# Patient Record
Sex: Female | Born: 1935
Health system: Southern US, Community
[De-identification: ages and names within clinical notes are randomized; demographics above are authoritative.]

## PROBLEM LIST (undated history)

## (undated) DIAGNOSIS — E039 Hypothyroidism, unspecified: Secondary | ICD-10-CM

## (undated) DIAGNOSIS — R569 Unspecified convulsions: Secondary | ICD-10-CM

## (undated) DIAGNOSIS — M1990 Unspecified osteoarthritis, unspecified site: Secondary | ICD-10-CM

## (undated) DIAGNOSIS — E0590 Thyrotoxicosis, unspecified without thyrotoxic crisis or storm: Secondary | ICD-10-CM

## (undated) DIAGNOSIS — G253 Myoclonus: Secondary | ICD-10-CM

## (undated) DIAGNOSIS — M6282 Rhabdomyolysis: Secondary | ICD-10-CM

## (undated) DIAGNOSIS — J189 Pneumonia, unspecified organism: Secondary | ICD-10-CM

## (undated) DIAGNOSIS — G43909 Migraine, unspecified, not intractable, without status migrainosus: Secondary | ICD-10-CM

## (undated) DIAGNOSIS — R269 Unspecified abnormalities of gait and mobility: Secondary | ICD-10-CM

## (undated) DIAGNOSIS — I10 Essential (primary) hypertension: Secondary | ICD-10-CM

## (undated) DIAGNOSIS — I2510 Atherosclerotic heart disease of native coronary artery without angina pectoris: Secondary | ICD-10-CM

## (undated) DIAGNOSIS — M47817 Spondylosis without myelopathy or radiculopathy, lumbosacral region: Secondary | ICD-10-CM

## (undated) DIAGNOSIS — E079 Disorder of thyroid, unspecified: Secondary | ICD-10-CM

## (undated) DIAGNOSIS — I639 Cerebral infarction, unspecified: Secondary | ICD-10-CM

## (undated) DIAGNOSIS — E785 Hyperlipidemia, unspecified: Secondary | ICD-10-CM

## (undated) DIAGNOSIS — I219 Acute myocardial infarction, unspecified: Secondary | ICD-10-CM

## (undated) DIAGNOSIS — Z8673 Personal history of transient ischemic attack (TIA), and cerebral infarction without residual deficits: Secondary | ICD-10-CM

## (undated) DIAGNOSIS — G9341 Metabolic encephalopathy: Secondary | ICD-10-CM

## (undated) DIAGNOSIS — Z923 Personal history of irradiation: Secondary | ICD-10-CM

## (undated) DIAGNOSIS — E669 Obesity, unspecified: Secondary | ICD-10-CM

## (undated) DIAGNOSIS — N3091 Cystitis, unspecified with hematuria: Secondary | ICD-10-CM

## (undated) HISTORY — DX: Personal history of irradiation: Z92.3

## (undated) HISTORY — PX: BACK SURGERY: SHX140

## (undated) HISTORY — DX: Atherosclerotic heart disease of native coronary artery without angina pectoris: I25.10

## (undated) HISTORY — DX: Thyrotoxicosis, unspecified without thyrotoxic crisis or storm: E05.90

## (undated) HISTORY — DX: Myoclonus: G25.3

## (undated) HISTORY — DX: Hyperlipidemia, unspecified: E78.5

## (undated) HISTORY — DX: Spondylosis without myelopathy or radiculopathy, lumbosacral region: M47.817

## (undated) HISTORY — DX: Personal history of transient ischemic attack (TIA), and cerebral infarction without residual deficits: Z86.73

## (undated) HISTORY — DX: Unspecified abnormalities of gait and mobility: R26.9

## (undated) HISTORY — DX: Unspecified convulsions: R56.9

## (undated) HISTORY — DX: Obesity, unspecified: E66.9

## (undated) HISTORY — PX: APPENDECTOMY: SHX54

## (undated) HISTORY — DX: Essential (primary) hypertension: I10

## (undated) HISTORY — PX: LUMBAR DISC SURGERY: SHX700

## (undated) HISTORY — PX: CATARACT EXTRACTION W/ INTRAOCULAR LENS  IMPLANT, BILATERAL: SHX1307

## (undated) HISTORY — DX: Disorder of thyroid, unspecified: E07.9

## (undated) HISTORY — PX: TONSILLECTOMY: SUR1361

---

## 1970-06-28 HISTORY — PX: ABDOMINAL HYSTERECTOMY: SHX81

## 1994-06-28 HISTORY — PX: BREAST BIOPSY: SHX20

## 1995-11-28 ENCOUNTER — Encounter: Payer: Self-pay | Admitting: Internal Medicine

## 1997-06-28 HISTORY — PX: OVARY SURGERY: SHX727

## 1997-09-30 ENCOUNTER — Encounter: Payer: Self-pay | Admitting: Internal Medicine

## 1998-11-18 ENCOUNTER — Encounter: Payer: Self-pay | Admitting: Specialist

## 1998-11-18 ENCOUNTER — Ambulatory Visit (HOSPITAL_COMMUNITY): Admission: RE | Admit: 1998-11-18 | Discharge: 1998-11-18 | Payer: Self-pay | Admitting: Specialist

## 1998-12-03 ENCOUNTER — Ambulatory Visit (HOSPITAL_COMMUNITY): Admission: RE | Admit: 1998-12-03 | Discharge: 1998-12-03 | Payer: Self-pay | Admitting: Specialist

## 1998-12-03 ENCOUNTER — Encounter: Payer: Self-pay | Admitting: Specialist

## 1998-12-10 ENCOUNTER — Encounter: Payer: Self-pay | Admitting: Specialist

## 1998-12-10 ENCOUNTER — Ambulatory Visit (HOSPITAL_COMMUNITY): Admission: RE | Admit: 1998-12-10 | Discharge: 1998-12-10 | Payer: Self-pay | Admitting: Specialist

## 1999-01-13 ENCOUNTER — Encounter: Payer: Self-pay | Admitting: Specialist

## 1999-01-15 ENCOUNTER — Encounter: Payer: Self-pay | Admitting: Specialist

## 1999-01-15 ENCOUNTER — Inpatient Hospital Stay (HOSPITAL_COMMUNITY): Admission: RE | Admit: 1999-01-15 | Discharge: 1999-01-17 | Payer: Self-pay | Admitting: Specialist

## 1999-10-08 ENCOUNTER — Encounter: Admission: RE | Admit: 1999-10-08 | Discharge: 1999-10-08 | Payer: Self-pay | Admitting: Internal Medicine

## 1999-10-08 ENCOUNTER — Encounter: Payer: Self-pay | Admitting: Internal Medicine

## 1999-11-09 ENCOUNTER — Other Ambulatory Visit: Admission: RE | Admit: 1999-11-09 | Discharge: 1999-11-09 | Payer: Self-pay | Admitting: Orthopedic Surgery

## 2000-04-20 ENCOUNTER — Ambulatory Visit (HOSPITAL_COMMUNITY): Admission: RE | Admit: 2000-04-20 | Discharge: 2000-04-20 | Payer: Self-pay | Admitting: Specialist

## 2000-04-20 ENCOUNTER — Encounter: Payer: Self-pay | Admitting: Specialist

## 2000-10-10 ENCOUNTER — Encounter: Admission: RE | Admit: 2000-10-10 | Discharge: 2000-10-10 | Payer: Self-pay | Admitting: Internal Medicine

## 2000-10-10 ENCOUNTER — Encounter: Payer: Self-pay | Admitting: Internal Medicine

## 2001-01-15 ENCOUNTER — Emergency Department (HOSPITAL_COMMUNITY): Admission: EM | Admit: 2001-01-15 | Discharge: 2001-01-15 | Payer: Self-pay | Admitting: *Deleted

## 2001-04-29 ENCOUNTER — Encounter: Payer: Self-pay | Admitting: Internal Medicine

## 2001-04-29 ENCOUNTER — Encounter: Admission: RE | Admit: 2001-04-29 | Discharge: 2001-04-29 | Payer: Self-pay | Admitting: Internal Medicine

## 2001-05-04 ENCOUNTER — Encounter: Payer: Self-pay | Admitting: Internal Medicine

## 2001-05-04 ENCOUNTER — Encounter: Admission: RE | Admit: 2001-05-04 | Discharge: 2001-05-04 | Payer: Self-pay | Admitting: Internal Medicine

## 2001-10-10 ENCOUNTER — Encounter: Admission: RE | Admit: 2001-10-10 | Discharge: 2001-10-10 | Payer: Self-pay | Admitting: Internal Medicine

## 2001-10-10 ENCOUNTER — Encounter: Payer: Self-pay | Admitting: Internal Medicine

## 2002-10-18 ENCOUNTER — Encounter: Payer: Self-pay | Admitting: Internal Medicine

## 2002-10-18 ENCOUNTER — Encounter: Admission: RE | Admit: 2002-10-18 | Discharge: 2002-10-18 | Payer: Self-pay | Admitting: Internal Medicine

## 2002-11-09 ENCOUNTER — Encounter: Payer: Self-pay | Admitting: Internal Medicine

## 2002-11-09 ENCOUNTER — Ambulatory Visit (HOSPITAL_COMMUNITY): Admission: RE | Admit: 2002-11-09 | Discharge: 2002-11-09 | Payer: Self-pay | Admitting: Internal Medicine

## 2003-04-22 ENCOUNTER — Ambulatory Visit (HOSPITAL_COMMUNITY): Admission: RE | Admit: 2003-04-22 | Discharge: 2003-04-22 | Payer: Self-pay | Admitting: Unknown Physician Specialty

## 2003-05-29 ENCOUNTER — Inpatient Hospital Stay (HOSPITAL_COMMUNITY): Admission: RE | Admit: 2003-05-29 | Discharge: 2003-05-31 | Payer: Self-pay | Admitting: Specialist

## 2003-10-04 ENCOUNTER — Ambulatory Visit (HOSPITAL_COMMUNITY): Admission: RE | Admit: 2003-10-04 | Discharge: 2003-10-04 | Payer: Self-pay | Admitting: Specialist

## 2003-10-17 ENCOUNTER — Encounter (HOSPITAL_COMMUNITY): Admission: RE | Admit: 2003-10-17 | Discharge: 2003-10-21 | Payer: Self-pay | Admitting: Specialist

## 2003-10-23 ENCOUNTER — Encounter: Admission: RE | Admit: 2003-10-23 | Discharge: 2003-10-23 | Payer: Self-pay | Admitting: Internal Medicine

## 2003-11-27 ENCOUNTER — Ambulatory Visit (HOSPITAL_COMMUNITY): Admission: RE | Admit: 2003-11-27 | Discharge: 2003-11-27 | Payer: Self-pay | Admitting: *Deleted

## 2003-11-27 HISTORY — PX: COLONOSCOPY: SHX174

## 2003-12-08 ENCOUNTER — Inpatient Hospital Stay (HOSPITAL_COMMUNITY): Admission: EM | Admit: 2003-12-08 | Discharge: 2003-12-12 | Payer: Self-pay | Admitting: Emergency Medicine

## 2004-03-09 ENCOUNTER — Encounter: Admission: RE | Admit: 2004-03-09 | Discharge: 2004-03-09 | Payer: Self-pay | Admitting: Orthopaedic Surgery

## 2004-03-28 HISTORY — PX: ANTERIOR CERVICAL DECOMP/DISCECTOMY FUSION: SHX1161

## 2004-04-22 ENCOUNTER — Inpatient Hospital Stay (HOSPITAL_COMMUNITY): Admission: RE | Admit: 2004-04-22 | Discharge: 2004-04-24 | Payer: Self-pay | Admitting: Orthopaedic Surgery

## 2004-10-29 ENCOUNTER — Encounter: Admission: RE | Admit: 2004-10-29 | Discharge: 2004-10-29 | Payer: Self-pay | Admitting: Internal Medicine

## 2004-11-17 ENCOUNTER — Ambulatory Visit (HOSPITAL_COMMUNITY): Admission: RE | Admit: 2004-11-17 | Discharge: 2004-11-17 | Payer: Self-pay | Admitting: Ophthalmology

## 2005-11-01 ENCOUNTER — Encounter: Admission: RE | Admit: 2005-11-01 | Discharge: 2005-11-01 | Payer: Self-pay | Admitting: Internal Medicine

## 2005-12-20 ENCOUNTER — Encounter: Admission: RE | Admit: 2005-12-20 | Discharge: 2005-12-20 | Payer: Self-pay | Admitting: Neurology

## 2005-12-26 DIAGNOSIS — I219 Acute myocardial infarction, unspecified: Secondary | ICD-10-CM

## 2005-12-26 HISTORY — DX: Acute myocardial infarction, unspecified: I21.9

## 2006-01-01 ENCOUNTER — Inpatient Hospital Stay (HOSPITAL_COMMUNITY): Admission: EM | Admit: 2006-01-01 | Discharge: 2006-01-04 | Payer: Self-pay | Admitting: Emergency Medicine

## 2006-01-03 HISTORY — PX: CARDIAC CATHETERIZATION: SHX172

## 2006-06-27 ENCOUNTER — Encounter: Admission: RE | Admit: 2006-06-27 | Discharge: 2006-06-27 | Payer: Self-pay | Admitting: Neurology

## 2006-07-25 ENCOUNTER — Encounter: Admission: RE | Admit: 2006-07-25 | Discharge: 2006-07-25 | Payer: Self-pay | Admitting: Neurology

## 2006-08-10 ENCOUNTER — Encounter: Admission: RE | Admit: 2006-08-10 | Discharge: 2006-08-10 | Payer: Self-pay | Admitting: Neurology

## 2006-08-30 ENCOUNTER — Encounter: Admission: RE | Admit: 2006-08-30 | Discharge: 2006-08-30 | Payer: Self-pay | Admitting: Neurology

## 2006-09-21 ENCOUNTER — Ambulatory Visit (HOSPITAL_COMMUNITY): Admission: RE | Admit: 2006-09-21 | Discharge: 2006-09-21 | Payer: Self-pay | Admitting: Internal Medicine

## 2006-09-27 HISTORY — PX: ESOPHAGOGASTRODUODENOSCOPY: SHX1529

## 2006-10-06 ENCOUNTER — Ambulatory Visit (HOSPITAL_COMMUNITY): Admission: RE | Admit: 2006-10-06 | Discharge: 2006-10-06 | Payer: Self-pay | Admitting: *Deleted

## 2006-10-06 ENCOUNTER — Encounter: Payer: Self-pay | Admitting: Internal Medicine

## 2006-10-06 ENCOUNTER — Encounter (INDEPENDENT_AMBULATORY_CARE_PROVIDER_SITE_OTHER): Payer: Self-pay | Admitting: Specialist

## 2006-11-17 ENCOUNTER — Encounter: Admission: RE | Admit: 2006-11-17 | Discharge: 2006-11-17 | Payer: Self-pay | Admitting: Internal Medicine

## 2007-11-21 ENCOUNTER — Encounter: Admission: RE | Admit: 2007-11-21 | Discharge: 2007-11-21 | Payer: Self-pay | Admitting: Internal Medicine

## 2007-12-01 ENCOUNTER — Encounter: Admission: RE | Admit: 2007-12-01 | Discharge: 2007-12-01 | Payer: Self-pay | Admitting: Internal Medicine

## 2008-01-24 ENCOUNTER — Encounter: Admission: RE | Admit: 2008-01-24 | Discharge: 2008-01-24 | Payer: Self-pay | Admitting: Endocrinology

## 2008-04-12 ENCOUNTER — Ambulatory Visit (HOSPITAL_COMMUNITY): Admission: RE | Admit: 2008-04-12 | Discharge: 2008-04-12 | Payer: Self-pay | Admitting: Endocrinology

## 2008-11-20 ENCOUNTER — Ambulatory Visit (HOSPITAL_COMMUNITY): Admission: RE | Admit: 2008-11-20 | Discharge: 2008-11-20 | Payer: Self-pay | Admitting: Endocrinology

## 2008-11-21 ENCOUNTER — Encounter: Admission: RE | Admit: 2008-11-21 | Discharge: 2008-11-21 | Payer: Self-pay | Admitting: Internal Medicine

## 2009-02-27 ENCOUNTER — Ambulatory Visit: Payer: Self-pay | Admitting: Internal Medicine

## 2009-02-27 DIAGNOSIS — K5900 Constipation, unspecified: Secondary | ICD-10-CM | POA: Insufficient documentation

## 2009-02-27 DIAGNOSIS — Z860100 Personal history of colon polyps, unspecified: Secondary | ICD-10-CM | POA: Insufficient documentation

## 2009-11-25 ENCOUNTER — Encounter: Admission: RE | Admit: 2009-11-25 | Discharge: 2009-11-25 | Payer: Self-pay | Admitting: Internal Medicine

## 2010-04-06 ENCOUNTER — Ambulatory Visit: Payer: Self-pay | Admitting: Cardiovascular Disease

## 2010-07-21 ENCOUNTER — Encounter (HOSPITAL_COMMUNITY)
Admission: RE | Admit: 2010-07-21 | Discharge: 2010-07-28 | Payer: Self-pay | Source: Home / Self Care | Attending: Endocrinology | Admitting: Endocrinology

## 2010-07-27 ENCOUNTER — Other Ambulatory Visit (HOSPITAL_COMMUNITY): Payer: Self-pay | Admitting: Endocrinology

## 2010-07-27 DIAGNOSIS — E0590 Thyrotoxicosis, unspecified without thyrotoxic crisis or storm: Secondary | ICD-10-CM

## 2010-08-03 ENCOUNTER — Ambulatory Visit (HOSPITAL_COMMUNITY)
Admission: RE | Admit: 2010-08-03 | Discharge: 2010-08-03 | Disposition: A | Discharge status: Home / Self Care | Payer: Medicare Other | Source: Ambulatory Visit | Attending: Endocrinology | Admitting: Endocrinology

## 2010-08-03 DIAGNOSIS — E0590 Thyrotoxicosis, unspecified without thyrotoxic crisis or storm: Secondary | ICD-10-CM | POA: Insufficient documentation

## 2010-08-03 MED ORDER — SODIUM IODIDE I 131 CAPSULE
32.0000 | Freq: Once | INTRAVENOUS | Status: AC | PRN
  Administered 2010-08-03: 32 via ORAL

## 2010-10-12 ENCOUNTER — Other Ambulatory Visit: Payer: Self-pay | Admitting: *Deleted

## 2010-10-12 DIAGNOSIS — I2510 Atherosclerotic heart disease of native coronary artery without angina pectoris: Secondary | ICD-10-CM

## 2010-10-12 MED ORDER — ISOSORBIDE MONONITRATE ER 60 MG PO TB24: 60.0000 mg | ORAL_TABLET | ORAL | Status: DC

## 2010-10-20 ENCOUNTER — Other Ambulatory Visit: Payer: Self-pay | Admitting: *Deleted

## 2010-10-20 DIAGNOSIS — I10 Essential (primary) hypertension: Secondary | ICD-10-CM

## 2010-10-20 MED ORDER — METOPROLOL TARTRATE 100 MG PO TABS: 100.0000 mg | ORAL_TABLET | Freq: Two times a day (BID) | ORAL | Status: DC

## 2010-10-20 NOTE — Telephone Encounter (Signed)
escribe medication per fax request  

## 2010-10-27 ENCOUNTER — Other Ambulatory Visit: Payer: Self-pay | Admitting: Internal Medicine

## 2010-10-27 DIAGNOSIS — Z1231 Encounter for screening mammogram for malignant neoplasm of breast: Secondary | ICD-10-CM

## 2010-11-02 ENCOUNTER — Other Ambulatory Visit: Payer: Self-pay | Admitting: Cardiovascular Disease

## 2010-11-03 NOTE — Telephone Encounter (Signed)
Fax received from pharmacy. Refill completed. Jodette Jewett Mcgann RN  

## 2010-11-13 NOTE — H&P (Signed)
NAMEMarland Bass  ANALYS, RYDEN                    ACCOUNT NO.:  [id number]   MEDICAL RECORD NO.:  [id number]                   PATIENT TYPE:  INP   LOCATION:  1843                                 FACILITY:  [facility]   PHYSICIAN:  Hettie Holstein, D.O.                 DATE OF BIRTH:  07-Feb-1926   DATE OF ADMISSION:  12/08/2003  DATE OF DISCHARGE:                                HISTORY & PHYSICAL   PRIMARY CARE PHYSICIAN:  Soyla Murphy. Kimberly Bass, M.D.   CHIEF COMPLAINT:  Can't stand and feeling lightheaded.   HISTORY OF PRESENT ILLNESS:  This is a pleasant 77 year old female with a  history of familial tremor as well severe degenerative spine disease who,  for the past couple of days, has been feeling unwell.  This morning she  awoke feeling nauseated and lightheaded.  She did not vomit; however, she  stood up and felt very weak at the knees and fell to the floor without loss  of consciousness.  Upon transfer to the bed with the assistance of her  husband, she noted some uncontrollable shaking of her legs.  She stated,  almost shook me out of bed.  EMS was dispatched and found her to be  hypertensive with blood pressure 210/110 and subsequently transported her to  [facility] Emergency Department for further evaluation.  She  arrived, ER physician evaluated her, and because she could not stand, called  [place] to evaluate her.  In the emergency department on the ISTAT, she was noted  to have a potassium of 3.2.  No other labs available at the time of this  evaluation and dictation.  In any event, she reported two days of feeling  generally unwell, some nausea without emesis.  No diarrhea or dysuria.  She  had no chest pain or shortness of breath.  No other complaints.  She  reported 30-pound weight loss over the past three months.  This has not been  addressed.   Recently she was treated by Dr. Otelia Sergeant for severe degenerative spine disease.  She underwent back surgery in December 2004 and  subsequently has been taking  muscle relaxants and Sulindac or Clinoril in addition to  hydrochlorothiazide.   She does report a tremor that she has had for years that runs in her family.  She states that she has been evaluated thoroughly by a neurologist in the  past for this; however, she states that these lower extremity shakes and  jerks are completely new to her.   PAST MEDICAL HISTORY:  She denies any known coronary artery disease.  In  fact, she states she had undergone evaluation by [facility] several  years ago with stress test and echocardiogram; however, these are not  available in the chart at this time.  She denies any lung disease, life time  nonsmoker.  Her medical history is significant for longstanding  hypertension.  She does as well have history  of severe degenerative back  disease with recent back surgery in December 2004 by Dr. Otelia Sergeant.  She had  MRIs performed of her T spine as well as L spine in October 2004 revealing  C3 through C7 spinal stenosis as well as L1-L2 and L2-L3 spondylosis as well  as advanced facet degeneration and L4-5 foraminal stenosis.  This was prior  to her operation per Dr. Otelia Sergeant.  She has a history of benign left ovary  tumor resected in 1999 in addition to bilateral salpingo-oophorectomy  performed at that time.  She has had breast nodule in 1996 with right breast  biopsy.  She states that this was benign.  She has undergone hysterectomy in  1972 for uterine fibroid.  She retains her gallbladder; however, she has had  appendectomy as well as tonsillectomy.   MEDICATIONS AT HOME:  1. Atenolol 100 mg p.o. q.a.m., 50 mg p.o. q.p.m.  2. Lisinopril 40 mg p.o. daily.  3. Calcium supplements.  4. Methocarbamol started in December 500 mg t.i.d.  5. Trazodone 500 mg 2 tablets q.h.s.  6. Sulindac 200 mg p.o. b.i.d.  7. Lipitor 10 mg p.o. daily.  8. Hydrochlorothiazide 12.5 mg p.o. daily  9. Multivitamins 1 p.o. daily.   ALLERGIES:  She  states she is allergic to PENICILLIN which causes  anaphylaxis.  She is intolerant to CODEINE which causes nausea in addition  to other narcotic pain medications.   SOCIAL HISTORY:  She is a lifetime nonsmoker.  She lives with her husband.  She has one child.  Recently placed her mother in a nursing home  approximately two months ago which has been quite stressful to her.  Her  mother is 9 years old.   FAMILY HISTORY:  Her mother is alive at 42 years old, recently placed in a  nursing home.  Father is 39 years old and had heart disease in late 47s.  No known cancer in the family.   REVIEW OF SYSTEMS:  No fevers or chills, no night sweats.  Positive nausea,  no vomiting, no coffee ground emesis, no hematochezia or melena.  She has  positive subjective weight loss of approximately 30 pounds over the past  three months.  This has not been addressed.  She has had endoscopies  performed by Dr. Virginia Rochester November 27, 2003, which revealed no colon polyps and  recommendations to followup in 5 to 10 years.  She has had no complaints of  dysuria, diarrhea.  No lower extremity swelling or weakness with exception  of that which is described in the History of Present Illness.   PHYSICAL EXAMINATION:  VITAL SIGNS:  Blood pressure 172/76, heart rate 72,  temperature 97.4, O2 saturation 96%.  GENERAL:  Alert and oriented in no acute distress.  She does have postural  low to moderate-amplitude, low-frequency tremor, about 2 Hz that is most  notable with outstretched hands or turning her neck in fixed position to one  side or the other.  Again, the family reports that this is not new.  HEENT:  Head is normocephalic and atraumatic.  Extraocular muscles are  intact.  Oropharynx is clear.  No temporal wasting is noted.  NECK: Supple, nontender.  No palpable thyromegaly, no palpable  lymphadenopathy.  CARDIOVASCULAR:  Normal S1 and S2 without murmur, rub, or gallop. LUNGS:  Clear to auscultation bilaterally.   ABDOMEN:  Soft and nontender.  Normoactive bowel sounds.  EXTREMITIES:  No edema, no calf tenderness.  She does exhibit random jerks  of her lower extremities when strength testing is undertaken.  NEUROLOGIC:  Myoclonic jerks with muscle stretch.  Her deep tendon reflexes,  however, are within normal limits at +2/4 x 4.  Her strength is +5/5 x 4  with regard to proximal musculature.  However, she is unable to stand due to  being very wobbly in her knees and these recurrent myoclonic jerks.   LABORATORY AND X-RAY DATA:  Point-of-care at 12:23 was negative.  Urinalysis  is negative.  Hemoglobin and hematocrit are normal at 13.9 and 41,  respectively.  These are all taken from the ISTAT drawn in the emergency  department.  Sodium 143, potassium 3.2, BUN 12, creatinine 0.6.   Review of recent imaging studies not performed this admission, however,  reveal MRI study in October 2004, as reported above of her L and C spines as  well as recent hip MRI that revealed some degenerative disease and labral  tear.  This was reviewed by Dr. Otelia Sergeant.   EKG this admission revealed normal sinus rhythm with left axis deviation.   IMPRESSION AND PLAN:  1. Near syncopal event.  Not completely clear as to the etiology by the     history.  She did report some lightheadedness.  Unable to check     orthostatics due to her inability to really stand in the emergency     department.  We will administer gentle IV fluids as she appears     clinically mildly volume depleted.  I will check a formal comprehensive     metabolic panel with magnesium, calcium, and phosphorus to assess for any     electrolyte abnormality that may be contributing to these myoclonic     jerks.  We will check her orthostatics once she is able to stand for [place]     and ambulate her in the hallways with physical therapy.  Will consult     physical therapy in the morning.  2. Myoclonic jerks.  It is unclear as to etiology of this.  It may be      related to electrolyte abnormality.  We are going to check additionally     magnesium and calcium.  We are going to replete her potassium and follow     her electrolytes.  If she continues to have these myoclonic jerks, the     assistance of neurology may be reasonable.  We will withhold her     methocarbamol for now and hold her hydrochlorothiazide as well as calcium     supplements until we are able to review labs.  3. Hypertension.  She is currently on high doses of atenolol as well as     Lisinopril. We will consider increasing her Lisinopril; however, she is     already at 40.  For now, while we are holding her hydrochlorothiazide, I     will place her on p.r.n. hydralazine and will plan on increasing her     Lisinopril if she requires it.  4. Spinal degenerative disease.  At this time, she is stable with regard to     her recent surgery and has no pain or complaints in that regard.  Will    follow her off of her muscle relaxants as well as pain medication for     now.  We will initiate prophylactic low-molecular-weight heparin while     she is immobile.  Will follow her on telemetry for possible arrhythmia     for the next 12 to  24 hours.  If there is none and her symptoms resolve,     she can probably go home.                                                Hettie Holstein, D.O.    ESS/MEDQ  D:  12/08/2003  T:  12/08/2003  Job:  [id number]   cc:   Soyla Murphy. Kimberly Bass, M.D.  [address]  [place]  [place] [address]  Fax: [phone]

## 2010-11-13 NOTE — Discharge Summary (Signed)
NAMEMarland Kitchen  Kimberly Bass, Kimberly Bass                    ACCOUNT NO.:  [id number]   MEDICAL RECORD NO.:  [id number]                   PATIENT TYPE:  INP   LOCATION:  5157                                 FACILITY:  [facility]   PHYSICIAN:  Hettie Holstein, D.O.                 DATE OF BIRTH:  1926/04/12   DATE OF ADMISSION:  12/08/2003  DATE OF DISCHARGE:  12/12/2003                                 DISCHARGE SUMMARY   PRIMARY CARE PHYSICIAN:  Kimberly Bass, M.D.   SPINE SURGEON:  Kerrin Champagne, M.D.   NEUROLOGIST:  Consultation this admission, C. Lesia Sago, M.D.   ADMISSION DIAGNOSES:  1. Questionable near-syncopal event.  2. Myoclonic lower extremity jerks and inability to ambulate.  3. Hypertension.  4. Spinal degenerative disease.   DISCHARGE DIAGNOSES:  1. Myoclonic jerks of the lower extremity, complete etiology unclear, status     post evaluation by neurology and discussion with orthopedic spine     surgeon, with further recommendations for orthopedic spine follow-up with     Dr. Otelia Sergeant on June 22 at 10:30 a.m., status post MRI evaluation this     admission that revealed multilevel severe degenerative disk disease     extending from C3-4 through C6-7 with soft disk herniations at each of     those levels, most prominent C6-7.  The cord was slightly compressed at     C5-6 and C6-7 without underlying myelopathy.  There is multilevel     biforaminal stenosis due to spurs at those levels.  This was conveyed to     orthopedic spine surgeon covering for Dr. Otelia Sergeant on December 11, 2003, and it     was recommended that Dr. Otelia Sergeant see her in follow-up.  An appointment was     scheduled.  2. In addition, she had hypokalemia with admitting potassium of 3.0 and     subsequently replaced, in addition to low normal magnesium, also repleted     this admission.  3. Hyperthyroidism with finding of a low thyroid-stimulating hormone,     elevated T3 and T4.  No overt evidence of exophthalmos noted this  admission.  She is on high doses of a beta blocker, atenolol 100 mg p.o.     b.i.d.  She underwent radionuclide scan and we are currently awaiting the     findings of this test and the treatment prior to discharge home.  It is     recommended that she undergo follow-up with her primary care physician,     Dr. Renne Bass, and if he should wish, he can schedule her to follow up with     an endocrinologist, Dr. Lucianne Muss, who was contacted during his admission.     She will be discharged on PTU and have subsequent follow-up within one     week with her primary care physician.  She will start with 50 mg once     daily.  4.  Near-syncope.  After a thorough questioning, it sounds as though Ms.     Jamison was mildly volume-depleted; however, mainly this was related to     these myoclonic jerks of her lower extremities that were felt to be     uncontrolled.  It was suggested that she may have restless legs by the     neurologist here.  In addition, it may be related to her hyperthyroidism,     in addition to, may be related to her progressed severe spinal     degenerative disease.  5. Hypertension.  6. Degenerative spine disease with recent back surgery in December 2004 by     Dr. Otelia Sergeant, status post MRIs performed of her cervical spine and lumbar     spine in October revealing C3 through C7 spinal stenosis as well as L1-2     and L2-3 spondylosis, as well as advanced facet degeneration and L4-5     foraminal stenosis.  7. She has a history of a benign left ovary resected in 1999 with bilateral     salpingo-oophorectomy.  8. She had a breast nodule in 1996 with a right breast biopsy, which was     benign.  9. Status post hysterectomy in 1972 for uterine fibroids.  10.      She retains her gallbladder; however, she has had an appendectomy     as well as tonsillectomy.   DISCHARGE MEDICATIONS:  1. Atenolol has been increased from 100 to 100 mg twice daily, as this may     be titrated down as her  hyperthyroidism is treated.  2. Lisinopril 40 mg daily.  3. Calcium supplements as before.  4. Multivitamin as before.  5. Trazodone only as needed.  6. She is instructed to stop methocarbamol.  7. She is instructed to continue taking Clinoril twice daily and resume     calcium supplements.  8. Propylthiouracil 50 mg daily and re-evaluation in the outpatient setting.   She is instructed to be assured supervision for the next few days as she is  a risk for falls, and she is instructed to take things slowly and be very  careful and informed of the risks and complications associated with falls at  her age.   DISPOSITION:  She is being discharged home in stable and improved condition.  She is ambulating the halls without difficulty.  She has a follow-up  appointment on June 22, Wednesday, at 10:30 with Dr. Otelia Sergeant as well as June  23, Thursday, at 11:15 a.m. with Dr. Ricki Miller as Dr. Renne Bass will be on vacation.   HISTORY OF PRESENT ILLNESS:  This is a pleasant 77 year old female with a  history of familial tremor as well as severe degenerative spine disease, who  for the past couple of days has been feeling unwell.  The morning of  presentation she awoke feeling nauseated and lightheaded.  She did not  vomit; however, she stood up and felt very weak at the knees and fell to the  floor without loss of consciousness.  Following transfer to the bed with the  assistance of her husband, she noticed some uncontrollable shaking of her  legs.  She stated, these almost shook me out of bed.  EMS was dispatched  and found her to be hypertensive with a blood pressure of 210/110, and she  was subsequently transported to [facility] ER for further evaluation.  She  arrived.  The ER physician evaluated her and because she could not stand,  we  were called to evaluate her.  In the emergency department ISTAT, she was  noted to be hypokalemic with a potassium of 3.2.  No other labs were available at that time.  She  was admitted for further observation and  evaluation.  There was no suggestion of ischemia due to cardiac etiology  based on her clinical history, and she was given some gentle IV fluids.  She  exhibited no orthostasis.   HOSPITAL COURSE:  She was admitted as noted above.  No acute cardiac event  was clinically suspected.  She was followed closely.  Her electrolytes were  repleted, and hypertensive medications were up-titrated.  She was screened  for thyroid, which revealed a depressed TSH, elevated T3 and T4, and  subsequently nuclear medicine was consulted for RAI and she is anticipated  treatment today; however, she has not gone down for that yet.  I suspect  that if this goes without complication, she can go home today.  She has been  ambulating without difficulty here.  She continued to have the myoclonic  jerks.  She was evaluated by neurology, who recommended a referral  appropriate in the outpatient setting with her spine surgeons.  Dr. Barbaraann Faster  office was notified and otherwise she should follow up as noted above.                                                Hettie Holstein, D.O.    ESS/MEDQ  D:  12/12/2003  T:  12/12/2003  Job:  [id number]   cc:   Kimberly Bass, M.D.  [address]  [place]  [place] [address]  Fax: [phone]   Kerrin Champagne, M.D.  [address]  [place]  [place] [address]  Fax: [phone]   C. Lesia Sago, M.D.  [address]  [address]  [place]  [place] [address]  Fax: [phone]

## 2010-11-13 NOTE — Cardiovascular Report (Signed)
NAMEISHA, SEEFELD NO.:  [id number]   MEDICAL RECORD NO.:  [id number]          PATIENT TYPE:  INP   LOCATION:  2035                         FACILITY:  [facility]   PHYSICIAN:  Vesta Mixer, M.D. DATE OF BIRTH:  04/06/1926   DATE OF PROCEDURE:  01/03/2006  DATE OF DISCHARGE:                              CARDIAC CATHETERIZATION   Kimberly Bass is a 77 year old female who was admitted through the  emergency room with some episodes of chest pain and mildly positive cardiac  enzymes.  She is referred for heart catheterization for further evaluation.   PROCEDURE:  Left heart catheterization with coronary angiography.   HEMODYNAMIC RESULTS:  The LV pressure is 129/8 with an aortic pressure of  126/61.   Angiography, left main.  The left main is fairly smooth and normal.   The left anterior descending artery is smooth and normal.  The distal LAD  tapers but there is no discrete stenosis.   There are several very small diagonal vessels normal which are essentially  normal.   The left circumflex artery is a large vessel.  It gives off a large first  obtuse marginal artery which is normal.  The distal circumflex terminates as  a moderate to large size posterior lateral branch.  At the distal aspect of  this posterolateral branch, the vessel becomes very tortuous and has a  corkscrew pattern.  Throughout this area, there is a diffuse 80-90%  stenosis.  The last 20 cm of this branch the vessel opens back up to most  likely is normal size.  This lesion did not improve with intracoronary  nitroglycerin.   The right coronary artery is large and dominant.  There are no stenoses.  The posterior descending artery and the posterolateral segment artery are  normal.   The left ventriculogram was performed in a 30 RAO position.  It reveals  supranormal left ventricular systolic function.  The ejection fraction was  around 75 at 80%.  There is no significant mitral  regurgitation.   COMPLICATIONS:  None.   CONCLUSION:  Single-vessel coronary artery disease.  She has a stenosis in  the distal aspect of the circumflex posterolateral branch.  This lesion is  quite long and is very diffusely diseased.  In addition, it has a corkscrew  pattern  and some degree of it may be intramuscular.  This is most consistent with a  hypertensive heart.  This lesion is not amenable to angioplasty or stenting.  We will continue with aggressive medical therapy including blood pressure  control, beta blockers and some nitrates.  She has normal left ventricular  systolic function.           ______________________________  Vesta Mixer, M.D.     PJN/MEDQ  D:  01/03/2006  T:  01/03/2006  Job:  [id number]   cc:   Soyla Murphy. [name], M.D.  Fax: [phone]

## 2010-11-13 NOTE — Op Note (Signed)
NAMEJAMIE, Kimberly Bass NO.:  [id number]   MEDICAL RECORD NO.:  [id number]          PATIENT TYPE:  AMB   LOCATION:  ENDO                         FACILITY:  [facility]   PHYSICIAN:  Georgiana Spinner, M.D.    DATE OF BIRTH:  04-20-1926   DATE OF PROCEDURE:  DATE OF DISCHARGE:                               OPERATIVE REPORT   PROCEDURE:  Upper endoscopy.   INDICATIONS:  Abdominal pain.   ANESTHESIA:  Fentanyl 25 mcg, Versed 3 mg.   DESCRIPTION OF PROCEDURE:  With the patient mildly sedated in the left  lateral decubitus position, the Pentax videoscopic endoscope was  inserted in the mouth and passed under direct vision through the  esophagus which appeared normal, into the stomach where the proximal  stomach appeared somewhat irregular in mucosa pattern.  This was  photographed and biopsied.  We entered into the stomach, fundus body  appeared normal.  Antrum showed some mild erythematous changes that were  photographed and biopsied.  Duodenal bulb and second portion of the  duodenum both appeared normal.  From this point, the endoscope was  slowly withdrawn taking circumferential views of duodenal mucosa until  the endoscope had been pulled back into the stomach and placed in  retroflexion to view the stomach from below.  The endoscope was  straightened and withdrawn taking circumferential views of the remaining  gastric and esophageal mucosa.  The patient's vital signs and pulse  oximeter remained stable.  The patient tolerated the procedure well  without apparent complications.   FINDINGS:  Erythema of the antrum and mild irregularity of the proximal  stomach.  Await biopsy reports.  The patient will call me for results  and follow up with me as an outpatient.           ______________________________  Georgiana Spinner, M.D.     GMO/MEDQ  D:  10/06/2006  T:  10/06/2006  Job:  [id number]

## 2010-11-13 NOTE — Consult Note (Signed)
NAME:  ALIA, PARSLEY NO.:  [id number]   MEDICAL RECORD NO.:  [id number]                   PATIENT TYPE:  INP   LOCATION:  4714                                 FACILITY:  [facility]   PHYSICIAN:  Marlan Palau, M.D.               DATE OF BIRTH:  1925-07-16   DATE OF CONSULTATION:  12/09/2003  DATE OF DISCHARGE:                                   CONSULTATION   HISTORY OF PRESENT ILLNESS:  Kimberly Bass is a 77 year old left-handed  white female, born 09-Feb-1926, with a history of problems with  hypertension.  This patient comes in to [facility]  after she began  noting some twitching and jerking of the legs when she tried to walk.  Even  with lying down, she will have twitches and jerks in the legs that are  exacerbated by trying to move the legs.  The patient has had no problems  with the arms.  The patient has had a low back surgery in December 2004 but  was told prior to the surgery that she had a problem in her neck and she  needed to see a neurosurgeon as well.  The patient, however, denies any  problems controlling the bowels or the bladder, has been on some  methocarbamol previously.  Upon admission to the hospital, the patient was  noted to be hyperthyroid.  The patient at this point denies any numbness,  sensation in the legs or arms, denies any pain down into the legs, forearms.  Does have some neck pain on and off.  The patient is still not able to walk  effectively.  Notes that the knees will tend to collapse on her.  Neurology  is asked to see the patient for further evaluation. CT scan of the head on  admission was unremarkable.   PAST MEDICAL HISTORY:  1. New onset of gait disturbance, jerking and twitching of the legs.  2. Hyperthyroid state.  3. Hypertension.  4. Lumbosacral spine surgery, December 2004.  5. History of fibroid tumors, status post hysterectomy.  6. History of cataract surgery, 2002, bilaterally.  7.  History of appendectomy.  8. History of hypercholesterolemia.   ALLERGIES:  PENICILLIN.   HABITS:  Does not smoke or drink.   CURRENT MEDICATIONS:  1. Potassium 40 mEq daily.  2. Magnesium 400 mg daily.  3. Tenormin 100 mg daily in the morning and 50 mg at night.  4. Lisinopril 40 mg daily.  5. Zocor 20 mg daily.  6. Tylenol if needed.  7. Trazodone 50 mg at night if needed.   SOCIAL HISTORY:  This patient lives in the [place] area.  Is married.  Has one son with a history of renal cell carcinoma.   FAMILY HISTORY:  Mother is still living at age 50.  Father died in is 57's  with heart disease.  The patient has two brothers, two sisters living.  Had  two brothers that have passed away; one with complications from alcohol an  drug abuse, one with intracranial hemorrhage.   REVIEW OF SYSTEMS:  No recent fevers or chills.  The patient does have some  neck pain.  Denies shortness of breath, chest pain, nausea, vomiting,  abdominal pain, trouble with controlling the bowels or the bladder, blackout  episodes, or seizures.   PHYSICAL EXAMINATION:  VITAL SIGNS:  Blood pressure 102/55, heart rate 78,  respiratory rate 18, afebrile.  GENERAL:  The patient is a fairly well-developed, elderly, white female who  is alert and cooperative at the time of examination.  HEENT:  Head is atraumatic.  Pupils are equal, round and reactive to light.  Disks are flat.  NECK:  Supple.  No carotid bruits or nodes.  RESPIRATORY:  Clear.  CARDIOVASCULAR:  Regular rate and rhythm.  No obvious murmurs or rubs noted.  EXTREMITIES:  Trace edema at the ankles bilaterally.  NEUROLOGIC:  Cranial nerves as above.  Facial symmetry is present.  The  patient notes good sensation to pinprick on the face bilaterally.  Has full  extraocular movements.  Visual fields are full.  Speech is well enuciated,  __________.  The patient again as good strength of the facial muscles,  muscles of head turning  and shoulder shrug bilaterally.  Motor tests shows  good strength in both upper extremities, some slight weakness of the  iliopsoas muscle on the right is noted.  Otherwise good strength was noted  throughout.  The patient has good pinprick, soft touch __________ sensation  throughout.  The patient has fair finger-nose-finger, toe-to-finger  bilaterally.  The patient is not ambulated.  With leg movement, intermittent  jerks and twitches are noted.  Deep tendon reflexes are fairly symmetric  throughout.  If anything, more brisk in the arms than in the legs.  Equivocal Babinskis are noted bilaterally.   LABORATORY DATA:  Hemoglobin 13.9, hematocrit 41.  Sodium 142, potassium  3.5, chloride 111, CO2 26, BUN 12, creatinine 0.5, glucose 95, SGOT 23, SGPT  23, alk phos 52, total bilirubin 1, calcium 8.8, phosphorus  3.1, magnesium  1.6. Vitamin B12 is pending at this time.  TSH is 0.015.  Her CT scan of the  head has been done and is normal.   IMPRESSION:  1. Gait disturbance, myoclonus or clonus in the legs.  2. Hypothyroid state.  3. Hypertension.   This patient has an unusual problem with twitches in the legs.  The episodes  seem to be activated by attempted movement of the legs.  May represent  clonus or a form of clonus.  I do need to consider and rule out the  possibility of a cervical myelopathy with muscle tone exacerbation that may  have been in part related to the hyperthyroid state.  Will initiate work-up.  B12 levels pending at this point.   PLAN:  1. MRI scan of the cervical spine.  2. Physical therapy evaluation.  3. Treatment with baclofen.   The patient denies a restless leg feeling; otherwise these movements would  be very typical for restless leg syndrome.                                               Marlan Palau, M.D.    CKW/MEDQ  D:  12/09/2003  T:  12/10/2003  Job:  [id number]   cc:   Jonna L. Robb Matar, M.D.

## 2010-11-13 NOTE — Op Note (Signed)
NAMEMarland Bass  LORRINE, [name]                    ACCOUNT NO.:  [id number]   MEDICAL RECORD NO.:  [id number]                   PATIENT TYPE:  AMB   LOCATION:  ENDO                                 FACILITY:  [facility]   PHYSICIAN:  Georgiana Spinner, M.D.                 DATE OF BIRTH:  [birth year]   DATE OF PROCEDURE:  DATE OF DISCHARGE:                                 OPERATIVE REPORT   PROCEDURE:  Colonoscopy.   INDICATIONS:  Colon polyps.   ANESTHESIA:  Fentanyl 100 mcg, Versed 10 mg.   PROCEDURE:  With the patient was mildly sedated in the left lateral  decubitus position with pressure applied, the Olympus videoscopic  colonoscope PCF-100 was inserted into the rectum, passed under direct  visualization into the cecum, identified by the ileocecal valve and  appendiceal orifice, both of which were photographed.  It was a very  difficult examination.  From this point, the colonoscope was then slowly  withdrawn, taking circumferential views of the colonic mucosa, stopping in  the rectum, which appeared normal on direct and showed hemorrhoids with the  retroflexed view.  The endoscope was straightened and withdrawn.  The  patient's vital signs and pulse oximeter remained stable.  The patient  tolerated the procedure well with no apparent complications.   FINDINGS:  Difficult colonoscopic examination to the cecum but negative for  colonic polyps.   PLAN:  Consider repeat examination in 5-10 years.                                               Georgiana Spinner, M.D.    GMO/MEDQ  D:  11/27/2003  T:  11/27/2003  Job:  [id number]

## 2010-11-13 NOTE — Discharge Summary (Signed)
Kimberly Bass, Kimberly Bass NO.:  [id number]   MEDICAL RECORD NO.:  [id number]          PATIENT TYPE:  INP   LOCATION:  2035                         FACILITY:  [facility]   PHYSICIAN:  Vesta Mixer, M.D. DATE OF BIRTH:  [birth year]   DATE OF ADMISSION:  01/01/2006  DATE OF DISCHARGE:  01/04/2006                                 DISCHARGE SUMMARY   DISCHARGE DIAGNOSES:  1.  Coronary artery disease, tight stenosis in the distal circumflex artery.  2.  Hypertension.  3.  Hyperlipidemia.  4.  Hyperthyroidism.  5.  History of back surgery and back spasms.  6.  History of transient ischemic attacks.   DISCHARGE MEDICATIONS:  1.  Metoprolol 100 mg p.o. b.i.d.  2.  Lisinopril 40 mg a day.  3.  Diovan/HCT 160 mg/12.5 mg tablets, 1/2 tablet a day.  4.  Lipitor 10 mg a day.  5.  Propylthiouracil 50 mg twice a day.  6.  Baclofen 10 mg three times a day as needed.  7.  Trazodone 100 mg nightly.  8.  Plavix 75 mg a day.  9.  Imdur 60 mg a day.  10. Aspirin (noncoated) 81 mg a day.  11. Nitroglycerin as needed for chest pain.  12. Prilosec over-the-counter as needed.   DISPOSITION:  The patient will see Dr. Melburn Popper in 2-3 weeks for follow-up  visit.  She is to call for further problems.   HISTORY OF PRESENT ILLNESS:  Ms. Vetter is a 77 year old female with a  history of hypertension, hyperlipidemia and hyperthyroidism.  She was  admitted to the hospital on January 01, 2006, with episodes of chest pain and  positive cardiac enzymes.  Please see dictated H&P for further details.   HOSPITAL COURSE:  The patient had positive cardiac enzymes and ruled in for  a very small myocardial infarction.  She did not have any significant EKG  changes.  On January 03, 2006, she underwent heart catheterization which  revealed fairly normal left anterior descending artery.  Her left circumflex  artery was a large vessel.  The distal aspect of left circumflex artery had  a tight and very tortuous  distal stenosis.  The stenosis was as tight as 80-  90% and extended about 20-30 mm in length.  The vessel opened up for another  20-30 mm as a normal diameter.  The vessel was not a candidate for  angioplasty and certainly was not a candidate for bypass grafting.  The  patient had Imdur added to her medical regimen.  She did quite well.  She  did not have any further episodes of chest pain.  She did have a slightly low blood pressure and we have decreased her Diovan  HCT slightly.  The low blood pressure is probably response of adding the  Imdur.  The patient will be discharged on the above-noted medications and  disposition.  All of her other medical problems remained fairly stable.  We  will see her again in several weeks.           ______________________________  Vesta Mixer, M.D.     PJN/MEDQ  D:  01/04/2006  T:  01/04/2006  Job:  [id number]   cc:   Soyla Murphy. [name], M.D.  Fax: [phone]

## 2010-11-13 NOTE — Op Note (Signed)
NAMEMarland Bass  LACHINA, SALSBERRY                    ACCOUNT NO.:  [id number]   MEDICAL RECORD NO.:  [id number]                   PATIENT TYPE:  INP   LOCATION:  5040                                 FACILITY:  [facility]   PHYSICIAN:  Kerrin Champagne, M.D.                DATE OF BIRTH:  1926-05-21   DATE OF PROCEDURE:  05/29/2003  DATE OF DISCHARGE:  05/31/2003                                 OPERATIVE REPORT   ADDENDUM   ASSISTANT:  Wende Neighbors, P.A.                                               Kerrin Champagne, M.D.    Myra Rude  D:  06/24/2003  T:  06/24/2003  Job:  [id number]

## 2010-11-13 NOTE — Op Note (Signed)
NAMECAMBRIE, SONNENFELD                    ACCOUNT NO.:  [id number]   MEDICAL RECORD NO.:  [id number]                   PATIENT TYPE:  INP   LOCATION:  5040                                 FACILITY:  [facility]   PHYSICIAN:  Kerrin Champagne, M.D.                DATE OF BIRTH:  Sep 06, 1925   DATE OF PROCEDURE:  05/29/2003  DATE OF DISCHARGE:                                 OPERATIVE REPORT   PREOPERATIVE DIAGNOSIS:  Left L4-5 recurrent lateral recess stenosis  effecting the L4-L5 nerve root, left sided L5-S1 lateral recess stenosis  affecting the L5-S1 nerve root.   POSTOPERATIVE DIAGNOSIS:  Moderately severe left-sided L5-S1 lateral recess  stenosis affecting the S1 nerve root with foraminal entrapment involving the  left L5 nerve root.  Very mild lateral recess stenosis, left L4-5.   OPERATION PERFORMED:   SURGEON:  Kerrin Champagne, M.D.   ANESTHESIA:  General.   DESCRIPTION OF PROCEDURE:  After adequate general anesthesia, patient in  knee chest position, Andrews frame, standard preoperative antibiotics,  standard prep with DuraPrep solution, draped in the usual manner, iodine Vi-  drape was used.  An incision ellipsing the old incision scar carried through  the skin and subcutaneous layers down to the lumbodorsal fascia.  Incision  made on the left side of the spinous process of L4-L5.  Clamp placed on the  spinous process of L5.  This area marked with Bovie for continued  identification throughout the remainder of the case.  Two Cobbs used to  elevate the paralumbar muscles on the left side at the left L5-S1 level at  the L4-5 level.  Clamp then placed and intraoperative lateral radiograph  demonstrated the clamp on the spinous process of L5.  Leksell rongeur was  used to remove a small portion on the inferior aspect of the lamina of L5 on  the left side.  It was used to debride the soft tissue overlying the left  posterior interspace at the L4-5 level.   High speed bur was  then used to carefully perform partial medial facetectomy  of the inferior articular process of L5.  Thinning this, a 3 mm Kerrison was  able to be introduced beneath the lamina of L5 on the left side, performing  a left-sided hemilaminectomy, removing the lamina centrally along the left  side.  The medial portion of the inferior articular process of L5 was  partially resected medially using a half-inch osteotome.  Once this was  completed, then a 3 mm Kerrison was used to decompress the lateral recess  carefully performing a partial medial facetectomy of the superior articular  process of S1.  Foraminotomy was performed over the left S1 nerve root.  It  was noted that there was severe indentation of the left S1 nerve root at its  shoulder secondary to hypertrophic ligamentum flavum and the findings of  hypertrophic joint changes along the left L5-S1 joint.  Once this  was  decompressed, there was adherent ligamentum flavum along the left side  continuing to compress on the left side of the thecal sac at L5-S1.  This  was carefully freed off the thecal sac using a nerve hook and Penfield 4,  then resected using a 3 mm Kerrison.  Decompression was then continued  superiorly along the left side to the L3-4 level.  A partial medial  facetectomy is carried out on the left L4-5 medial facet over 15% of the  joint.  Previous scar tissue was resected over the posterior left side of  the thecal sac.  The L5 nerve root decompressed within the lateral recess  and out into the neural foramen on the left side.  The L4 nerve root and its  neural foramen probed.  Probed without difficulty using a hockey stick nerve  probe.  The operating microscope was draped, brought into the field. Under  direct visualization the L5 neural foramen was decompressed removing  reflected portions of ligamentum flavum medially over the superior portion  of the superior articular process of L5.  Probing the neural foramen   following resection of this ligamentum flavum and the anterior aspects of  the facet demonstrated the L5 nerve root to be well-decompressed without  evidence of further compression here.  The L4, L5 and S1 nerve root neural  foramina then were felt to be completely free.  There was no evidence of  disk protrusion.   Irrigation was performed, bone wax applied to the bleeding cancellous bone  surfaces.  Thrombin soaked Gelfoam placed.  This was then removed.  A small  portion of Gelfoam was then placed over the laminotomy region.   Careful inspection demonstrated no evidence of active bleeding.  Soft  tissues then allowed to fall back into place.  Lumbodorsal fascia  reapproximated in the midline with interrupted #1 Vicryl sutures.  Deep  subcutaneous layers approximated with interrupted #1 0 Vicryl sutures, more  superficial layers with interrupted 2-0 Vicryl sutures and the skin closed  with a running subcutaneous stitch of 4-0 Vicryl.  Tincture of benzoin and  Steri-Strips applied.  4 x 4s affixed to the skin with HypaFix tape. The  patient then returned to a supine position, reactivated, extubated and  returned to the recovery room in satisfactory condition.  All instrument and  sponge counts were correct.                                               Kerrin Champagne, M.D.    Myra Rude  D:  05/29/2003  T:  05/30/2003  Job:  [id number]

## 2010-11-13 NOTE — H&P (Signed)
NAMEMarland Kitchen  Kimberly Bass, Kimberly Bass NO.:  [id number]   MEDICAL RECORD NO.:  [id number]          PATIENT TYPE:  EMS   LOCATION:  MAJO                         FACILITY:  [facility]   PHYSICIAN:  Vesta Mixer, M.D. DATE OF BIRTH:  [birth year]   DATE OF ADMISSION:  01/01/2006  DATE OF DISCHARGE:                                HISTORY & PHYSICAL   HISTORY:  Kimberly Bass is a 77 year old female with a history of  hypertension, hyperlipidemia, and hyperthyroidism.  She is admitted today  with a new onset of chest pain and positive cardiac enzymes.   The patient has a long history of hyperlipidemia and hypertension.  She had  not had any chest pain until this morning.  This morning, she had an episode  of chest pain and pressure, which radiated out to her arm, neck, and  shoulder.  She came to the emergency room, and the pain had resolved by that  time.  She was noted to have a mildly elevated but gradually increasing  troponin levels and is now admitted.  The patient denies any previous  episodes of chest pains.   CURRENT MEDICATIONS:  1.  Atenolol 100 mg p.o. b.i.d.  2.  Lisinopril 40 mg a day.  3.  Diovan/HCT 160 mg/12.5 mg a day.  4.  Lipitor 10 mg a day.  5.  Propylthiouracil 50 mg twice a day.  6.  Baclofen 10 mg three times a day.  7.  Trazodone 100 mg q.h.s.  8.  Plavix 75 mg a day.  9.  Prilosec over-the-counter once a day.  10. Tylenol Arthritis once a day.  11. Calcium with Vitamin D once a day.  12. Multivitamin once a day.   She is ALLERGIC TO PENICILLIN.   PAST MEDICAL HISTORY:  1.  History of hypertension.  2.  Hyperlipidemia.  3.  Hyperthyroidism.  4.  History of TIAs.  5.  History of back/neck surgery.   SOCIAL HISTORY:  The patient is a nonsmoker.   FAMILY HISTORY:  Noncontributory.   REVIEW OF SYSTEMS:  She denies any previous episodes of chest pain.  She has  had lots of problems with her neck pain, and neurologic problems.  She has  an  interesting phenomena that she had developed a very severe tremor  whenever she is sick such as having a stroke, or in the case of this  morning, she had chest pain then developed a severe leg tremor.   PHYSICAL EXAMINATION:  GENERAL:  She is an elderly female.  She was shaking  violently when I entered the room.  This apparently was because of her  headache.  The shaking resolved after we stopped the nitroglycerin and gave  her 5 of IV Metoprolol.  VITAL SIGNS:  Blood pressure is 148/80 with a heart rate of 95.  HEENT:  2+ carotids.  She has no bruits.  There is no JVD.  LUNGS:  Clear.  HEART:  Regular rate, S1, S2, with no murmur.  ABDOMEN:  Good bowel sounds and is nontender.  EXTREMITIES:  She has good pulses.  She has no clubbing, cyanosis, or  edema.  NEURO EXAM:  She had a significant tremor when I entered the room.  She had  calmed down after we did the above-noted measures.   Her troponin levels initially were 0.08 and later was 0.12.  Her white blood  cell count is 6.9 with a hemoglobin of 15.8, her hematocrit is 47.4.  Sodium  is 140, potassium is 3.7, chloride is 103, CO2 is 30, BUN is 15, creatinine  is 0.6.  Her glucose is 103.   IMPRESSION AND PLAN:  1.  Chest pain.  The patient presents with chest pain associated with      positive troponin levels.  This is very suspicious for an acute coronary      syndrome.  She is on Plavix for her history of transient ischemic      attacks.  We will keep her on intravenous heparin for now.  She did not      tolerate the nitroglycerin and developed a headache and subsequent      significant leg tremor.  We will anticipate doing a heart      catheterization on Monday.  We discussed the risks, benefits, and      options of heart catheterization.  She understands and agrees to      proceed.  2.  Hyperthyroidism.  She is currently on propylthiouracil (PTU) and high-      dose Atenolol.  I would like to change her to Metoprolol, which  may be a      little bit better for her heart.  Will check a TSH level.  3.  Hypertension - fairly stable.           ______________________________  Vesta Mixer, M.D.     PJN/MEDQ  D:  01/01/2006  T:  01/01/2006  Job:  [id number]   cc:   Soyla Murphy. [name], M.D.  Fax: [phone]

## 2010-11-13 NOTE — Op Note (Signed)
Kimberly Bass, MEINE NO.:  [id number]   MEDICAL RECORD NO.:  [id number]          PATIENT TYPE:  INP   LOCATION:  2899                         FACILITY:  [facility]   PHYSICIAN:  Sharolyn Douglas, M.D.        DATE OF BIRTH:  [birth year]   DATE OF PROCEDURE:  04/22/2004  DATE OF DISCHARGE:                                 OPERATIVE REPORT   PREOPERATIVE DIAGNOSIS:  Cervical spondylotic myelopathy.   POSTOPERATIVE DIAGNOSIS:  Cervical spondylotic myelopathy.   OPERATION PERFORMED:  1.  Anterior cervical diskectomy, C5-6 and C6-7 with decompression of the      spinal cord and nerve roots bilaterally.  2.  Anterior cervical arthrodesis, C5-6 and C6-7, placement of two      allograft, prosthesis spacers packed with local autogenous bone graft.  3.  Anterior cervical plating, C5 through C7 using [employer] system.  4.  Neuro monitoring with somatosensory evoked potentials.   SURGEON:  Sharolyn Douglas, M.D.   ASSISTANT:  Verlin Fester, P.A.   ANESTHESIA:  General endotracheal.   ESTIMATED BLOOD LOSS:   COMPLICATIONS:  None.   INDICATIONS FOR PROCEDURE:  The patient is a very pleasant 77 year old  female with progressive gait disturbance secondary to cervical spondylotic  myelopathy.  She has associated neck and bilateral upper extremity pain,  left greater than right.  She also has severe lumbar spinal stenosis.  She  has elected to undergo anterior cervical diskectomy and fusion, C5 through  C7 with allograft and plate in hopes of improving her symptoms preventing  further neurologic deterioration.  She knows the risks and benefits.   DESCRIPTION OF PROCEDURE:  The patient was properly identified in the  holding area and taken to the operating room.  She underwent general  endotracheal anesthesia without difficulty, given prophylactic intravenous  antibiotics.  She was carefully intubated with her neck protected and kept  in a neutral position.  She was positioned using  the [place]field head rest.  Care was taken not to extend the neck.  Neural monitoring was established in  the form of somatosensory evoked potentials.  Motor evoked potentials had to  be avoided because of a history of seizures.  The neck was prepped and  draped in the usual sterile fashion.  5 pounds of halter traction applied.  A small transverse incision was made at the level of the thyroid cartilage  along the left side of the neck.  Dissection was carried sharply through the  platysma.  The interval between sternocleidomastoid and strap muscles was  developed down to the prevertebral space.  The esophagus, trachea and  carotic sheath were identified and protected at all times.  Spinal needle  placed at C5-6.  An intraoperative x-ray confirmed our location.  The longus  colli muscle was elevated out over the C5-6 and C6-7 disk spaces  bilaterally.  There were large anterior osteophytes.  Deep Shadowline  retractor placed.  Osteophytes were removed with Leksell rongeur.  Caspar  distraction pins were placed in C5, C6 and C7 vertebral bodies.  Gentle  distraction applied.  The disk spaces were completely collapsed. There  was  very little disk material.  High speed bur used to take down the  cartilaginous end plates as well as the uncovertebral joints.  We then used  the bur to remove the posterior osteophytes.  Micro Kerrisons and curettes  used to remove the posterior longitudinal ligament and undercut the  vertebral bodies.  Starting at C5-6 we had a good decompression of the  spinal canal as well as the neural foramen.  At C6-7 there was a large spur  projecting into the thecal sac on the left side.  Great care was taken using  the operating microscope to avoid any pressure on the underlying spinal  cord.  There were no deleterious changes in the SSEPs.  We again felt we had  a good decompression at C6-7.  We then irrigated and controlled bleeding  with FloSeal.  We placed 6 mm allograft  prosthesis spacers packed with local  bone graft obtained from the drill shavings.  The prosthesis spacers were  carefully countersunk 1 mm.  The Caspar distraction pins were removed.  We  then placed a 38 mm anterior cervical plate with six 12 mm screws.  We  ensured the locking mechanism engaged.  The patient's bone was extremely  soft and the fixation was marginal.  The wound was irrigated.  The  esophagus, trachea and carotid sheath were examined.  There were no apparent  injuries.  We placed a Penrose drain, closed the platysma with an  interrupted 2-0 Vicryl.  Subcutaneous layer closed with interrupted 3-0  Vicryl followed by a running 4-0 subcuticular Vicryl suture on the skin  edges.  Benzoin and Steri-Strips were placed.  The patient was placed into a  cervical collar and will be maintained in a rigid collar until her fusion  consolidates because of soft bone quality.  She was extubated without  difficulty and transferred to the recovery room in stable condition.       MC/MEDQ  D:  04/22/2004  T:  04/22/2004  Job:  [id number]

## 2010-11-13 NOTE — H&P (Signed)
NAMELECIA, Kimberly Bass NO.:  [id number]   MEDICAL RECORD NO.:  [id number]          PATIENT TYPE:  INP   LOCATION:  NA                           FACILITY:  [facility]   PHYSICIAN:  Sharolyn Douglas, M.D.        DATE OF BIRTH:  1925-12-05   DATE OF ADMISSION:  04/22/2004  DATE OF DISCHARGE:                                HISTORY & PHYSICAL   CHIEF COMPLAINT:  Pain in my neck and difficulty walking.   HISTORY OF PRESENT ILLNESS:  This 77 year old white female was seen by Dr.  Otelia Sergeant in the past for severe spinal stenosis.  She has myelopathy which is  quite progressive.  She has more difficulty getting about and has a tendency  to fall to the left.  Dr. Otelia Sergeant and her family physician, Dr. [name], are all  aware of this planned surgery and are in agreement to go ahead.  She has had  a workup in the past by Dr. Anne Hahn because of seizure and falling.  The CT  scan of the brain was unremarkable.  It was felt that the brain is not the  cause of her myelopathy in the lower extremities.  She has also had nerve  conduction studies at [facility] showing that she had a mild degree of  cervical myelopathy.  The studies at [facility] were negative for  radiculopathy or peripheral neuropathy.  Her MRI of the cervical spine has  shown multilevel severe degenerative disc disease extending from C3 through  C7.  Soft disc herniation is seen at each level.  The most prominent seen is  C6 and C7.  She has compression of the cord slightly at C5-C6 and C6-C7.  She also has multilevel biforaminal stenosis secondary to spurring.  Due to  these positive findings and the fact that the patient now has to use a  walker and has developed positive Hoffman signs on the right with hyper-  reflex sensitivity, it is felt that she would benefit with surgical  intervention at the most severe levels of C5-C6 and C6-C7.   CURRENT MEDICATIONS:  Atenolol 100 mg b.i.d., Lisinopril 40 mg daily,  calcium 500 mg b.i.d.,  Prilosec one daily, multi-vitamin daily, Trazodone 50  mg at bedtime, Clinoril 200 mg daily (will stop prior to surgery).  She also  takes Clonazepam 0.5 mg b.i.d. and propylthiouracil 50 mg, 2 b.i.d.   ALLERGIES:  She is allergic to penicillin, codeine causes nausea as well as  morphine and Demerol.  She is also allergic to Klor-Con, Effexor, and  Amitriptyline.   PAST MEDICAL HISTORY:  Her family physician is Dr. Merri Brunette who is  treating her for hypertension, hiatal hernia.  She has a history of ulcer  disease.  She also is hypothyroid and takes supplements.   PAST SURGICAL HISTORY:  Hysterectomy secondary to fibroid tumor in 1972,  oophorectomy on the left in 1999.  She has had two lumbar laminectomies, one  in 2000 and one in 2004.  She had nodules removed from her right breast  (benign) in 1997 and back in 1947 and 1948, she had  tonsillectomy followed  by appendectomy the next  year.   FAMILY HISTORY:  Positive for heart disease in the father and mother,  diabetes in the sister, and a stroke in the sister.   SOCIAL HISTORY:  The patient is married, she is retired, and has no intake  of alcohol or tobacco products, she has one child.  Care after surgery will  be her husband and granddaughter.  She lives in a one level home.   REVIEW OF SYMPTOMS:  CNS:  No seizures, paralysis, numbness, or double  vision but the patient does have myelopathy in the lower extremity which  affects her gait.  CARDIOVASCULAR:  No chest pain, no angina, no orthopnea.  RESPIRATORY:  No productive cough, no hemoptysis, no shortness of breath.  GASTROINTESTINAL:  No nausea, vomiting, melena, bloody stool.  GENITOURINARY:  No discharge or hematuria.  MUSCULOSKELETAL:  Primarily in  present illness.   PHYSICAL EXAMINATION:  GENERAL:  Alert, cooperative, friendly 77 year old black female who is  accompanied by her granddaughter and great granddaughter.  She is somewhat  anxious.  Fully alert and  oriented.  She does have a resting tremor.  VITAL SIGNS:  Blood pressure 160/86, pulse 76, respirations are 12.  HEENT:  Normocephalic, PERRLA, oropharynx clear.  CHEST:  Clear to auscultation, no rhonchi, no rales.  HEART;  Regular rate and rhythm, no murmurs are hard.  ABDOMEN:  Soft, nontender, liver and spleen not felt.  GENITALIA/RECTAL/BREASTS:  Not done.  EXTREMITIES:  The patient has no gross weakness in the upper extremities.   ADMISSION DIAGNOSIS:  1.  Cervical spondylitic neuropathy.  2.  Lumbar stenosis.  3.  History of seizure disorder.  4.  Hypothyroidism.  5.  Hypertension.   PLAN:  The patient will undergo anterior cervical discectomy and fusion at  C5-C6 and C6-C7 with allograft and plate.  Plan to have post hospitalization  Home Health after surgery to assist in the patient's care, dressing changes,  etc.  Today, she is fitted with a soft cervical collar which will be used  postoperatively.       DLU/MEDQ  D:  04/15/2004  T:  04/15/2004  Job:  [id number]   cc:   Soyla Murphy. [name], M.D.  [address]  [place]  [place] [address]  Fax: [phone]

## 2010-11-19 ENCOUNTER — Other Ambulatory Visit: Payer: Self-pay | Admitting: *Deleted

## 2010-11-19 MED ORDER — CLOPIDOGREL BISULFATE 75 MG PO TABS: 75.0000 mg | ORAL_TABLET | Freq: Every day | ORAL | Status: DC

## 2010-11-19 NOTE — Telephone Encounter (Signed)
Fax received from pharmacy. Refill completed. Jodette Avanna Sowder RN  

## 2010-11-27 ENCOUNTER — Ambulatory Visit
Admission: RE | Admit: 2010-11-27 | Discharge: 2010-11-27 | Disposition: A | Discharge status: Home / Self Care | Payer: Medicare Other | Source: Ambulatory Visit | Attending: Internal Medicine | Admitting: Internal Medicine

## 2010-11-27 DIAGNOSIS — Z1231 Encounter for screening mammogram for malignant neoplasm of breast: Secondary | ICD-10-CM

## 2010-12-16 ENCOUNTER — Encounter: Payer: Self-pay | Admitting: *Deleted

## 2010-12-16 DIAGNOSIS — Z8673 Personal history of transient ischemic attack (TIA), and cerebral infarction without residual deficits: Secondary | ICD-10-CM | POA: Insufficient documentation

## 2010-12-21 ENCOUNTER — Other Ambulatory Visit (INDEPENDENT_AMBULATORY_CARE_PROVIDER_SITE_OTHER): Payer: Medicare Other | Admitting: *Deleted

## 2010-12-21 ENCOUNTER — Ambulatory Visit: Payer: Medicare Other | Admitting: Cardiovascular Disease

## 2010-12-21 DIAGNOSIS — E785 Hyperlipidemia, unspecified: Secondary | ICD-10-CM

## 2010-12-23 ENCOUNTER — Encounter: Payer: Self-pay | Admitting: Cardiovascular Disease

## 2010-12-23 ENCOUNTER — Ambulatory Visit (INDEPENDENT_AMBULATORY_CARE_PROVIDER_SITE_OTHER): Payer: Medicare Other | Admitting: Cardiovascular Disease

## 2010-12-23 DIAGNOSIS — I2510 Atherosclerotic heart disease of native coronary artery without angina pectoris: Secondary | ICD-10-CM

## 2010-12-23 DIAGNOSIS — R002 Palpitations: Secondary | ICD-10-CM | POA: Insufficient documentation

## 2010-12-23 MED ORDER — METOPROLOL TARTRATE 100 MG PO TABS: 100.0000 mg | ORAL_TABLET | Freq: Two times a day (BID) | ORAL | Status: DC

## 2010-12-23 NOTE — Assessment & Plan Note (Signed)
Her palpitations have largely resolved after she received treatment for her hyperthyroidism.

## 2010-12-23 NOTE — Progress Notes (Signed)
Kimberly Bass Date of Birth  [birth year] [facility] [address]     [address] [place], [place]  [address] [phone]  Fax  [phone]  History of Present Illness:  77 yo with history of CAD ( distal LCx lesion), hyperlipidemia, HTN.  Pt feels much better. Had RAI for her hyperthyroidism and has done great since that time.  No further palpitations.    Current Outpatient Prescriptions on File Prior to Visit  Medication Sig Dispense Refill  . acetaminophen (TYLENOL ARTHRITIS PAIN) 650 MG CR tablet Take 650 mg by mouth every 8 (eight) hours as needed. Taking 2 tid       . amLODipine (NORVASC) 5 MG tablet Take 5 mg by mouth daily.        Marland Kitchen aspirin 81 MG tablet Take 81 mg by mouth daily.        . baclofen (LIORESAL) 20 MG tablet Take 20 mg by mouth 3 (three) times daily. Taking  One and one-half in the am ,one at noon and one and one half pm       . Calcium Carbonate-Vitamin D (CALCIUM + D PO) Take by mouth 2 (two) times daily.        . clopidogrel (PLAVIX) 75 MG tablet Take 1 tablet (75 mg total) by mouth daily.  90 tablet  3  . gabapentin (NEURONTIN) 300 MG capsule Take 300 mg by mouth 4 (four) times daily. Taking 1 am 2 at noon and 1 hs      . isosorbide mononitrate (IMDUR) 60 MG 24 hr tablet Take 1 tablet (60 mg total) by mouth every morning.  90 tablet  3  . metoprolol (LOPRESSOR) 100 MG tablet Take 1 tablet (100 mg total) by mouth 2 (two) times daily.  180 tablet  3  . Multiple Vitamin (MULTIVITAMIN) tablet Take 1 tablet by mouth daily.        Marland Kitchen NITROSTAT 0.4 MG SL tablet DISSOLVE ONE TABLET UNDER THE TONGUE EVERY 5 MINUTES AS NEEDED FOR CHEST PAIN.  DO NOT EXCEED A TOTAL OF 3 DOSES IN 15 MINUTES  25 each  3  . Potassium 75 MG TABS Take 90 mg by mouth daily.        . ranitidine (ZANTAC) 150 MG tablet Take 150 mg by mouth 2 (two) times daily.        . simvastatin (ZOCOR) 20 MG tablet Take 20 mg by mouth at bedtime.        .  triamterene-hydrochlorothiazide (MAXZIDE-25) 37.5-25 MG per tablet Take 1 tablet by mouth daily.        Marland Kitchen zolpidem (AMBIEN) 10 MG tablet Take 10 mg by mouth at bedtime as needed.        Marland Kitchen DISCONTD: propylthiouracil (PTU) 50 MG tablet Take by mouth 3 (three) times daily. Taking 3 am and 4 pm         Allergies  Allergen Reactions  . Codeine   . Meperidine Hcl   . Morphine   . Penicillins     Past Medical History  Diagnosis Date  . Coronary artery disease   . Hyperlipidemia   . Thyroid disease   . Hypertension   . Hx of transient ischemic attack (TIA)     Past Surgical History  Procedure Date  . Cardiac catheterization 01/03/2006  . Hx back surgery     History  Smoking status  . Never Smoker   Smokeless tobacco  . Not on file  History  Alcohol Use     No family history on file.  Reviw of Systems:  Reviewed in the HPI.  All other systems are negative.  Physical Exam: BP 118/72  Pulse 64  Ht 5' (1.524 m)  Wt 156 lb (70.761 kg)  BMI 30.47 kg/m2 The patient is alert and oriented x 3.  The mood and affect are normal.   Skin: warm and dry.  Color is normal.    HEENT:   the sclera are nonicteric.  The mucous membranes are moist.  The carotids are 2+ without bruits.  There is no thyromegaly.  There is no JVD.    Lungs: clear.  The chest wall is non tender.    Heart: regular rate with a normal S1 and S2.  There are no murmurs, gallops, or rubs. The PMI is not displaced.     Abdomin: good bowel sounds.  There is no guarding or rebound.  There is no hepatosplenomegaly or tenderness.  There are no masses.   Extremities:  no clubbing, cyanosis, or edema.  The legs are without rashes.  The distal pulses are intact.   Neuro:  Cranial nerves II - XII are intact.  Motor and sensory functions are intact.    The gait is normal.  Recent labwork from her medical doctor reveals optimal cholesterol control.  Assessment / Plan:

## 2010-12-23 NOTE — Assessment & Plan Note (Signed)
Kimberly Bass is doing very well. She has not had any episodes of angina. We'll continue with her same medications.

## 2011-07-05 ENCOUNTER — Encounter: Payer: Self-pay | Admitting: Cardiovascular Disease

## 2011-07-05 ENCOUNTER — Ambulatory Visit (INDEPENDENT_AMBULATORY_CARE_PROVIDER_SITE_OTHER): Payer: Medicare Other | Admitting: Cardiovascular Disease

## 2011-07-05 VITALS — BP 129/79 | HR 67 | Ht 59.0 in | Wt 161.0 lb

## 2011-07-05 DIAGNOSIS — I2510 Atherosclerotic heart disease of native coronary artery without angina pectoris: Secondary | ICD-10-CM

## 2011-07-05 NOTE — Assessment & Plan Note (Signed)
Kimberly Bass is doing very well. She has occasional episodes of angina but these are resolved fairly quickly after she takes nitroglycerin. Her exercise is somewhat limited. She now with walks with the assistance of a walker. She will continue to take nitroglycerin on an as-needed basis.

## 2011-07-05 NOTE — Progress Notes (Signed)
Kimberly Bass Date of Birth  [birth year] [facility]     [place] [facility]  [address]    [address]   [address] [place], [place]  [address]    [place], [place]  [address] [phone]  Fax  [phone]  [phone]  Fax [phone]   History of Present Illness:  Kimberly Bass is an 78 yo with a hx of CAD.  She had a heart catheterization on January 03, 2006. She has a very tight stenosis in the terminal aspect of a large posterior lateral branch. This stenosis is very tortuous and is diffusely diseased and is not a candidate for PCI.  She's had some intermittent episodes of angina over the holiday. She's been under lots of stress with the death of her sister.  The pain resolved after 2 NTG over a 10 minute period.  Current Outpatient Prescriptions on File Prior to Visit  Medication Sig Dispense Refill  . acetaminophen (TYLENOL ARTHRITIS PAIN) 650 MG CR tablet Take 650 mg by mouth every 8 (eight) hours as needed. Taking 2 tid       . amLODipine (NORVASC) 5 MG tablet Take 5 mg by mouth daily.        Marland Kitchen aspirin 81 MG tablet Take 81 mg by mouth daily.        . baclofen (LIORESAL) 20 MG tablet Take 20 mg by mouth 3 (three) times daily. Taking  One and one-half in the am ,one at noon and one and one half pm       . Calcium Carbonate-Vitamin D (CALCIUM + D PO) Take by mouth 2 (two) times daily.        . clopidogrel (PLAVIX) 75 MG tablet Take 1 tablet (75 mg total) by mouth daily.  90 tablet  3  . gabapentin (NEURONTIN) 300 MG capsule Take 300 mg by mouth 4 (four) times daily. Taking 1 am 2 at noon and 1 hs      . isosorbide mononitrate (IMDUR) 60 MG 24 hr tablet Take 1 tablet (60 mg total) by mouth every morning.  90 tablet  3  . metoprolol tartrate (LOPRESSOR) 100 MG tablet Take 1 tablet (100 mg total) by mouth 2 (two) times daily.  180 tablet  3  . Multiple Vitamin (MULTIVITAMIN) tablet Take 1 tablet by mouth daily.        Marland Kitchen NITROSTAT 0.4 MG SL tablet DISSOLVE ONE  TABLET UNDER THE TONGUE EVERY 5 MINUTES AS NEEDED FOR CHEST PAIN.  DO NOT EXCEED A TOTAL OF 3 DOSES IN 15 MINUTES  25 each  3  . Potassium 75 MG TABS Take 90 mg by mouth daily.        . ranitidine (ZANTAC) 150 MG tablet Take 150 mg by mouth 2 (two) times daily.        . simvastatin (ZOCOR) 20 MG tablet Take 20 mg by mouth at bedtime.        . triamterene-hydrochlorothiazide (MAXZIDE-25) 37.5-25 MG per tablet Take 1 tablet by mouth daily.        Marland Kitchen zolpidem (AMBIEN) 10 MG tablet Take 10 mg by mouth at bedtime as needed.          Allergies  Allergen Reactions  . Codeine   . Meperidine Hcl   . Morphine   . Penicillins     Past Medical History  Diagnosis Date  . Coronary artery disease   . Hyperlipidemia   . Thyroid disease   . Hypertension   .  Hx of transient ischemic attack (TIA)     Past Surgical History  Procedure Date  . Cardiac catheterization 01/03/2006  . Hx back surgery     History  Smoking status  . Never Smoker   Smokeless tobacco  . Not on file    History  Alcohol Use     No family history on file.  Reviw of Systems:  Reviewed in the HPI.  All other systems are negative.  Physical Exam: BP 129/79  Pulse 67  Ht 4\' 11"  (1.499 m)  Wt 161 lb (73.029 kg)  BMI 32.52 kg/m2 The patient is alert and oriented x 3.  The mood and affect are normal.   Skin: warm and dry.  Color is normal.    HEENT:   Normocephalic/atraumatic. Normal carotids. No JVD.  Lungs: Lungs are clear.   Heart: Regular rate S1-S2. No murmurs gallops or rubs the    Abdomen:  good bowel sounds  Extremities:  No c/c/e  Neuro:  Non focal.  She has an age related tremor  .  She walks with a walker.  ECG:  Assessment / Plan:

## 2011-07-05 NOTE — Patient Instructions (Signed)
Your physician wants you to follow-up in: 6 months You will receive a reminder letter in the mail two months in advance. If you don't receive a letter, please call our office to schedule the follow-up appointment.  Your physician recommends that you return for a FASTING lipid profile: 6 months   Your physician recommends that you continue on your current medications as directed. Please refer to the Current Medication list given to you today.  

## 2011-10-12 ENCOUNTER — Other Ambulatory Visit: Payer: Self-pay | Admitting: *Deleted

## 2011-10-12 DIAGNOSIS — I2510 Atherosclerotic heart disease of native coronary artery without angina pectoris: Secondary | ICD-10-CM

## 2011-10-12 MED ORDER — METOPROLOL TARTRATE 100 MG PO TABS: 100.0000 mg | ORAL_TABLET | Freq: Two times a day (BID) | ORAL | Status: DC

## 2011-10-12 MED ORDER — ISOSORBIDE MONONITRATE ER 60 MG PO TB24: 60.0000 mg | ORAL_TABLET | ORAL | Status: DC

## 2011-10-12 NOTE — Telephone Encounter (Signed)
Fax Received. Refill Completed. Lizet Kelso Chowoe (R.M.A)   

## 2011-11-03 ENCOUNTER — Other Ambulatory Visit: Payer: Self-pay | Admitting: Internal Medicine

## 2011-11-03 DIAGNOSIS — Z1231 Encounter for screening mammogram for malignant neoplasm of breast: Secondary | ICD-10-CM

## 2011-11-09 ENCOUNTER — Other Ambulatory Visit: Payer: Self-pay | Admitting: *Deleted

## 2011-11-09 ENCOUNTER — Telehealth: Payer: Self-pay | Admitting: Cardiovascular Disease

## 2011-11-09 MED ORDER — CLOPIDOGREL BISULFATE 75 MG PO TABS: 75.0000 mg | ORAL_TABLET | Freq: Every day | ORAL | Status: AC

## 2011-11-09 NOTE — Telephone Encounter (Signed)
Walk-In Pt Form .Marland Kitchen...  Pt needs Refill On RX Put form in Dr Jeanice Lim .Marland Kitchen Nahser

## 2011-11-09 NOTE — Telephone Encounter (Signed)
Patient request refill. completed

## 2011-11-29 ENCOUNTER — Ambulatory Visit
Admission: RE | Admit: 2011-11-29 | Discharge: 2011-11-29 | Disposition: A | Discharge status: Home / Self Care | Payer: Medicare Other | Source: Ambulatory Visit | Attending: Internal Medicine | Admitting: Internal Medicine

## 2011-11-29 DIAGNOSIS — Z1231 Encounter for screening mammogram for malignant neoplasm of breast: Secondary | ICD-10-CM

## 2012-01-05 ENCOUNTER — Ambulatory Visit (INDEPENDENT_AMBULATORY_CARE_PROVIDER_SITE_OTHER): Payer: Medicare Other | Admitting: Cardiovascular Disease

## 2012-01-05 ENCOUNTER — Encounter: Payer: Self-pay | Admitting: Cardiovascular Disease

## 2012-01-05 VITALS — BP 123/79 | HR 64 | Ht 59.0 in | Wt 163.0 lb

## 2012-01-05 DIAGNOSIS — I2510 Atherosclerotic heart disease of native coronary artery without angina pectoris: Secondary | ICD-10-CM

## 2012-01-05 NOTE — Assessment & Plan Note (Signed)
I'm very pleased with how she is doing. We'll see her again in 6 months. We'll check fasting lipids, and basic metabolic profile, and hepatic profile at that time. We'll also check another EKG.

## 2012-01-05 NOTE — Patient Instructions (Addendum)
Your physician wants you to follow-up in: 6 MONTHS You will receive a reminder letter in the mail two months in advance. If you don't receive a letter, please call our office to schedule the follow-up appointment.  Your physician recommends that you return for a FASTING lipid profile: 6 MONTHS    

## 2012-01-05 NOTE — Progress Notes (Signed)
Kimberly Bass Date of Birth  1925/12/21       [facility]    [place] [address]  [address], [address] [place], [place]  [address]   [place], [place]  [address] [phone]     [phone]   Fax  [phone]    Fax [phone]  Problem List: 1. Coronary artery disease-is a tight stenosis in the distal aspect of her circumflex artery 2. Hyperthyroidism 3. Hyperlipidemia 4. Hypertension  History of Present Illness: [name] is an 78 yo with a hx of CAD. She had a heart catheterization on January 03, 2006. She has a very tight stenosis in the terminal aspect of a large posterior lateral branch. This stenosis is very tortuous and is diffusely diseased and is not a candidate for PCI.  She's not had any episodes of chest pain or shortness breath. She's been able to do all of her normal activities without any limitations. She's to have a good bit of angina but has not noticed any in the past several months.  Current Outpatient Prescriptions on File Prior to Visit  Medication Sig Dispense Refill  . acetaminophen (TYLENOL ARTHRITIS PAIN) 650 MG CR tablet Take 650 mg by mouth every 8 (eight) hours as needed. Taking 2 tid       . amLODipine (NORVASC) 5 MG tablet Take 5 mg by mouth daily.        Marland Kitchen aspirin 81 MG tablet Take 81 mg by mouth daily.        . baclofen (LIORESAL) 20 MG tablet Take 20 mg by mouth 3 (three) times daily. Taking  One and one-half in the am ,one at noon and one and one half pm       . Calcium Carbonate-Vitamin D (CALCIUM + D PO) Take by mouth 2 (two) times daily.        . clopidogrel (PLAVIX) 75 MG tablet Take 1 tablet (75 mg total) by mouth daily.  90 tablet  3  . gabapentin (NEURONTIN) 300 MG capsule Take 300 mg by mouth 4 (four) times daily. Taking 1 am 2 at noon and 1 hs      . isosorbide mononitrate (IMDUR) 60 MG 24 hr tablet Take 1 tablet (60 mg total) by mouth every morning.  90 tablet  3  . levothyroxine (SYNTHROID,  LEVOTHROID) 75 MCG tablet Take 75 mcg by mouth daily.        . metoprolol (LOPRESSOR) 100 MG tablet Take 1 tablet (100 mg total) by mouth 2 (two) times daily.  180 tablet  3  . Multiple Vitamin (MULTIVITAMIN) tablet Take 1 tablet by mouth daily.        Marland Kitchen NITROSTAT 0.4 MG SL tablet DISSOLVE ONE TABLET UNDER THE TONGUE EVERY 5 MINUTES AS NEEDED FOR CHEST PAIN.  DO NOT EXCEED A TOTAL OF 3 DOSES IN 15 MINUTES  25 each  3  . Potassium 75 MG TABS Take 90 mg by mouth daily.        . ranitidine (ZANTAC) 150 MG tablet Take 150 mg by mouth 2 (two) times daily.        . simvastatin (ZOCOR) 20 MG tablet Take 20 mg by mouth at bedtime.        . triamterene-hydrochlorothiazide (MAXZIDE-25) 37.5-25 MG per tablet Take 1 tablet by mouth daily.        Marland Kitchen zolpidem (AMBIEN) 10 MG tablet Take 10 mg by mouth at bedtime as needed.  Allergies  Allergen Reactions  . Codeine   . Meperidine Hcl   . Morphine   . Penicillins     Past Medical History  Diagnosis Date  . Coronary artery disease   . Hyperlipidemia   . Thyroid disease   . Hypertension   . Hx of transient ischemic attack (TIA)     Past Surgical History  Procedure Date  . Cardiac catheterization 01/03/2006  . Hx back surgery     History  Smoking status  . Never Smoker   Smokeless tobacco  . Not on file    History  Alcohol Use     No family history on file.  Reviw of Systems:  Reviewed in the HPI.  All other systems are negative.  Physical Exam: Blood pressure 123/79, pulse 64, height 4\' 11"  (1.499 m), weight 163 lb (73.936 kg). General: Well developed, well nourished, in no acute distress.  She has a benign tremor.  Head: Normocephalic, atraumatic, sclera non-icteric, mucus membranes are moist,   Neck: Supple. Carotids are 2 + without bruits. No JVD  Lungs: Clear bilaterally to auscultation.  Heart: regular rate.  normal  S1 S2. No murmurs, gallops or rubs.  Abdomen: Soft, non-tender, non-distended with normal bowel  sounds. No hepatomegaly. No rebound/guarding. No masses.  Msk:  Strength and tone are normal  Extremities: No clubbing or cyanosis. No edema.  Distal pedal pulses are 2+ and equal bilaterally.  Neuro: Alert and oriented X 3. Moves all extremities spontaneously.  Psych:  Responds to questions appropriately with a normal affect.  ECG: 01/05/2012 normal sinus rhythm at 63 beats a minute. She has no ST or T wave changes. There is poor R wave progression but this is unchanged from previous tracings.  Assessment / Plan:

## 2012-01-07 NOTE — Addendum Note (Signed)
Addended by: Lacie Scotts on: 01/07/2012 04:34 PM   Modules accepted: Orders

## 2012-01-28 ENCOUNTER — Other Ambulatory Visit (HOSPITAL_COMMUNITY): Payer: Self-pay | Admitting: Internal Medicine

## 2012-01-28 DIAGNOSIS — E049 Nontoxic goiter, unspecified: Secondary | ICD-10-CM

## 2012-02-01 ENCOUNTER — Ambulatory Visit (HOSPITAL_COMMUNITY)
Admission: RE | Admit: 2012-02-01 | Discharge: 2012-02-01 | Disposition: A | Discharge status: Home / Self Care | Payer: Medicare Other | Source: Ambulatory Visit | Attending: Internal Medicine | Admitting: Internal Medicine

## 2012-02-01 DIAGNOSIS — E049 Nontoxic goiter, unspecified: Secondary | ICD-10-CM | POA: Insufficient documentation

## 2012-08-15 ENCOUNTER — Encounter: Payer: Self-pay | Admitting: Neurology

## 2012-08-15 DIAGNOSIS — M79605 Pain in left leg: Secondary | ICD-10-CM | POA: Insufficient documentation

## 2012-08-15 DIAGNOSIS — M5137 Other intervertebral disc degeneration, lumbosacral region: Secondary | ICD-10-CM

## 2012-08-15 DIAGNOSIS — Z8739 Personal history of other diseases of the musculoskeletal system and connective tissue: Secondary | ICD-10-CM | POA: Insufficient documentation

## 2012-08-15 DIAGNOSIS — R269 Unspecified abnormalities of gait and mobility: Secondary | ICD-10-CM | POA: Insufficient documentation

## 2012-08-15 DIAGNOSIS — G250 Essential tremor: Secondary | ICD-10-CM

## 2012-09-28 ENCOUNTER — Ambulatory Visit (INDEPENDENT_AMBULATORY_CARE_PROVIDER_SITE_OTHER): Payer: Medicare Other | Admitting: Neurology

## 2012-09-28 ENCOUNTER — Encounter: Payer: Self-pay | Admitting: Neurology

## 2012-09-28 VITALS — BP 116/68 | HR 65 | Ht 59.0 in | Wt 168.0 lb

## 2012-09-28 DIAGNOSIS — R269 Unspecified abnormalities of gait and mobility: Secondary | ICD-10-CM

## 2012-09-28 DIAGNOSIS — G250 Essential tremor: Secondary | ICD-10-CM

## 2012-09-28 DIAGNOSIS — G252 Other specified forms of tremor: Secondary | ICD-10-CM

## 2012-09-28 MED ORDER — GABAPENTIN 300 MG PO CAPS: 300.0000 mg | ORAL_CAPSULE | Freq: Three times a day (TID) | ORAL | Status: DC

## 2012-09-28 MED ORDER — BACLOFEN 20 MG PO TABS: 20.0000 mg | ORAL_TABLET | Freq: Three times a day (TID) | ORAL | Status: DC

## 2012-09-28 NOTE — Progress Notes (Signed)
Reason for visit: Gait disorder  Kimberly Bass is an 79 y.o. female  History of present illness:  Kimberly Bass is an 79 year old left-handed white female with a history of an essential tremor, and a gait disorder. The patient has had myoclonic type jerks in the legs that have resulted in some issues with balance. The patient has done better with the baclofen, and she is on gabapentin as well for the tremor. The patient wishes to try to get back on some of her medications. In general, the patient believes that she is doing very well since last seen, and she does not report any falls. The patient believes that the tremor is somewhat better. The patient returns for an evaluation. She denies any new medical issues that have come up since last seen.  Past Medical History  Diagnosis Date  . Coronary artery disease   . Hyperlipidemia   . Thyroid disease   . Hypertension   . Hx of transient ischemic attack (TIA)   . Hyperthyroidism   . Lumbosacral spondylosis   . Mild obesity   . Benign essential tremor   . Lower extremity myoclonus   . Seizures     History is questionable  . Myoclonus     Lower extremity involvement  . H/O radioactive iodine thyroid ablation   . Obesity   . Gait disorder     Past Surgical History  Procedure Laterality Date  . Cardiac catheterization  01/03/2006  . Hx back surgery    . Tonsillectomy    . Appendectomy    . Abdominal hysterectomy    . Breast lumpectomy    . Myocardial infarction      Family History  Problem Relation Age of Onset  . Heart disease Father   . Heart attack Mother   . Stroke Sister   . Heart attack Sister   . Heart attack Brother     Possible  . Diabetes Sister     Social history:  reports that she has never smoked. She does not have any smokeless tobacco history on file. She reports that she does not drink alcohol. Her drug history is not on file.  Allergies:  Allergies  Allergen Reactions  . Codeine   .  Meperidine Hcl   . Morphine   . Penicillins     Medications:  Current Outpatient Prescriptions on File Prior to Visit  Medication Sig Dispense Refill  . acetaminophen (TYLENOL ARTHRITIS PAIN) 650 MG CR tablet Take 650 mg by mouth every 8 (eight) hours as needed. Taking 2 tid       . amLODipine (NORVASC) 5 MG tablet Take 5 mg by mouth daily.        Marland Kitchen aspirin 81 MG tablet Take 81 mg by mouth daily.        . Calcium Carbonate-Vitamin D (CALCIUM + D PO) Take by mouth 2 (two) times daily.        . clopidogrel (PLAVIX) 75 MG tablet Take 1 tablet (75 mg total) by mouth daily.  90 tablet  3  . isosorbide mononitrate (IMDUR) 60 MG 24 hr tablet Take 1 tablet (60 mg total) by mouth every morning.  90 tablet  3  . levothyroxine (SYNTHROID, LEVOTHROID) 75 MCG tablet Take 75 mcg by mouth daily.        . metoprolol (LOPRESSOR) 100 MG tablet Take 1 tablet (100 mg total) by mouth 2 (two) times daily.  180 tablet  3  . Multiple Vitamin (MULTIVITAMIN) tablet  Take 1 tablet by mouth daily.        Marland Kitchen NITROSTAT 0.4 MG SL tablet DISSOLVE ONE TABLET UNDER THE TONGUE EVERY 5 MINUTES AS NEEDED FOR CHEST PAIN.  DO NOT EXCEED A TOTAL OF 3 DOSES IN 15 MINUTES  25 each  3  . Potassium 75 MG TABS Take 90 mg by mouth daily.        . ranitidine (ZANTAC) 150 MG tablet Take 150 mg by mouth 2 (two) times daily.        . simvastatin (ZOCOR) 20 MG tablet Take 20 mg by mouth at bedtime.        . triamterene-hydrochlorothiazide (MAXZIDE-25) 37.5-25 MG per tablet Take 1 tablet by mouth daily.         No current facility-administered medications on file prior to visit.    ROS:  Out of a complete 14 system review of symptoms, the patient complains only of the following symptoms, and all other reviewed systems are negative.  Weight gain Easy bruising Tremor Gait disturbance  Blood pressure 116/68, pulse 65, height 4\' 11"  (1.499 m), weight 168 lb (76.204 kg).  Physical Exam  General: The patient is alert and cooperative at  the time of the examination. The patient is moderately obese.  Skin: No significant peripheral edema is noted.   Neurologic Exam  Cranial nerves: Facial symmetry is present. No aphasia or dysarthria is noted. The patient does have a vocal tremor. Extraocular movements are full. Visual fields are full. The patient has a side to side head and neck tremor.  Motor: The patient has good strength in all 4 extremities.  Coordination: The patient has good finger-nose-finger and heel-to-shin bilaterally. No significant tremor with the upper extremities is noted.  Gait and station: The patient ambulates with a walker, and gait is slightly wide-based. The patient is unable to perform tandem gait. Romberg is negative. No drift is seen.  Reflexes: Deep tendon reflexes are symmetric, but are depressed throughout..   Assessment/Plan:  1. Essential tremor  2. Gait disorder  The patient is doing somewhat better at this time. The patient will cut back on the gabapentin taking 300 mg 3 times daily. The patient may be a will to slowly taper off of this medication. For now, the patient will continue the baclofen. The patient will followup in 6-8 months. The patient will contact me if she has any further issues.  Marlan Palau MD 09/28/2012 7:36 PM  [facility] [address] [place], [place] [address]  Phone [phone] Fax [phone]

## 2012-09-28 NOTE — Patient Instructions (Signed)
  We will have your reduce the gabapentin to 300 mg three times a day

## 2012-10-10 ENCOUNTER — Other Ambulatory Visit: Payer: Self-pay | Admitting: *Deleted

## 2012-10-10 ENCOUNTER — Telehealth: Payer: Self-pay | Admitting: Cardiovascular Disease

## 2012-10-10 DIAGNOSIS — I2510 Atherosclerotic heart disease of native coronary artery without angina pectoris: Secondary | ICD-10-CM

## 2012-10-10 MED ORDER — METOPROLOL TARTRATE 100 MG PO TABS: 100.0000 mg | ORAL_TABLET | Freq: Two times a day (BID) | ORAL | Status: DC

## 2012-10-10 MED ORDER — ISOSORBIDE MONONITRATE ER 60 MG PO TB24: 60.0000 mg | ORAL_TABLET | ORAL | Status: DC

## 2012-10-10 NOTE — Telephone Encounter (Signed)
Fax Received. Refill Completed. Kimberly Bass (R.M.A)  NEED APPOINTMENT 

## 2012-10-10 NOTE — Telephone Encounter (Signed)
Walk in pt Form " [facility]" Dropped Off gave to Jodette/Nahser  10/10/12/KM

## 2012-10-11 ENCOUNTER — Other Ambulatory Visit: Payer: Self-pay

## 2012-10-11 MED ORDER — NITROSTAT 0.4 MG SL SUBL: SUBLINGUAL_TABLET | SUBLINGUAL | Status: DC

## 2012-11-03 ENCOUNTER — Telehealth: Payer: Self-pay | Admitting: Cardiovascular Disease

## 2012-11-03 DIAGNOSIS — I2510 Atherosclerotic heart disease of native coronary artery without angina pectoris: Secondary | ICD-10-CM

## 2012-11-03 NOTE — Telephone Encounter (Signed)
Patient rec'd a letter from [facility] that stated she needed to contact Dr. Harvie Bridge office. Patient states she "thinks it is because I had my Nitrostat refilled".  Patient states that she has only had to use it once recently but wanted it refilled "just in case". Patient states that she saw Dr. Elease Hashimoto in April and he advised her that she needed to see him in one year. Her chart shows she last saw Dr. Elease Hashimoto in July, 2013, and he advised he wanted to see her back in six months. Patient states she remembers this differently and she feels she saw him in April of 2014 and she is not to return until next year. Advised the patient that Dr. Harvie Bridge nurse would be made aware of the above information and contact her for followup information, if applicable. Patient denies problems or concerns at this time.

## 2012-11-03 NOTE — Telephone Encounter (Signed)
New problem   Pt was sent a letter and told to contact Dr Elease Hashimoto office and it was from pharmacy. Please call pt

## 2012-11-06 MED ORDER — ISOSORBIDE MONONITRATE ER 60 MG PO TB24: 60.0000 mg | ORAL_TABLET | ORAL | Status: DC

## 2012-11-06 MED ORDER — METOPROLOL TARTRATE 100 MG PO TABS: 100.0000 mg | ORAL_TABLET | Freq: Two times a day (BID) | ORAL | Status: DC

## 2012-11-06 NOTE — Addendum Note (Signed)
Addended by: Antony Odea on: 11/06/2012 11:02 AM   Modules accepted: Orders

## 2012-11-06 NOTE — Telephone Encounter (Signed)
Needs app

## 2012-11-06 NOTE — Telephone Encounter (Signed)
App given, refills done to insure she wont run out till seen.

## 2012-11-13 ENCOUNTER — Other Ambulatory Visit: Payer: Self-pay

## 2012-11-13 DIAGNOSIS — Z1231 Encounter for screening mammogram for malignant neoplasm of breast: Secondary | ICD-10-CM

## 2012-11-16 ENCOUNTER — Telehealth: Payer: Self-pay | Admitting: Cardiovascular Disease

## 2012-11-16 ENCOUNTER — Other Ambulatory Visit: Payer: Self-pay | Admitting: *Deleted

## 2012-11-16 MED ORDER — CLOPIDOGREL BISULFATE 75 MG PO TABS: 75.0000 mg | ORAL_TABLET | Freq: Every day | ORAL | Status: DC

## 2012-11-16 NOTE — Telephone Encounter (Signed)
Walk In pt Form " [facility]" sent to [facility]  11/16/12/KM

## 2012-11-16 NOTE — Telephone Encounter (Signed)
Pt plavix was not on his medication list. I looked on his last OV note, it was listed and was not stopped of removed. Fax Received. Refill Completed. Kimberly Bass (R.M.A)  No refills until appointment

## 2012-12-14 ENCOUNTER — Ambulatory Visit
Admission: RE | Admit: 2012-12-14 | Discharge: 2012-12-14 | Disposition: A | Discharge status: Home / Self Care | Payer: Medicare Other | Source: Ambulatory Visit

## 2012-12-14 DIAGNOSIS — Z1231 Encounter for screening mammogram for malignant neoplasm of breast: Secondary | ICD-10-CM

## 2012-12-27 ENCOUNTER — Encounter: Payer: Self-pay | Admitting: Cardiovascular Disease

## 2012-12-27 ENCOUNTER — Ambulatory Visit (INDEPENDENT_AMBULATORY_CARE_PROVIDER_SITE_OTHER): Payer: Medicare Other | Admitting: Cardiovascular Disease

## 2012-12-27 VITALS — BP 134/80 | HR 81 | Ht 59.0 in | Wt 172.0 lb

## 2012-12-27 DIAGNOSIS — I2510 Atherosclerotic heart disease of native coronary artery without angina pectoris: Secondary | ICD-10-CM

## 2012-12-27 MED ORDER — METOPROLOL TARTRATE 100 MG PO TABS: 100.0000 mg | ORAL_TABLET | Freq: Two times a day (BID) | ORAL | Status: DC

## 2012-12-27 MED ORDER — CLOPIDOGREL BISULFATE 75 MG PO TABS: 75.0000 mg | ORAL_TABLET | Freq: Every day | ORAL | Status: DC

## 2012-12-27 MED ORDER — ISOSORBIDE MONONITRATE ER 60 MG PO TB24: 60.0000 mg | ORAL_TABLET | ORAL | Status: DC

## 2012-12-27 NOTE — Patient Instructions (Addendum)
Your physician wants you to follow-up in: 1 YEAR WITH EKG  You will receive a reminder letter in the mail two months in advance. If you don't receive a letter, please call our office to schedule the follow-up appointment.   Your physician recommends that you continue on your current medications as directed. Please refer to the Current Medication list given to you today.  

## 2012-12-27 NOTE — Progress Notes (Signed)
Kimberly Bass Date of Birth  1925-08-25       [facility]    [place] [address]  [address] [place], [place]  [address]   [place], [place]  [address] [phone]     [phone]   Fax  [phone]    Fax [phone]  Problem List: 1. Coronary artery disease-is a tight stenosis in the distal aspect of her circumflex artery 2. Hyperthyroidism 3. Hyperlipidemia 4. Hypertension  History of Present Illness: Kimberly Bass is an 79 yo with a hx of CAD. She had a heart catheterization on January 03, 2006. She has a very tight stenosis in the terminal aspect of a large posterior lateral branch. This stenosis is very tortuous and is diffusely diseased and is not a candidate for PCI.  She's not had any episodes of chest pain or shortness breath. She's been able to do all of her normal activities without any limitations. She used  to have a good bit of angina but has not noticed any in the past several months.  December 27, 2012:  Kimberly Bass is 79 doing OK. is doing OK.  She is getting weaker - came in with a walker today.  She is able to walk shorter distances on her own.  She has leg weakness walking long distances.    She has not had to use her NTG much - on occasion.     Current Outpatient Prescriptions on File Prior to Visit  Medication Sig Dispense Refill  . acetaminophen (TYLENOL ARTHRITIS PAIN) 650 MG CR tablet Take 650 mg by mouth every 8 (eight) hours as needed. Taking 2 tid       . amLODipine (NORVASC) 5 MG tablet Take 5 mg by mouth daily.        Marland Kitchen aspirin 81 MG tablet Take 81 mg by mouth daily.        . baclofen (LIORESAL) 20 MG tablet Take 1 tablet (20 mg total) by mouth 3 (three) times daily. Taking  One and one-half in the am ,one at noon and one and one half pm  270 tablet  3  . Calcium Carbonate-Vitamin D (CALCIUM + D PO) Take by mouth 2 (two) times daily.        . clopidogrel (PLAVIX) 75 MG tablet Take 1 tablet (75 mg total) by mouth daily.   90 tablet  0  . gabapentin (NEURONTIN) 300 MG capsule Take 1 capsule (300 mg total) by mouth 3 (three) times daily. Taking 1 am 2 at noon and 1 hs  270 capsule  3  . isosorbide mononitrate (IMDUR) 60 MG 24 hr tablet Take 1 tablet (60 mg total) by mouth every morning.  90 tablet  0  . levothyroxine (SYNTHROID, LEVOTHROID) 75 MCG tablet Take 75 mcg by mouth daily.        . metoprolol (LOPRESSOR) 100 MG tablet Take 1 tablet (100 mg total) by mouth 2 (two) times daily.  180 tablet  0  . mirabegron ER (MYRBETRIQ) 25 MG TB24 Take 25 mg by mouth daily.      . Multiple Vitamin (MULTIVITAMIN) tablet Take 1 tablet by mouth daily.        Marland Kitchen NITROSTAT 0.4 MG SL tablet DISSOLVE ONE TABLET UNDER THE TONGUE EVERY 5 MINUTES AS NEEDED FOR CHEST PAIN.  DO NOT EXCEED A TOTAL OF 3 DOSES IN 15 MINUTES  25 tablet  1  . Potassium 75 MG TABS Take 90 mg by mouth daily.        Marland Kitchen  ranitidine (ZANTAC) 150 MG tablet Take 150 mg by mouth 2 (two) times daily.        . simvastatin (ZOCOR) 20 MG tablet Take 20 mg by mouth at bedtime.        . traZODone (DESYREL) 50 MG tablet Take 50 mg by mouth at bedtime.      . triamterene-hydrochlorothiazide (MAXZIDE-25) 37.5-25 MG per tablet Take 1 tablet by mouth daily.         No current facility-administered medications on file prior to visit.    Allergies  Allergen Reactions  . Codeine   . Meperidine Hcl   . Morphine   . Penicillins     Past Medical History  Diagnosis Date  . Coronary artery disease   . Hyperlipidemia   . Thyroid disease   . Hypertension   . Hx of transient ischemic attack (TIA)   . Hyperthyroidism   . Lumbosacral spondylosis   . Mild obesity   . Benign essential tremor   . Lower extremity myoclonus   . Seizures     History is questionable  . Myoclonus     Lower extremity involvement  . H/O radioactive iodine thyroid ablation   . Obesity   . Gait disorder     Past Surgical History  Procedure Laterality Date  . Cardiac catheterization   01/03/2006  . Hx back surgery    . Tonsillectomy    . Appendectomy    . Abdominal hysterectomy    . Breast lumpectomy    . Myocardial infarction      History  Smoking status  . Never Smoker   Smokeless tobacco  . Not on file    History  Alcohol Use No    Family History  Problem Relation Age of Onset  . Heart disease Father   . Heart attack Mother   . Stroke Sister   . Heart attack Sister   . Heart attack Brother     Possible  . Diabetes Sister     Reviw of Systems:  Reviewed in the HPI.  All other systems are negative.  Physical Exam: Blood pressure 134/80, pulse 81, height 4\' 11"  (1.499 m), weight 172 lb (78.019 kg), SpO2 96.00%. General: Well developed, well nourished, in no acute distress.  She has a benign tremor.  Head: Normocephalic, atraumatic, sclera non-icteric, mucus membranes are moist,   Neck: Supple. Carotids are 2 + without bruits. No JVD  Lungs: Clear bilaterally to auscultation.  Heart: regular rate.  normal  S1 S2. No murmurs, gallops or rubs.  Abdomen: Soft, non-tender, non-distended with normal bowel sounds. No hepatomegaly. No rebound/guarding. No masses.  Msk:  Strength and tone are normal  Extremities: No clubbing or cyanosis. No edema.  Distal pedal pulses are 2+ and equal bilaterally.  Neuro: Alert and oriented X 3. Moves all extremities spontaneously.  Psych:  Responds to questions appropriately with a normal affect.  ECG: 12/27/12:  NSR at 78,  No ST or T wave changes.  Assessment / Plan:

## 2012-12-27 NOTE — Assessment & Plan Note (Signed)
Overall, [name] is doing well.  She has to take NTG on occasion but not very often.   Mostly due to stressful situations. Overall she is doing well.    I will see her in 1 year for OV and ECG

## 2013-04-02 ENCOUNTER — Telehealth: Payer: Self-pay | Admitting: Cardiovascular Disease

## 2013-04-02 NOTE — Telephone Encounter (Signed)
Walk In Pt Form " [facility]" paperwork rec gave to [facility]  04/02/13/KM

## 2013-04-05 ENCOUNTER — Encounter: Payer: Self-pay | Admitting: Neurology

## 2013-04-05 ENCOUNTER — Ambulatory Visit (INDEPENDENT_AMBULATORY_CARE_PROVIDER_SITE_OTHER): Payer: Medicare Other | Admitting: Neurology

## 2013-04-05 VITALS — BP 131/79 | HR 71 | Temp 97.9°F | Wt 172.0 lb

## 2013-04-05 DIAGNOSIS — R269 Unspecified abnormalities of gait and mobility: Secondary | ICD-10-CM

## 2013-04-05 DIAGNOSIS — G250 Essential tremor: Secondary | ICD-10-CM

## 2013-04-05 NOTE — Progress Notes (Signed)
Reason for visit: Gait disorder  Kimberly Bass is an 79 y.o. female  History of present illness:  Kimberly Bass is an 79 year old left-handed white female with a history of an essential tremor, and a gait disorder. The patient has had myoclonic type jerks in the legs that have resulted in some issues with balance. The patient has done better with the baclofen, and she is on gabapentin as well for the tremor. In general, the patient believes that she is doing very well since last seen, and she does not report any falls. The patient believes that the tremor is somewhat better. The patient returns for an evaluation. She denies any new medical issues that have come up since last seen. The patient is tolerating her medications well.  Past Medical History  Diagnosis Date  . Coronary artery disease   . Hyperlipidemia   . Thyroid disease   . Hypertension   . Hx of transient ischemic attack (TIA)   . Hyperthyroidism   . Lumbosacral spondylosis   . Mild obesity   . Benign essential tremor   . Lower extremity myoclonus   . Seizures     History is questionable  . Myoclonus     Lower extremity involvement  . H/O radioactive iodine thyroid ablation   . Obesity   . Gait disorder     Past Surgical History  Procedure Laterality Date  . Cardiac catheterization  01/03/2006  . Hx back surgery    . Tonsillectomy    . Appendectomy    . Abdominal hysterectomy    . Breast lumpectomy    . Myocardial infarction      Family History  Problem Relation Age of Onset  . Heart disease Father   . Heart attack Mother   . Stroke Sister   . Heart attack Sister   . Heart attack Brother     Possible  . Diabetes Sister     Social history:  reports that she has never smoked. She has never used smokeless tobacco. She reports that she does not drink alcohol or use illicit drugs.  Allergies:  Allergies  Allergen Reactions  . Codeine   . Meperidine Hcl   . Morphine   . Penicillins      Medications:  Current Outpatient Prescriptions on File Prior to Visit  Medication Sig Dispense Refill  . acetaminophen (TYLENOL ARTHRITIS PAIN) 650 MG CR tablet Take 650 mg by mouth every 8 (eight) hours as needed. Taking 2 tid       . amLODipine (NORVASC) 5 MG tablet Take 5 mg by mouth daily.        Marland Kitchen aspirin 81 MG tablet Take 81 mg by mouth daily.        . baclofen (LIORESAL) 20 MG tablet Take 1 tablet (20 mg total) by mouth 3 (three) times daily. Taking  One and one-half in the am ,one at noon and one and one half pm  270 tablet  3  . Calcium Carbonate-Vitamin D (CALCIUM + D PO) Take by mouth 2 (two) times daily.        . clopidogrel (PLAVIX) 75 MG tablet Take 1 tablet (75 mg total) by mouth daily.  90 tablet  3  . isosorbide mononitrate (IMDUR) 60 MG 24 hr tablet Take 1 tablet (60 mg total) by mouth every morning.  90 tablet  3  . levothyroxine (SYNTHROID, LEVOTHROID) 75 MCG tablet Take 75 mcg by mouth daily.        Marland Kitchen  metoprolol (LOPRESSOR) 100 MG tablet Take 1 tablet (100 mg total) by mouth 2 (two) times daily.  180 tablet  3  . mirabegron ER (MYRBETRIQ) 25 MG TB24 Take 25 mg by mouth daily.      . Multiple Vitamin (MULTIVITAMIN) tablet Take 1 tablet by mouth daily.        Marland Kitchen NITROSTAT 0.4 MG SL tablet DISSOLVE ONE TABLET UNDER THE TONGUE EVERY 5 MINUTES AS NEEDED FOR CHEST PAIN.  DO NOT EXCEED A TOTAL OF 3 DOSES IN 15 MINUTES  25 tablet  1  . Potassium 75 MG TABS Take 90 mg by mouth daily.        . ranitidine (ZANTAC) 150 MG tablet Take 150 mg by mouth 2 (two) times daily.        . simvastatin (ZOCOR) 20 MG tablet Take 20 mg by mouth at bedtime.        . traZODone (DESYREL) 50 MG tablet Take 50 mg by mouth at bedtime.      . triamterene-hydrochlorothiazide (MAXZIDE-25) 37.5-25 MG per tablet Take 1 tablet by mouth daily.         No current facility-administered medications on file prior to visit.    ROS:  Out of a complete 14 system review of symptoms, the patient complains only  of the following symptoms, and all other reviewed systems are negative.  Weight gain Easy bruising Tremor Gait disturbance  Blood pressure 131/79, pulse 71, temperature 97.9 F (36.6 C), temperature source Oral, weight 172 lb (78.019 kg).  Physical Exam  General: The patient is alert and cooperative at the time of the examination. The patient is moderately obese.  Skin: No significant peripheral edema is noted.   Neurologic Exam  Cranial nerves: Facial symmetry is present. No aphasia or dysarthria is noted. The patient does have a vocal tremor. Extraocular movements are full. Visual fields are full. The patient has a side to side head and neck tremor.  Motor: The patient has good strength in all 4 extremities.  Coordination: The patient has good finger-nose-finger and heel-to-shin bilaterally. No significant tremor with the upper extremities is noted.  Gait and station: The patient ambulates with a walker, and gait is slightly wide-based. The patient is unable to perform tandem gait. Romberg is negative. No drift is seen.  Reflexes: Deep tendon reflexes are symmetric, but are depressed throughout..   Assessment/Plan:  1. Essential tremor  2. Gait disorder  The patient is doing somewhat better at this time. The patient will cut back on the gabapentin taking 300 mg 3 times daily. The patient never cut back on the medication when seen last. The patient does report some weight gain on the medication.  For now, the patient will continue the baclofen. The patient will followup in 12 months. The patient will contact me if she has any further issues.  Marlan Palau MD 04/05/2013 11:56 AM  [facility] [address] [place], [place] [address]  Phone [phone] Fax [phone]

## 2013-04-05 NOTE — Patient Instructions (Signed)
Reduce the gabapentin to 300 mg three times a day. 

## 2013-04-24 ENCOUNTER — Other Ambulatory Visit: Payer: Self-pay | Admitting: Cardiovascular Disease

## 2013-05-08 ENCOUNTER — Other Ambulatory Visit: Payer: Self-pay | Admitting: Cardiovascular Disease

## 2013-05-14 ENCOUNTER — Other Ambulatory Visit: Payer: Self-pay | Admitting: *Deleted

## 2013-05-14 MED ORDER — CLOPIDOGREL BISULFATE 75 MG PO TABS: 75.0000 mg | ORAL_TABLET | Freq: Every day | ORAL | Status: DC

## 2013-05-15 ENCOUNTER — Other Ambulatory Visit: Payer: Self-pay | Admitting: Cardiovascular Disease

## 2013-07-03 ENCOUNTER — Other Ambulatory Visit: Payer: Self-pay | Admitting: Neurology

## 2013-08-09 ENCOUNTER — Other Ambulatory Visit (HOSPITAL_COMMUNITY): Payer: Self-pay | Admitting: Endocrinology

## 2013-08-09 DIAGNOSIS — E049 Nontoxic goiter, unspecified: Secondary | ICD-10-CM

## 2013-10-01 ENCOUNTER — Other Ambulatory Visit: Payer: Self-pay | Admitting: Neurology

## 2013-11-14 ENCOUNTER — Other Ambulatory Visit: Payer: Self-pay

## 2013-11-14 DIAGNOSIS — Z1231 Encounter for screening mammogram for malignant neoplasm of breast: Secondary | ICD-10-CM

## 2013-12-18 ENCOUNTER — Encounter (INDEPENDENT_AMBULATORY_CARE_PROVIDER_SITE_OTHER): Payer: Self-pay

## 2013-12-18 ENCOUNTER — Ambulatory Visit
Admission: RE | Admit: 2013-12-18 | Discharge: 2013-12-18 | Disposition: A | Discharge status: Home / Self Care | Payer: Medicare Other | Source: Ambulatory Visit

## 2013-12-18 DIAGNOSIS — Z1231 Encounter for screening mammogram for malignant neoplasm of breast: Secondary | ICD-10-CM

## 2014-01-01 ENCOUNTER — Other Ambulatory Visit: Payer: Self-pay | Admitting: Cardiovascular Disease

## 2014-01-10 ENCOUNTER — Encounter: Payer: Self-pay | Admitting: Cardiovascular Disease

## 2014-01-10 ENCOUNTER — Ambulatory Visit (INDEPENDENT_AMBULATORY_CARE_PROVIDER_SITE_OTHER): Payer: Medicare Other | Admitting: Cardiovascular Disease

## 2014-01-10 VITALS — BP 120/38 | HR 70 | Ht 59.0 in | Wt 171.4 lb

## 2014-01-10 DIAGNOSIS — I2510 Atherosclerotic heart disease of native coronary artery without angina pectoris: Secondary | ICD-10-CM

## 2014-01-10 DIAGNOSIS — R002 Palpitations: Secondary | ICD-10-CM

## 2014-01-10 NOTE — Assessment & Plan Note (Signed)
She has occasional episodes of tachycardia. Well controlled.

## 2014-01-10 NOTE — Progress Notes (Signed)
Kimberly BrownieJeanneane H Bass Date of Birth  06/09/1926       [facility]    [place] [facility] [address]  [address], [address] [place], [place]  [address]   [place], [place]  [id number] [phone]     [phone]   Fax  [phone]    Fax [phone]  Problem List: 1. Coronary artery disease-is a tight stenosis in the distal aspect of her circumflex artery 2. Hyperthyroidism 3. Hyperlipidemia 4. Hypertension  History of Present Illness: Kimberly Bass is an 80 yo with a hx of CAD. She had a heart catheterization on January 03, 2006. She has a very tight stenosis in the terminal aspect of a large posterior lateral branch. This stenosis is very tortuous and is diffusely diseased and is not a candidate for PCI.  She's not had any episodes of chest pain or shortness breath. She's been able to do all of her normal activities without any limitations. She used  to have a good bit of angina but has not noticed any in the past several months.  December 27, 2012:  Kimberly Bass is doing OK.  She is getting weaker - came in with a walker today.  She is able to walk shorter distances on her own.  She has leg weakness walking long distances.    She has not had to use her NTG much - on occasion.     01/10/2014: Pt is doing ok.  Still using the walker for long distance walking.  Able to get around the house without.  No CP.   BP is OK   Current Outpatient Prescriptions on File Prior to Visit  Medication Sig Dispense Refill  . acetaminophen (TYLENOL ARTHRITIS PAIN) 650 MG CR tablet Take 650 mg by mouth every 8 (eight) hours as needed. Taking 2 tid       . amLODipine (NORVASC) 5 MG tablet Take 5 mg by mouth daily.        Marland Kitchen. aspirin 81 MG tablet Take 81 mg by mouth daily.        . baclofen (LIORESAL) 20 MG tablet Take 1 and 1/2 tablets by  mouth in the morning and in the evening and 1 tablet at noon  360 each  1  . Calcium Carbonate-Vitamin D (CALCIUM + D PO) Take by mouth 2 (two)  times daily.        . clopidogrel (PLAVIX) 75 MG tablet Take 1 tablet by mouth  daily  90 tablet  3  . gabapentin (NEURONTIN) 300 MG capsule Take 1 capsule by mouth 3  times daily  270 capsule  1  . isosorbide mononitrate (IMDUR) 60 MG 24 hr tablet Take 1 tablet (60 mg total) by mouth every morning.  90 tablet  3  . levothyroxine (SYNTHROID, LEVOTHROID) 75 MCG tablet Take 75 mcg by mouth daily.        . metoprolol (LOPRESSOR) 100 MG tablet Take 1 tablet by mouth  twice a day  180 tablet  0  . Multiple Vitamin (MULTIVITAMIN) tablet Take 1 tablet by mouth daily.        Marland Kitchen. NITROSTAT 0.4 MG SL tablet DISSOLVE ONE TABLET UNDER THE TONGUE EVERY 5 MINUTES AS NEEDED FOR CHEST PAIN.  DO NOT EXCEED A TOTAL OF 3 DOSES IN 15 MINUTES  25 tablet  1  . ranitidine (ZANTAC) 150 MG tablet Take 150 mg by mouth 2 (two) times daily.        .Marland Kitchen  simvastatin (ZOCOR) 20 MG tablet Take 20 mg by mouth at bedtime.        . traZODone (DESYREL) 50 MG tablet Take 50 mg by mouth at bedtime.      . triamterene-hydrochlorothiazide (MAXZIDE-25) 37.5-25 MG per tablet Take 1 tablet by mouth daily.        . vitamin B-12 (CYANOCOBALAMIN) 1000 MCG tablet Take 1,000 mcg by mouth daily.       No current facility-administered medications on file prior to visit.    Allergies  Allergen Reactions  . Codeine   . Meperidine Hcl   . Morphine   . Penicillins     Past Medical History  Diagnosis Date  . Coronary artery disease   . Hyperlipidemia   . Thyroid disease   . Hypertension   . Hx of transient ischemic attack (TIA)   . Hyperthyroidism   . Lumbosacral spondylosis   . Mild obesity   . Benign essential tremor   . Lower extremity myoclonus   . Seizures     History is questionable  . Myoclonus     Lower extremity involvement  . H/O radioactive iodine thyroid ablation   . Obesity   . Gait disorder     Past Surgical History  Procedure Laterality Date  . Cardiac catheterization  01/03/2006  . Hx back surgery    .  Tonsillectomy    . Appendectomy    . Abdominal hysterectomy    . Breast lumpectomy    . Myocardial infarction      History  Smoking status  . Never Smoker   Smokeless tobacco  . Never Used    History  Alcohol Use No    Family History  Problem Relation Age of Onset  . Heart disease Father   . Heart attack Mother   . Stroke Sister   . Heart attack Sister   . Heart attack Brother     Possible  . Diabetes Sister     Reviw of Systems:  Reviewed in the HPI.  All other systems are negative.  Physical Exam: Blood pressure 120/38, pulse 70, height 4\' 11"  (1.499 m), weight 171 lb 6.4 oz (77.747 kg). General: Well developed, well nourished, in no acute distress.  She has a benign tremor.  Head: Normocephalic, atraumatic, sclera non-icteric, mucus membranes are moist,   Neck: Supple. Carotids are 2 + without bruits. No JVD  Lungs: Clear bilaterally to auscultation.  Heart: regular rate.  normal  S1 S2. No murmurs, gallops or rubs.  Abdomen: Soft, non-tender, non-distended with normal bowel sounds. No hepatomegaly. No rebound/guarding. No masses.  Msk:  Strength and tone are normal  Extremities: No clubbing or cyanosis. No edema.  Distal pedal pulses are 2+ and equal bilaterally.  Neuro: Alert and oriented X 3. Moves all extremities spontaneously.  Psych:  Responds to questions appropriately with a normal affect.  ECG: 01/10/2014: Normal sinus rhythm at 70. His left axis deviation. s.  Assessment / Plan:

## 2014-01-10 NOTE — Patient Instructions (Signed)
Your physician recommends that you continue on your current medications as directed. Please refer to the Current Medication list given to you today.  Your physician wants you to follow-up in: 1 year with Dr. Nahser.  You will receive a reminder letter in the mail two months in advance. If you don't receive a letter, please call our office to schedule the follow-up appointment.  

## 2014-01-10 NOTE — Assessment & Plan Note (Signed)
She is very stable .  No angina. She is gradually slowing down.   We will continue conservative management.

## 2014-01-16 ENCOUNTER — Telehealth: Payer: Self-pay | Admitting: Cardiovascular Disease

## 2014-01-16 NOTE — Telephone Encounter (Signed)
Walk in pt Form " [facility]" Dropped Off By Pt [facility]   7.22.15/km

## 2014-02-20 ENCOUNTER — Other Ambulatory Visit (HOSPITAL_COMMUNITY): Payer: Self-pay | Admitting: Internal Medicine

## 2014-02-20 DIAGNOSIS — R109 Unspecified abdominal pain: Secondary | ICD-10-CM

## 2014-02-22 ENCOUNTER — Ambulatory Visit (HOSPITAL_COMMUNITY)
Admission: RE | Admit: 2014-02-22 | Discharge: 2014-02-22 | Disposition: A | Discharge status: Home / Self Care | Payer: Medicare Other | Source: Ambulatory Visit | Attending: Internal Medicine | Admitting: Internal Medicine

## 2014-02-22 DIAGNOSIS — K769 Liver disease, unspecified: Secondary | ICD-10-CM | POA: Diagnosis not present

## 2014-02-22 DIAGNOSIS — R141 Gas pain: Secondary | ICD-10-CM | POA: Insufficient documentation

## 2014-02-22 DIAGNOSIS — R109 Unspecified abdominal pain: Secondary | ICD-10-CM | POA: Diagnosis present

## 2014-02-22 DIAGNOSIS — K7689 Other specified diseases of liver: Secondary | ICD-10-CM | POA: Insufficient documentation

## 2014-02-22 DIAGNOSIS — R143 Flatulence: Secondary | ICD-10-CM

## 2014-02-22 DIAGNOSIS — R142 Eructation: Secondary | ICD-10-CM

## 2014-02-27 ENCOUNTER — Telehealth: Payer: Self-pay | Admitting: Cardiovascular Disease

## 2014-02-28 ENCOUNTER — Other Ambulatory Visit: Payer: Self-pay

## 2014-02-28 ENCOUNTER — Other Ambulatory Visit (HOSPITAL_COMMUNITY): Payer: Self-pay | Admitting: Internal Medicine

## 2014-02-28 ENCOUNTER — Ambulatory Visit (HOSPITAL_COMMUNITY)
Admission: RE | Admit: 2014-02-28 | Discharge: 2014-02-28 | Disposition: A | Discharge status: Home / Self Care | Payer: Medicare Other | Source: Ambulatory Visit | Attending: Internal Medicine | Admitting: Internal Medicine

## 2014-02-28 ENCOUNTER — Telehealth: Payer: Self-pay | Admitting: Nurse Practitioner

## 2014-02-28 ENCOUNTER — Other Ambulatory Visit (HOSPITAL_COMMUNITY): Payer: Medicare Other

## 2014-02-28 ENCOUNTER — Ambulatory Visit (HOSPITAL_COMMUNITY)
Admission: RE | Admit: 2014-02-28 | Discharge: 2014-02-28 | Disposition: A | Discharge status: Home / Self Care | Payer: Medicare Other | Source: Ambulatory Visit | Attending: Endocrinology | Admitting: Endocrinology

## 2014-02-28 DIAGNOSIS — R599 Enlarged lymph nodes, unspecified: Secondary | ICD-10-CM | POA: Diagnosis not present

## 2014-02-28 DIAGNOSIS — E049 Nontoxic goiter, unspecified: Secondary | ICD-10-CM | POA: Insufficient documentation

## 2014-02-28 DIAGNOSIS — R109 Unspecified abdominal pain: Secondary | ICD-10-CM

## 2014-02-28 MED ORDER — BACLOFEN 20 MG PO TABS: ORAL_TABLET | ORAL | Status: DC

## 2014-02-28 MED ORDER — GABAPENTIN 300 MG PO CAPS: ORAL_CAPSULE | ORAL | Status: DC

## 2014-02-28 NOTE — Telephone Encounter (Signed)
Spoke with Dena C. And document was for patient not Mr Fran Lowes, name entered in error.  Coy Saunas [facility] for given to [place].

## 2014-02-28 NOTE — Telephone Encounter (Signed)
Walk-In Patient form received from front reception with Mr. Scot Dock document from [facility] attached: given to Michelle/Nasher on 9.3.15:djc

## 2014-02-28 NOTE — Telephone Encounter (Signed)
error 

## 2014-04-08 ENCOUNTER — Encounter (INDEPENDENT_AMBULATORY_CARE_PROVIDER_SITE_OTHER): Payer: Self-pay

## 2014-04-08 ENCOUNTER — Encounter: Payer: Self-pay | Admitting: Neurology

## 2014-04-08 ENCOUNTER — Ambulatory Visit (INDEPENDENT_AMBULATORY_CARE_PROVIDER_SITE_OTHER): Payer: Medicare Other | Admitting: Neurology

## 2014-04-08 VITALS — BP 129/79 | HR 66 | Ht 59.0 in | Wt 172.8 lb

## 2014-04-08 DIAGNOSIS — R251 Tremor, unspecified: Secondary | ICD-10-CM

## 2014-04-08 DIAGNOSIS — R269 Unspecified abnormalities of gait and mobility: Secondary | ICD-10-CM

## 2014-04-08 DIAGNOSIS — G252 Other specified forms of tremor: Secondary | ICD-10-CM

## 2014-04-08 DIAGNOSIS — G250 Essential tremor: Secondary | ICD-10-CM

## 2014-04-08 MED ORDER — GABAPENTIN 300 MG PO CAPS
300.0000 mg | ORAL_CAPSULE | Freq: Four times a day (QID) | ORAL | Status: DC
Start: 2014-04-08 — End: 2014-10-09

## 2014-04-08 NOTE — Patient Instructions (Signed)

## 2014-04-08 NOTE — Progress Notes (Addendum)
Reason for visit: Gait disorder  Kimberly Bass is an 80 y.o. female  History of present illness:  Kimberly Bass is an [age] year old left handed white female with a history of a gait disorder, and a history of an essential tremor. The patient has myoclonic-type jerks of the arms and legs, and this seems to respond to some degree to gabapentin and baclofen. The patient is now on 300 mg 3 times daily of the gabapentin, and she indicates that when she was taken four capsules a day she did better. The patient has not had any falls, and she uses a walker for ambulation. The patient indicates that overall she is doing better. The patient was last seen one year ago.  Past Medical History  Diagnosis Date  . Coronary artery disease   . Hyperlipidemia   . Thyroid disease   . Hypertension   . Hx of transient ischemic attack (TIA)   . Hyperthyroidism   . Lumbosacral spondylosis   . Mild obesity   . Benign essential tremor   . Lower extremity myoclonus   . Seizures     History is questionable  . Myoclonus     Lower extremity involvement  . H/O radioactive iodine thyroid ablation   . Obesity   . Gait disorder     Past Surgical History  Procedure Laterality Date  . Cardiac catheterization  01/03/2006  . Hx back surgery    . Tonsillectomy    . Appendectomy    . Abdominal hysterectomy    . Breast lumpectomy    . Myocardial infarction      Family History  Problem Relation Age of Onset  . Heart disease Father   . Heart attack Mother   . Stroke Sister   . Heart attack Sister   . Heart attack Brother     Possible  . Diabetes Sister     Social history:  reports that she has never smoked. She has never used smokeless tobacco. She reports that she does not drink alcohol or use illicit drugs.    Allergies  Allergen Reactions  . Codeine   . Meperidine Hcl   . Morphine   . Penicillins     Medications:  Current Outpatient Prescriptions on File Prior to Visit  Medication  Sig Dispense Refill  . acetaminophen (TYLENOL ARTHRITIS PAIN) 650 MG CR tablet Take 650 mg by mouth every 8 (eight) hours as needed. Taking 2 tid       . amLODipine (NORVASC) 5 MG tablet Take 5 mg by mouth daily.        Marland Kitchen. aspirin 81 MG tablet Take 81 mg by mouth daily.        . baclofen (LIORESAL) 20 MG tablet Take 1 and 1/2 tablets by  mouth in the morning and in the evening and 1 tablet at noon  360 tablet  0  . Calcium Carbonate-Vitamin D (CALCIUM + D PO) Take by mouth 2 (two) times daily.        . clopidogrel (PLAVIX) 75 MG tablet Take 1 tablet by mouth  daily  90 tablet  3  . levothyroxine (SYNTHROID, LEVOTHROID) 75 MCG tablet Take 75 mcg by mouth daily.        . metoprolol (LOPRESSOR) 100 MG tablet Take 1 tablet by mouth  twice a day  180 tablet  0  . Multiple Vitamin (MULTIVITAMIN) tablet Take 1 tablet by mouth daily.        Marland Kitchen. NITROSTAT  0.4 MG SL tablet DISSOLVE ONE TABLET UNDER THE TONGUE EVERY 5 MINUTES AS NEEDED FOR CHEST PAIN.  DO NOT EXCEED A TOTAL OF 3 DOSES IN 15 MINUTES  25 tablet  1  . ranitidine (ZANTAC) 150 MG tablet Take 150 mg by mouth 2 (two) times daily.        . simvastatin (ZOCOR) 20 MG tablet Take 20 mg by mouth at bedtime.        . traZODone (DESYREL) 50 MG tablet Take 50 mg by mouth at bedtime. Takes one and one half tablet at bedtime.      . triamterene-hydrochlorothiazide (MAXZIDE-25) 37.5-25 MG per tablet Take 1 tablet by mouth daily.        . vitamin B-12 (CYANOCOBALAMIN) 1000 MCG tablet Take 1,000 mcg by mouth daily.      . isosorbide mononitrate (IMDUR) 60 MG 24 hr tablet Take 1 tablet (60 mg total) by mouth every morning.  90 tablet  3   No current facility-administered medications on file prior to visit.    ROS:  Out of a complete 14 system review of symptoms, the patient complains only of the following symptoms, and all other reviewed systems are negative.  Walking difficulties  Blood pressure 129/79, pulse 66, height 4\' 11"  (1.499 m), weight 172 lb 12.8  oz (78.382 kg).  Physical Exam  General: The patient is alert and cooperative at the time of the examination.  Skin: No significant peripheral edema is noted.   Neurologic Exam  Mental status: The patient is oriented x 3.  Cranial nerves: Facial symmetry is present. Speech is normal, no aphasia or dysarthria is noted. Extraocular movements are full. Visual fields are full.  Motor: The patient has good strength in all 4 extremities.  Sensory examination: Soft touch sensation is symmetric on the face, arms, and legs.  Coordination: The patient has good finger-nose-finger and heel-to-shin bilaterally.  Gait and station: The patient is able to ambulate with a walker, good stability is noted. Tandem gait was not attempted. The patient has intermittent myoclonic-type jerking of the legs and body. Romberg is negative. No drift is seen.  Reflexes: Deep tendon reflexes are symmetric.   Assessment/Plan:  1. Essential tremor  2. Myoclonic jerks  3. Gait disorder  The patient overall is doing relatively well, but she indicates that she does better on 4 gabapentin daily rather than 3 a day. We will increase the medication to 300 mg 4 times daily of gabapentin. She will followup in about 6 months. The patient is remain safe with her ambulation, she has not had any falls.  Marlan Palau. Keith Willis MD 04/08/2014 7:45 PM  [facility] [address] [place], [place] [address]  Phone [phone] Fax [phone]

## 2014-04-30 ENCOUNTER — Other Ambulatory Visit: Payer: Self-pay | Admitting: Cardiovascular Disease

## 2014-05-03 ENCOUNTER — Other Ambulatory Visit: Payer: Self-pay | Admitting: Cardiovascular Disease

## 2014-05-03 MED ORDER — METOPROLOL TARTRATE 100 MG PO TABS: ORAL_TABLET | ORAL | Status: DC

## 2014-05-15 ENCOUNTER — Encounter: Payer: Self-pay | Admitting: Neurology

## 2014-05-21 ENCOUNTER — Encounter: Payer: Self-pay | Admitting: Neurology

## 2014-07-01 DIAGNOSIS — R32 Unspecified urinary incontinence: Secondary | ICD-10-CM | POA: Diagnosis not present

## 2014-07-01 DIAGNOSIS — M2550 Pain in unspecified joint: Secondary | ICD-10-CM | POA: Diagnosis not present

## 2014-07-23 ENCOUNTER — Other Ambulatory Visit: Payer: Self-pay | Admitting: Neurology

## 2014-07-30 DIAGNOSIS — H04123 Dry eye syndrome of bilateral lacrimal glands: Secondary | ICD-10-CM | POA: Diagnosis not present

## 2014-07-30 DIAGNOSIS — Z961 Presence of intraocular lens: Secondary | ICD-10-CM | POA: Diagnosis not present

## 2014-08-21 ENCOUNTER — Other Ambulatory Visit: Payer: Self-pay | Admitting: Cardiovascular Disease

## 2014-08-23 DIAGNOSIS — I10 Essential (primary) hypertension: Secondary | ICD-10-CM | POA: Diagnosis not present

## 2014-08-23 DIAGNOSIS — I2510 Atherosclerotic heart disease of native coronary artery without angina pectoris: Secondary | ICD-10-CM | POA: Diagnosis not present

## 2014-08-23 DIAGNOSIS — R32 Unspecified urinary incontinence: Secondary | ICD-10-CM | POA: Diagnosis not present

## 2014-10-09 ENCOUNTER — Encounter: Payer: Self-pay | Admitting: Neurology

## 2014-10-09 ENCOUNTER — Ambulatory Visit (INDEPENDENT_AMBULATORY_CARE_PROVIDER_SITE_OTHER): Payer: Medicare Other | Admitting: Neurology

## 2014-10-09 VITALS — BP 122/68 | HR 70 | Ht 59.0 in | Wt 173.2 lb

## 2014-10-09 DIAGNOSIS — G252 Other specified forms of tremor: Secondary | ICD-10-CM

## 2014-10-09 DIAGNOSIS — G250 Essential tremor: Secondary | ICD-10-CM

## 2014-10-09 DIAGNOSIS — R269 Unspecified abnormalities of gait and mobility: Secondary | ICD-10-CM | POA: Diagnosis not present

## 2014-10-09 DIAGNOSIS — R251 Tremor, unspecified: Secondary | ICD-10-CM | POA: Diagnosis not present

## 2014-10-09 MED ORDER — BACLOFEN 20 MG PO TABS: ORAL_TABLET | ORAL | Status: DC

## 2014-10-09 MED ORDER — GABAPENTIN 300 MG PO CAPS: 300.0000 mg | ORAL_CAPSULE | Freq: Four times a day (QID) | ORAL | Status: DC

## 2014-10-09 NOTE — Progress Notes (Signed)
Reason for visit: Gait disorder  Kimberly Bass is an 81 y.o. female  History of present illness:  Kimberly Bass is an 81 year old left-handed white female with a history of an essential tremor, and a gait disorder. The patient has myoclonic jerks of the lower extremities that impair her ability to walk effectively. The patient has a tremor that affects the hands, head and neck, and a vocal tremor. The patient has fallen about 10 days ago, she fell backwards, landed on her buttocks. The patient bruised her buttocks, but did not sustain severe injury. The patient is on baclofen and gabapentin for the myoclonic jerking of the legs which is effective. The patient indicates that she has a sensation of numbness in the legs, and jerking the leg seems to help the sensation. The patient otherwise has not had any new medical issues that have come up since last seen. The patient is left-handed, but she is using her right hand to write with, as she seems to be better doing this.  Past Medical History  Diagnosis Date  . Coronary artery disease   . Hyperlipidemia   . Thyroid disease   . Hypertension   . Hx of transient ischemic attack (TIA)   . Hyperthyroidism   . Lumbosacral spondylosis   . Mild obesity   . Benign essential tremor   . Lower extremity myoclonus   . Seizures     History is questionable  . Myoclonus     Lower extremity involvement  . H/O radioactive iodine thyroid ablation   . Obesity   . Gait disorder     Past Surgical History  Procedure Laterality Date  . Cardiac catheterization  01/03/2006  . Hx back surgery    . Tonsillectomy    . Appendectomy    . Abdominal hysterectomy    . Breast lumpectomy    . Myocardial infarction      Family History  Problem Relation Age of Onset  . Heart disease Father   . Heart attack Mother   . Stroke Sister   . Heart attack Sister   . Heart attack Brother     Possible  . Diabetes Sister     Social history:  reports that  she has never smoked. She has never used smokeless tobacco. She reports that she does not drink alcohol or use illicit drugs.    Allergies  Allergen Reactions  . Codeine   . Meperidine Hcl   . Morphine   . Penicillins     Medications:  Prior to Admission medications   Medication Sig Start Date End Date Taking? Authorizing Provider  acetaminophen (TYLENOL ARTHRITIS PAIN) 650 MG CR tablet Take 650 mg by mouth every 8 (eight) hours as needed. Taking 2 tid    Yes Historical Provider, MD  amLODipine (NORVASC) 5 MG tablet Take 5 mg by mouth daily.     Yes Historical Provider, MD  aspirin 81 MG tablet Take 81 mg by mouth daily.     Yes Historical Provider, MD  baclofen (LIORESAL) 20 MG tablet Take 1 and 1/2 tablets by  mouth in the morning and in the evening and 1 tablet at noon 10/09/14  Yes York Spaniel, MD  Calcium Carbonate-Vitamin D (CALCIUM + D PO) Take by mouth 2 (two) times daily.     Yes Historical Provider, MD  clopidogrel (PLAVIX) 75 MG tablet Take 1 tablet by mouth  daily 05/02/14  Yes Vesta Mixer, MD  gabapentin (NEURONTIN) 300 MG  capsule Take 1 capsule (300 mg total) by mouth 4 (four) times daily. Take 1 capsule by mouth 3  times daily 10/09/14  Yes York Spanielharles K Willis, MD  isosorbide mononitrate (IMDUR) 60 MG 24 hr tablet Take 1 tablet by mouth  every morning 08/21/14  Yes Vesta MixerPhilip J Nahser, MD  levothyroxine (SYNTHROID, LEVOTHROID) 75 MCG tablet Take 75 mcg by mouth daily.     Yes Historical Provider, MD  metoprolol (LOPRESSOR) 100 MG tablet Take 1 tablet by mouth  twice a day 05/03/14  Yes Vesta MixerPhilip J Nahser, MD  mirabegron ER (MYRBETRIQ) 25 MG TB24 tablet Take 25 mg by mouth daily.   Yes Historical Provider, MD  Multiple Vitamin (MULTIVITAMIN) tablet Take 1 tablet by mouth daily.     Yes Historical Provider, MD  NITROSTAT 0.4 MG SL tablet DISSOLVE ONE TABLET UNDER THE TONGUE EVERY 5 MINUTES AS NEEDED FOR CHEST PAIN.  DO NOT EXCEED A TOTAL OF 3 DOSES IN 15 MINUTES 10/11/12  Yes Vesta MixerPhilip J  Nahser, MD  ranitidine (ZANTAC) 150 MG tablet Take 150 mg by mouth 2 (two) times daily.     Yes Historical Provider, MD  simvastatin (ZOCOR) 20 MG tablet Take 20 mg by mouth at bedtime.     Yes Historical Provider, MD  traZODone (DESYREL) 50 MG tablet Take 50 mg by mouth at bedtime. Takes one and one half tablet at bedtime.   Yes Historical Provider, MD  triamterene-hydrochlorothiazide (MAXZIDE-25) 37.5-25 MG per tablet Take 1 tablet by mouth daily.     Yes Historical Provider, MD  vitamin B-12 (CYANOCOBALAMIN) 1000 MCG tablet Take 1,000 mcg by mouth daily.   Yes Historical Provider, MD    ROS:  Out of a complete 14 system review of symptoms, the patient complains only of the following symptoms, and all other reviewed systems are negative.  Gait disorder Tremor  Blood pressure 122/68, pulse 70, height 4\' 11"  (1.499 m), weight 173 lb 3.2 oz (78.563 kg).  Physical Exam  General: The patient is alert and cooperative at the time of the examination. The patient is moderately obese.  Skin: No significant peripheral edema is noted.   Neurologic Exam  Mental status: The patient is alert and oriented x 3 at the time of the examination. The patient has apparent normal recent and remote memory, with an apparently normal attention span and concentration ability.   Cranial nerves: Facial symmetry is present. Speech is associated with a vocal tremor. Extraocular movements are full. Visual fields are full. There is a side-to-side head tremor.  Motor: The patient has good strength in all 4 extremities.  Sensory examination: Soft touch sensation is symmetric on the face, arms, and legs.  Coordination: The patient has good finger-nose-finger and heel-to-shin bilaterally.  Gait and station: The patient has a slightly wide-based gait. The patient uses a walker for an ambulation. With standing, the patient has an orthostatic type tremor, bouncing. Tandem gait was not attempted. Romberg is  unsteady.  Reflexes: Deep tendon reflexes are symmetric, but are depressed.   Assessment/Plan:  1. Gait disorder  2. Essential tremor  3. Lower extremity myoclonic jerks  The patient may have a form of restless leg syndrome, but she does not wish to try medications such as Mirapex or Requip. We will stay on the baclofen and gabapentin, the prescriptions were called in. The patient is using her walker, she is trying to be safe with ambulation. The tremor is affecting her handwriting, but does not prevent her from performing activities  of daily living. The patient will follow-up in 6 months.  Marlan Palau MD 10/09/2014 9:31 PM  [facility] [address] [place], [place] [address]  Phone [phone] Fax [phone]

## 2014-10-09 NOTE — Patient Instructions (Signed)

## 2014-11-12 ENCOUNTER — Other Ambulatory Visit: Payer: Self-pay

## 2014-11-12 DIAGNOSIS — Z1231 Encounter for screening mammogram for malignant neoplasm of breast: Secondary | ICD-10-CM

## 2014-12-20 ENCOUNTER — Ambulatory Visit: Payer: Self-pay

## 2014-12-24 ENCOUNTER — Other Ambulatory Visit: Payer: Self-pay | Admitting: Cardiovascular Disease

## 2015-01-20 ENCOUNTER — Ambulatory Visit (INDEPENDENT_AMBULATORY_CARE_PROVIDER_SITE_OTHER): Payer: Medicare Other | Admitting: Cardiovascular Disease

## 2015-01-20 ENCOUNTER — Encounter: Payer: Self-pay | Admitting: Cardiovascular Disease

## 2015-01-20 VITALS — BP 124/80 | HR 68 | Ht 59.0 in | Wt 176.5 lb

## 2015-01-20 DIAGNOSIS — I2510 Atherosclerotic heart disease of native coronary artery without angina pectoris: Secondary | ICD-10-CM | POA: Diagnosis not present

## 2015-01-20 NOTE — Progress Notes (Signed)
Kimberly Bass Date of Birth  1926-03-09       [facility]    [place] [address]  [address] [place], [place]  [address]   [place], [place]  [address] [phone]     [phone]   Fax  [phone]    Fax [phone]  Problem List: 1. Coronary artery disease-is a tight stenosis in the distal aspect of her circumflex artery 2. Hyperthyroidism 3. Hyperlipidemia 4. Hypertension  History of Present Illness: Kimberly Bass is an 81 yo with a hx of CAD. She had a heart catheterization on January 03, 2006. She has a very tight stenosis in the terminal aspect of a large posterior lateral branch. This stenosis is very tortuous and is diffusely diseased and is not a candidate for PCI.  She's not had any episodes of chest pain or shortness breath. She's been able to do all of her normal activities without any limitations. She used  to have a good bit of angina but has not noticed any in the past several months.  December 27, 2012:  Kimberly Bass is doing OK.  She is getting weaker - came in with a walker today.  She is able to walk shorter distances on her own.  She has leg weakness walking long distances.    She has not had to use her NTG much - on occasion.     01/10/2014: Pt is doing ok.  Still using the walker for long distance walking.  Able to get around the house without.  No CP.   BP is OK  January 20, 2015:  Doing well. No CP .    Current Outpatient Prescriptions on File Prior to Visit  Medication Sig Dispense Refill  . acetaminophen (TYLENOL ARTHRITIS PAIN) 650 MG CR tablet Take 650 mg by mouth every 8 (eight) hours as needed. Taking 2 tid     . amLODipine (NORVASC) 5 MG tablet Take 5 mg by mouth daily.      Marland Kitchen aspirin 81 MG tablet Take 81 mg by mouth daily.      . baclofen (LIORESAL) 20 MG tablet Take 1 and 1/2 tablets by  mouth in the morning and in the evening and 1 tablet at noon 360 tablet 1  . Calcium Carbonate-Vitamin D  (CALCIUM + D PO) Take by mouth 2 (two) times daily.      . clopidogrel (PLAVIX) 75 MG tablet Take 1 tablet by mouth  daily 90 tablet 3  . gabapentin (NEURONTIN) 300 MG capsule Take 1 capsule (300 mg total) by mouth 4 (four) times daily. Take 1 capsule by mouth 3  times daily 360 capsule 1  . isosorbide mononitrate (IMDUR) 60 MG 24 hr tablet Take 1 tablet by mouth  every morning 90 tablet 1  . levothyroxine (SYNTHROID, LEVOTHROID) 75 MCG tablet Take 75 mcg by mouth daily.      . metoprolol (LOPRESSOR) 100 MG tablet Take 1 tablet by mouth  twice a day 180 tablet 1  . mirabegron ER (MYRBETRIQ) 25 MG TB24 tablet Take 25 mg by mouth daily.    . Multiple Vitamin (MULTIVITAMIN) tablet Take 1 tablet by mouth daily.      Marland Kitchen NITROSTAT 0.4 MG SL tablet DISSOLVE ONE TABLET UNDER THE TONGUE EVERY 5 MINUTES AS NEEDED FOR CHEST PAIN.  DO NOT EXCEED A TOTAL OF 3 DOSES IN 15 MINUTES 25 tablet 1  . ranitidine (ZANTAC) 150 MG tablet Take 150 mg  by mouth 2 (two) times daily.      . simvastatin (ZOCOR) 20 MG tablet Take 20 mg by mouth at bedtime.      . traZODone (DESYREL) 50 MG tablet Take 50 mg by mouth at bedtime. Takes one and one half tablet at bedtime.    . triamterene-hydrochlorothiazide (MAXZIDE-25) 37.5-25 MG per tablet Take 1 tablet by mouth daily.      . vitamin B-12 (CYANOCOBALAMIN) 1000 MCG tablet Take 1,000 mcg by mouth daily.     No current facility-administered medications on file prior to visit.    Allergies  Allergen Reactions  . Codeine Nausea Only  . Meperidine Hcl Nausea Only  . Morphine Nausea Only  . Penicillins Nausea Only    Past Medical History  Diagnosis Date  . Coronary artery disease   . Hyperlipidemia   . Thyroid disease   . Hypertension   . Hx of transient ischemic attack (TIA)   . Hyperthyroidism   . Lumbosacral spondylosis   . Mild obesity   . Benign essential tremor   . Lower extremity myoclonus   . Seizures     History is questionable  . Myoclonus     Lower  extremity involvement  . H/O radioactive iodine thyroid ablation   . Obesity   . Gait disorder     Past Surgical History  Procedure Laterality Date  . Cardiac catheterization  01/03/2006  . Hx back surgery    . Tonsillectomy    . Appendectomy    . Abdominal hysterectomy    . Breast lumpectomy    . Myocardial infarction      History  Smoking status  . Never Smoker   Smokeless tobacco  . Never Used    History  Alcohol Use No    Family History  Problem Relation Age of Onset  . Heart disease Father   . Heart attack Mother   . Stroke Sister   . Heart attack Sister   . Heart attack Brother     Possible  . Diabetes Sister     Reviw of Systems:  Reviewed in the HPI.  All other systems are negative.  Physical Exam: Blood pressure 124/80, pulse 68, height  (1.499 m), weight 80.06 kg (176 lb 8 oz). General: Well developed, well nourished, in no acute distress.  She has a benign tremor.  Head: Normocephalic, atraumatic, sclera non-icteric, mucus membranes are moist,   Neck: Supple. Carotids are 2 + without bruits. No JVD  Lungs: Clear bilaterally to auscultation.  Heart: regular rate.  normal  S1 S2. No murmurs, gallops or rubs.  Abdomen: Soft, non-tender, non-distended with normal bowel sounds. No hepatomegaly. No rebound/guarding. No masses.  Msk:  Strength and tone are normal  Extremities: No clubbing or cyanosis. No edema.  Distal pedal pulses are 2+ and equal bilaterally.  Neuro: Alert and oriented X 3. Moves all extremities spontaneously.  Psych:  Responds to questions appropriately with a normal affect.  ECG: January 20, 2015:  NSR at 27.  Sinus arrhythmia. LAD   Assessment / Plan:   1. Coronary artery disease-is a tight stenosis in the distal aspect of her circumflex artery   Doing well. No angina,. Does have some DOE  Will see her in 1 year.    2. Hyperthyroidism  3. Hyperlipidemia  4. Hypertension - BP is well controlled.       Kimberly Bass, Kimberly Ping, MD  01/20/2015 12:04 PM    [facility] [address]  [address] [place], [place]  [address] Pager [phone] Phone: [phone]; Fax: [phone]   [facility]  [address] [place], [place]  [address] [phone]   Fax [phone]

## 2015-01-20 NOTE — Patient Instructions (Signed)
Medication Instructions:  Your physician recommends that you continue on your current medications as directed. Please refer to the Current Medication list given to you today.   Labwork: None Ordered   Testing/Procedures: None Ordered   Follow-Up: Your physician wants you to follow-up in: 1 year with Dr. Nahser.  You will receive a reminder letter in the mail two months in advance. If you don't receive a letter, please call our office to schedule the follow-up appointment.      

## 2015-02-18 ENCOUNTER — Other Ambulatory Visit: Payer: Self-pay | Admitting: Cardiovascular Disease

## 2015-02-28 DIAGNOSIS — E890 Postprocedural hypothyroidism: Secondary | ICD-10-CM | POA: Diagnosis not present

## 2015-04-01 DIAGNOSIS — E890 Postprocedural hypothyroidism: Secondary | ICD-10-CM | POA: Diagnosis not present

## 2015-04-01 DIAGNOSIS — I10 Essential (primary) hypertension: Secondary | ICD-10-CM | POA: Diagnosis not present

## 2015-04-01 DIAGNOSIS — N390 Urinary tract infection, site not specified: Secondary | ICD-10-CM | POA: Diagnosis not present

## 2015-04-01 DIAGNOSIS — Z0000 Encounter for general adult medical examination without abnormal findings: Secondary | ICD-10-CM | POA: Diagnosis not present

## 2015-04-08 ENCOUNTER — Other Ambulatory Visit (HOSPITAL_COMMUNITY): Payer: Self-pay | Admitting: Respiratory Therapy

## 2015-04-08 DIAGNOSIS — R0602 Shortness of breath: Secondary | ICD-10-CM | POA: Diagnosis not present

## 2015-04-08 DIAGNOSIS — R0600 Dyspnea, unspecified: Secondary | ICD-10-CM

## 2015-04-08 DIAGNOSIS — R0609 Other forms of dyspnea: Secondary | ICD-10-CM | POA: Diagnosis not present

## 2015-04-08 DIAGNOSIS — E890 Postprocedural hypothyroidism: Secondary | ICD-10-CM | POA: Diagnosis not present

## 2015-04-08 DIAGNOSIS — E041 Nontoxic single thyroid nodule: Secondary | ICD-10-CM | POA: Diagnosis not present

## 2015-04-08 DIAGNOSIS — N200 Calculus of kidney: Secondary | ICD-10-CM | POA: Diagnosis not present

## 2015-04-08 DIAGNOSIS — I10 Essential (primary) hypertension: Secondary | ICD-10-CM | POA: Diagnosis not present

## 2015-04-08 DIAGNOSIS — M810 Age-related osteoporosis without current pathological fracture: Secondary | ICD-10-CM | POA: Diagnosis not present

## 2015-04-08 DIAGNOSIS — I2510 Atherosclerotic heart disease of native coronary artery without angina pectoris: Secondary | ICD-10-CM | POA: Diagnosis not present

## 2015-04-11 ENCOUNTER — Ambulatory Visit (HOSPITAL_COMMUNITY)
Admission: RE | Admit: 2015-04-11 | Discharge: 2015-04-11 | Disposition: A | Discharge status: Home / Self Care | Payer: Medicare Other | Source: Ambulatory Visit | Attending: Internal Medicine | Admitting: Internal Medicine

## 2015-04-11 DIAGNOSIS — R0600 Dyspnea, unspecified: Secondary | ICD-10-CM | POA: Diagnosis not present

## 2015-04-11 LAB — SPIROMETRY WITH GRAPH
FEF 25-75 Pre: 0.48 L/sec
FEF2575-%Pred-Pre: 63 %
FEV1-%PRED-PRE: 47 %
FEV1-Pre: 0.58 L
FEV1FVC-%Pred-Pre: 106 %
FEV6-%PRED-PRE: 47 %
FEV6-Pre: 0.74 L
FEV6FVC-%PRED-PRE: 108 %
FVC-%PRED-PRE: 44 %
FVC-Pre: 0.76 L
PRE FEV1/FVC RATIO: 77 %
Pre FEV6/FVC Ratio: 100 %

## 2015-04-16 ENCOUNTER — Ambulatory Visit (INDEPENDENT_AMBULATORY_CARE_PROVIDER_SITE_OTHER): Payer: Medicare Other | Admitting: Neurology

## 2015-04-16 ENCOUNTER — Encounter: Payer: Self-pay | Admitting: Neurology

## 2015-04-16 VITALS — BP 118/68 | HR 70 | Ht 59.0 in | Wt 175.0 lb

## 2015-04-16 DIAGNOSIS — G253 Myoclonus: Secondary | ICD-10-CM | POA: Diagnosis not present

## 2015-04-16 DIAGNOSIS — R269 Unspecified abnormalities of gait and mobility: Secondary | ICD-10-CM

## 2015-04-16 MED ORDER — BACLOFEN 20 MG PO TABS: ORAL_TABLET | ORAL | Status: DC

## 2015-04-16 MED ORDER — GABAPENTIN 300 MG PO CAPS: 300.0000 mg | ORAL_CAPSULE | Freq: Four times a day (QID) | ORAL | Status: DC

## 2015-04-16 NOTE — Patient Instructions (Signed)
Fall Prevention in the Home  Falls can cause injuries and can affect people from all age groups. There are many simple things that you can do to make your home safe and to help prevent falls. WHAT CAN I DO ON THE OUTSIDE OF MY HOME?  Regularly repair the edges of walkways and driveways and fix any cracks.  Remove high doorway thresholds.  Trim any shrubbery on the main path into your home.  Use bright outdoor lighting.  Clear walkways of debris and clutter, including tools and rocks.  Regularly check that handrails are securely fastened and in good repair. Both sides of any steps should have handrails.  Install guardrails along the edges of any raised decks or porches.  Have leaves, snow, and ice cleared regularly.  Use sand or salt on walkways during winter months.  In the garage, clean up any spills right away, including grease or oil spills. WHAT CAN I DO IN THE BATHROOM?  Use night lights.  Install grab bars by the toilet and in the tub and shower. Do not use towel bars as grab bars.  Use non-skid mats or decals on the floor of the tub or shower.  If you need to sit down while you are in the shower, use a plastic, non-slip stool..  Keep the floor dry. Immediately clean up any water that spills on the floor.  Remove soap buildup in the tub or shower on a regular basis.  Attach bath mats securely with double-sided non-slip rug tape.  Remove throw rugs and other tripping hazards from the floor. WHAT CAN I DO IN THE BEDROOM?  Use night lights.  Make sure that a bedside light is easy to reach.  Do not use oversized bedding that drapes onto the floor.  Have a firm chair that has side arms to use for getting dressed.  Remove throw rugs and other tripping hazards from the floor. WHAT CAN I DO IN THE KITCHEN?   Clean up any spills right away.  Avoid walking on wet floors.  Place frequently used items in easy-to-reach places.  If you need to reach for something  above you, use a sturdy step stool that has a grab bar.  Keep electrical cables out of the way.  Do not use floor polish or wax that makes floors slippery. If you have to use wax, make sure that it is non-skid floor wax.  Remove throw rugs and other tripping hazards from the floor. WHAT CAN I DO IN THE STAIRWAYS?  Do not leave any items on the stairs.  Make sure that there are handrails on both sides of the stairs. Fix handrails that are broken or loose. Make sure that handrails are as long as the stairways.  Check any carpeting to make sure that it is firmly attached to the stairs. Fix any carpet that is loose or worn.  Avoid having throw rugs at the top or bottom of stairways, or secure the rugs with carpet tape to prevent them from moving.  Make sure that you have a light switch at the top of the stairs and the bottom of the stairs. If you do not have them, have them installed. WHAT ARE SOME OTHER FALL PREVENTION TIPS?  Wear closed-toe shoes that fit well and support your feet. Wear shoes that have rubber soles or low heels.  When you use a stepladder, make sure that it is completely opened and that the sides are firmly locked. Have someone hold the ladder while you   are using it. Do not climb a closed stepladder.  Add color or contrast paint or tape to grab bars and handrails in your home. Place contrasting color strips on the first and last steps.  Use mobility aids as needed, such as canes, walkers, scooters, and crutches.  Turn on lights if it is dark. Replace any light bulbs that burn out.  Set up furniture so that there are clear paths. Keep the furniture in the same spot.  Fix any uneven floor surfaces.  Choose a carpet design that does not hide the edge of steps of a stairway.  Be aware of any and all pets.  Review your medicines with your healthcare provider. Some medicines can cause dizziness or changes in blood pressure, which increase your risk of falling. Talk  with your health care provider about other ways that you can decrease your risk of falls. This may include working with a physical therapist or trainer to improve your strength, balance, and endurance.   This information is not intended to replace advice given to you by your health care provider. Make sure you discuss any questions you have with your health care provider.   Document Released: 06/04/2002 Document Revised: 10/29/2014 Document Reviewed: 07/19/2014 Elsevier Interactive Patient Education 2016 [employer]  

## 2015-04-16 NOTE — Progress Notes (Signed)
Reason for visit: Tremor, myoclonus  Kimberly Bass is an 81 y.o. female  History of present illness:  Kimberly Bass is an 81 year old right-handed white female with a history of an essential tremor affecting the voice and the head and neck. The patient also has episodes of myoclonic type movements that affect the axial muscles and legs. The patient has not had any falls, she generally does better with the gabapentin and baclofen combination with the myoclonic type movements. The patient is sleeping fairly well. If she is under stress or excited about something, the myoclonic movements are more prominent. The patient lives at home with her husband, she has one step to get into the house, and she has remained safe. She does not do a lot of exercise or walking. She comes to this office for an evaluation. No other new medical issues have come up since last seen.  Past Medical History  Diagnosis Date  . Coronary artery disease   . Hyperlipidemia   . Thyroid disease   . Hypertension   . Hx of transient ischemic attack (TIA)   . Hyperthyroidism   . Lumbosacral spondylosis   . Mild obesity   . Benign essential tremor   . Lower extremity myoclonus   . Seizures (HCC)     History is questionable  . Myoclonus     Lower extremity involvement  . H/O radioactive iodine thyroid ablation   . Obesity   . Gait disorder     Past Surgical History  Procedure Laterality Date  . Cardiac catheterization  01/03/2006  . Hx back surgery    . Tonsillectomy    . Appendectomy    . Abdominal hysterectomy    . Breast lumpectomy    . Myocardial infarction      Family History  Problem Relation Age of Onset  . Heart disease Father   . Heart attack Mother   . Stroke Sister   . Heart attack Sister   . Heart attack Brother     Possible  . Diabetes Sister     Social history:  reports that she has never smoked. She has never used smokeless tobacco. She reports that she does not drink alcohol or  use illicit drugs.    Allergies  Allergen Reactions  . Codeine Nausea Only  . Meperidine Hcl Nausea Only  . Morphine Nausea Only  . Penicillins Nausea Only    Medications:  Prior to Admission medications   Medication Sig Start Date End Date Taking? Authorizing Provider  acetaminophen (TYLENOL ARTHRITIS PAIN) 650 MG CR tablet Take 650 mg by mouth every 8 (eight) hours as needed. Taking 2 tid    Yes Historical Provider, MD  amLODipine (NORVASC) 5 MG tablet Take 5 mg by mouth daily.     Yes Historical Provider, MD  aspirin 81 MG tablet Take 81 mg by mouth daily.     Yes Historical Provider, MD  baclofen (LIORESAL) 20 MG tablet Take 1 and 1/2 tablets by  mouth in the morning and in the evening and 1 tablet at noon 10/09/14  Yes York Spanielharles K Willis, MD  Calcium Carbonate-Vitamin D (CALCIUM + D PO) Take by mouth 2 (two) times daily.     Yes Historical Provider, MD  clopidogrel (PLAVIX) 75 MG tablet Take 1 tablet by mouth  daily 05/02/14  Yes Vesta MixerPhilip J Nahser, MD  gabapentin (NEURONTIN) 300 MG capsule Take 1 capsule (300 mg total) by mouth 4 (four) times daily. Take 1 capsule by  mouth 3  times daily 10/09/14  Yes York Spaniel, MD  isosorbide mononitrate (IMDUR) 60 MG 24 hr tablet Take 1 tablet by mouth  every morning 02/19/15  Yes Vesta Mixer, MD  levothyroxine (SYNTHROID, LEVOTHROID) 75 MCG tablet Take 75 mcg by mouth daily.     Yes Historical Provider, MD  metoprolol (LOPRESSOR) 100 MG tablet Take 1 tablet by mouth  twice a day 12/25/14  Yes Vesta Mixer, MD  mirabegron ER (MYRBETRIQ) 25 MG TB24 tablet Take 25 mg by mouth daily.   Yes Historical Provider, MD  Multiple Vitamin (MULTIVITAMIN) tablet Take 1 tablet by mouth daily.     Yes Historical Provider, MD  NITROSTAT 0.4 MG SL tablet DISSOLVE ONE TABLET UNDER THE TONGUE EVERY 5 MINUTES AS NEEDED FOR CHEST PAIN.  DO NOT EXCEED A TOTAL OF 3 DOSES IN 15 MINUTES 10/11/12  Yes Vesta Mixer, MD  ranitidine (ZANTAC) 150 MG tablet Take 150 mg by  mouth 2 (two) times daily.     Yes Historical Provider, MD  simvastatin (ZOCOR) 20 MG tablet Take 20 mg by mouth at bedtime.     Yes Historical Provider, MD  traZODone (DESYREL) 50 MG tablet Take 50 mg by mouth at bedtime. Takes one and one half tablet at bedtime.   Yes Historical Provider, MD  triamterene-hydrochlorothiazide (MAXZIDE-25) 37.5-25 MG per tablet Take 1 tablet by mouth daily.     Yes Historical Provider, MD  vitamin B-12 (CYANOCOBALAMIN) 1000 MCG tablet Take 1,000 mcg by mouth daily.   Yes Historical Provider, MD    ROS:  Out of a complete 14 system review of symptoms, the patient complains only of the following symptoms, and all other reviewed systems are negative.  Tremors Gait instability  Blood pressure 118/68, pulse 70, height  (1.499 m), weight 175 lb (79.379 kg).  Physical Exam  General: The patient is alert and cooperative at the time of the examination. The patient is moderately obese.  Skin: No significant peripheral edema is noted.   Neurologic Exam  Mental status: The patient is alert and oriented x 3 at the time of the examination. The patient has apparent normal recent and remote memory, with an apparently normal attention span and concentration ability.   Cranial nerves: Facial symmetry is present. Speech is normal, no aphasia or dysarthria is noted. A vocal tremor is noted. Extraocular movements are full. Visual fields are full. The patient has a side-to-side head and neck tremor.  Motor: The patient has good strength in all 4 extremities.  Sensory examination: Soft touch sensation is symmetric on the face, arms, and legs.  Coordination: The patient has good finger-nose-finger and heel-to-shin bilaterally. Episodic sudden flexion of the axial muscles and extension of the legs is noted.  Gait and station: The patient has a slightly wide-based, unsteady gait. Tandem gait was not attempted. The patient normally uses a walker for ambulation. Romberg  is negative, but is unsteady. No drift is seen.  Reflexes: Deep tendon reflexes are symmetric.   Assessment/Plan:  1. Essential tremor  2. Myoclonus  3. Gait instability  The patient will remain on gabapentin and baclofen. The prescriptions were called in for these medications. The patient will follow-up through this office in 6-8 months, sooner if needed. She seems to be relatively stable, and she is being relatively safe without falls.  Marlan Palau MD 04/16/2015 11:33 AM  [facility] [address] [place], [place] [address]  Phone [phone] Fax  [phone]  

## 2015-04-18 ENCOUNTER — Other Ambulatory Visit: Payer: Self-pay

## 2015-04-18 MED ORDER — GABAPENTIN 300 MG PO CAPS: 300.0000 mg | ORAL_CAPSULE | Freq: Four times a day (QID) | ORAL | Status: DC

## 2015-05-06 DIAGNOSIS — I10 Essential (primary) hypertension: Secondary | ICD-10-CM | POA: Diagnosis not present

## 2015-05-06 DIAGNOSIS — R0600 Dyspnea, unspecified: Secondary | ICD-10-CM | POA: Diagnosis not present

## 2015-05-06 DIAGNOSIS — I2510 Atherosclerotic heart disease of native coronary artery without angina pectoris: Secondary | ICD-10-CM | POA: Diagnosis not present

## 2015-05-06 DIAGNOSIS — J449 Chronic obstructive pulmonary disease, unspecified: Secondary | ICD-10-CM | POA: Diagnosis not present

## 2015-05-07 DIAGNOSIS — N393 Stress incontinence (female) (male): Secondary | ICD-10-CM | POA: Diagnosis not present

## 2015-05-07 DIAGNOSIS — J4520 Mild intermittent asthma, uncomplicated: Secondary | ICD-10-CM | POA: Diagnosis not present

## 2015-05-15 ENCOUNTER — Other Ambulatory Visit (HOSPITAL_COMMUNITY): Payer: Self-pay | Admitting: Pulmonary Disease

## 2015-05-15 DIAGNOSIS — J984 Other disorders of lung: Secondary | ICD-10-CM

## 2015-05-21 ENCOUNTER — Ambulatory Visit (HOSPITAL_COMMUNITY)
Admission: RE | Admit: 2015-05-21 | Discharge: 2015-05-21 | Disposition: A | Discharge status: Home / Self Care | Payer: Medicare Other | Source: Ambulatory Visit | Attending: Pulmonary Disease | Admitting: Pulmonary Disease

## 2015-05-21 DIAGNOSIS — J9811 Atelectasis: Secondary | ICD-10-CM | POA: Diagnosis not present

## 2015-05-21 DIAGNOSIS — R0602 Shortness of breath: Secondary | ICD-10-CM | POA: Diagnosis not present

## 2015-05-21 DIAGNOSIS — I252 Old myocardial infarction: Secondary | ICD-10-CM | POA: Insufficient documentation

## 2015-05-21 DIAGNOSIS — I517 Cardiomegaly: Secondary | ICD-10-CM | POA: Diagnosis not present

## 2015-05-21 DIAGNOSIS — J984 Other disorders of lung: Secondary | ICD-10-CM

## 2015-05-21 DIAGNOSIS — R918 Other nonspecific abnormal finding of lung field: Secondary | ICD-10-CM | POA: Diagnosis not present

## 2015-05-21 LAB — POCT I-STAT CREATININE: CREATININE: 0.7 mg/dL (ref 0.44–1.00)

## 2015-05-21 MED ORDER — IOHEXOL 300 MG/ML  SOLN
75.0000 mL | Freq: Once | INTRAMUSCULAR | Status: AC | PRN
  Administered 2015-05-21: 75 mL via INTRAVENOUS

## 2015-06-04 ENCOUNTER — Other Ambulatory Visit: Payer: Self-pay | Admitting: Cardiovascular Disease

## 2015-06-25 ENCOUNTER — Other Ambulatory Visit: Payer: Self-pay | Admitting: Cardiovascular Disease

## 2015-07-24 ENCOUNTER — Encounter (HOSPITAL_COMMUNITY): Payer: Self-pay | Admitting: Emergency Medicine

## 2015-07-24 ENCOUNTER — Emergency Department (HOSPITAL_COMMUNITY): Payer: Medicare Other

## 2015-07-24 ENCOUNTER — Emergency Department (HOSPITAL_COMMUNITY)
Admission: EM | Admit: 2015-07-24 | Discharge: 2015-07-25 | Disposition: A | Discharge status: Home / Self Care | Payer: Medicare Other | Attending: Emergency Medicine | Admitting: Emergency Medicine

## 2015-07-24 DIAGNOSIS — Z880 Allergy status to penicillin: Secondary | ICD-10-CM | POA: Insufficient documentation

## 2015-07-24 DIAGNOSIS — Z8669 Personal history of other diseases of the nervous system and sense organs: Secondary | ICD-10-CM | POA: Insufficient documentation

## 2015-07-24 DIAGNOSIS — Y92009 Unspecified place in unspecified non-institutional (private) residence as the place of occurrence of the external cause: Secondary | ICD-10-CM | POA: Insufficient documentation

## 2015-07-24 DIAGNOSIS — E785 Hyperlipidemia, unspecified: Secondary | ICD-10-CM | POA: Insufficient documentation

## 2015-07-24 DIAGNOSIS — Z90710 Acquired absence of both cervix and uterus: Secondary | ICD-10-CM | POA: Insufficient documentation

## 2015-07-24 DIAGNOSIS — Z79899 Other long term (current) drug therapy: Secondary | ICD-10-CM | POA: Diagnosis not present

## 2015-07-24 DIAGNOSIS — I10 Essential (primary) hypertension: Secondary | ICD-10-CM | POA: Insufficient documentation

## 2015-07-24 DIAGNOSIS — Z043 Encounter for examination and observation following other accident: Secondary | ICD-10-CM | POA: Insufficient documentation

## 2015-07-24 DIAGNOSIS — W1839XA Other fall on same level, initial encounter: Secondary | ICD-10-CM | POA: Diagnosis not present

## 2015-07-24 DIAGNOSIS — N390 Urinary tract infection, site not specified: Secondary | ICD-10-CM | POA: Insufficient documentation

## 2015-07-24 DIAGNOSIS — Y998 Other external cause status: Secondary | ICD-10-CM | POA: Diagnosis not present

## 2015-07-24 DIAGNOSIS — I2510 Atherosclerotic heart disease of native coronary artery without angina pectoris: Secondary | ICD-10-CM | POA: Insufficient documentation

## 2015-07-24 DIAGNOSIS — Z9889 Other specified postprocedural states: Secondary | ICD-10-CM | POA: Insufficient documentation

## 2015-07-24 DIAGNOSIS — Z7902 Long term (current) use of antithrombotics/antiplatelets: Secondary | ICD-10-CM | POA: Insufficient documentation

## 2015-07-24 DIAGNOSIS — Z8739 Personal history of other diseases of the musculoskeletal system and connective tissue: Secondary | ICD-10-CM | POA: Insufficient documentation

## 2015-07-24 DIAGNOSIS — E0590 Thyrotoxicosis, unspecified without thyrotoxic crisis or storm: Secondary | ICD-10-CM | POA: Diagnosis not present

## 2015-07-24 DIAGNOSIS — Z8673 Personal history of transient ischemic attack (TIA), and cerebral infarction without residual deficits: Secondary | ICD-10-CM | POA: Insufficient documentation

## 2015-07-24 DIAGNOSIS — Z7982 Long term (current) use of aspirin: Secondary | ICD-10-CM | POA: Diagnosis not present

## 2015-07-24 DIAGNOSIS — R400 Somnolence: Secondary | ICD-10-CM | POA: Diagnosis not present

## 2015-07-24 DIAGNOSIS — R079 Chest pain, unspecified: Secondary | ICD-10-CM | POA: Diagnosis not present

## 2015-07-24 DIAGNOSIS — E669 Obesity, unspecified: Secondary | ICD-10-CM | POA: Insufficient documentation

## 2015-07-24 DIAGNOSIS — Y9389 Activity, other specified: Secondary | ICD-10-CM | POA: Diagnosis not present

## 2015-07-24 DIAGNOSIS — S299XXA Unspecified injury of thorax, initial encounter: Secondary | ICD-10-CM | POA: Diagnosis not present

## 2015-07-24 DIAGNOSIS — S0990XA Unspecified injury of head, initial encounter: Secondary | ICD-10-CM | POA: Diagnosis not present

## 2015-07-24 DIAGNOSIS — R4182 Altered mental status, unspecified: Secondary | ICD-10-CM | POA: Diagnosis present

## 2015-07-24 LAB — I-STAT TROPONIN, ED: Troponin i, poc: 0.01 ng/mL (ref 0.00–0.08)

## 2015-07-24 LAB — CBG MONITORING, ED: GLUCOSE-CAPILLARY: 93 mg/dL (ref 65–99)

## 2015-07-24 MED ORDER — SODIUM CHLORIDE 0.9 % IV BOLUS (SEPSIS)
1000.0000 mL | Freq: Once | INTRAVENOUS | Status: AC
  Administered 2015-07-25: 1000 mL via INTRAVENOUS

## 2015-07-24 NOTE — ED Notes (Signed)
This RN attempted an IV x 2, and Charlotte, RN attempted x 2 without success. Will notify IV team.

## 2015-07-24 NOTE — ED Notes (Signed)
IV team RN at the bedside.  

## 2015-07-24 NOTE — ED Provider Notes (Signed)
CSN: [id number]     Arrival date & time 07/24/15  2206 History   First MD Initiated Contact with Patient 07/24/15 2218     Chief Complaint  Patient presents with  . Fall  . Altered Mental Status     (Consider location/radiation/quality/duration/timing/severity/associated sxs/prior Treatment) HPI Comments: Patient is an 82 year old female with a history of coronary artery disease, hyperlipidemia, thyroid disease, TIA presenting in the EMS for fall and questionable altered mental status. Per family patient's husband died approximately one week ago and she is now living by herself. Patient was wearing a fall alert bracelet and today it indicated that she had a fall. When EMS arrived she denied having a fall and she was sitting on the side of the bed. Family states that she is just seems slightly confused and didn't seem her normal self. They're not sure if she is eating and today but does not have a history of diabetes. When asking the patient if there is anything wrong she says no but continuously falls asleep on exam. Based on EMS. The patient had taken her medications today. She has had no recent medication changes and other than trazodone is not on any other sedating medications.  Pt denies CP, SOB, abd pain, n/v/d or urinary sx  Patient is a 82 y.o. female presenting with fall and altered mental status. The history is provided by the patient and a relative.  Fall  Altered Mental Status   Past Medical History  Diagnosis Date  . Coronary artery disease   . Hyperlipidemia   . Thyroid disease   . Hypertension   . Hx of transient ischemic attack (TIA)   . Hyperthyroidism   . Lumbosacral spondylosis   . Mild obesity   . Benign essential tremor   . Lower extremity myoclonus   . Seizures (HCC)     History is questionable  . Myoclonus     Lower extremity involvement  . H/O radioactive iodine thyroid ablation   . Obesity   . Gait disorder    Past Surgical History  Procedure Laterality  Date  . Cardiac catheterization  01/03/2006  . Hx back surgery    . Tonsillectomy    . Appendectomy    . Abdominal hysterectomy    . Breast lumpectomy    . Myocardial infarction     Family History  Problem Relation Age of Onset  . Heart disease Father   . Heart attack Mother   . Stroke Sister   . Heart attack Sister   . Heart attack Brother     Possible  . Diabetes Sister    Social History  Substance Use Topics  . Smoking status: Never Smoker   . Smokeless tobacco: Never Used  . Alcohol Use: No   OB History    No data available     Review of Systems  All other systems reviewed and are negative.     Allergies  Codeine; Meperidine hcl; Morphine; and Penicillins  Home Medications   Prior to Admission medications   Medication Sig Start Date End Date Taking? Authorizing Provider  acetaminophen (TYLENOL ARTHRITIS PAIN) 650 MG CR tablet Take 650 mg by mouth every 8 (eight) hours as needed. Taking 2 tid     Historical Provider, MD  amLODipine (NORVASC) 5 MG tablet Take 5 mg by mouth daily.      Historical Provider, MD  aspirin 81 MG tablet Take 81 mg by mouth daily.      Historical Provider, MD  baclofen (LIORESAL) 20 MG tablet Take 1 and 1/2 tablets by  mouth in the morning and in the evening and 1 tablet at noon 04/16/15   York Spaniel, MD  Calcium Carbonate-Vitamin D (CALCIUM + D PO) Take by mouth 2 (two) times daily.      Historical Provider, MD  clopidogrel (PLAVIX) 75 MG tablet Take 1 tablet by mouth  daily 06/04/15   Vesta Mixer, MD  gabapentin (NEURONTIN) 300 MG capsule Take 1 capsule (300 mg total) by mouth 4 (four) times daily. 04/18/15   York Spaniel, MD  isosorbide mononitrate (IMDUR) 60 MG 24 hr tablet Take 1 tablet by mouth  every morning 02/19/15   Vesta Mixer, MD  levothyroxine (SYNTHROID, LEVOTHROID) 75 MCG tablet Take 75 mcg by mouth daily.      Historical Provider, MD  metoprolol (LOPRESSOR) 100 MG tablet Take 1 tablet by mouth  twice a day  06/25/15   Vesta Mixer, MD  mirabegron ER (MYRBETRIQ) 25 MG TB24 tablet Take 25 mg by mouth daily.    Historical Provider, MD  Multiple Vitamin (MULTIVITAMIN) tablet Take 1 tablet by mouth daily.      Historical Provider, MD  NITROSTAT 0.4 MG SL tablet DISSOLVE ONE TABLET UNDER THE TONGUE EVERY 5 MINUTES AS NEEDED FOR CHEST PAIN.  DO NOT EXCEED A TOTAL OF 3 DOSES IN 15 MINUTES 10/11/12   Vesta Mixer, MD  ranitidine (ZANTAC) 150 MG tablet Take 150 mg by mouth 2 (two) times daily.      Historical Provider, MD  simvastatin (ZOCOR) 20 MG tablet Take 20 mg by mouth at bedtime.      Historical Provider, MD  traZODone (DESYREL) 50 MG tablet Take 50 mg by mouth at bedtime. Takes one and one half tablet at bedtime.    Historical Provider, MD  triamterene-hydrochlorothiazide (MAXZIDE-25) 37.5-25 MG per tablet Take 1 tablet by mouth daily.      Historical Provider, MD  vitamin B-12 (CYANOCOBALAMIN) 1000 MCG tablet Take 1,000 mcg by mouth daily.    Historical Provider, MD   BP 117/52 mmHg  Pulse 67  Temp(Src) 97.3 F (36.3 C) (Oral)  Resp 18  SpO2 96% Physical Exam  Constitutional: She is oriented to person, place, and time. She appears well-developed and well-nourished.  Drowsy but wakes up quickly  HENT:  Head: Normocephalic and atraumatic.  Mouth/Throat: Mucous membranes are dry.  Eyes: Conjunctivae and EOM are normal. Pupils are equal, round, and reactive to light.  Neck: Normal range of motion. Neck supple.  Cardiovascular: Normal rate, regular rhythm and intact distal pulses.   No murmur heard. Pulmonary/Chest: Effort normal and breath sounds normal. No respiratory distress. She has no wheezes. She has no rales.  Abdominal: Soft. She exhibits no distension. There is no tenderness. There is no rebound and no guarding.  Musculoskeletal: Normal range of motion. She exhibits no edema or tenderness.  Neurological: She is alert and oriented to person, place, and time.  Skin: Skin is warm and  dry. No rash noted. No erythema.  Psychiatric: She has a normal mood and affect. Her behavior is normal.  Nursing note and vitals reviewed.   ED Course  Procedures (including critical care time) Labs Review Labs Reviewed  CBC WITH DIFFERENTIAL/PLATELET  COMPREHENSIVE METABOLIC PANEL  URINALYSIS, ROUTINE W REFLEX MICROSCOPIC (NOT AT [facility])  CBG MONITORING, ED  Rosezena Sensor, ED    Imaging Review Dg Chest 2 View  07/24/2015  CLINICAL DATA:  Status post  fall earlier today. Chest pain today. Initial encounter. EXAM: CHEST  2 VIEW COMPARISON:  CT chest 05/21/2015. Single view of the chest 01/01/2006. FINDINGS: There is cardiomegaly without edema. The lungs are clear. No pneumothorax or pleural effusion. No focal bony abnormality. IMPRESSION: Cardiomegaly without acute disease. Electronically Signed   By: Drusilla Kanner M.D.   On: 07/24/2015 23:10   Ct Head Wo Contrast  07/24/2015  CLINICAL DATA:  Status post fall. Concern for head injury. Initial encounter. EXAM: CT HEAD WITHOUT CONTRAST TECHNIQUE: Contiguous axial images were obtained from the base of the skull through the vertex without intravenous contrast. COMPARISON:  CT of the head performed 12/08/2003, and MRI/MRA of the brain performed 12/20/2005 FINDINGS: There is no evidence of acute infarction, mass lesion, or intra- or extra-axial hemorrhage on CT. Mild periventricular and subcortical white matter change likely reflects small vessel ischemic microangiopathy. Mild cerebellar atrophy is noted. The brainstem and fourth ventricle are within normal limits. The third and lateral ventricles, and basal ganglia are unremarkable in appearance. The cerebral hemispheres are symmetric in appearance, with normal gray-white differentiation. No mass effect or midline shift is seen. There is no evidence of fracture; visualized osseous structures are unremarkable in appearance. The orbits are within normal limits. A small mucus retention cyst or polyp  is noted at the right maxillary sinus. The remaining paranasal sinuses and mastoid air cells are well-aerated. No significant soft tissue abnormalities are seen. IMPRESSION: 1. No evidence of traumatic intracranial injury or fracture. 2. Mild small vessel ischemic microangiopathy. 3. Small mucus retention cyst or polyp at the right maxillary sinus. Electronically Signed   By: Roanna Raider M.D.   On: 07/24/2015 23:20   I have personally reviewed and evaluated these images and lab results as part of my medical decision-making.   EKG Interpretation   Date/Time:  Thursday July 24 2015 22:35:22 EST Ventricular Rate:  76 PR Interval:  212 QRS Duration: 114 QT Interval:  441 QTC Calculation: 496 R Axis:   -40 Text Interpretation:  Sinus rhythm Borderline prolonged PR interval  Borderline IVCD with LAD Low voltage, precordial leads Consider anterior  infarct No significant change since last tracing Confirmed by Anitra Lauth   MD, Alphonzo Lemmings ([id number]) on 07/24/2015 11:23:36 PM      MDM   Final diagnoses:  None    Patient is a 82 year old female with a recent death of her spouse presenting from home via EMS today with a possible fall and family thinks she may be slightly confused. On exam patient does not appear confused but does appear drowsy however she took her nighttime medication she would've had trazodone which could be causing her drowsiness. She denies any symptoms but does appear slightly dehydrated with dry mucous membranes. Family is unsure if she ate or drank today. Patient states she laid in bed all day.  Patient denies any recent medication changes and appears that she has taken her medications today. She has no evidence of focal weakness or encephalopathy. Exam is unrevealing.  CBC, CMP, UA, troponin, head CT, chest x-ray and EKG pending. Patient given IV fluids.  Imaging neg.  Labs pending and pt checked out to Dr. Blinda Leatherwood at 0100.  Gwyneth Sprout, MD 07/25/15 [phone]

## 2015-07-24 NOTE — ED Notes (Signed)
Per EMS, pt from home with c/o a fall, with no obvious signs of trauma. Pt fell 2 days ago as well and has a hematoma to the left arm. Pt lives alone. Per family, pt has baseline dementia. A&O x 4 at this time. BP- 130/80

## 2015-07-24 NOTE — ED Notes (Signed)
Per EMS, pt stated she did not fall, here pt states she did fall. Denies pain, unsure how fall happened.

## 2015-07-25 LAB — CBC WITH DIFFERENTIAL/PLATELET
Basophils Absolute: 0 10*3/uL (ref 0.0–0.1)
Basophils Relative: 0 %
EOS PCT: 3 %
Eosinophils Absolute: 0.2 10*3/uL (ref 0.0–0.7)
HCT: 43.9 % (ref 36.0–46.0)
Hemoglobin: 14.7 g/dL (ref 12.0–15.0)
LYMPHS ABS: 1.6 10*3/uL (ref 0.7–4.0)
LYMPHS PCT: 28 %
MCH: 29.4 pg (ref 26.0–34.0)
MCHC: 33.5 g/dL (ref 30.0–36.0)
MCV: 87.8 fL (ref 78.0–100.0)
MONO ABS: 0.4 10*3/uL (ref 0.1–1.0)
MONOS PCT: 6 %
Neutro Abs: 3.6 10*3/uL (ref 1.7–7.7)
Neutrophils Relative %: 63 %
PLATELETS: 138 10*3/uL — AB (ref 150–400)
RBC: 5 MIL/uL (ref 3.87–5.11)
RDW: 14 % (ref 11.5–15.5)
WBC: 5.7 10*3/uL (ref 4.0–10.5)

## 2015-07-25 LAB — COMPREHENSIVE METABOLIC PANEL
ALT: 18 U/L (ref 14–54)
AST: 26 U/L (ref 15–41)
Albumin: 3.9 g/dL (ref 3.5–5.0)
Alkaline Phosphatase: 34 U/L — ABNORMAL LOW (ref 38–126)
Anion gap: 14 (ref 5–15)
BUN: 16 mg/dL (ref 6–20)
CHLORIDE: 97 mmol/L — AB (ref 101–111)
CO2: 33 mmol/L — AB (ref 22–32)
CREATININE: 0.78 mg/dL (ref 0.44–1.00)
Calcium: 10 mg/dL (ref 8.9–10.3)
GFR calc non Af Amer: >60 mL/min (ref >60)
Glucose, Bld: 103 mg/dL — ABNORMAL HIGH (ref 65–99)
Potassium: 3.8 mmol/L (ref 3.5–5.1)
SODIUM: 144 mmol/L (ref 135–145)
Total Bilirubin: 0.6 mg/dL (ref 0.3–1.2)
Total Protein: 6.9 g/dL (ref 6.5–8.1)

## 2015-07-25 LAB — URINALYSIS, ROUTINE W REFLEX MICROSCOPIC
Bilirubin Urine: NEGATIVE
Glucose, UA: NEGATIVE mg/dL
Ketones, ur: NEGATIVE mg/dL
NITRITE: NEGATIVE
PH: 6.5 (ref 5.0–8.0)
PROTEIN: NEGATIVE mg/dL
SPECIFIC GRAVITY, URINE: 1.018 (ref 1.005–1.030)

## 2015-07-25 LAB — URINE MICROSCOPIC-ADD ON

## 2015-07-25 MED ORDER — DEXTROSE 5 % IV SOLN
1.0000 g | Freq: Once | INTRAVENOUS | Status: AC
  Administered 2015-07-25: 1 g via INTRAVENOUS
  Filled 2015-07-25: qty 10

## 2015-07-25 MED ORDER — CEFUROXIME AXETIL 250 MG PO TABS: 250.0000 mg | ORAL_TABLET | Freq: Two times a day (BID) | ORAL | Status: DC

## 2015-07-25 NOTE — ED Provider Notes (Signed)
Patient signed out to me by Dr. Revonda Humphrey to follow-up on blood work. Patient seen for mental status changes. This is felt to be multifactorial. Patient's husband died one week ago and she has been grieving. Additionally, it is thought that she took her nighttime trazodone before coming to the ER, has been sleepy here but arousable. Patient also found to have urinary tract infection, likely causing mild delirium. She is not septic. Vital signs are stable. Remainder of her workup was unremarkable. Family is comfortable taking her home and caring for her. She was initiated on Rocephin here in the ER. Culture obtained. Will continue Ceftin outpatient, follow-up with primary doctor. Return if symptoms worsen.  Gilda Crease, MD 07/25/15 [phone]

## 2015-07-25 NOTE — Discharge Instructions (Signed)

## 2015-07-26 LAB — URINE CULTURE

## 2015-07-30 DIAGNOSIS — I10 Essential (primary) hypertension: Secondary | ICD-10-CM | POA: Diagnosis not present

## 2015-09-09 ENCOUNTER — Other Ambulatory Visit: Payer: Self-pay | Admitting: Neurology

## 2015-09-30 DIAGNOSIS — H5213 Myopia, bilateral: Secondary | ICD-10-CM | POA: Diagnosis not present

## 2015-09-30 DIAGNOSIS — H52203 Unspecified astigmatism, bilateral: Secondary | ICD-10-CM | POA: Diagnosis not present

## 2015-09-30 DIAGNOSIS — H524 Presbyopia: Secondary | ICD-10-CM | POA: Diagnosis not present

## 2015-09-30 DIAGNOSIS — H04123 Dry eye syndrome of bilateral lacrimal glands: Secondary | ICD-10-CM | POA: Diagnosis not present

## 2015-09-30 DIAGNOSIS — Z961 Presence of intraocular lens: Secondary | ICD-10-CM | POA: Diagnosis not present

## 2015-10-22 ENCOUNTER — Other Ambulatory Visit: Payer: Self-pay | Admitting: Neurology

## 2015-10-27 ENCOUNTER — Telehealth: Payer: Self-pay

## 2015-10-27 ENCOUNTER — Ambulatory Visit: Payer: Medicare Other | Admitting: Neurology

## 2015-10-27 NOTE — Telephone Encounter (Signed)
Attempts to call pt to confirm today's appt have been unsuccessful. Listed telephone # was disconnected. Was able to get in touch w/ pt's son, Onalee HuaDavid, this morning. However, he is pt's transportation and cannot get pt to her scheduled appt today d/t being w/ his son. Reports that pt's husband recently passed away and she has moved to [place]. Her new # is [phone] (updated in chart). Called pt and rescheduled today's appt for next Monday (5/8) at noon.

## 2015-11-03 ENCOUNTER — Encounter: Payer: Self-pay | Admitting: Neurology

## 2015-11-03 ENCOUNTER — Ambulatory Visit (INDEPENDENT_AMBULATORY_CARE_PROVIDER_SITE_OTHER): Payer: Medicare Other | Admitting: Neurology

## 2015-11-03 VITALS — BP 116/64 | HR 68 | Ht 59.0 in | Wt 165.0 lb

## 2015-11-03 DIAGNOSIS — G250 Essential tremor: Secondary | ICD-10-CM | POA: Diagnosis not present

## 2015-11-03 DIAGNOSIS — R269 Unspecified abnormalities of gait and mobility: Secondary | ICD-10-CM | POA: Diagnosis not present

## 2015-11-03 DIAGNOSIS — G253 Myoclonus: Secondary | ICD-10-CM | POA: Diagnosis not present

## 2015-11-03 NOTE — Progress Notes (Signed)
Reason for visit: Myoclonus of legs  Kimberly Bass  History of present illness:  Kimberly Bass with a history of an essential tremor that mainly affects the head and neck, and some episodes of myoclonic movements of both legs. The patient indicates that the episodes become more prominent with stress. She has lost her husband since last seen, she has lost about 10 pounds of weight. The patient has made a transition from her home to an independent living situation. The patient comes in today with her son who is her power of attorney. The patient was recently in the emergency room on 07/24/2015 with a urinary tract infection. The patient has not had any falls since last seen, she uses a walker when she is outside the house. She does not operate a motor vehicle, but she is able to get transportation through her facility. She is on baclofen and gabapentin for her myoclonic jerks, she seems to be doing about the same as she was. She denies any issues sleeping at night, and she denies a restless legs type feeling.  Past Medical History  Diagnosis Date  . Coronary artery disease   . Hyperlipidemia   . Thyroid disease   . Hypertension   . Hx of transient ischemic attack (TIA)   . Hyperthyroidism   . Lumbosacral spondylosis   . Mild obesity   . Benign essential tremor   . Lower extremity myoclonus   . Seizures (HCC)     History is questionable  . Myoclonus     Lower extremity involvement  . H/O radioactive iodine thyroid ablation   . Obesity   . Gait disorder     Past Surgical History  Procedure Laterality Date  . Cardiac catheterization  01/03/2006  . Hx back surgery    . Tonsillectomy    . Appendectomy    . Abdominal hysterectomy    . Breast lumpectomy    . Myocardial infarction      Family History  Problem Relation Age of Onset  . Heart disease Father   . Heart attack Mother   . Stroke Sister   . Heart  attack Sister   . Heart attack Brother     Possible  . Diabetes Sister     Social history:  reports that she has never smoked. She has never used smokeless tobacco. She reports that she does not drink alcohol or use illicit drugs.    Allergies  Allergen Reactions  . Codeine Nausea Only  . Meperidine Hcl Nausea Only  . Morphine Nausea Only  . Penicillins Nausea Only    Medications:  Prior to Admission medications   Medication Sig Start Date End Date Taking? Authorizing Provider  acetaminophen (TYLENOL ARTHRITIS PAIN) 650 MG CR tablet Take 650 mg by mouth every 8 (eight) hours as needed. Taking 2 tid    Yes Historical Provider, MD  amLODipine (NORVASC) 5 MG tablet Take 5 mg by mouth daily.     Yes Historical Provider, MD  aspirin 81 MG tablet Take 81 mg by mouth daily.     Yes Historical Provider, MD  baclofen (LIORESAL) 20 MG tablet TAKE 1 AND 1/2 TABLETS BY  MOUTH IN THE MORNING AND IN THE EVENING AND 1 TABLET AT NOON 10/22/15  Yes York Spanielharles K Willis, MD  Calcium Carbonate-Vitamin D (CALCIUM + D PO) Take by mouth 2 (two) times daily.     Yes Historical Provider, MD  cefUROXime (CEFTIN) 250 MG tablet Take 1 tablet (250 mg total) by mouth 2 (two) times daily with a meal. 07/25/15  Yes Gilda Crease, MD  clopidogrel (PLAVIX) 75 MG tablet Take 1 tablet by mouth  daily 06/04/15  Yes Vesta Mixer, MD  gabapentin (NEURONTIN) 300 MG capsule Take 1 capsule (300 mg total) by mouth 4 (four) times daily. 04/18/15  Yes York Spaniel, MD  gabapentin (NEURONTIN) 300 MG capsule TAKE 1 CAPSULE BY MOUTH 4  TIMES DAILY. 09/09/15  Yes York Spaniel, MD  isosorbide mononitrate (IMDUR) 60 MG 24 hr tablet Take 1 tablet by mouth  every morning 02/19/15  Yes Vesta Mixer, MD  levothyroxine (SYNTHROID, LEVOTHROID) 75 MCG tablet Take 75 mcg by mouth daily.     Yes Historical Provider, MD  metoprolol (LOPRESSOR) 100 MG tablet Take 1 tablet by mouth  twice a day 06/25/15  Yes Vesta Mixer, MD    mirabegron ER (MYRBETRIQ) 25 MG TB24 tablet Take 25 mg by mouth daily.   Yes Historical Provider, MD  Multiple Vitamin (MULTIVITAMIN) tablet Take 1 tablet by mouth daily.     Yes Historical Provider, MD  NITROSTAT 0.4 MG SL tablet DISSOLVE ONE TABLET UNDER THE TONGUE EVERY 5 MINUTES AS NEEDED FOR CHEST PAIN.  DO NOT EXCEED A TOTAL OF 3 DOSES IN 15 MINUTES 10/11/12  Yes Vesta Mixer, MD  ranitidine (ZANTAC) 150 MG tablet Take 150 mg by mouth 2 (two) times daily.     Yes Historical Provider, MD  simvastatin (ZOCOR) 20 MG tablet Take 20 mg by mouth at bedtime.     Yes Historical Provider, MD  traZODone (DESYREL) 50 MG tablet Take 50 mg by mouth at bedtime. Takes one and one half tablet at bedtime.   Yes Historical Provider, MD  triamterene-hydrochlorothiazide (MAXZIDE-25) 37.5-25 MG per tablet Take 1 tablet by mouth daily.     Yes Historical Provider, MD  vitamin B-12 (CYANOCOBALAMIN) 1000 MCG tablet Take 1,000 mcg by mouth daily.   Yes Historical Provider, MD    ROS:  Out of a complete 14 system review of symptoms, the patient complains only of the following symptoms, and all other reviewed systems are negative.  Gait disturbance Tremor Leg jerking  Blood pressure 116/64, pulse 68, height 4\' 11"  (1.499 m), weight 165 lb (74.844 kg).  Physical Exam  General: The patient is alert and cooperative at the time of the examination. The patient is moderately obese.  Skin: No significant peripheral edema is noted.   Neurologic Exam  Mental status: The patient is alert and oriented x 3 at the time of the examination. The patient has apparent normal recent and remote memory, with an apparently normal attention span and concentration ability.   Cranial nerves: Facial symmetry is present. Speech is normal, no aphasia or dysarthria is noted. Extraocular movements are full. Visual fields are full. A side-to-side head tremor is noted. A slight vocal tremor is also noted.  Motor: The patient has  good strength in all 4 extremities.  Sensory examination: Soft touch sensation is symmetric on the face, arms, and legs.  Coordination: The patient has good finger-nose-finger and heel-to-shin bilaterally. The patient occasionally will have some flexion jerking of the legs, this seems to be quite distractible, the patient does not have any jerking and less attention is directed toward the legs.  Gait and station: The patient has a normal gait. Tandem gait is normal. Romberg is negative. No drift is seen.  Reflexes: Deep tendon reflexes are symmetric.   Assessment/Plan:  1. Essential tremor  2. Gait disturbance  3. Leg myoclonus  The patient is at her usual baseline, we will not alter medication dosing at this time, the patient just recently got prescriptions for her baclofen and gabapentin. She will follow-up in one year, sooner if needed.  Marlan Palau MD 11/03/2015 7:49 PM  [facility] [address] [place], [place] [address]  Phone [phone] Fax [phone]

## 2015-11-05 DIAGNOSIS — I2510 Atherosclerotic heart disease of native coronary artery without angina pectoris: Secondary | ICD-10-CM | POA: Diagnosis not present

## 2015-11-05 DIAGNOSIS — I10 Essential (primary) hypertension: Secondary | ICD-10-CM | POA: Diagnosis not present

## 2015-11-20 ENCOUNTER — Ambulatory Visit: Payer: Self-pay | Admitting: Neurology

## 2015-11-27 DIAGNOSIS — N390 Urinary tract infection, site not specified: Secondary | ICD-10-CM | POA: Diagnosis not present

## 2015-11-27 DIAGNOSIS — R300 Dysuria: Secondary | ICD-10-CM | POA: Diagnosis not present

## 2015-11-27 DIAGNOSIS — R3915 Urgency of urination: Secondary | ICD-10-CM | POA: Diagnosis not present

## 2015-12-02 ENCOUNTER — Other Ambulatory Visit: Payer: Self-pay | Admitting: Cardiovascular Disease

## 2015-12-24 ENCOUNTER — Other Ambulatory Visit: Payer: Self-pay | Admitting: Cardiovascular Disease

## 2016-01-19 DIAGNOSIS — R3915 Urgency of urination: Secondary | ICD-10-CM | POA: Diagnosis not present

## 2016-01-19 DIAGNOSIS — N390 Urinary tract infection, site not specified: Secondary | ICD-10-CM | POA: Diagnosis not present

## 2016-02-11 ENCOUNTER — Ambulatory Visit (INDEPENDENT_AMBULATORY_CARE_PROVIDER_SITE_OTHER): Payer: Medicare Other | Admitting: Cardiovascular Disease

## 2016-02-11 ENCOUNTER — Encounter: Payer: Self-pay | Admitting: Cardiovascular Disease

## 2016-02-11 VITALS — BP 116/76 | HR 62 | Ht 59.0 in | Wt 159.0 lb

## 2016-02-11 DIAGNOSIS — I2510 Atherosclerotic heart disease of native coronary artery without angina pectoris: Secondary | ICD-10-CM

## 2016-02-11 NOTE — Patient Instructions (Signed)

## 2016-02-11 NOTE — Progress Notes (Signed)
Kimberly BrownieJeanneane H Bass Date of Birth  07/27/1925       [facility]    [place] [facility] [address], [address]  [address], [address] [place], [place]  [address]   [place], [place]  [id number] [phone]     [phone]   Fax  [phone]    Fax [phone]  Problem List: 1. Coronary artery disease-is a tight stenosis in the distal aspect of her circumflex artery 2. Hyperthyroidism 3. Hyperlipidemia 4. Hypertension  History of Present Illness: Kimberly Bass is an 82 yo with a hx of CAD. She had a heart catheterization on January 03, 2006. She has a very tight stenosis in the terminal aspect of a large posterior lateral branch. This stenosis is very tortuous and is diffusely diseased and is not a candidate for PCI.  She's not had any episodes of chest pain or shortness breath. She's been able to do all of her normal activities without any limitations. She used  to have a good bit of angina but has not noticed any in the past several months.  December 27, 2012:  Kimberly Bass is doing OK.  She is getting weaker - came in with a walker today.  She is able to walk shorter distances on her own.  She has leg weakness walking long distances.    She has not had to use her NTG much - on occasion.     01/10/2014: Pt is doing ok.  Still using the walker for long distance walking.  Able to get around the house without.  No CP.   BP is OK  January 20, 2015:  Doing well. No CP .   Aug. 16, 2017:  Doing very well. Using a rolling walker. Does not use the walker at home  No Cp or dyspnea.     Current Outpatient Prescriptions on File Prior to Visit  Medication Sig Dispense Refill  . acetaminophen (TYLENOL ARTHRITIS PAIN) 650 MG CR tablet Take 650 mg by mouth every 8 (eight) hours as needed. Taking 2 tid     . amLODipine (NORVASC) 5 MG tablet Take 5 mg by mouth daily.      Marland Kitchen. aspirin 81 MG tablet Take 81 mg by mouth daily.      . baclofen (LIORESAL) 20 MG tablet TAKE 1 AND 1/2  TABLETS BY  MOUTH IN THE MORNING AND IN THE EVENING AND 1 TABLET AT NOON 360 tablet 3  . Calcium Carbonate-Vitamin D (CALCIUM + D PO) Take by mouth 2 (two) times daily.      . cefUROXime (CEFTIN) 250 MG tablet Take 1 tablet (250 mg total) by mouth 2 (two) times daily with a meal. 20 tablet 0  . clopidogrel (PLAVIX) 75 MG tablet Take 1 tablet by mouth  daily 90 tablet 3  . gabapentin (NEURONTIN) 300 MG capsule Take 1 capsule (300 mg total) by mouth 4 (four) times daily. 360 capsule 1  . isosorbide mononitrate (IMDUR) 60 MG 24 hr tablet Take 1 tablet (60 mg total) by mouth daily. Please call and schedule a one year follow up appointment 90 tablet 0  . levothyroxine (SYNTHROID, LEVOTHROID) 75 MCG tablet Take 75 mcg by mouth daily.      . metoprolol (LOPRESSOR) 100 MG tablet Take 1 tablet (100 mg total) by mouth 2 (two) times daily. Please call and schedule a 1 yr follow up appointment 180 tablet 0  . mirabegron ER (MYRBETRIQ) 25 MG TB24 tablet Take 25 mg by  mouth daily.    . Multiple Vitamin (MULTIVITAMIN) tablet Take 1 tablet by mouth daily.      Marland Kitchen. NITROSTAT 0.4 MG SL tablet DISSOLVE ONE TABLET UNDER THE TONGUE EVERY 5 MINUTES AS NEEDED FOR CHEST PAIN.  DO NOT EXCEED A TOTAL OF 3 DOSES IN 15 MINUTES 25 tablet 1  . ranitidine (ZANTAC) 150 MG tablet Take 150 mg by mouth 2 (two) times daily.      . simvastatin (ZOCOR) 20 MG tablet Take 20 mg by mouth at bedtime.      . traZODone (DESYREL) 50 MG tablet Take 50 mg by mouth at bedtime. Takes one and one half tablet at bedtime.    . triamterene-hydrochlorothiazide (MAXZIDE-25) 37.5-25 MG per tablet Take 1 tablet by mouth daily.      . vitamin B-12 (CYANOCOBALAMIN) 1000 MCG tablet Take 1,000 mcg by mouth daily.     No current facility-administered medications on file prior to visit.     Allergies  Allergen Reactions  . Codeine Nausea Only  . Meperidine Hcl Nausea Only  . Morphine Nausea Only  . Penicillins Nausea Only    Past Medical History:    Diagnosis Date  . Benign essential tremor   . Coronary artery disease   . Gait disorder   . H/O radioactive iodine thyroid ablation   . Hx of transient ischemic attack (TIA)   . Hyperlipidemia   . Hypertension   . Hyperthyroidism   . Lower extremity myoclonus   . Lumbosacral spondylosis   . Mild obesity   . Myoclonus    Lower extremity involvement  . Obesity   . Seizures (HCC)    History is questionable  . Thyroid disease     Past Surgical History:  Procedure Laterality Date  . ABDOMINAL HYSTERECTOMY    . APPENDECTOMY    . BREAST LUMPECTOMY    . CARDIAC CATHETERIZATION  01/03/2006  . hx back surgery    . myocardial infarction    . TONSILLECTOMY      History  Smoking Status  . Never Smoker  Smokeless Tobacco  . Never Used    History  Alcohol Use No    Family History  Problem Relation Age of Onset  . Heart disease Father   . Heart attack Mother   . Stroke Sister   . Heart attack Sister   . Heart attack Brother     Possible  . Diabetes Sister     Reviw of Systems:  Reviewed in the HPI.  All other systems are negative.  Physical Exam: Blood pressure 116/76, pulse 62, height 4\' 11"  (1.499 m), weight 159 lb (72.1 kg). General: Well developed, well nourished, in no acute distress.  She has a benign tremor.  Head: Normocephalic, atraumatic, sclera non-icteric, mucus membranes are moist,   Neck: Supple. Carotids are 2 + without bruits. No JVD  Lungs: Clear bilaterally to auscultation.  Heart: regular rate.  normal  S1 S2. No murmurs, gallops or rubs.  Abdomen: Soft, non-tender, non-distended with normal bowel sounds. No hepatomegaly. No rebound/guarding. No masses.  Msk:  Strength and tone are normal  Extremities: No clubbing or cyanosis. No edema.  Distal pedal pulses are 2+ and equal bilaterally.  Neuro: Alert and oriented X 3. Moves all extremities spontaneously.  Psych:  Responds to questions appropriately with a normal affect.  ECG: Aug.  16, 2017: NSR at 62.  1st degree AV block .  Poor R wave progression   Assessment / Plan:  1. Coronary artery disease-is a tight stenosis in the distal aspect of her circumflex artery   Doing well. No angina,. Does have some DOE  Will see her in 1 year.    2. Hyperthyroidism  3. Hyperlipidemia  4. Hypertension - BP is well controlled.    Kristeen Miss, MD  02/11/2016 3:51 PM    [facility] [address] [place],  [address] [place], [place]  [address] Pager [phone] Phone: [phone]; Fax: [phone]   [facility]  [address] [place], [place]  [address] [phone]   Fax [phone]

## 2016-03-02 DIAGNOSIS — E890 Postprocedural hypothyroidism: Secondary | ICD-10-CM | POA: Diagnosis not present

## 2016-03-12 ENCOUNTER — Telehealth: Payer: Self-pay | Admitting: Neurology

## 2016-03-12 MED ORDER — GABAPENTIN 400 MG PO CAPS: 400.0000 mg | ORAL_CAPSULE | Freq: Four times a day (QID) | ORAL | 3 refills | Status: DC

## 2016-03-12 NOTE — Telephone Encounter (Signed)
I called patient. She is having some increased jumpiness of the legs. I will go up on the gabapentin taking 400 mg 4 times daily.

## 2016-03-12 NOTE — Telephone Encounter (Signed)
Pt called said she is having 3-7 leg spasms/day. She is "afraid" to get up that she might fall. She is wanting to know if the gabapentin (NEURONTIN) 300 MG capsule should be increased. Please call

## 2016-03-15 NOTE — Telephone Encounter (Signed)
error 

## 2016-03-30 ENCOUNTER — Other Ambulatory Visit: Payer: Self-pay | Admitting: Cardiovascular Disease

## 2016-03-31 ENCOUNTER — Other Ambulatory Visit: Payer: Self-pay | Admitting: *Deleted

## 2016-03-31 MED ORDER — ISOSORBIDE MONONITRATE ER 60 MG PO TB24: 60.0000 mg | ORAL_TABLET | Freq: Every day | ORAL | 3 refills | Status: DC

## 2016-04-06 ENCOUNTER — Other Ambulatory Visit: Payer: Self-pay | Admitting: Cardiovascular Disease

## 2016-04-07 ENCOUNTER — Other Ambulatory Visit: Payer: Self-pay | Admitting: *Deleted

## 2016-04-07 MED ORDER — CLOPIDOGREL BISULFATE 75 MG PO TABS: 75.0000 mg | ORAL_TABLET | Freq: Every day | ORAL | 3 refills | Status: DC

## 2016-04-07 MED ORDER — METOPROLOL TARTRATE 100 MG PO TABS: 100.0000 mg | ORAL_TABLET | Freq: Two times a day (BID) | ORAL | 3 refills | Status: DC

## 2016-04-30 ENCOUNTER — Other Ambulatory Visit: Payer: Self-pay | Admitting: Neurology

## 2016-05-03 DIAGNOSIS — I10 Essential (primary) hypertension: Secondary | ICD-10-CM | POA: Diagnosis not present

## 2016-05-03 DIAGNOSIS — M2550 Pain in unspecified joint: Secondary | ICD-10-CM | POA: Diagnosis not present

## 2016-05-03 DIAGNOSIS — E890 Postprocedural hypothyroidism: Secondary | ICD-10-CM | POA: Diagnosis not present

## 2016-05-03 DIAGNOSIS — N390 Urinary tract infection, site not specified: Secondary | ICD-10-CM | POA: Diagnosis not present

## 2016-05-07 DIAGNOSIS — Z0001 Encounter for general adult medical examination with abnormal findings: Secondary | ICD-10-CM | POA: Diagnosis not present

## 2016-06-19 ENCOUNTER — Emergency Department (HOSPITAL_COMMUNITY): Payer: Medicare Other

## 2016-06-19 ENCOUNTER — Emergency Department (HOSPITAL_COMMUNITY)
Admission: EM | Admit: 2016-06-19 | Discharge: 2016-06-19 | Disposition: A | Discharge status: Home / Self Care | Payer: Medicare Other | Attending: Emergency Medicine | Admitting: Emergency Medicine

## 2016-06-19 ENCOUNTER — Encounter (HOSPITAL_COMMUNITY): Payer: Self-pay | Admitting: Certified Nurse Midwife

## 2016-06-19 DIAGNOSIS — E039 Hypothyroidism, unspecified: Secondary | ICD-10-CM | POA: Insufficient documentation

## 2016-06-19 DIAGNOSIS — Z7982 Long term (current) use of aspirin: Secondary | ICD-10-CM | POA: Insufficient documentation

## 2016-06-19 DIAGNOSIS — Y9389 Activity, other specified: Secondary | ICD-10-CM | POA: Insufficient documentation

## 2016-06-19 DIAGNOSIS — I2510 Atherosclerotic heart disease of native coronary artery without angina pectoris: Secondary | ICD-10-CM | POA: Insufficient documentation

## 2016-06-19 DIAGNOSIS — I10 Essential (primary) hypertension: Secondary | ICD-10-CM | POA: Insufficient documentation

## 2016-06-19 DIAGNOSIS — S098XXA Other specified injuries of head, initial encounter: Secondary | ICD-10-CM | POA: Diagnosis not present

## 2016-06-19 DIAGNOSIS — S0993XA Unspecified injury of face, initial encounter: Secondary | ICD-10-CM | POA: Diagnosis not present

## 2016-06-19 DIAGNOSIS — Y999 Unspecified external cause status: Secondary | ICD-10-CM | POA: Insufficient documentation

## 2016-06-19 DIAGNOSIS — S0181XA Laceration without foreign body of other part of head, initial encounter: Secondary | ICD-10-CM | POA: Diagnosis not present

## 2016-06-19 DIAGNOSIS — Y92009 Unspecified place in unspecified non-institutional (private) residence as the place of occurrence of the external cause: Secondary | ICD-10-CM | POA: Insufficient documentation

## 2016-06-19 DIAGNOSIS — W19XXXA Unspecified fall, initial encounter: Secondary | ICD-10-CM

## 2016-06-19 DIAGNOSIS — S0990XA Unspecified injury of head, initial encounter: Secondary | ICD-10-CM | POA: Diagnosis not present

## 2016-06-19 DIAGNOSIS — Z79899 Other long term (current) drug therapy: Secondary | ICD-10-CM | POA: Insufficient documentation

## 2016-06-19 DIAGNOSIS — S199XXA Unspecified injury of neck, initial encounter: Secondary | ICD-10-CM | POA: Diagnosis not present

## 2016-06-19 DIAGNOSIS — S0083XA Contusion of other part of head, initial encounter: Secondary | ICD-10-CM | POA: Diagnosis not present

## 2016-06-19 DIAGNOSIS — W1839XA Other fall on same level, initial encounter: Secondary | ICD-10-CM | POA: Insufficient documentation

## 2016-06-19 DIAGNOSIS — Z8673 Personal history of transient ischemic attack (TIA), and cerebral infarction without residual deficits: Secondary | ICD-10-CM | POA: Diagnosis not present

## 2016-06-19 MED ORDER — LIDOCAINE-EPINEPHRINE 1 %-1:100000 IJ SOLN
10.0000 mL | Freq: Once | INTRAMUSCULAR | Status: DC
  Filled 2016-06-19: qty 10

## 2016-06-19 MED ORDER — LIDOCAINE HCL (PF) 1 % IJ SOLN
INTRAMUSCULAR | Status: AC
  Filled 2016-06-19: qty 30

## 2016-06-19 MED ORDER — LIDOCAINE-EPINEPHRINE (PF) 2 %-1:200000 IJ SOLN
20.0000 mL | Freq: Once | INTRAMUSCULAR | Status: AC
  Administered 2016-06-19: 20 mL via INTRADERMAL
  Filled 2016-06-19: qty 20

## 2016-06-19 MED ORDER — LIDOCAINE-EPINEPHRINE 1 %-1:100000 IJ SOLN: 10.0000 mL | Freq: Once | INTRAMUSCULAR | Status: DC

## 2016-06-19 NOTE — ED Notes (Signed)
ED Provider at bedside. 

## 2016-06-19 NOTE — ED Notes (Signed)
Patient transported to CT 

## 2016-06-19 NOTE — ED Triage Notes (Signed)
Pt arrives via [facility] for a fall at home. Pt fell and hit her head on the doorknob. Pt's head bandaged by EMS. Pt has injury to her left hand an complains of left shoulder pain.

## 2016-06-19 NOTE — ED Notes (Signed)
Wheeled pt in wheelchair to E37, pt placed in gown. ED provider with pt.

## 2016-06-19 NOTE — ED Provider Notes (Signed)
[facility]EMERGENCY DEPT Provider Note   CSN: [id number] Arrival date & time: 06/19/16  1045     History   Chief Complaint Chief Complaint  Patient presents with  . Fall  . Laceration  . Shoulder Pain    HPI Kimberly Bass is a 82 y.o. female.  HPI   Slept well last night, got up this AM to go to the bathroom And reached down and unplugged the nightlight, and as straightening back up fell into the door and hit head on the handle, it was opened and it closed as she hit it First thing noticed was pain to forehead, throbbing, bleeding from forehead Called 911 from cell phone in hand Not feeling dizzy or off balance  No hip pain and leg pain "Feeling perfectly normal in every way" except forehead hurts  Past Medical History:  Diagnosis Date  . Benign essential tremor   . Coronary artery disease   . Gait disorder   . H/O radioactive iodine thyroid ablation   . Hx of transient ischemic attack (TIA)   . Hyperlipidemia   . Hypertension   . Hyperthyroidism   . Lower extremity myoclonus   . Lumbosacral spondylosis   . Mild obesity   . Myoclonus    Lower extremity involvement  . Obesity   . Seizures (HCC)    History is questionable  . Thyroid disease     Patient Active Problem List   Diagnosis Date Noted  . Tremor, essential 11/03/2015  . 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 Abnormality of gait 08/15/2012  . Essential and other specified forms of tremor 08/15/2012  . Degeneration of lumbar or lumbosacral intervertebral disc 08/15/2012  . Hx of low back pain 08/15/2012  . Pain of left leg 08/15/2012  . CAD (coronary artery disease) 12/23/2010  . Palpitations 12/23/2010  . Hx of transient ischemic attack (TIA)   . CONSTIPATION 02/27/2009  . PERSONAL HX COLONIC POLYPS 02/27/2009    Past Surgical History:  Procedure Laterality Date  . ABDOMINAL HYSTERECTOMY    . APPENDECTOMY    . BREAST LUMPECTOMY    . CARDIAC CATHETERIZATION  01/03/2006  . hx back surgery    .  myocardial infarction    . TONSILLECTOMY      OB History    No data available       Home Medications    Prior to Admission medications   Medication Sig Start Date End Date Taking? Authorizing Provider  acetaminophen (TYLENOL ARTHRITIS PAIN) 650 MG CR tablet Take 650 mg by mouth every 8 (eight) hours as needed. Taking 2 tid    Yes Historical Provider, MD  amLODipine (NORVASC) 5 MG tablet Take 5 mg by mouth daily.     Yes Historical Provider, MD  aspirin 81 MG tablet Take 81 mg by mouth daily.     Yes Historical Provider, MD  baclofen (LIORESAL) 20 MG tablet TAKE 1 AND 1/2 TABLETS BY  MOUTH IN THE MORNING AND IN THE EVENING AND 1 TABLET AT NOON 10/22/15  Yes York Spaniel, MD  Calcium Carbonate-Vitamin D (CALCIUM + D PO) Take by mouth 2 (two) times daily.     Yes Historical Provider, MD  clopidogrel (PLAVIX) 75 MG tablet Take 1 tablet (75 mg total) by mouth daily. 04/07/16  Yes Vesta Mixer, MD  gabapentin (NEURONTIN) 400 MG capsule TAKE 1 CAPSULE BY MOUTH 4  TIMES DAILY Patient taking differently: TAKE 1 CAPSULE BY MOUTH IN THE EVENING AND 1 CAPSULE AT BEDTIME 04/30/16  Yes York Spaniel, MD  isosorbide mononitrate (IMDUR) 60 MG 24 hr tablet Take 1 tablet (60 mg total) by mouth daily. Please call and schedule a one year follow up appointment 03/31/16  Yes Vesta Mixer, MD  levothyroxine (SYNTHROID, LEVOTHROID) 75 MCG tablet Take 75 mcg by mouth daily.     Yes Historical Provider, MD  metoprolol (LOPRESSOR) 100 MG tablet Take 1 tablet (100 mg total) by mouth 2 (two) times daily. 04/07/16  Yes Vesta Mixer, MD  mirabegron ER (MYRBETRIQ) 50 MG TB24 tablet Take 50 mg by mouth daily.    Yes Historical Provider, MD  Multiple Vitamin (MULTIVITAMIN) tablet Take 1 tablet by mouth daily.     Yes Historical Provider, MD  NITROSTAT 0.4 MG SL tablet DISSOLVE ONE TABLET UNDER THE TONGUE EVERY 5 MINUTES AS NEEDED FOR CHEST PAIN.  DO NOT EXCEED A TOTAL OF 3 DOSES IN 15 MINUTES 10/11/12  Yes  Vesta Mixer, MD  ranitidine (ZANTAC) 150 MG tablet Take 150 mg by mouth 2 (two) times daily.     Yes Historical Provider, MD  simvastatin (ZOCOR) 20 MG tablet Take 20 mg by mouth at bedtime.     Yes Historical Provider, MD  traZODone (DESYREL) 50 MG tablet Take 75 mg by mouth at bedtime. Takes one and one half tablet at bedtime.   Yes Historical Provider, MD  vitamin B-12 (CYANOCOBALAMIN) 1000 MCG tablet Take 1,000 mcg by mouth daily.    Historical Provider, MD    Family History Family History  Problem Relation Age of Onset  . Heart disease Father   . Heart attack Mother   . Stroke Sister   . Heart attack Sister   . Heart attack Brother     Possible  . Diabetes Sister     Social History Social History  Substance Use Topics  . Smoking status: Never Smoker  . Smokeless tobacco: Never Used  . Alcohol use No     Allergies   Codeine; Meperidine hcl; Morphine; and Penicillins   Review of Systems Review of Systems  Constitutional: Negative for fever.  HENT: Negative for sore throat.   Eyes: Negative for visual disturbance.  Respiratory: Negative for cough and shortness of breath.   Cardiovascular: Negative for chest pain.  Gastrointestinal: Negative for abdominal pain, nausea and vomiting.  Genitourinary: Negative for difficulty urinating.  Musculoskeletal: Negative for back pain and neck pain.  Skin: Positive for wound. Negative for rash.  Neurological: Negative for syncope and headaches.     Physical Exam Updated Vital Signs BP 138/62   Pulse 77   Temp 97.9 F (36.6 C) (Oral)   Resp 16   Ht 4\' 11"  (1.499 m)   Wt 159 lb (72.1 kg)   SpO2 92%   BMI 32.11 kg/m   Physical Exam  Constitutional: She is oriented to person, place, and time. She appears well-developed and well-nourished. No distress.  HENT:  Head: Normocephalic. Head is with contusion (left periorbital and left forehead). Head is without raccoon's eyes and without Battle's sign.  Right Ear: No  hemotympanum.  Left Ear: No hemotympanum.  Nose: No septal deviation or nasal septal hematoma.  Laceration 3cm  Eyes: Conjunctivae and EOM are normal.  Neck: Normal range of motion.  Cardiovascular: Normal rate, regular rhythm, normal heart sounds and intact distal pulses.  Exam reveals no gallop and no friction rub.   No murmur heard. Pulmonary/Chest: Effort normal and breath sounds normal. No respiratory distress. She has no wheezes.  She has no rales.  Abdominal: Soft. She exhibits no distension. There is no tenderness. There is no guarding.  Musculoskeletal: She exhibits no edema or tenderness.  Neurological: She is alert and oriented to person, place, and time.  Skin: Skin is warm and dry. No rash noted. She is not diaphoretic. No erythema.  Nursing note and vitals reviewed.    ED Treatments / Results  Labs (all labs ordered are listed, but only abnormal results are displayed) Labs Reviewed - No data to display  EKG  EKG Interpretation None       Radiology Ct Head Wo Contrast  Result Date: 06/19/2016 CLINICAL DATA:  Status post fall with large frontal hematoma. EXAM: CT HEAD WITHOUT CONTRAST CT CERVICAL SPINE WITHOUT CONTRAST TECHNIQUE: Multidetector CT imaging of the head and cervical spine was performed following the standard protocol without intravenous contrast. Multiplanar CT image reconstructions of the cervical spine were also generated. COMPARISON:  Head CT 07/24/2015 FINDINGS: CT HEAD FINDINGS Brain: No evidence of acute infarction, hemorrhage, hydrocephalus, extra-axial collection or mass lesion/mass effect. Microangiopathy and mild brain parenchymal volume loss noted. Vascular: Calcific atherosclerotic disease at the skullbase. Skull: Normal. Negative for fracture or focal lesion. Sinuses/Orbits: No acute finding. Stable right maxillary mucous retention cyst. Other: Large left frontal scalp hematoma. CT CERVICAL SPINE FINDINGS Alignment: Mild straightening of cervical  lordosis. Skull base and vertebrae: No acute fracture. No primary bone lesion or focal pathologic process. Prior anterior spinal fusion at the level of C5-C7. Soft tissues and spinal canal: No prevertebral fluid or swelling. No visible canal hematoma. Disc levels: Multilevel osteoarthritic changes of the cervical spine. Upper chest: Negative. Other: None. IMPRESSION: No evidence of acute intracranial injury. Mild brain parenchymal atrophy and microangiopathic changes. No evidence of acute traumatic injury of the cervical spine. Prior lower cervical spine fusion and multilevel osteoarthritic changes. Large left frontal scalp hematoma. Electronically Signed   By: Ted Mcalpineobrinka  Dimitrova M.D.   On: 06/19/2016 13:20   Ct Cervical Spine Wo Contrast  Result Date: 06/19/2016 CLINICAL DATA:  Status post fall with large frontal hematoma. EXAM: CT HEAD WITHOUT CONTRAST CT CERVICAL SPINE WITHOUT CONTRAST TECHNIQUE: Multidetector CT imaging of the head and cervical spine was performed following the standard protocol without intravenous contrast. Multiplanar CT image reconstructions of the cervical spine were also generated. COMPARISON:  Head CT 07/24/2015 FINDINGS: CT HEAD FINDINGS Brain: No evidence of acute infarction, hemorrhage, hydrocephalus, extra-axial collection or mass lesion/mass effect. Microangiopathy and mild brain parenchymal volume loss noted. Vascular: Calcific atherosclerotic disease at the skullbase. Skull: Normal. Negative for fracture or focal lesion. Sinuses/Orbits: No acute finding. Stable right maxillary mucous retention cyst. Other: Large left frontal scalp hematoma. CT CERVICAL SPINE FINDINGS Alignment: Mild straightening of cervical lordosis. Skull base and vertebrae: No acute fracture. No primary bone lesion or focal pathologic process. Prior anterior spinal fusion at the level of C5-C7. Soft tissues and spinal canal: No prevertebral fluid or swelling. No visible canal hematoma. Disc levels:  Multilevel osteoarthritic changes of the cervical spine. Upper chest: Negative. Other: None. IMPRESSION: No evidence of acute intracranial injury. Mild brain parenchymal atrophy and microangiopathic changes. No evidence of acute traumatic injury of the cervical spine. Prior lower cervical spine fusion and multilevel osteoarthritic changes. Large left frontal scalp hematoma. Electronically Signed   By: Ted Mcalpineobrinka  Dimitrova M.D.   On: 06/19/2016 13:20    Procedures .Marland Kitchen.Laceration Repair Date/Time: 06/19/2016 7:54 PM Performed by: Alvira MondaySCHLOSSMAN, Nattie Lazenby Authorized by: Alvira MondaySCHLOSSMAN, Chianti Goh   Consent:    Consent obtained:  Verbal   Consent given by:  Patient   Risks discussed:  Pain, infection and poor cosmetic result   Alternatives discussed:  No treatment Anesthesia (see MAR for exact dosages):    Anesthesia method:  None Laceration details:    Location:  Face   Length (cm):  3 Repair type:    Repair type:  Simple Pre-procedure details:    Preparation:  Patient was prepped and draped in usual sterile fashion and imaging obtained to evaluate for foreign bodies Treatment:    Area cleansed with:  Saline   Amount of cleaning:  Standard   Irrigation solution:  Sterile saline   Irrigation volume:  300 Skin repair:    Repair method:  Sutures and Steri-Strips   Suture size:  5-0   Suture material:  Fast-absorbing gut   Number of sutures:  5 Approximation:    Approximation:  Close   Vermilion border: well-aligned     (including critical care time)  Medications Ordered in ED Medications  lidocaine-EPINEPHrine (XYLOCAINE W/EPI) 2 %-1:200000 (PF) injection 20 mL (20 mLs Intradermal Given 06/19/16 1350)     Initial Impression / Assessment and Plan / ED Course  I have reviewed the triage vital signs and the nursing notes.  Pertinent labs & imaging results that were available during my care of the patient were reviewed by me and considered in my medical decision making (see chart for  details).  Clinical Course    82yo female with history of CAD, TIA, htn, hlpd, hyperthyroidism, essential tremor presents with concern for mechanical fall from standing with head laceration and contusion. The patient denies any other concerns or injuries, no LOC, otherwise in normal state of health.  CT head and cervical spine without scute abnormalities.    Laceration closed. Recommend close PCP follow up. Patient discharged in stable condition with understanding of reasons to return.    Final Clinical Impressions(s) / ED Diagnoses   Final diagnoses:  Fall, initial encounter  Contusion of other part of head, initial encounter  Facial laceration, initial encounter    New Prescriptions Discharge Medication List as of 06/19/2016  3:45 PM       Alvira MondayErin Tamarick Kovalcik, MD 06/19/16 2012

## 2016-07-14 ENCOUNTER — Other Ambulatory Visit: Payer: Self-pay | Admitting: Neurology

## 2016-10-05 DIAGNOSIS — H5213 Myopia, bilateral: Secondary | ICD-10-CM | POA: Diagnosis not present

## 2016-10-05 DIAGNOSIS — Z961 Presence of intraocular lens: Secondary | ICD-10-CM | POA: Diagnosis not present

## 2016-10-05 DIAGNOSIS — R350 Frequency of micturition: Secondary | ICD-10-CM | POA: Diagnosis not present

## 2016-10-05 DIAGNOSIS — N390 Urinary tract infection, site not specified: Secondary | ICD-10-CM | POA: Diagnosis not present

## 2016-10-05 DIAGNOSIS — H52203 Unspecified astigmatism, bilateral: Secondary | ICD-10-CM | POA: Diagnosis not present

## 2016-10-05 DIAGNOSIS — H524 Presbyopia: Secondary | ICD-10-CM | POA: Diagnosis not present

## 2016-10-14 DIAGNOSIS — N390 Urinary tract infection, site not specified: Secondary | ICD-10-CM | POA: Diagnosis not present

## 2016-10-27 ENCOUNTER — Other Ambulatory Visit: Payer: Self-pay | Admitting: *Deleted

## 2016-10-27 MED ORDER — BACLOFEN 20 MG PO TABS: ORAL_TABLET | ORAL | 1 refills | Status: DC

## 2016-11-02 ENCOUNTER — Ambulatory Visit (INDEPENDENT_AMBULATORY_CARE_PROVIDER_SITE_OTHER): Payer: Medicare Other | Admitting: Neurology

## 2016-11-02 ENCOUNTER — Encounter: Payer: Self-pay | Admitting: Neurology

## 2016-11-02 VITALS — BP 112/64 | HR 65 | Ht 59.0 in | Wt 154.5 lb

## 2016-11-02 DIAGNOSIS — G250 Essential tremor: Secondary | ICD-10-CM | POA: Diagnosis not present

## 2016-11-02 DIAGNOSIS — R269 Unspecified abnormalities of gait and mobility: Secondary | ICD-10-CM

## 2016-11-02 NOTE — Progress Notes (Signed)
Reason for visit: Essential tremor  Kimberly Bass is an 83 y.o. female  History of present illness:  Kimberly Bass is an 83 year old right-handed white female with a history of an essential tremor and a gait disorder. The patient has a history of myoclonic jerks of the legs that are still occurring with some frequency. The use of baclofen and gabapentin has helped the some. She is having some drowsiness on the medication, but she has better gait instability. She had one fall on 06/19/2016 when she was bending over to unplug a light, she fell over and hit a door. She underwent a CT scan of the brain and cervical spine that was unremarkable. The patient uses a walker when she is outside the house, she does not use an assistive device inside the house. She usually stays safe with ambulation. She denies any other significant medical issues that have come up since last seen.  Past Medical History:  Diagnosis Date  . Benign essential tremor   . Coronary artery disease   . Gait disorder   . H/O radioactive iodine thyroid ablation   . Hx of transient ischemic attack (TIA)   . Hyperlipidemia   . Hypertension   . Hyperthyroidism   . Lower extremity myoclonus   . Lumbosacral spondylosis   . Mild obesity   . Myoclonus    Lower extremity involvement  . Obesity   . Seizures (HCC)    History is questionable  . Thyroid disease     Past Surgical History:  Procedure Laterality Date  . ABDOMINAL HYSTERECTOMY    . APPENDECTOMY    . BREAST LUMPECTOMY    . CARDIAC CATHETERIZATION  01/03/2006  . hx back surgery    . myocardial infarction    . TONSILLECTOMY      Family History  Problem Relation Age of Onset  . Heart disease Father   . Heart attack Mother   . Stroke Sister   . Heart attack Sister   . Heart attack Brother     Possible  . Diabetes Sister     Social history:  reports that she has never smoked. She has never used smokeless tobacco. She reports that she does not  drink alcohol or use drugs.    Allergies  Allergen Reactions  . Codeine Nausea Only  . Meperidine Hcl Nausea Only  . Morphine Nausea Only  . Penicillins Nausea Only    Medications:  Prior to Admission medications   Medication Sig Start Date End Date Taking? Authorizing Provider  acetaminophen (TYLENOL ARTHRITIS PAIN) 650 MG CR tablet Take 650 mg by mouth every 8 (eight) hours as needed. Taking 2 tid    Yes [provider]  amLODipine (NORVASC) 5 MG tablet Take 5 mg by mouth daily.     Yes [provider]  aspirin 81 MG tablet Take 81 mg by mouth daily.     Yes [provider]  baclofen (LIORESAL) 20 MG tablet TAKE 1 AND 1/2 TABLETS BY  MOUTH IN THE MORNING AND IN THE EVENING AND 1 TABLET AT NOON 10/27/16  Yes York Spaniel, MD  Calcium Carbonate-Vitamin D (CALCIUM + D PO) Take by mouth 2 (two) times daily.     Yes [provider]  clopidogrel (PLAVIX) 75 MG tablet Take 1 tablet (75 mg total) by mouth daily. 04/07/16  Yes Nahser, Deloris Ping, MD  gabapentin (NEURONTIN) 400 MG capsule TAKE 1 CAPSULE BY MOUTH 4  TIMES DAILY Patient taking differently:  TAKE 1 CAPSULE BY MOUTH IN THE EVENING AND 1 CAPSULE AT BEDTIME 04/30/16  Yes York SpanielWillis, Karianne Nogueira K, MD  isosorbide mononitrate (IMDUR) 60 MG 24 hr tablet Take 1 tablet (60 mg total) by mouth daily. Please call and schedule a one year follow up appointment 03/31/16  Yes Nahser, Deloris PingPhilip J, MD  levothyroxine (SYNTHROID, LEVOTHROID) 75 MCG tablet Take 75 mcg by mouth daily.     Yes [provider]  metoprolol (LOPRESSOR) 100 MG tablet Take 1 tablet (100 mg total) by mouth 2 (two) times daily. 04/07/16  Yes Nahser, Deloris PingPhilip J, MD  mirabegron ER (MYRBETRIQ) 50 MG TB24 tablet Take 50 mg by mouth daily.    Yes [provider]  Multiple Vitamin (MULTIVITAMIN) tablet Take 1 tablet by mouth daily.     Yes [provider]  NITROSTAT 0.4 MG SL tablet DISSOLVE ONE TABLET UNDER THE TONGUE EVERY 5 MINUTES AS  NEEDED FOR CHEST PAIN.  DO NOT EXCEED A TOTAL OF 3 DOSES IN 15 MINUTES 10/11/12  Yes Nahser, Deloris PingPhilip J, MD  ranitidine (ZANTAC) 150 MG tablet Take 150 mg by mouth 2 (two) times daily.     Yes [provider]  simvastatin (ZOCOR) 20 MG tablet Take 20 mg by mouth at bedtime.     Yes [provider]  traZODone (DESYREL) 50 MG tablet Take 75 mg by mouth at bedtime. Takes one and one half tablet at bedtime.   Yes [provider]  vitamin B-12 (CYANOCOBALAMIN) 1000 MCG tablet Take 1,000 mcg by mouth daily.   Yes [provider]    ROS:  Out of a complete 14 system review of symptoms, the patient complains only of the following symptoms, and all other reviewed systems are negative.  Gait disorder Tremor  Blood pressure 112/64, pulse 65, height 4\' 11"  (1.499 m), weight 154 lb 8 oz (70.1 kg).  Physical Exam  General: The patient is alert and cooperative at the time of the examination.  Skin: No significant peripheral edema is noted.   Neurologic Exam  Mental status: The patient is alert and oriented x 3 at the time of the examination. The patient has apparent normal recent and remote memory, with an apparently normal attention span and concentration ability.   Cranial nerves: Facial symmetry is present. Speech is associated with a vocal tremor. Extraocular movements are full. Visual fields are full. A head and neck tremor is noted.  Motor: The patient has good strength in all 4 extremities.  Sensory examination: Soft touch sensation is symmetric on the face, arms, and legs.  Coordination: The patient has good finger-nose-finger and heel-to-shin bilaterally.  Gait and station: The patient is able to ambulate with a walker with good stability, good strides, good turns. Tandem gait was not attempted. Romberg is unsteady, the patient has a tendency to fall.  Reflexes: Deep tendon reflexes are symmetric.   CT head and cervical 06/19/16:  IMPRESSION: No  evidence of acute intracranial injury.  Mild brain parenchymal atrophy and microangiopathic changes.  No evidence of acute traumatic injury of the cervical spine.  Prior lower cervical spine fusion and multilevel osteoarthritic changes.  Large left frontal scalp hematoma.  * CT scan images were reviewed online. I agree with the written report.     Assessment/Plan:  1. Essential tremor  2. Gait disturbance  3. Myoclonus  The patient will be continued on her baclofen and gabapentin. She will follow-up in one year, sooner if needed. The patient appears to be remaining  relatively stable with her ability to ambulate.  Marlan Palau MD 11/02/2016 11:06 AM  [facility] [address] [place], [place] [address]  Phone [phone] Fax [phone]

## 2016-11-05 DIAGNOSIS — I2510 Atherosclerotic heart disease of native coronary artery without angina pectoris: Secondary | ICD-10-CM | POA: Diagnosis not present

## 2016-11-05 DIAGNOSIS — K219 Gastro-esophageal reflux disease without esophagitis: Secondary | ICD-10-CM | POA: Diagnosis not present

## 2016-11-05 DIAGNOSIS — I10 Essential (primary) hypertension: Secondary | ICD-10-CM | POA: Diagnosis not present

## 2017-02-15 ENCOUNTER — Other Ambulatory Visit: Payer: Self-pay | Admitting: Cardiovascular Disease

## 2017-02-15 ENCOUNTER — Other Ambulatory Visit: Payer: Self-pay | Admitting: Neurology

## 2017-04-26 ENCOUNTER — Encounter (HOSPITAL_COMMUNITY): Payer: Self-pay | Admitting: Emergency Medicine

## 2017-04-26 ENCOUNTER — Inpatient Hospital Stay (HOSPITAL_COMMUNITY)
Admission: EM | Admit: 2017-04-26 | Discharge: 2017-04-28 | DRG: 690 | Disposition: A | Discharge status: Home Health | Payer: Medicare Other | Attending: Family Medicine | Admitting: Family Medicine

## 2017-04-26 DIAGNOSIS — R404 Transient alteration of awareness: Secondary | ICD-10-CM | POA: Diagnosis not present

## 2017-04-26 DIAGNOSIS — Z880 Allergy status to penicillin: Secondary | ICD-10-CM

## 2017-04-26 DIAGNOSIS — M545 Low back pain: Secondary | ICD-10-CM | POA: Diagnosis not present

## 2017-04-26 DIAGNOSIS — Z885 Allergy status to narcotic agent status: Secondary | ICD-10-CM | POA: Diagnosis not present

## 2017-04-26 DIAGNOSIS — Z79890 Hormone replacement therapy (postmenopausal): Secondary | ICD-10-CM | POA: Diagnosis not present

## 2017-04-26 DIAGNOSIS — Z8739 Personal history of other diseases of the musculoskeletal system and connective tissue: Secondary | ICD-10-CM

## 2017-04-26 DIAGNOSIS — G250 Essential tremor: Secondary | ICD-10-CM | POA: Diagnosis present

## 2017-04-26 DIAGNOSIS — Z9181 History of falling: Secondary | ICD-10-CM

## 2017-04-26 DIAGNOSIS — G8929 Other chronic pain: Secondary | ICD-10-CM | POA: Diagnosis not present

## 2017-04-26 DIAGNOSIS — M47817 Spondylosis without myelopathy or radiculopathy, lumbosacral region: Secondary | ICD-10-CM | POA: Diagnosis not present

## 2017-04-26 DIAGNOSIS — E860 Dehydration: Secondary | ICD-10-CM | POA: Diagnosis present

## 2017-04-26 DIAGNOSIS — E785 Hyperlipidemia, unspecified: Secondary | ICD-10-CM | POA: Diagnosis not present

## 2017-04-26 DIAGNOSIS — Z833 Family history of diabetes mellitus: Secondary | ICD-10-CM

## 2017-04-26 DIAGNOSIS — N3001 Acute cystitis with hematuria: Secondary | ICD-10-CM | POA: Diagnosis not present

## 2017-04-26 DIAGNOSIS — I10 Essential (primary) hypertension: Secondary | ICD-10-CM | POA: Diagnosis present

## 2017-04-26 DIAGNOSIS — E669 Obesity, unspecified: Secondary | ICD-10-CM | POA: Diagnosis present

## 2017-04-26 DIAGNOSIS — N3091 Cystitis, unspecified with hematuria: Secondary | ICD-10-CM

## 2017-04-26 DIAGNOSIS — Z7982 Long term (current) use of aspirin: Secondary | ICD-10-CM | POA: Diagnosis not present

## 2017-04-26 DIAGNOSIS — Z6831 Body mass index (BMI) 31.0-31.9, adult: Secondary | ICD-10-CM

## 2017-04-26 DIAGNOSIS — R4020 Unspecified coma: Secondary | ICD-10-CM | POA: Diagnosis not present

## 2017-04-26 DIAGNOSIS — Z86010 Personal history of colonic polyps: Secondary | ICD-10-CM

## 2017-04-26 DIAGNOSIS — I959 Hypotension, unspecified: Secondary | ICD-10-CM | POA: Diagnosis present

## 2017-04-26 DIAGNOSIS — Z79899 Other long term (current) drug therapy: Secondary | ICD-10-CM

## 2017-04-26 DIAGNOSIS — R531 Weakness: Secondary | ICD-10-CM

## 2017-04-26 DIAGNOSIS — S0990XA Unspecified injury of head, initial encounter: Secondary | ICD-10-CM | POA: Diagnosis not present

## 2017-04-26 DIAGNOSIS — Z7902 Long term (current) use of antithrombotics/antiplatelets: Secondary | ICD-10-CM

## 2017-04-26 DIAGNOSIS — R319 Hematuria, unspecified: Secondary | ICD-10-CM

## 2017-04-26 DIAGNOSIS — I252 Old myocardial infarction: Secondary | ICD-10-CM

## 2017-04-26 DIAGNOSIS — Z8249 Family history of ischemic heart disease and other diseases of the circulatory system: Secondary | ICD-10-CM

## 2017-04-26 DIAGNOSIS — I2510 Atherosclerotic heart disease of native coronary artery without angina pectoris: Secondary | ICD-10-CM | POA: Diagnosis present

## 2017-04-26 DIAGNOSIS — Z823 Family history of stroke: Secondary | ICD-10-CM

## 2017-04-26 DIAGNOSIS — E890 Postprocedural hypothyroidism: Secondary | ICD-10-CM | POA: Diagnosis present

## 2017-04-26 DIAGNOSIS — N390 Urinary tract infection, site not specified: Secondary | ICD-10-CM | POA: Diagnosis present

## 2017-04-26 DIAGNOSIS — Z9049 Acquired absence of other specified parts of digestive tract: Secondary | ICD-10-CM

## 2017-04-26 DIAGNOSIS — Z8673 Personal history of transient ischemic attack (TIA), and cerebral infarction without residual deficits: Secondary | ICD-10-CM

## 2017-04-26 DIAGNOSIS — Z90710 Acquired absence of both cervix and uterus: Secondary | ICD-10-CM

## 2017-04-26 DIAGNOSIS — S299XXA Unspecified injury of thorax, initial encounter: Secondary | ICD-10-CM | POA: Diagnosis not present

## 2017-04-26 HISTORY — DX: Cystitis, unspecified with hematuria: N30.91

## 2017-04-26 HISTORY — DX: Hypothyroidism, unspecified: E03.9

## 2017-04-26 HISTORY — DX: Acute myocardial infarction, unspecified: I21.9

## 2017-04-26 HISTORY — DX: Migraine, unspecified, not intractable, without status migrainosus: G43.909

## 2017-04-26 HISTORY — DX: Pneumonia, unspecified organism: J18.9

## 2017-04-26 HISTORY — DX: Unspecified osteoarthritis, unspecified site: M19.90

## 2017-04-26 LAB — CBG MONITORING, ED: GLUCOSE-CAPILLARY: 113 mg/dL — AB (ref 65–99)

## 2017-04-27 ENCOUNTER — Emergency Department (HOSPITAL_COMMUNITY): Payer: Medicare Other

## 2017-04-27 ENCOUNTER — Encounter (HOSPITAL_COMMUNITY): Payer: Self-pay | Admitting: General Practice

## 2017-04-27 DIAGNOSIS — I252 Old myocardial infarction: Secondary | ICD-10-CM | POA: Diagnosis not present

## 2017-04-27 DIAGNOSIS — Z79890 Hormone replacement therapy (postmenopausal): Secondary | ICD-10-CM | POA: Diagnosis not present

## 2017-04-27 DIAGNOSIS — R531 Weakness: Secondary | ICD-10-CM

## 2017-04-27 DIAGNOSIS — Z8739 Personal history of other diseases of the musculoskeletal system and connective tissue: Secondary | ICD-10-CM

## 2017-04-27 DIAGNOSIS — R319 Hematuria, unspecified: Secondary | ICD-10-CM

## 2017-04-27 DIAGNOSIS — I2510 Atherosclerotic heart disease of native coronary artery without angina pectoris: Secondary | ICD-10-CM | POA: Diagnosis present

## 2017-04-27 DIAGNOSIS — Z86010 Personal history of colonic polyps: Secondary | ICD-10-CM | POA: Diagnosis not present

## 2017-04-27 DIAGNOSIS — Z9049 Acquired absence of other specified parts of digestive tract: Secondary | ICD-10-CM | POA: Diagnosis not present

## 2017-04-27 DIAGNOSIS — Z7902 Long term (current) use of antithrombotics/antiplatelets: Secondary | ICD-10-CM | POA: Diagnosis not present

## 2017-04-27 DIAGNOSIS — I10 Essential (primary) hypertension: Secondary | ICD-10-CM | POA: Diagnosis present

## 2017-04-27 DIAGNOSIS — Z6831 Body mass index (BMI) 31.0-31.9, adult: Secondary | ICD-10-CM | POA: Diagnosis not present

## 2017-04-27 DIAGNOSIS — E860 Dehydration: Secondary | ICD-10-CM | POA: Diagnosis not present

## 2017-04-27 DIAGNOSIS — E785 Hyperlipidemia, unspecified: Secondary | ICD-10-CM | POA: Diagnosis present

## 2017-04-27 DIAGNOSIS — G8929 Other chronic pain: Secondary | ICD-10-CM | POA: Diagnosis present

## 2017-04-27 DIAGNOSIS — I959 Hypotension, unspecified: Secondary | ICD-10-CM

## 2017-04-27 DIAGNOSIS — G250 Essential tremor: Secondary | ICD-10-CM | POA: Diagnosis not present

## 2017-04-27 DIAGNOSIS — E669 Obesity, unspecified: Secondary | ICD-10-CM | POA: Diagnosis present

## 2017-04-27 DIAGNOSIS — Z79899 Other long term (current) drug therapy: Secondary | ICD-10-CM | POA: Diagnosis not present

## 2017-04-27 DIAGNOSIS — N390 Urinary tract infection, site not specified: Secondary | ICD-10-CM | POA: Diagnosis present

## 2017-04-27 DIAGNOSIS — Z9181 History of falling: Secondary | ICD-10-CM | POA: Diagnosis not present

## 2017-04-27 DIAGNOSIS — M47817 Spondylosis without myelopathy or radiculopathy, lumbosacral region: Secondary | ICD-10-CM | POA: Diagnosis not present

## 2017-04-27 DIAGNOSIS — Z885 Allergy status to narcotic agent status: Secondary | ICD-10-CM | POA: Diagnosis not present

## 2017-04-27 DIAGNOSIS — Z90710 Acquired absence of both cervix and uterus: Secondary | ICD-10-CM | POA: Diagnosis not present

## 2017-04-27 DIAGNOSIS — R4020 Unspecified coma: Secondary | ICD-10-CM | POA: Diagnosis not present

## 2017-04-27 DIAGNOSIS — N3001 Acute cystitis with hematuria: Secondary | ICD-10-CM | POA: Diagnosis not present

## 2017-04-27 DIAGNOSIS — E890 Postprocedural hypothyroidism: Secondary | ICD-10-CM | POA: Diagnosis present

## 2017-04-27 DIAGNOSIS — Z880 Allergy status to penicillin: Secondary | ICD-10-CM | POA: Diagnosis not present

## 2017-04-27 DIAGNOSIS — S299XXA Unspecified injury of thorax, initial encounter: Secondary | ICD-10-CM | POA: Diagnosis not present

## 2017-04-27 DIAGNOSIS — Z7982 Long term (current) use of aspirin: Secondary | ICD-10-CM | POA: Diagnosis not present

## 2017-04-27 DIAGNOSIS — M545 Low back pain: Secondary | ICD-10-CM | POA: Diagnosis not present

## 2017-04-27 DIAGNOSIS — S0990XA Unspecified injury of head, initial encounter: Secondary | ICD-10-CM | POA: Diagnosis not present

## 2017-04-27 LAB — BASIC METABOLIC PANEL
ANION GAP: 9 (ref 5–15)
BUN: 13 mg/dL (ref 6–20)
CALCIUM: 9.2 mg/dL (ref 8.9–10.3)
CO2: 31 mmol/L (ref 22–32)
CREATININE: 0.79 mg/dL (ref 0.44–1.00)
Chloride: 100 mmol/L — ABNORMAL LOW (ref 101–111)
Glucose, Bld: 116 mg/dL — ABNORMAL HIGH (ref 65–99)
Potassium: 3.1 mmol/L — ABNORMAL LOW (ref 3.5–5.1)
SODIUM: 140 mmol/L (ref 135–145)

## 2017-04-27 LAB — URINALYSIS, ROUTINE W REFLEX MICROSCOPIC
Bilirubin Urine: NEGATIVE
Glucose, UA: NEGATIVE mg/dL
Ketones, ur: NEGATIVE mg/dL
Nitrite: NEGATIVE
PH: 5 (ref 5.0–8.0)
Protein, ur: 30 mg/dL — AB
SPECIFIC GRAVITY, URINE: 1.018 (ref 1.005–1.030)

## 2017-04-27 LAB — ETHANOL

## 2017-04-27 LAB — CBC
HCT: 41.8 % (ref 36.0–46.0)
HEMOGLOBIN: 13.6 g/dL (ref 12.0–15.0)
MCH: 28.6 pg (ref 26.0–34.0)
MCHC: 32.5 g/dL (ref 30.0–36.0)
MCV: 87.8 fL (ref 78.0–100.0)
PLATELETS: 140 10*3/uL — AB (ref 150–400)
RBC: 4.76 MIL/uL (ref 3.87–5.11)
RDW: 14.5 % (ref 11.5–15.5)
WBC: 7.2 10*3/uL (ref 4.0–10.5)

## 2017-04-27 LAB — RAPID URINE DRUG SCREEN, HOSP PERFORMED
AMPHETAMINES: POSITIVE — AB
Barbiturates: NOT DETECTED
Benzodiazepines: NOT DETECTED
Cocaine: NOT DETECTED
Opiates: NOT DETECTED
Tetrahydrocannabinol: NOT DETECTED

## 2017-04-27 LAB — PROTIME-INR
INR: 0.98
PROTHROMBIN TIME: 12.9 s (ref 11.4–15.2)

## 2017-04-27 LAB — I-STAT TROPONIN, ED: TROPONIN I, POC: 0.01 ng/mL (ref 0.00–0.08)

## 2017-04-27 LAB — I-STAT CG4 LACTIC ACID, ED: Lactic Acid, Venous: 1.01 mmol/L (ref 0.5–1.9)

## 2017-04-27 MED ORDER — SIMVASTATIN 20 MG PO TABS
20.0000 mg | ORAL_TABLET | Freq: Every day | ORAL | Status: DC
  Administered 2017-04-27: 20 mg via ORAL
  Filled 2017-04-27: qty 1

## 2017-04-27 MED ORDER — SODIUM CHLORIDE 0.9 % IV BOLUS (SEPSIS)
500.0000 mL | Freq: Once | INTRAVENOUS | Status: AC
  Administered 2017-04-27: 500 mL via INTRAVENOUS

## 2017-04-27 MED ORDER — BACLOFEN 20 MG PO TABS
30.0000 mg | ORAL_TABLET | Freq: Two times a day (BID) | ORAL | Status: DC
  Administered 2017-04-27 – 2017-04-28 (×3): 30 mg via ORAL
  Filled 2017-04-27 (×3): qty 1

## 2017-04-27 MED ORDER — DEXTROSE 5 % IV SOLN
1.0000 g | Freq: Every day | INTRAVENOUS | Status: DC
  Administered 2017-04-27 (×2): 1 g via INTRAVENOUS
  Filled 2017-04-27 (×3): qty 10

## 2017-04-27 MED ORDER — SODIUM CHLORIDE 0.9 % IV SOLN
INTRAVENOUS | Status: DC
  Administered 2017-04-27 (×2): via INTRAVENOUS

## 2017-04-27 MED ORDER — POTASSIUM CHLORIDE CRYS ER 20 MEQ PO TBCR
40.0000 meq | EXTENDED_RELEASE_TABLET | Freq: Once | ORAL | Status: AC
  Administered 2017-04-27: 40 meq via ORAL
  Filled 2017-04-27: qty 2

## 2017-04-27 MED ORDER — MIRABEGRON ER 50 MG PO TB24
50.0000 mg | ORAL_TABLET | Freq: Two times a day (BID) | ORAL | Status: DC
  Administered 2017-04-27 – 2017-04-28 (×3): 50 mg via ORAL
  Filled 2017-04-27 (×4): qty 1

## 2017-04-27 MED ORDER — GABAPENTIN 400 MG PO CAPS
400.0000 mg | ORAL_CAPSULE | Freq: Three times a day (TID) | ORAL | Status: DC
  Administered 2017-04-27 – 2017-04-28 (×6): 400 mg via ORAL
  Filled 2017-04-27 (×6): qty 1

## 2017-04-27 MED ORDER — ONDANSETRON HCL 4 MG/2ML IJ SOLN
4.0000 mg | Freq: Four times a day (QID) | INTRAMUSCULAR | Status: DC | PRN
Start: 2017-04-27 — End: 2017-04-28

## 2017-04-27 MED ORDER — BACLOFEN 20 MG PO TABS
20.0000 mg | ORAL_TABLET | Freq: Every day | ORAL | Status: DC
  Administered 2017-04-27 – 2017-04-28 (×3): 20 mg via ORAL
  Filled 2017-04-27 (×2): qty 1

## 2017-04-27 MED ORDER — FAMOTIDINE 20 MG PO TABS
20.0000 mg | ORAL_TABLET | Freq: Two times a day (BID) | ORAL | Status: DC
  Administered 2017-04-27 – 2017-04-28 (×3): 20 mg via ORAL
  Filled 2017-04-27 (×3): qty 1

## 2017-04-27 MED ORDER — ENOXAPARIN SODIUM 40 MG/0.4ML ~~LOC~~ SOLN
40.0000 mg | Freq: Every day | SUBCUTANEOUS | Status: DC
  Administered 2017-04-27 – 2017-04-28 (×2): 40 mg via SUBCUTANEOUS
  Filled 2017-04-27 (×2): qty 0.4

## 2017-04-27 MED ORDER — TRAZODONE HCL 50 MG PO TABS
75.0000 mg | ORAL_TABLET | Freq: Every day | ORAL | Status: DC
  Administered 2017-04-27: 75 mg via ORAL
  Filled 2017-04-27: qty 2

## 2017-04-27 MED ORDER — ADULT MULTIVITAMIN W/MINERALS CH
1.0000 | ORAL_TABLET | Freq: Every day | ORAL | Status: DC
  Administered 2017-04-27 – 2017-04-28 (×2): 1 via ORAL
  Filled 2017-04-27 (×2): qty 1

## 2017-04-27 MED ORDER — ASPIRIN EC 81 MG PO TBEC
81.0000 mg | DELAYED_RELEASE_TABLET | Freq: Every day | ORAL | Status: DC
  Administered 2017-04-27 – 2017-04-28 (×2): 81 mg via ORAL
  Filled 2017-04-27 (×2): qty 1

## 2017-04-27 MED ORDER — ACETAMINOPHEN 325 MG PO TABS
650.0000 mg | ORAL_TABLET | Freq: Two times a day (BID) | ORAL | Status: DC
  Administered 2017-04-27 – 2017-04-28 (×3): 650 mg via ORAL
  Filled 2017-04-27 (×3): qty 2

## 2017-04-27 MED ORDER — ONDANSETRON HCL 4 MG PO TABS: 4.0000 mg | ORAL_TABLET | Freq: Four times a day (QID) | ORAL | Status: DC | PRN

## 2017-04-27 MED ORDER — LEVOTHYROXINE SODIUM 75 MCG PO TABS
75.0000 ug | ORAL_TABLET | Freq: Every day | ORAL | Status: DC
  Administered 2017-04-27 – 2017-04-28 (×2): 75 ug via ORAL
  Filled 2017-04-27 (×2): qty 1

## 2017-04-27 MED ORDER — VITAMIN B-12 1000 MCG PO TABS
1000.0000 ug | ORAL_TABLET | Freq: Every day | ORAL | Status: DC
  Administered 2017-04-27 – 2017-04-28 (×2): 1000 ug via ORAL
  Filled 2017-04-27 (×2): qty 1

## 2017-04-27 MED ORDER — CIPROFLOXACIN IN D5W 400 MG/200ML IV SOLN
400.0000 mg | Freq: Once | INTRAVENOUS | Status: AC
  Administered 2017-04-27: 400 mg via INTRAVENOUS
  Filled 2017-04-27: qty 200

## 2017-04-27 MED ORDER — BACLOFEN 20 MG PO TABS: 20.0000 mg | ORAL_TABLET | Freq: Three times a day (TID) | ORAL | Status: DC

## 2017-04-27 MED ORDER — ACETAMINOPHEN 325 MG PO TABS: 650.0000 mg | ORAL_TABLET | Freq: Four times a day (QID) | ORAL | Status: DC | PRN

## 2017-04-27 MED ORDER — ACETAMINOPHEN 650 MG RE SUPP: 650.0000 mg | Freq: Four times a day (QID) | RECTAL | Status: DC | PRN

## 2017-04-27 MED ORDER — CLOPIDOGREL BISULFATE 75 MG PO TABS
75.0000 mg | ORAL_TABLET | Freq: Every day | ORAL | Status: DC
  Administered 2017-04-27 – 2017-04-28 (×2): 75 mg via ORAL
  Filled 2017-04-27 (×2): qty 1

## 2017-04-27 NOTE — ED Notes (Signed)
EDP at bedside  

## 2017-04-27 NOTE — H&P (Signed)
History and Physical    Kimberly Bass [place] ZOX:[id number] DOB: 10/21/1925 DOA: 04/26/2017  PCP: Merri BrunettePharr, Walter, MD  Patient coming from: ILF  I have personally briefly reviewed patient[facility]  Chief Complaint: generalized weakness  HPI: Kimberly Bass Krinke is a 83 y.o. female with medical history significant of HTN, benign essential tremor.  Patient presents to the ED with c/o generalized weakness.  Symptoms onset this evening while cooking dinner.  Felt generally weak.  No LOC nor seizures but had difficulty standing.  No trauma.  No HA, no CP.  No new meds.  EMS called.  BP noted to be low on standing (80s).   ED Course: UA shows UTI.  Given 500 NS bolus and cipro.  BPs running on low side (80s) when she falls asleep but low 100s systolic when awake.   Review of Systems: As per HPI otherwise 10 point review of systems negative.   Past Medical History:  Diagnosis Date  . Benign essential tremor   . Coronary artery disease   . Gait disorder   . Bass/O radioactive iodine thyroid ablation   . Hx of transient ischemic attack (TIA)   . Hyperlipidemia   . Hypertension   . Hyperthyroidism   . Lower extremity myoclonus   . Lumbosacral spondylosis   . Mild obesity   . Myoclonus    Lower extremity involvement  . Obesity   . Seizures (HCC)    History is questionable  . Thyroid disease     Past Surgical History:  Procedure Laterality Date  . ABDOMINAL HYSTERECTOMY    . APPENDECTOMY    . BREAST LUMPECTOMY    . CARDIAC CATHETERIZATION  01/03/2006  . hx back surgery    . myocardial infarction    . TONSILLECTOMY       reports that she has never smoked. She has never used smokeless tobacco. She reports that she does not drink alcohol or use drugs.  Allergies  Allergen Reactions  . Codeine Nausea Only  . Meperidine Hcl Nausea Only  . Morphine Nausea Only  . Penicillins Nausea Only    Family History  Problem Relation Age of Onset  .  Heart disease Father   . Heart attack Mother   . Stroke Sister   . Heart attack Sister   . Heart attack Brother        Possible  . Diabetes Sister      Prior to Admission medications   Medication Sig Start Date End Date Taking? Authorizing Provider  acetaminophen (TYLENOL ARTHRITIS PAIN) 650 MG CR tablet Take 650 mg by mouth every 8 (eight) hours as needed. Taking 2 tid     [provider]  amLODipine (NORVASC) 5 MG tablet Take 5 mg by mouth daily.      [provider]  aspirin 81 MG tablet Take 81 mg by mouth daily.      [provider]  baclofen (LIORESAL) 20 MG tablet TAKE 1 AND 1/2 TABLETS BY  MOUTH IN THE MORNING AND IN THE EVENING AND 1 TABLET AT NOON 10/27/16   York SpanielWillis, Charles K, MD  Calcium Carbonate-Vitamin D (CALCIUM + D PO) Take by mouth 2 (two) times daily.      [provider]  clopidogrel (PLAVIX) 75 MG tablet Take 1 tablet (75 mg total) by mouth daily. 04/07/16   Nahser, Deloris PingPhilip J, MD  gabapentin (NEURONTIN) 400 MG capsule TAKE 1 CAPSULE BY MOUTH 4  TIMES DAILY 02/16/17  York Spaniel, MD  isosorbide mononitrate (IMDUR) 60 MG 24 hr tablet TAKE 1 TABLET BY MOUTH  DAILY 02/16/17   Nahser, Deloris Ping, MD  levothyroxine (SYNTHROID, LEVOTHROID) 75 MCG tablet Take 75 mcg by mouth daily.      [provider]  metoprolol (LOPRESSOR) 100 MG tablet Take 1 tablet (100 mg total) by mouth 2 (two) times daily. 04/07/16   Nahser, Deloris Ping, MD  mirabegron ER (MYRBETRIQ) 50 MG TB24 tablet Take 50 mg by mouth daily.     [provider]  Multiple Vitamin (MULTIVITAMIN) tablet Take 1 tablet by mouth daily.      [provider]  NITROSTAT 0.4 MG SL tablet DISSOLVE ONE TABLET UNDER THE TONGUE EVERY 5 MINUTES AS NEEDED FOR CHEST PAIN.  DO NOT EXCEED A TOTAL OF 3 DOSES IN 15 MINUTES 10/11/12   Nahser, Deloris Ping, MD  ranitidine (ZANTAC) 150 MG tablet Take 150 mg by mouth 2 (two) times daily.      [provider]  simvastatin (ZOCOR)  20 MG tablet Take 20 mg by mouth at bedtime.      [provider]  traZODone (DESYREL) 50 MG tablet Take 75 mg by mouth at bedtime. Takes one and one half tablet at bedtime.    [provider]  vitamin B-12 (CYANOCOBALAMIN) 1000 MCG tablet Take 1,000 mcg by mouth daily.    [provider]    Physical Exam: Vitals:   04/27/17 0045 04/27/17 0115 04/27/17 0130 04/27/17 0200  BP: (!) 103/57 (!) 103/58 (!) 96/55 (!) 86/45  Pulse: (!) 57 62 (!) 58 68  Resp: 17 20 (!) 29 19  Temp:      TempSrc:      SpO2: 96% 95% 90% 90%  Weight:      Height:        Constitutional: NAD, calm, comfortable Eyes: PERRL, lids and conjunctivae normal ENMT: Mucous membranes are moist. Posterior pharynx clear of any exudate or lesions.Normal dentition.  Neck: normal, supple, no masses, no thyromegaly Respiratory: clear to auscultation bilaterally, no wheezing, no crackles. Normal respiratory effort. No accessory muscle use.  Cardiovascular: Regular rate and rhythm, no murmurs / rubs / gallops. No extremity edema. 2+ pedal pulses. No carotid bruits.  Abdomen: no tenderness, no masses palpated. No hepatosplenomegaly. Bowel sounds positive.  Musculoskeletal: no clubbing / cyanosis. No joint deformity upper and lower extremities. Good ROM, no contractures. Normal muscle tone.  Skin: no rashes, lesions, ulcers. No induration Neurologic: CN 2-12 grossly intact. Sensation intact, DTR normal. Strength 5/5 in all 4.  Psychiatric: Normal judgment and insight. Alert and oriented x 3. Normal mood.    Labs on Admission: I have personally reviewed following labs and imaging studies  CBC:  Recent Labs Lab 04/26/17 2344  WBC 7.2  HGB 13.6  HCT 41.8  MCV 87.8  PLT 140*   Basic Metabolic Panel:  Recent Labs Lab 04/26/17 2344  NA 140  K 3.1*  CL 100*  CO2 31  GLUCOSE 116*  BUN 13  CREATININE 0.79  CALCIUM 9.2   GFR: Estimated Creatinine Clearance: 43.8 mL/min (by C-G formula based  on SCr of 0.79 mg/dL). Liver Function Tests: No results for input(s): AST, ALT, ALKPHOS, BILITOT, PROT, ALBUMIN in the last 168 hours. No results for input(s): LIPASE, AMYLASE in the last 168 hours. No results for input(s): AMMONIA in the last 168 hours. Coagulation Profile:  Recent Labs Lab 04/27/17 0001  INR 0.98   Cardiac Enzymes: No results  for input(s): CKTOTAL, CKMB, CKMBINDEX, TROPONINI in the last 168 hours. BNP (last 3 results) No results for input(s): PROBNP in the last 8760 hours. HbA1C: No results for input(s): HGBA1C in the last 72 hours. CBG:  Recent Labs Lab 04/26/17 2341  GLUCAP 113*   Lipid Profile: No results for input(s): CHOL, HDL, LDLCALC, TRIG, CHOLHDL, LDLDIRECT in the last 72 hours. Thyroid Function Tests: No results for input(s): TSH, T4TOTAL, FREET4, T3FREE, THYROIDAB in the last 72 hours. Anemia Panel: No results for input(s): VITAMINB12, FOLATE, FERRITIN, TIBC, IRON, RETICCTPCT in the last 72 hours. Urine analysis:    Component Value Date/Time   COLORURINE YELLOW 04/27/2017 0019   APPEARANCEUR CLOUDY (A) 04/27/2017 0019   LABSPEC 1.018 04/27/2017 0019   PHURINE 5.0 04/27/2017 0019   GLUCOSEU NEGATIVE 04/27/2017 0019   HGBUR MODERATE (A) 04/27/2017 0019   BILIRUBINUR NEGATIVE 04/27/2017 0019   KETONESUR NEGATIVE 04/27/2017 0019   PROTEINUR 30 (A) 04/27/2017 0019   NITRITE NEGATIVE 04/27/2017 0019   LEUKOCYTESUR LARGE (A) 04/27/2017 0019    Radiological Exams on Admission: Ct Head Wo Contrast  Result Date: 04/27/2017 CLINICAL DATA:  Altered level of consciousness. Patient also reports fall today. EXAM: CT HEAD WITHOUT CONTRAST TECHNIQUE: Contiguous axial images were obtained from the base of the skull through the vertex without intravenous contrast. COMPARISON:  CT 06/19/2016 FINDINGS: Brain: Mild atrophy, normal for age. Mild chronic small vessel ischemia is similar. Chronic small focal encephalomalacia in the left parietal lobe. No  intracranial hemorrhage, mass effect, or midline shift. No hydrocephalus. The basilar cisterns are patent. No evidence of territorial infarct or acute ischemia. No extra-axial or intracranial fluid collection. Vascular: Atherosclerosis of skullbase vasculature without hyperdense vessel or abnormal calcification. Skull: No fracture or focal lesion. Sinuses/Orbits: Unchanged right maxillary sinus mucous retention cyst. No sinus fluid level or inflammation. Bilateral cataract resection. Mastoid air cells are clear. Other: None. IMPRESSION: No acute intracranial abnormality. Electronically Signed   By: Rubye Oaks M.D.   On: 04/27/2017 00:41   Dg Chest Port 1 View  Result Date: 04/27/2017 CLINICAL DATA:  Weakness.  Fall today. EXAM: PORTABLE CHEST 1 VIEW COMPARISON:  Radiograph 07/24/2015.  Chest CT 05/21/2015 FINDINGS: Unchanged heart size and mediastinal contours with mild cardiomegaly. Atherosclerosis of these thoracic aorta. Streaky bibasilar atelectasis. No consolidation, pulmonary edema, large pleural effusion or pneumothorax. Surgical hardware in the lower cervical spine is partially included. IMPRESSION: Streaky bibasilar atelectasis. Unchanged cardiomegaly and aortic atherosclerosis. Electronically Signed   By: Rubye Oaks M.D.   On: 04/27/2017 00:45    EKG: Independently reviewed.  Assessment/Plan Principal Problem:   UTI (urinary tract infection) Active Problems:   Hx of low back pain   Tremor, essential   Hypotension    1. UTI - 1. UCx pending 2. Rocephin 2. Borderline Hypotension 1. Hold BP meds 2. IVF: 500 cc bolus and NS at 125 cc/hr 3. Bass/o low back pain - Continue neurontin 4. HLD - continue statin 5. Tremor - continue home meds  DVT prophylaxis: Lovenox Code Status: Full Family Communication: No family in room Disposition Plan: ILF after admit Consults called: None Admission status: Place in obs   GARDNER, JARED M. DO [facility] Pager  [phone]  If 7AM-7PM, please contact day team taking care of patient [email or web address] Password [email or web address]  04/27/2017, 2:15 AM

## 2017-04-27 NOTE — Progress Notes (Signed)
Pt admitted from ED per stretcher accompanied by a nurse tech, on arrival to the floor pt fully alert and oriented, self introduced to pt, ID bracelet varrified with pt, oriented to pt care equipment,  Vital signs are stable, no skin issues, fall assessment done   prescribed treatment given, given her food ate with no problem will continue to monitor

## 2017-04-27 NOTE — Progress Notes (Addendum)
Patient seen and examined at bedside, patient admitted after midnight, please see earlier detailed admission note by Hillary BowJared M Gardner, DO. Briefly, patient presented with weakness.  She is found to be hypotensive prior to arrival. Evidence of possible urinary tract infection. PT and OT consulted this AM. Telemetry.   Kimberly Hawkingalph Pharoah Goggins, MD [facility] 04/27/2017, 1:02 PM Pager: [phone]

## 2017-04-27 NOTE — ED Provider Notes (Signed)
[facility]   CSN: [id number] Arrival date & time: 04/26/17  2331     History   Chief Complaint Chief Complaint  Patient presents with  . Weakness    HPI Kimberly Bass is a 83 y.o. female.  The history is provided by the patient.  Weakness  This is a new problem. Episode onset: just prior to arrival. There was no focality noted. There has been no fever. Pertinent negatives include no chest pain, no vomiting and no headaches.  pt presents from independent living for feeling weak She reports standing in the kitchen when she felt weak "all over" and went to the floor and had "a fit" She denies LOC or seizures but had difficulty standing No traumatic injury She now feels tired and weak No HA No CP No new meds She had otherwise been well No recent vomiting/diarrhea Per EMS pt had low BP while standing (80s) Past Medical History:  Diagnosis Date  . Benign essential tremor   . Coronary artery disease   . Gait disorder   . H/O radioactive iodine thyroid ablation   . Hx of transient ischemic attack (TIA)   . Hyperlipidemia   . Hypertension   . Hyperthyroidism   . Lower extremity myoclonus   . Lumbosacral spondylosis   . Mild obesity   . Myoclonus    Lower extremity involvement  . Obesity   . Seizures (HCC)    History is questionable  . Thyroid disease     Patient Active Problem List   Diagnosis Date Noted  . Tremor, essential 11/03/2015  . 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 Abnormality of gait 08/15/2012  . Essential and other specified forms of tremor 08/15/2012  . Degeneration of lumbar or lumbosacral intervertebral disc 08/15/2012  . Hx of low back pain 08/15/2012  . Pain of left leg 08/15/2012  . CAD (coronary artery disease) 12/23/2010  . Palpitations 12/23/2010  . Hx of transient ischemic attack (TIA)   . CONSTIPATION 02/27/2009  . PERSONAL HX COLONIC POLYPS 02/27/2009    Past Surgical History:    Procedure Laterality Date  . ABDOMINAL HYSTERECTOMY    . APPENDECTOMY    . BREAST LUMPECTOMY    . CARDIAC CATHETERIZATION  01/03/2006  . hx back surgery    . myocardial infarction    . TONSILLECTOMY      OB History    No data available       Home Medications    Prior to Admission medications   Medication Sig Start Date End Date Taking? Authorizing Provider  acetaminophen (TYLENOL ARTHRITIS PAIN) 650 MG CR tablet Take 650 mg by mouth every 8 (eight) hours as needed. Taking 2 tid     [provider]  amLODipine (NORVASC) 5 MG tablet Take 5 mg by mouth daily.      [provider]  aspirin 81 MG tablet Take 81 mg by mouth daily.      [provider]  baclofen (LIORESAL) 20 MG tablet TAKE 1 AND 1/2 TABLETS BY  MOUTH IN THE MORNING AND IN THE EVENING AND 1 TABLET AT NOON 10/27/16   York SpanielWillis, Charles K, MD  Calcium Carbonate-Vitamin D (CALCIUM + D PO) Take by mouth 2 (two) times daily.      [provider]  clopidogrel (PLAVIX) 75 MG tablet Take 1 tablet (75 mg total) by mouth daily. 04/07/16   Nahser, Deloris PingPhilip J, MD  gabapentin (NEURONTIN) 400 MG capsule TAKE 1 CAPSULE BY  MOUTH 4  TIMES DAILY 02/16/17   York Spaniel, MD  isosorbide mononitrate (IMDUR) 60 MG 24 hr tablet TAKE 1 TABLET BY MOUTH  DAILY 02/16/17   Nahser, Deloris Ping, MD  levothyroxine (SYNTHROID, LEVOTHROID) 75 MCG tablet Take 75 mcg by mouth daily.      [provider]  metoprolol (LOPRESSOR) 100 MG tablet Take 1 tablet (100 mg total) by mouth 2 (two) times daily. 04/07/16   Nahser, Deloris Ping, MD  mirabegron ER (MYRBETRIQ) 50 MG TB24 tablet Take 50 mg by mouth daily.     [provider]  Multiple Vitamin (MULTIVITAMIN) tablet Take 1 tablet by mouth daily.      [provider]  NITROSTAT 0.4 MG SL tablet DISSOLVE ONE TABLET UNDER THE TONGUE EVERY 5 MINUTES AS NEEDED FOR CHEST PAIN.  DO NOT EXCEED A TOTAL OF 3 DOSES IN 15 MINUTES 10/11/12   Nahser, Deloris Ping, MD   ranitidine (ZANTAC) 150 MG tablet Take 150 mg by mouth 2 (two) times daily.      [provider]  simvastatin (ZOCOR) 20 MG tablet Take 20 mg by mouth at bedtime.      [provider]  traZODone (DESYREL) 50 MG tablet Take 75 mg by mouth at bedtime. Takes one and one half tablet at bedtime.    [provider]  vitamin B-12 (CYANOCOBALAMIN) 1000 MCG tablet Take 1,000 mcg by mouth daily.    [provider]    Family History Family History  Problem Relation Age of Onset  . Heart disease Father   . Heart attack Mother   . Stroke Sister   . Heart attack Sister   . Heart attack Brother        Possible  . Diabetes Sister     Social History Social History  Substance Use Topics  . Smoking status: Never Smoker  . Smokeless tobacco: Never Used  . Alcohol use No     Allergies   Codeine; Meperidine hcl; Morphine; and Penicillins   Review of Systems Review of Systems  Constitutional: Positive for fatigue. Negative for fever.  Cardiovascular: Negative for chest pain.  Gastrointestinal: Negative for vomiting.  Neurological: Positive for weakness. Negative for headaches.  All other systems reviewed and are negative.    Physical Exam Updated Vital Signs BP 99/61   Pulse (!) 59   Temp (!) 97.4 F (36.3 C)   Resp 15   Ht 1.575 m (5\' 2" )   Wt 70.3 kg (155 lb)   SpO2 96%   BMI 28.35 kg/m   Physical Exam CONSTITUTIONAL: elderly, drowsy but no acute distress HEAD: Normocephalic/atraumatic, no signs of trauma EYES: EOMI/PERRL ENMT: Mucous membranes dry NECK: supple no meningeal signs SPINE/BACK:entire spine nontender, No bruising/crepitance/stepoffs noted to spine CV: S1/S2 noted, no murmurs/rubs/gallops noted LUNGS: Lungs are clear to auscultation bilaterally, no apparent distress ABDOMEN: soft, nontender, no rebound or guarding, bowel sounds noted throughout abdomen GU:no cva tenderness NEURO: Pt is drowsy but arousable/slow to respond  but answers questions appropriately.  No dysarthria awake/alert/appropriate, moves all extremitiesx4.  No facial droop.   EXTREMITIES: pulses normal/equal, full ROM, All other extremities/joints palpated/ranged and nontender SKIN: warm, color normal PSYCH: no abnormalities of mood noted, alert and oriented to situation   ED Treatments / Results  Labs (all labs ordered are listed, but only abnormal results are displayed) Labs Reviewed  BASIC METABOLIC PANEL - Abnormal; Notable for the following:       Result Value   Potassium  3.1 (*)    Chloride 100 (*)    Glucose, Bld 116 (*)    All other components within normal limits  CBC - Abnormal; Notable for the following:    Platelets 140 (*)    All other components within normal limits  URINALYSIS, ROUTINE W REFLEX MICROSCOPIC - Abnormal; Notable for the following:    APPearance CLOUDY (*)    Hgb urine dipstick MODERATE (*)    Protein, ur 30 (*)    Leukocytes, UA LARGE (*)    Bacteria, UA RARE (*)    Squamous Epithelial / LPF 0-5 (*)    All other components within normal limits  RAPID URINE DRUG SCREEN, HOSP PERFORMED - Abnormal; Notable for the following:    Amphetamines POSITIVE (*)    All other components within normal limits  CBG MONITORING, ED - Abnormal; Notable for the following:    Glucose-Capillary 113 (*)    All other components within normal limits  URINE CULTURE  ETHANOL  PROTIME-INR  CBG MONITORING, ED  I-STAT TROPONIN, ED  I-STAT CG4 LACTIC ACID, ED    EKG  EKG Interpretation  Date/Time:  Tuesday April 26 2017 23:42:48 EDT Ventricular Rate:  59 PR Interval:    QRS Duration: 109 QT Interval:  476 QTC Calculation: 472 R Axis:   -49 Text Interpretation:  Sinus rhythm Short PR interval LAD, consider left anterior fascicular block Low voltage, precordial leads Borderline T abnormalities, inferior leads Baseline wander in lead(s) I III aVR aVL aVF V2 V3 V4 V5 V6 Interpretation limited secondary to artifact No  significant change since last tracing Confirmed by Zadie Rhine ([id number]) on 04/27/2017 12:08:32 AM       Radiology Ct Head Wo Contrast  Result Date: 04/27/2017 CLINICAL DATA:  Altered level of consciousness. Patient also reports fall today. EXAM: CT HEAD WITHOUT CONTRAST TECHNIQUE: Contiguous axial images were obtained from the base of the skull through the vertex without intravenous contrast. COMPARISON:  CT 06/19/2016 FINDINGS: Brain: Mild atrophy, normal for age. Mild chronic small vessel ischemia is similar. Chronic small focal encephalomalacia in the left parietal lobe. No intracranial hemorrhage, mass effect, or midline shift. No hydrocephalus. The basilar cisterns are patent. No evidence of territorial infarct or acute ischemia. No extra-axial or intracranial fluid collection. Vascular: Atherosclerosis of skullbase vasculature without hyperdense vessel or abnormal calcification. Skull: No fracture or focal lesion. Sinuses/Orbits: Unchanged right maxillary sinus mucous retention cyst. No sinus fluid level or inflammation. Bilateral cataract resection. Mastoid air cells are clear. Other: None. IMPRESSION: No acute intracranial abnormality. Electronically Signed   By: Rubye Oaks M.D.   On: 04/27/2017 00:41   Dg Chest Port 1 View  Result Date: 04/27/2017 CLINICAL DATA:  Weakness.  Fall today. EXAM: PORTABLE CHEST 1 VIEW COMPARISON:  Radiograph 07/24/2015.  Chest CT 05/21/2015 FINDINGS: Unchanged heart size and mediastinal contours with mild cardiomegaly. Atherosclerosis of these thoracic aorta. Streaky bibasilar atelectasis. No consolidation, pulmonary edema, large pleural effusion or pneumothorax. Surgical hardware in the lower cervical spine is partially included. IMPRESSION: Streaky bibasilar atelectasis. Unchanged cardiomegaly and aortic atherosclerosis. Electronically Signed   By: Rubye Oaks M.D.   On: 04/27/2017 00:45    Procedures Procedures (including critical care  time)  Medications Ordered in ED Medications  ciprofloxacin (CIPRO) IVPB 400 mg (not administered)     Initial Impression / Assessment and Plan / ED Course  I have reviewed the triage vital signs and the nursing notes.  Pertinent labs & imaging results that were available during  my care of the patient were reviewed by me and considered in my medical decision making (see chart for details).     Pt with ?syncopal episode, now very sleepy but no signs of stroke Pt found to have UTI Will admit I called her son at her request [phone]) but no response 2:23 AM D/w dr gardner for admission BP drifts down when asleep, then improves when awake Lactate normal Doubt sepsis Will admit Pt denies complaints   Final Clinical Impressions(s) / ED Diagnoses   Final diagnoses:  Acute cystitis with hematuria  Weakness  Dehydration    New Prescriptions New Prescriptions   No medications on file     Zadie Rhine, MD 04/27/17 [phone]

## 2017-04-27 NOTE — Evaluation (Signed)
Physical Therapy Evaluation Patient Details Name: Kimberly Bass MRN: [id number] DOB: 04/30/1926 Today's Date: 04/27/2017   History of Present Illness  Pt is an 83 y/o female admitted secondary to generalized weakness and found to have a UTI. PMH including but not limited to HTN, CAD and essential tremor.  Clinical Impression  Pt presented sitting OOB in recliner chair, awake and willing to participate in therapy session. Prior to admission, pt reported that she ambulated with use of rollator and was independent with ADLs. Pt lives in an independent living facility in her own apartment. Pt ambulated in hallway with use of RW and supervision for safety. Pt would continue to benefit from skilled physical therapy services at this time while admitted and after d/c to address the below listed limitations in order to improve overall safety and independence with functional mobility.      Follow Up Recommendations Home health PT    Equipment Recommendations  None recommended by PT    Recommendations for Other Services       Precautions / Restrictions Precautions Precautions: Fall Restrictions Weight Bearing Restrictions: No      Mobility  Bed Mobility               General bed mobility comments: pt OOB in recliner  Transfers Overall transfer level: Needs assistance Equipment used: Rolling walker (2 wheeled) Transfers: Sit to/from Stand Sit to Stand: Supervision         General transfer comment: for safety  Ambulation/Gait Ambulation/Gait assistance: Supervision Ambulation Distance (Feet): 100 Feet Assistive device: Rolling walker (2 wheeled) Gait Pattern/deviations: Step-through pattern;Decreased stride length;Trunk flexed Gait velocity: decreased Gait velocity interpretation: at or above normal speed for age/gender General Gait Details: pt mildly unsteady but no overt LOB or need for physical assistance  Stairs            Wheelchair Mobility     Modified Rankin (Stroke Patients Only)       Balance Overall balance assessment: Needs assistance Sitting-balance support: Feet supported Sitting balance-Leahy Scale: Good     Standing balance support: During functional activity;Bilateral upper extremity supported Standing balance-Leahy Scale: Poor                               Pertinent Vitals/Pain Pain Assessment: No/denies pain    Home Living Family/patient expects to be discharged to:: Private residence Living Arrangements: Alone Available Help at Discharge: Neighbor;Available 24 hours/day Type of Home: Independent living facility Home Access: Elevator     Home Layout: One level Home Equipment: Walker - 4 wheels;Grab bars - tub/shower      Prior Function Level of Independence: Independent with assistive device(s)         Comments: ambulates with use of rollator when outside of her apartment; inside her apartment pt "furniture surfs:     Hand Dominance        Extremity/Trunk Assessment   Upper Extremity Assessment Upper Extremity Assessment: Generalized weakness    Lower Extremity Assessment Lower Extremity Assessment: Generalized weakness       Communication   Communication: No difficulties  Cognition Arousal/Alertness: Awake/alert Behavior During Therapy: WFL for tasks assessed/performed Overall Cognitive Status: Impaired/Different from baseline Area of Impairment: Problem solving;Orientation                 Orientation Level: Disoriented to;Place           Problem Solving: Slow processing General Comments: very slow to  respond      General Comments      Exercises     Assessment/Plan    PT Assessment Patient needs continued PT services  PT Problem List Decreased strength;Decreased balance;Decreased mobility;Decreased coordination;Decreased safety awareness;Decreased knowledge of use of DME       PT Treatment Interventions DME instruction;Gait  training;Stair training;Functional mobility training;Therapeutic activities;Balance training;Therapeutic exercise;Neuromuscular re-education;Patient/family education    PT Goals (Current goals can be found in the Care Plan section)  Acute Rehab PT Goals Patient Stated Goal: return home PT Goal Formulation: With patient Time For Goal Achievement: 05/11/17 Potential to Achieve Goals: Good    Frequency Min 3X/week   Barriers to discharge        Co-evaluation               AM-PAC PT "6 Clicks" Daily Activity  Outcome Measure Difficulty turning over in bed (including adjusting bedclothes, sheets and blankets)?: A Little Difficulty moving from lying on back to sitting on the side of the bed? : A Little Difficulty sitting down on and standing up from a chair with arms (e.g., wheelchair, bedside commode, etc,.)?: A Little Help needed moving to and from a bed to chair (including a wheelchair)?: A Little Help needed walking in hospital room?: A Little Help needed climbing 3-5 steps with a railing? : A Little 6 Click Score: 18    End of Session   Activity Tolerance: Patient tolerated treatment well Patient left: in chair;with call bell/phone within reach;with chair alarm set Nurse Communication: Mobility status PT Visit Diagnosis: Unsteadiness on feet (R26.81);Other abnormalities of gait and mobility (R26.89)    Time: 6962-9528 PT Time Calculation (min) (ACUTE ONLY): 26 min   Charges:   PT Evaluation $PT Eval Moderate Complexity: 1 Mod PT Treatments $Gait Training: 8-22 mins   PT G Codes:   PT G-Codes **NOT FOR INPATIENT CLASS** Functional Assessment Tool Used: AM-PAC 6 Clicks Basic Mobility;Clinical judgement Functional Limitation: Mobility: Walking and moving around Mobility: Walking and Moving Around Current Status (U1324): At least 40 percent but less than 60 percent impaired, limited or restricted Mobility: Walking and Moving Around Goal Status [phone]): At least 1  percent but less than 20 percent impaired, limited or restricted    [facility], [place], DPT [phone]   Kimberly Bass 04/27/2017, 6:05 PM

## 2017-04-28 DIAGNOSIS — G250 Essential tremor: Secondary | ICD-10-CM

## 2017-04-28 DIAGNOSIS — N3001 Acute cystitis with hematuria: Principal | ICD-10-CM

## 2017-04-28 DIAGNOSIS — E860 Dehydration: Secondary | ICD-10-CM

## 2017-04-28 LAB — BASIC METABOLIC PANEL
ANION GAP: 9 (ref 5–15)
BUN: 6 mg/dL (ref 6–20)
CALCIUM: 8.8 mg/dL — AB (ref 8.9–10.3)
CO2: 28 mmol/L (ref 22–32)
CREATININE: 0.59 mg/dL (ref 0.44–1.00)
Chloride: 105 mmol/L (ref 101–111)
GFR calc Af Amer: >60 mL/min (ref >60)
GLUCOSE: 107 mg/dL — AB (ref 65–99)
Potassium: 3.4 mmol/L — ABNORMAL LOW (ref 3.5–5.1)
Sodium: 142 mmol/L (ref 135–145)

## 2017-04-28 LAB — MAGNESIUM: Magnesium: 1.8 mg/dL (ref 1.7–2.4)

## 2017-04-28 LAB — TSH: TSH: 1.4 u[IU]/mL (ref 0.350–4.500)

## 2017-04-28 MED ORDER — CEFPODOXIME PROXETIL 200 MG PO TABS: 200.0000 mg | ORAL_TABLET | Freq: Two times a day (BID) | ORAL | 0 refills | Status: AC

## 2017-04-28 MED ORDER — SODIUM CHLORIDE 0.9 % IV BOLUS (SEPSIS)
500.0000 mL | Freq: Once | INTRAVENOUS | Status: AC
  Administered 2017-04-28: 500 mL via INTRAVENOUS

## 2017-04-28 MED ORDER — GABAPENTIN 300 MG PO CAPS: 300.0000 mg | ORAL_CAPSULE | Freq: Three times a day (TID) | ORAL | 0 refills | Status: DC

## 2017-04-28 MED ORDER — POTASSIUM CHLORIDE CRYS ER 20 MEQ PO TBCR
40.0000 meq | EXTENDED_RELEASE_TABLET | Freq: Once | ORAL | Status: DC
Start: 2017-04-28 — End: 2017-04-28

## 2017-04-28 NOTE — Progress Notes (Signed)
Pearlean BrownieJeanneane H Sistrunk to be D/C'd Home with home health per MD order.  Discussed with the patient and caregiver and all questions fully answered.  VSS, Skin clean, dry and intact without evidence of skin break down, no evidence of skin tears noted. IV catheter discontinued intact. Site without signs and symptoms of complications. Dressing and pressure applied.  An After Visit Summary was printed and given to the patient and caregiver. Caregiver received prescription for VANTIN.  D/c education completed with patient/family including follow up instructions, medication list, d/c activities limitations if indicated, with other d/c instructions as indicated by MD - patient and caregiver able to verbalize understanding, all questions fully answered.   Patient instructed to return to ED, call 911, or call MD for any changes in condition.   Patient escorted via WC, and D/C home via private auto.  Misty StanleySheila  Carvin Almas 04/28/2017 4:42 PM

## 2017-04-28 NOTE — Progress Notes (Signed)
   04/28/17 1526 04/28/17 1528 04/28/17 1530  Vitals  BP (!) 159/70 (!) 143/74 (!) 156/77  BP Location --  Left Arm Left Arm  BP Method Automatic Automatic Automatic  Patient Position (if appropriate) Lying Sitting Standing  Pulse Rate 95 --  (!) 102  Pulse Rate Source Monitor Monitor Monitor  ECG Heart Rate --  97 --   Resp 18 18 18   Orthostatic Lying   BP- Lying 159/70 --  --   Pulse- Lying 95 --  --   Orthostatic Sitting  BP- Sitting --  143/74 --   Pulse- Sitting --  97 --   Orthostatic Standing at 0 minutes  BP- Standing at 0 minutes --  --  156/77  Pulse- Standing at 0 minutes --  --  102  Pt without any complaints during orthostatic blood pressure checks.

## 2017-04-28 NOTE — Evaluation (Signed)
Occupational Therapy Evaluation Patient Details Name: Kimberly Bass MRN: [id number] DOB: 02-17-1926 Today's Date: 04/28/2017    History of Present Illness Pt is an 83 y/o female admitted secondary to generalized weakness and found to have a UTI. PMH including but not limited to HTN, CAD and essential tremor.   Clinical Impression   PTA, pt was living at ILF and performed her ADLs and light IADLs. Pt currently performing ADLs and functional mobility at supervision-Min Guard level. Recommend dc home once medically stable per physician. All my acute OT needs met and education provided in preparation for dc.     Follow Up Recommendations  No OT follow up;Supervision - Intermittent    Equipment Recommendations  None recommended by OT    Recommendations for Other Services PT consult     Precautions / Restrictions Precautions Precautions: Fall Restrictions Weight Bearing Restrictions: No      Mobility Bed Mobility               General bed mobility comments: Pt at EOB upon arrival  Transfers Overall transfer level: Needs assistance Equipment used: Rolling walker (2 wheeled) Transfers: Sit to/from Stand Sit to Stand: Supervision         General transfer comment: for safety    Balance Overall balance assessment: Needs assistance Sitting-balance support: Feet supported Sitting balance-Leahy Scale: Good     Standing balance support: During functional activity;Bilateral upper extremity supported Standing balance-Leahy Scale: Poor                             ADL either performed or assessed with clinical judgement   ADL Overall ADL's : Needs assistance/impaired                                       General ADL Comments: Pt performing ADLs at supervision-Min Guard A. Min Guard for safety in standing. Provided education on UTI prevention including toilet hygiene techniques, using anti-baterial soap, and changing pads regularly.  Pt  near baseline function.     Vision Baseline Vision/History: Wears glasses Wears Glasses: At all times Patient Visual Report: No change from baseline       Perception     Praxis      Pertinent Vitals/Pain Pain Assessment: No/denies pain     Hand Dominance Right   Extremity/Trunk Assessment Upper Extremity Assessment Upper Extremity Assessment: Generalized weakness   Lower Extremity Assessment Lower Extremity Assessment: Generalized weakness   Cervical / Trunk Assessment Cervical / Trunk Assessment: Kyphotic   Communication Communication Communication: No difficulties   Cognition Arousal/Alertness: Awake/alert Behavior During Therapy: WFL for tasks assessed/performed Overall Cognitive Status: Within Functional Limits for tasks assessed                                     General Comments  Daughter in law present throughout session    Exercises     Shoulder Instructions      Home Living Family/patient expects to be discharged to:: Private residence Living Arrangements: Alone Available Help at Discharge: Neighbor;Available 24 hours/day Type of Home: Independent living facility Home Access: [place]: One level     Bathroom Shower/Tub: Tub/shower unit         Home Equipment: Environmental consultant - 4 wheels;Grab  bars - tub/shower          Prior Functioning/Environment Level of Independence: Independent with assistive device(s)        Comments: Uses rollator for funcitonal mobility. Performs ADLs and alternates between shower and sponge bath at sink. Family brings microwavable meals.         OT Problem List: Decreased strength;Decreased activity tolerance;Impaired balance (sitting and/or standing);Decreased knowledge of use of DME or AE      OT Treatment/Interventions:      OT Goals(Current goals can be found in the care plan section) Acute Rehab OT Goals Patient Stated Goal: return home OT Goal Formulation: With  patient Time For Goal Achievement: 05/12/17 Potential to Achieve Goals: Good  OT Frequency:     Barriers to D/C:            Co-evaluation              AM-PAC PT "6 Clicks" Daily Activity     Outcome Measure Help from another person eating meals?: None Help from another person taking care of personal grooming?: None Help from another person toileting, which includes using toliet, bedpan, or urinal?: A Little Help from another person bathing (including washing, rinsing, drying)?: A Little Help from another person to put on and taking off regular upper body clothing?: None Help from another person to put on and taking off regular lower body clothing?: A Little 6 Click Score: 21   End of Session Equipment Utilized During Treatment: Rolling walker Nurse Communication: Mobility status  Activity Tolerance: Patient tolerated treatment well Patient left: in chair;with call bell/phone within reach;with family/visitor present  OT Visit Diagnosis: Unsteadiness on feet (R26.81);Other abnormalities of gait and mobility (R26.89);Muscle weakness (generalized) (M62.81)                Time: 8978-4784 OT Time Calculation (min): 22 min Charges:  OT General Charges $OT Visit: 1 Visit OT Evaluation $OT Eval Low Complexity: 1 Low G-Codes:     Lady Wisham MSOT, OTR/L Acute Rehab Pager: [phone] Office: [place] 04/28/2017, 5:24 PM

## 2017-04-28 NOTE — Discharge Instructions (Signed)

## 2017-04-28 NOTE — Discharge Summary (Signed)
Physician Discharge Summary  Kimberly Bass ZOX:[id number] DOB: 05/25/1926 DOA: 04/26/2017  PCP: Merri Brunette, MD  Admit date: 04/26/2017 Discharge date: 04/28/2017  Admitted From: ILF Disposition: ILF  Recommendations for Outpatient Follow-up:  1. Follow up with PCP in 1 week 2. Please obtain BMP/CBC in one week 3. Please follow up on the following pending results: None  Home Health: PT/OT Equipment/Devices: None  Discharge Condition: Stable CODE STATUS: Full code Diet recommendation: Heart healthy   Brief/Interim Summary:  Admission HPI written by Hillary Bow, DO   Chief Complaint: generalized weakness  HPI: Kimberly Bass is a 83 y.o. female with medical history significant of HTN, benign essential tremor.  Patient presents to the ED with c/o generalized weakness.  Symptoms onset this evening while cooking dinner.  Felt generally weak.  No LOC nor seizures but had difficulty standing.  No trauma.  No HA, no CP.  No new meds.  EMS called.  BP noted to be low on standing (80s).   ED Course: UA shows UTI.  Given 500 NS bolus and cipro.  BPs running on low side (80s) when she falls asleep but low 100s systolic when awake.    Hospital course:  UTI Urinalysis significant for rare bacteria, hemoglobin, RBC and WBC.  Patient also with symptoms of dysuria, hesitancy, and urgency.  Patient was treated empirically with, urine culture was not obtained prior to initiation of antibiotics.  Patient had improvement in symptoms with ceftriaxone and she was transitioned to cefpodoxime at discharge.  Hypotension Improvement with IV fluids.  Blood pressure medications were held on admission.  Blood pressure on the lower side prior to discharge. Amlodipine and hydrochlorothiazide-triamterene discontinued at discharge. Imdur and metoprolol continued. Need to reevaluate her hypertension an outpatient and restart medication as needed.  Orthostatic vitals were  negative.  History of low back pain Chronic.  Continue Neurontin.  Decreased dose to 300 mg 4 times daily   Adjusted for creatinine clearance.  Hyperlipidemia Continued simvastatin  History of TIA continue aspirin and Plavix in addition to simvastatin.   Discharge Diagnoses:  Principal Problem:   UTI (urinary tract infection) Active Problems:   Hx of low back pain   Tremor, essential   Hypotension    Discharge Instructions  Discharge Instructions    Diet - low sodium heart healthy    Complete by:  As directed    Increase activity slowly    Complete by:  As directed      Allergies as of 04/28/2017      Reactions   Codeine Nausea Only   Meperidine Hcl Nausea Only   Morphine Nausea Only   Penicillins Nausea Only      Medication List    STOP taking these medications   amLODipine 5 MG tablet Commonly known as:  NORVASC   isosorbide mononitrate 60 MG 24 hr tablet Commonly known as:  IMDUR   metoprolol tartrate 100 MG tablet Commonly known as:  LOPRESSOR   triamterene-hydrochlorothiazide 37.5-25 MG tablet Commonly known as:  MAXZIDE-25     TAKE these medications   aspirin 81 MG tablet Take 81 mg by mouth daily.   baclofen 20 MG tablet Commonly known as:  LIORESAL TAKE 1 AND 1/2 TABLETS BY  MOUTH IN THE MORNING AND IN THE EVENING AND 1 TABLET AT NOON   CALCIUM + D PO Take 1 tablet by mouth 2 (two) times daily.   cefpodoxime 200 MG tablet Commonly known as:  VANTIN Take 1 tablet (200  mg total) by mouth 2 (two) times daily.   clopidogrel 75 MG tablet Commonly known as:  PLAVIX Take 1 tablet (75 mg total) by mouth daily.   gabapentin 300 MG capsule Commonly known as:  NEURONTIN Take 1 capsule (300 mg total) by mouth 4 (four) times daily -  before meals and at bedtime. What changed:  medication strength  See the new instructions.   levothyroxine 75 MCG tablet Commonly known as:  SYNTHROID, LEVOTHROID Take 75 mcg by mouth daily.   mirabegron ER  50 MG Tb24 tablet Commonly known as:  MYRBETRIQ Take 50 mg by mouth 2 (two) times daily.   multivitamin tablet Take 1 tablet by mouth daily.   NITROSTAT 0.4 MG SL tablet Generic drug:  nitroGLYCERIN DISSOLVE ONE TABLET UNDER THE TONGUE EVERY 5 MINUTES AS NEEDED FOR CHEST PAIN.  DO NOT EXCEED A TOTAL OF 3 DOSES IN 15 MINUTES   ranitidine 150 MG tablet Commonly known as:  ZANTAC Take 150 mg by mouth 2 (two) times daily.   simvastatin 20 MG tablet Commonly known as:  ZOCOR Take 20 mg by mouth at bedtime.   traZODone 50 MG tablet Commonly known as:  DESYREL Take 75 mg by mouth at bedtime. Takes one and one half tablet at bedtime.   TYLENOL ARTHRITIS PAIN 650 MG CR tablet Generic drug:  acetaminophen Take 650 mg by mouth 2 (two) times daily.   vitamin B-12 1000 MCG tablet Commonly known as:  CYANOCOBALAMIN Take 1,000 mcg by mouth daily.      Follow-up Information    Care, [facility] Follow up.   Specialty:  Home Health Services Why:  home health services arranged, office will call and set up home visits Contact information: [address] [place] [address] [phone]        Merri Brunette, MD. Schedule an appointment as soon as possible for a visit in 1 week(s).   Specialty:  Internal Medicine Contact information: [address] [place] [address] [phone]          Allergies  Allergen Reactions  . Codeine Nausea Only  . Meperidine Hcl Nausea Only  . Morphine Nausea Only  . Penicillins Nausea Only    Consultations:  None   Procedures/Studies: Ct Head Wo Contrast  Result Date: 04/27/2017 CLINICAL DATA:  Altered level of consciousness. Patient also reports fall today. EXAM: CT HEAD WITHOUT CONTRAST TECHNIQUE: Contiguous axial images were obtained from the base of the skull through the vertex without intravenous contrast. COMPARISON:  CT 06/19/2016 FINDINGS: Brain: Mild atrophy, normal for age. Mild chronic  small vessel ischemia is similar. Chronic small focal encephalomalacia in the left parietal lobe. No intracranial hemorrhage, mass effect, or midline shift. No hydrocephalus. The basilar cisterns are patent. No evidence of territorial infarct or acute ischemia. No extra-axial or intracranial fluid collection. Vascular: Atherosclerosis of skullbase vasculature without hyperdense vessel or abnormal calcification. Skull: No fracture or focal lesion. Sinuses/Orbits: Unchanged right maxillary sinus mucous retention cyst. No sinus fluid level or inflammation. Bilateral cataract resection. Mastoid air cells are clear. Other: None. IMPRESSION: No acute intracranial abnormality. Electronically Signed   By: Rubye Oaks M.D.   On: 04/27/2017 00:41   Dg Chest Port 1 View  Result Date: 04/27/2017 CLINICAL DATA:  Weakness.  Fall today. EXAM: PORTABLE CHEST 1 VIEW COMPARISON:  Radiograph 07/24/2015.  Chest CT 05/21/2015 FINDINGS: Unchanged heart size and mediastinal contours with mild cardiomegaly. Atherosclerosis of these thoracic aorta. Streaky bibasilar atelectasis.  No consolidation, pulmonary edema, large pleural effusion or pneumothorax. Surgical hardware in the lower cervical spine is partially included. IMPRESSION: Streaky bibasilar atelectasis. Unchanged cardiomegaly and aortic atherosclerosis. Electronically Signed   By: Rubye OaksMelanie  Ehinger M.D.   On: 04/27/2017 00:45      Subjective: Afebrile  Discharge Exam: Vitals:   04/28/17 1528 04/28/17 1530  BP: (!) 143/74   Pulse:  (!) 102  Resp: 18 18  Temp:    SpO2:     Vitals:   04/28/17 0550 04/28/17 1526 04/28/17 1528 04/28/17 1530  BP: (!) 108/41 (!) 159/70 (!) 143/74   Pulse: 84 95  (!) 102  Resp: 17 18 18 18   Temp: 98.5 F (36.9 C)     TempSrc: Oral     SpO2: 95%     Weight:      Height:        General: Pt is alert, awake, not in acute distress Cardiovascular: RRR, S1/S2 +, no rubs, no gallops Respiratory: CTA bilaterally, no  wheezing, no rhonchi Abdominal: Soft, NT, ND, bowel sounds + Extremities: no edema, no cyanosis    The results of significant diagnostics from this hospitalization (including imaging, microbiology, ancillary and laboratory) are listed below for reference.     Microbiology: No results found for this or any previous visit (from the past 240 hour(s)).   Labs: BNP (last 3 results) No results for input(s): BNP in the last 8760 hours. Basic Metabolic Panel:  Recent Labs Lab 04/26/17 2344 04/28/17 1116  NA 140 142  K 3.1* 3.4*  CL 100* 105  CO2 31 28  GLUCOSE 116* 107*  BUN 13 6  CREATININE 0.79 0.59  CALCIUM 9.2 8.8*  MG  --  1.8   Liver Function Tests: No results for input(s): AST, ALT, ALKPHOS, BILITOT, PROT, ALBUMIN in the last 168 hours. No results for input(s): LIPASE, AMYLASE in the last 168 hours. No results for input(s): AMMONIA in the last 168 hours. CBC:  Recent Labs Lab 04/26/17 2344  WBC 7.2  HGB 13.6  HCT 41.8  MCV 87.8  PLT 140*   Cardiac Enzymes: No results for input(s): CKTOTAL, CKMB, CKMBINDEX, TROPONINI in the last 168 hours. BNP: Invalid input(s): POCBNP CBG:  Recent Labs Lab 04/26/17 2341  GLUCAP 113*   D-Dimer No results for input(s): DDIMER in the last 72 hours. Hgb A1c No results for input(s): HGBA1C in the last 72 hours. Lipid Profile No results for input(s): CHOL, HDL, LDLCALC, TRIG, CHOLHDL, LDLDIRECT in the last 72 hours. Thyroid function studies  Recent Labs  04/28/17 1116  TSH 1.400   Anemia work up No results for input(s): VITAMINB12, FOLATE, FERRITIN, TIBC, IRON, RETICCTPCT in the last 72 hours. Urinalysis    Component Value Date/Time   COLORURINE YELLOW 04/27/2017 0019   APPEARANCEUR CLOUDY (A) 04/27/2017 0019   LABSPEC 1.018 04/27/2017 0019   PHURINE 5.0 04/27/2017 0019   GLUCOSEU NEGATIVE 04/27/2017 0019   HGBUR MODERATE (A) 04/27/2017 0019   BILIRUBINUR NEGATIVE 04/27/2017 0019   KETONESUR NEGATIVE  04/27/2017 0019   PROTEINUR 30 (A) 04/27/2017 0019   NITRITE NEGATIVE 04/27/2017 0019   LEUKOCYTESUR LARGE (A) 04/27/2017 0019    SIGNED:   Jacquelin Hawkingalph Samirah Scarpati, MD [facility] 04/28/2017, 2:05 PM Pager [phone]  If 7PM-7AM, please contact night-coverage [email or web address] Password [email or web address]

## 2017-04-28 NOTE — Progress Notes (Signed)
Physical Therapy Treatment Patient Details Name: Kimberly Bass MRN: [id number] DOB: 11/12/1925 Today's Date: 04/28/2017    History of Present Illness Pt is an 83 y/o female admitted secondary to generalized weakness and found to have a UTI. PMH including but not limited to HTN, CAD and essential tremor.    PT Comments    Improving steadily.  Emphasis on gait stability, safe use of a device.   Follow Up Recommendations  Home health PT     Equipment Recommendations  None recommended by PT    Recommendations for Other Services       Precautions / Restrictions Precautions Precautions: Fall Restrictions Weight Bearing Restrictions: No    Mobility  Bed Mobility               General bed mobility comments: Pt at EOB upon arrival  Transfers Overall transfer level: Needs assistance Equipment used: Rolling walker (2 wheeled) Transfers: Sit to/from Stand Sit to Stand: Supervision         General transfer comment: for safety  Ambulation/Gait Ambulation/Gait assistance: Supervision Ambulation Distance (Feet): 70 Feet (then additional 100 feet) Assistive device: Rolling walker (2 wheeled) Gait Pattern/deviations: Step-through pattern;Decreased step length - right;Decreased step length - left;Decreased stride length Gait velocity: decreased Gait velocity interpretation: at or above normal speed for age/gender General Gait Details: mild unsteadiness in RW, mostly involving not maneuvering the RW well.   Stairs            Wheelchair Mobility    Modified Rankin (Stroke Patients Only)       Balance Overall balance assessment: Needs assistance Sitting-balance support: Feet supported Sitting balance-Leahy Scale: Good     Standing balance support: During functional activity;Bilateral upper extremity supported Standing balance-Leahy Scale: Poor Standing balance comment: reliant on the RW                            Cognition  Arousal/Alertness: Awake/alert Behavior During Therapy: WFL for tasks assessed/performed Overall Cognitive Status: Within Functional Limits for tasks assessed                                        Exercises      General Comments General comments (skin integrity, edema, etc.): Daughter in law present throughout session      Pertinent Vitals/Pain Pain Assessment: No/denies pain    Home Living Family/patient expects to be discharged to:: Private residence Living Arrangements: Alone Available Help at Discharge: Neighbor;Available 24 hours/day Type of Home: Independent living facility Home Access: Elevator   Home Layout: One level Home Equipment: Walker - 4 wheels;Grab bars - tub/shower      Prior Function Level of Independence: Independent with assistive device(s)      Comments: Uses rollator for funcitonal mobility. Performs ADLs and alternates between shower and sponge bath at sink. Family brings microwavable meals.    PT Goals (current goals can now be found in the care plan section) Acute Rehab PT Goals Patient Stated Goal: return home Time For Goal Achievement: 05/11/17 Potential to Achieve Goals: Good    Frequency    Min 3X/week      PT Plan      Co-evaluation              AM-PAC PT "6 Clicks" Daily Activity  Outcome Measure  Difficulty turning over in bed (including adjusting bedclothes,  sheets and blankets)?: A Little Difficulty moving from lying on back to sitting on the side of the bed? : A Little Difficulty sitting down on and standing up from a chair with arms (e.g., wheelchair, bedside commode, etc,.)?: A Little Help needed moving to and from a bed to chair (including a wheelchair)?: A Little Help needed walking in hospital room?: A Little Help needed climbing 3-5 steps with a railing? : A Little 6 Click Score: 18    End of Session   Activity Tolerance: Patient tolerated treatment well Patient left: Other (comment) (In w/c  heading out the door) Nurse Communication: Mobility status PT Visit Diagnosis: Unsteadiness on feet (R26.81)     Time: [id number] PT Time Calculation (min) (ACUTE ONLY): 13 min  Charges:  $Gait Training: 8-22 mins                    G Codes:       04/28/2017  Drain BingKen Senaya Dicenso, PT [phone]  (pager)   Eliseo GumKenneth V Judah Carchi 04/28/2017, 6:23 PM

## 2017-04-28 NOTE — Consult Note (Signed)
            [facility] CM Primary Care Navigator  04/28/2017  Pearlean BrownieJeanneane H Dusek 01/04/1926 [id number]   Went to see patient at the bedside to identify possible discharge needs but she was justdischarged per staff report.  Patient was discharged home with home health services per therapy recommendation.  Primary care provider's office is listed as doing transition of care (TOC). Patient has a discharge instruction to follow-up with  primary care provider in a week.   For additional questions please contact:  Karin GoldenLorraine A. Syncere Kaminski, BSN, RN-BC [facility] PRIMARY CARE Navigator Cell: [phone]

## 2017-04-28 NOTE — Care Management Note (Signed)
Case Management Note  Patient Details  Name: Pearlean BrownieJeanneane H Spitler MRN: [id number] Date of Birth: 07/10/1925  Subjective/Objective:                  Admitted secondary to generalized weakness and found to have a UTI. PMH including but not limited to HTN, CAD and essential tremor.  PCP: Merri BrunetteWalter Pharr  Action/Plan: Plan is to d/c to home today pending lab.  Expected Discharge Date:                  Expected Discharge Plan:  Home w Home Health Services  In-House Referral:     Discharge planning Services  CM Consult  Post Acute Care Choice:    Choice offered to:  Patient  DME Arranged:    DME Agency:     HH Arranged:  PT HH Agency[facility]  [facility], referral made pending MD's order. CM has request order from MD.  Status of Service:  Completed, signed off  If discussed at Long Length of Stay Meetings, dates discussed:    Additional Comments:  Epifanio LeschesCole, Hope Holst Hudson, RN 04/28/2017, 12:09 PM

## 2017-05-05 ENCOUNTER — Encounter: Payer: Self-pay | Admitting: Cardiovascular Disease

## 2017-05-05 ENCOUNTER — Ambulatory Visit: Payer: Medicare Other | Admitting: Cardiovascular Disease

## 2017-05-05 VITALS — BP 118/58 | HR 65 | Ht 59.0 in | Wt 155.0 lb

## 2017-05-05 DIAGNOSIS — E785 Hyperlipidemia, unspecified: Secondary | ICD-10-CM | POA: Insufficient documentation

## 2017-05-05 DIAGNOSIS — I2510 Atherosclerotic heart disease of native coronary artery without angina pectoris: Secondary | ICD-10-CM | POA: Diagnosis not present

## 2017-05-05 DIAGNOSIS — E782 Mixed hyperlipidemia: Secondary | ICD-10-CM

## 2017-05-05 NOTE — Patient Instructions (Signed)

## 2017-05-05 NOTE — Progress Notes (Signed)
Kimberly BrownieJeanneane H Bass Date of Birth  12/08/1925       [facility]    [place] [facility] [address]  [address] [place], [place]  [address]   [place]  [address] [phone]     [phone]   Fax  [phone]    Fax [phone]  Problem List: 1. Coronary artery disease-is a tight stenosis in the distal aspect of her circumflex artery 2. Hyperthyroidism 3. Hyperlipidemia 4. Hypertension  History of Present Illness: Kimberly Bass is an 83 yo with a hx of CAD. She had a heart catheterization on January 03, 2006. She has a very tight stenosis in the terminal aspect of a large posterior lateral branch. This stenosis is very tortuous and is diffusely diseased and is not a candidate for PCI.  She's not had any episodes of chest pain or shortness breath. She's been able to do all of her normal activities without any limitations. She used  to have a good bit of angina but has not noticed any in the past several months.  December 27, 2012:  Kimberly Bass is doing OK.  She is getting weaker - came in with a walker today.  She is able to walk shorter distances on her own.  She has leg weakness walking long distances.    She has not had to use her NTG much - on occasion.     01/10/2014: Pt is doing ok.  Still using the walker for long distance walking.  Able to get around the house without.  No CP.   BP is OK  January 20, 2015:  Doing well. No CP .   Aug. 16, 2017:  Doing very well. Using a rolling walker. Does not use the walker at home  No Cp or dyspnea.   Nov. 8, 2018:  Seen with Baker Pieriniavid Wyrick, son Is a little jumpy today  Was in the hospital last week with UTI. No Cp or dyspnea.  Tolerating her meds well Walks with a walker   Current Outpatient Medications on File Prior to Visit  Medication Sig Dispense Refill  . acetaminophen (TYLENOL ARTHRITIS PAIN) 650 MG CR tablet Take 650 mg by mouth 2 (two) times daily.     Marland Kitchen. aspirin 81 MG  tablet Take 81 mg by mouth daily.      . baclofen (LIORESAL) 20 MG tablet TAKE 1 AND 1/2 TABLETS BY  MOUTH IN THE MORNING AND IN THE EVENING AND 1 TABLET AT NOON 360 tablet 1  . Calcium Carbonate-Vitamin D (CALCIUM + D PO) Take 1 tablet by mouth 2 (two) times daily.     . clopidogrel (PLAVIX) 75 MG tablet Take 1 tablet (75 mg total) by mouth daily. 90 tablet 3  . gabapentin (NEURONTIN) 300 MG capsule Take 1 capsule (300 mg total) by mouth 4 (four) times daily -  before meals and at bedtime. 120 capsule 0  . isosorbide mononitrate (IMDUR) 60 MG 24 hr tablet TAKE 1 TABLET BY MOUTH  DAILY 90 tablet 0  . levothyroxine (SYNTHROID, LEVOTHROID) 75 MCG tablet Take 75 mcg by mouth daily.      . metoprolol (LOPRESSOR) 100 MG tablet Take 1 tablet (100 mg total) by mouth 2 (two) times daily. 180 tablet 3  . mirabegron ER (MYRBETRIQ) 50 MG TB24 tablet Take 50 mg by mouth 2 (two) times daily.     . Multiple Vitamin (MULTIVITAMIN) tablet Take 1 tablet by mouth daily.      .Marland Kitchen  NITROSTAT 0.4 MG SL tablet DISSOLVE ONE TABLET UNDER THE TONGUE EVERY 5 MINUTES AS NEEDED FOR CHEST PAIN.  DO NOT EXCEED A TOTAL OF 3 DOSES IN 15 MINUTES 25 tablet 1  . ranitidine (ZANTAC) 150 MG tablet Take 150 mg by mouth 2 (two) times daily.      . simvastatin (ZOCOR) 20 MG tablet Take 20 mg by mouth at bedtime.      . traZODone (DESYREL) 50 MG tablet Take 75 mg by mouth at bedtime. Takes one and one half tablet at bedtime.    . vitamin B-12 (CYANOCOBALAMIN) 1000 MCG tablet Take 1,000 mcg by mouth daily.     No current facility-administered medications on file prior to visit.     Allergies  Allergen Reactions  . Codeine Nausea Only  . Meperidine Hcl Nausea Only  . Morphine Nausea Only  . Penicillins Nausea Only    Past Medical History:  Diagnosis Date  . Arthritis    "mild; in all my joints" (04/27/2017)  . Benign essential tremor   . Coronary artery disease   . Cystitis with hematuria 04/26/2017  . Gait disorder   . H/O  radioactive iodine thyroid ablation   . Hx of transient ischemic attack (TIA)   . Hyperlipidemia   . Hypertension    "not since MI in 2007" (04/27/2017)  . Hyperthyroidism    H/O radioactive iodine thyroid ablation [Z92.3]  . Hypothyroidism   . Lower extremity myoclonus   . Lumbosacral spondylosis   . Migraine    "I've had one in my lifetime" (04/27/2017)  . Mild obesity   . Myocardial infarction (HCC) 12/2005  . Myoclonus    Lower extremity involvement  . Obesity   . Pneumonia    "lots when I was a small child; not at all since" (04/27/2017)  . Seizures (HCC)    History is questionable  . Thyroid disease     Past Surgical History:  Procedure Laterality Date  . ABDOMINAL HYSTERECTOMY  1972   for uterine fibroids/notes 11/10/2010  . ANTERIOR CERVICAL DECOMP/DISCECTOMY FUSION  03/2004   [name] 11/10/2010  . APPENDECTOMY    . BACK SURGERY    . BREAST BIOPSY Right 1996   nodule resected/notes 11/10/2010  . CARDIAC CATHETERIZATION  01/03/2006  . CATARACT EXTRACTION W/ INTRAOCULAR LENS  IMPLANT, BILATERAL Bilateral   . COLONOSCOPY  11/2003   [name] 11/10/2010  . ESOPHAGOGASTRODUODENOSCOPY  09/2006   [name] 11/10/2010  . LUMBAR DISC SURGERY  1990s X 1; 05/2003   "a nerve was pinching"; [name] 11/10/2010  . OVARY SURGERY Left 1999   benign tumor resected /notes 11/10/2010  . TONSILLECTOMY  ~ 39    Social History   Tobacco Use  Smoking Status Never Smoker  Smokeless Tobacco Never Used    Social History   Substance and Sexual Activity  Alcohol Use No    Family History  Problem Relation Age of Onset  . Heart disease Father   . Heart attack Mother   . Stroke Sister   . Heart attack Sister   . Heart attack Brother        Possible  . Diabetes Sister     Reviw of Systems:  Reviewed in the HPI.  All other systems are negative.  Physical Exam: Blood pressure (!) 118/58, pulse 65, height 4\' 11"  (1.499 m), weight 155 lb (70.3 kg), SpO2 93 %.  GEN:  Well nourished,  well developed in no acute distress HEENT: Normal NECK: No JVD; No carotid  bruits LYMPHATICS: No lymphadenopathy CARDIAC: RRR , no murmurs, rubs, gallops RESPIRATORY:  Clear to auscultation without rales, wheezing or rhonchi  ABDOMEN: Soft, non-tender, non-distended MUSCULOSKELETAL:  No edema; No deformity  SKIN: Warm and dry NEUROLOGIC:  Alert and oriented x 3   ECG:  Assessment / Plan:   1. Coronary artery disease-patient has a tight stenosis in her distal circumflex artery.  She is basically asymptomatic.  Continue Imdur .   2. Hyperthyroidism  3. Hyperlipidemia - continue simvastatin   4. Hypertension -blood pressure continues to be well controlled.   Kristeen MissPhilip Cherre Kothari, MD  05/05/2017 11:55 AM    [facility] [address],  [address] [place], [place]  [address] Pager [phone] Phone: [phone]; Fax: [phone]

## 2017-05-10 DIAGNOSIS — E890 Postprocedural hypothyroidism: Secondary | ICD-10-CM | POA: Diagnosis not present

## 2017-05-10 DIAGNOSIS — I10 Essential (primary) hypertension: Secondary | ICD-10-CM | POA: Diagnosis not present

## 2017-05-10 DIAGNOSIS — E559 Vitamin D deficiency, unspecified: Secondary | ICD-10-CM | POA: Diagnosis not present

## 2017-05-10 DIAGNOSIS — N390 Urinary tract infection, site not specified: Secondary | ICD-10-CM | POA: Diagnosis not present

## 2017-05-10 DIAGNOSIS — Z7982 Long term (current) use of aspirin: Secondary | ICD-10-CM | POA: Diagnosis not present

## 2017-05-10 DIAGNOSIS — Z0000 Encounter for general adult medical examination without abnormal findings: Secondary | ICD-10-CM | POA: Diagnosis not present

## 2017-05-13 DIAGNOSIS — G253 Myoclonus: Secondary | ICD-10-CM | POA: Diagnosis not present

## 2017-05-13 DIAGNOSIS — H6121 Impacted cerumen, right ear: Secondary | ICD-10-CM | POA: Diagnosis not present

## 2017-05-13 DIAGNOSIS — Z0001 Encounter for general adult medical examination with abnormal findings: Secondary | ICD-10-CM | POA: Diagnosis not present

## 2017-05-16 ENCOUNTER — Emergency Department (HOSPITAL_COMMUNITY): Payer: Medicare Other

## 2017-05-16 ENCOUNTER — Encounter (HOSPITAL_COMMUNITY): Payer: Self-pay

## 2017-05-16 ENCOUNTER — Emergency Department (HOSPITAL_COMMUNITY)
Admission: EM | Admit: 2017-05-16 | Discharge: 2017-05-16 | Disposition: A | Discharge status: Home / Self Care | Payer: Medicare Other | Attending: Emergency Medicine | Admitting: Emergency Medicine

## 2017-05-16 DIAGNOSIS — Y998 Other external cause status: Secondary | ICD-10-CM | POA: Diagnosis not present

## 2017-05-16 DIAGNOSIS — Z79899 Other long term (current) drug therapy: Secondary | ICD-10-CM | POA: Diagnosis not present

## 2017-05-16 DIAGNOSIS — I252 Old myocardial infarction: Secondary | ICD-10-CM | POA: Insufficient documentation

## 2017-05-16 DIAGNOSIS — W010XXA Fall on same level from slipping, tripping and stumbling without subsequent striking against object, initial encounter: Secondary | ICD-10-CM | POA: Diagnosis not present

## 2017-05-16 DIAGNOSIS — I10 Essential (primary) hypertension: Secondary | ICD-10-CM | POA: Diagnosis not present

## 2017-05-16 DIAGNOSIS — Y939 Activity, unspecified: Secondary | ICD-10-CM | POA: Insufficient documentation

## 2017-05-16 DIAGNOSIS — E039 Hypothyroidism, unspecified: Secondary | ICD-10-CM | POA: Insufficient documentation

## 2017-05-16 DIAGNOSIS — I2510 Atherosclerotic heart disease of native coronary artery without angina pectoris: Secondary | ICD-10-CM | POA: Insufficient documentation

## 2017-05-16 DIAGNOSIS — E785 Hyperlipidemia, unspecified: Secondary | ICD-10-CM | POA: Insufficient documentation

## 2017-05-16 DIAGNOSIS — Y92008 Other place in unspecified non-institutional (private) residence as the place of occurrence of the external cause: Secondary | ICD-10-CM | POA: Insufficient documentation

## 2017-05-16 DIAGNOSIS — W19XXXA Unspecified fall, initial encounter: Secondary | ICD-10-CM

## 2017-05-16 DIAGNOSIS — S0181XA Laceration without foreign body of other part of head, initial encounter: Secondary | ICD-10-CM | POA: Diagnosis not present

## 2017-05-16 DIAGNOSIS — R9082 White matter disease, unspecified: Secondary | ICD-10-CM | POA: Diagnosis not present

## 2017-05-16 DIAGNOSIS — S0083XA Contusion of other part of head, initial encounter: Secondary | ICD-10-CM | POA: Diagnosis not present

## 2017-05-16 DIAGNOSIS — M542 Cervicalgia: Secondary | ICD-10-CM | POA: Diagnosis not present

## 2017-05-16 DIAGNOSIS — S0990XA Unspecified injury of head, initial encounter: Secondary | ICD-10-CM

## 2017-05-16 DIAGNOSIS — Z7901 Long term (current) use of anticoagulants: Secondary | ICD-10-CM | POA: Diagnosis not present

## 2017-05-16 NOTE — ED Provider Notes (Signed)
[facility] EMERGENCY DEPARTMENT Provider Note   CSN: [id number] Arrival date & time: 05/16/17  7829     History   Chief Complaint No chief complaint on file.   HPI Kimberly Bass is a 83 y.o. female.  HPI  Pt with hx of TIA, CAD, presenting after fall.  She states she woke up and got out of bed, was going to the bathroom- had her slippers on and she slipped and fell in front of the bathroom door. She denies LOC.  She has no amnesia for the event.  Denies pain.  She has had other mechanical falls in the past.  States she has her usual energy level and does not have any complaints.  No vomiting.  She does take plavix.  Denies neck pain.    Past Medical History:  Diagnosis Date  . Arthritis    "mild; in all my joints" (04/27/2017)  . Benign essential tremor   . Coronary artery disease   . Cystitis with hematuria 04/26/2017  . Gait disorder   . H/O radioactive iodine thyroid ablation   . Hx of transient ischemic attack (TIA)   . Hyperlipidemia   . Hypertension    "not since MI in 2007" (04/27/2017)  . Hyperthyroidism    H/O radioactive iodine thyroid ablation [Z92.3]  . Hypothyroidism   . Lower extremity myoclonus   . Lumbosacral spondylosis   . Migraine    "I've had one in my lifetime" (04/27/2017)  . Mild obesity   . Myocardial infarction (HCC) 12/2005  . Myoclonus    Lower extremity involvement  . Obesity   . Pneumonia    "lots when I was a small child; not at all since" (04/27/2017)  . Seizures (HCC)    History is questionable  . Thyroid disease     Patient Active Problem List   Diagnosis Date Noted  . Hyperlipidemia 05/05/2017  . UTI (urinary tract infection) 04/27/2017  . Hypotension 04/27/2017  . Tremor, essential 11/03/2015  . 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 Abnormality of gait 08/15/2012  . Essential and other specified forms of tremor 08/15/2012  . Degeneration of lumbar or lumbosacral intervertebral disc 08/15/2012  . Hx of low  back pain 08/15/2012  . Pain of left leg 08/15/2012  . CAD (coronary artery disease) 12/23/2010  . Palpitations 12/23/2010  . Hx of transient ischemic attack (TIA)   . CONSTIPATION 02/27/2009  . PERSONAL HX COLONIC POLYPS 02/27/2009    Past Surgical History:  Procedure Laterality Date  . ABDOMINAL HYSTERECTOMY  1972   for uterine fibroids/notes 11/10/2010  . ANTERIOR CERVICAL DECOMP/DISCECTOMY FUSION  03/2004   [name] 11/10/2010  . APPENDECTOMY    . BACK SURGERY    . BREAST BIOPSY Right 1996   nodule resected/notes 11/10/2010  . CARDIAC CATHETERIZATION  01/03/2006  . CATARACT EXTRACTION W/ INTRAOCULAR LENS  IMPLANT, BILATERAL Bilateral   . COLONOSCOPY  11/2003   [name] 11/10/2010  . ESOPHAGOGASTRODUODENOSCOPY  09/2006   [name] 11/10/2010  . LUMBAR DISC SURGERY  1990s X 1; 05/2003   "a nerve was pinching"; [name] 11/10/2010  . OVARY SURGERY Left 1999   benign tumor resected /notes 11/10/2010  . TONSILLECTOMY  ~ 1947    OB History    No data available       Home Medications    Prior to Admission medications   Medication Sig Start Date End Date Taking? Authorizing Provider  acetaminophen (TYLENOL ARTHRITIS PAIN) 650 MG CR tablet Take 650 mg by mouth  2 (two) times daily.     [provider]  aspirin 81 MG tablet Take 81 mg by mouth daily.      [provider]  baclofen (LIORESAL) 20 MG tablet TAKE 1 AND 1/2 TABLETS BY  MOUTH IN THE MORNING AND IN THE EVENING AND 1 TABLET AT NOON 10/27/16   York SpanielWillis, Charles K, MD  Calcium Carbonate-Vitamin D (CALCIUM + D PO) Take 1 tablet by mouth 2 (two) times daily.     [provider]  clopidogrel (PLAVIX) 75 MG tablet Take 1 tablet (75 mg total) by mouth daily. 04/07/16   Nahser, Deloris PingPhilip J, MD  gabapentin (NEURONTIN) 300 MG capsule Take 1 capsule (300 mg total) by mouth 4 (four) times daily -  before meals and at bedtime. 04/28/17   Narda BondsNettey, Ralph A, MD  isosorbide mononitrate (IMDUR) 60 MG 24 hr tablet TAKE 1 TABLET BY  MOUTH  DAILY 02/16/17   Nahser, Deloris PingPhilip J, MD  levothyroxine (SYNTHROID, LEVOTHROID) 75 MCG tablet Take 75 mcg by mouth daily.      [provider]  metoprolol (LOPRESSOR) 100 MG tablet Take 1 tablet (100 mg total) by mouth 2 (two) times daily. 04/07/16   Nahser, Deloris PingPhilip J, MD  mirabegron ER (MYRBETRIQ) 50 MG TB24 tablet Take 50 mg by mouth 2 (two) times daily.     [provider]  Multiple Vitamin (MULTIVITAMIN) tablet Take 1 tablet by mouth daily.      [provider]  NITROSTAT 0.4 MG SL tablet DISSOLVE ONE TABLET UNDER THE TONGUE EVERY 5 MINUTES AS NEEDED FOR CHEST PAIN.  DO NOT EXCEED A TOTAL OF 3 DOSES IN 15 MINUTES 10/11/12   Nahser, Deloris PingPhilip J, MD  ranitidine (ZANTAC) 150 MG tablet Take 150 mg by mouth 2 (two) times daily.      [provider]  simvastatin (ZOCOR) 20 MG tablet Take 20 mg by mouth at bedtime.      [provider]  traZODone (DESYREL) 50 MG tablet Take 75 mg by mouth at bedtime. Takes one and one half tablet at bedtime.    [provider]  vitamin B-12 (CYANOCOBALAMIN) 1000 MCG tablet Take 1,000 mcg by mouth daily.    [provider]    Family History Family History  Problem Relation Age of Onset  . Heart disease Father   . Heart attack Mother   . Stroke Sister   . Heart attack Sister   . Heart attack Brother        Possible  . Diabetes Sister     Social History Social History   Tobacco Use  . Smoking status: Never Smoker  . Smokeless tobacco: Never Used  Substance Use Topics  . Alcohol use: No  . Drug use: No     Allergies   Codeine; Meperidine hcl; Morphine; and Penicillins   Review of Systems Review of Systems  ROS reviewed and all otherwise negative except for mentioned in HPI   Physical Exam Updated Vital Signs BP (!) 138/58 (BP Location: Left Arm)   Pulse 68   Temp (!) 97.5 F (36.4 C) (Oral)   Resp 16   SpO2 98%  Vitals reviewed Physical Exam  Physical Examination: General  appearance - alert, well appearing, and in no distress Mental status - alert, oriented to person, place, and time Eyes - pupils equal and reactive, extraocular eye movements intact without pain Face- hematoma around left eye and hematoma on left forehead Mouth - mucous membranes moist, pharynx normal  without lesions Neck - no midline tenderness to palpation, FROM without pain, cervical collar cleared by me Chest - clear to auscultation, no wheezes, rales or rhonchi, symmetric air entry Heart - normal rate, regular rhythm, normal S1, S2, no murmurs, rubs, clicks or gallops Abdomen - soft, nontender, nondistended, no masses or organomegaly Back exam - no midline tenderness to palpation Neurological - alert, oriented x 3, normal speech, moving all extremities Musculoskeletal - no joint tenderness, deformity or swelling Extremities - peripheral pulses normal, no pedal edema, no clubbing or cyanosis Skin - normal coloration and turgor, no rashes, other than hematoma as noted   ED Treatments / Results  Labs (all labs ordered are listed, but only abnormal results are displayed) Labs Reviewed - No data to display  EKG  EKG Interpretation None       Radiology Ct Head Wo Contrast  Result Date: 05/16/2017 CLINICAL DATA:  Neck pain. EXAM: CT HEAD WITHOUT CONTRAST CT CERVICAL SPINE WITHOUT CONTRAST TECHNIQUE: Multidetector CT imaging of the head and cervical spine was performed following the standard protocol without intravenous contrast. Multiplanar CT image reconstructions of the cervical spine were also generated. COMPARISON:  CT scan of April 27, 2017. FINDINGS: CT HEAD FINDINGS Brain: Mild chronic ischemic white matter disease is noted. No mass effect or midline shift is noted. Ventricular size is within normal limits. There is no evidence of mass lesion, hemorrhage or acute infarction. Vascular: No hyperdense vessel or unexpected calcification. Skull: Normal. Negative for fracture or focal  lesion. Sinuses/Orbits: Right maxillary mucous retention cyst is noted. Other: None. CT CERVICAL SPINE FINDINGS Alignment: Normal. Skull base and vertebrae: Status post surgical anterior fusion of C5-6 and C6-7. No definite fracture is noted. Soft tissues and spinal canal: No prevertebral fluid or swelling. No visible canal hematoma. Disc levels: Severe degenerative disc disease is noted at C3-4 and C4-5. Upper chest: Negative. Other: None. IMPRESSION: Mild chronic ischemic white matter disease. No acute intracranial abnormality seen. Postsurgical and degenerative changes as described above are noted in the cervical spine. No fracture or spondylolisthesis is noted. Electronically Signed   By: Lupita RaiderJames  Green Jr, M.D.   On: 05/16/2017 10:45   Ct Cervical Spine Wo Contrast  Result Date: 05/16/2017 CLINICAL DATA:  Neck pain. EXAM: CT HEAD WITHOUT CONTRAST CT CERVICAL SPINE WITHOUT CONTRAST TECHNIQUE: Multidetector CT imaging of the head and cervical spine was performed following the standard protocol without intravenous contrast. Multiplanar CT image reconstructions of the cervical spine were also generated. COMPARISON:  CT scan of April 27, 2017. FINDINGS: CT HEAD FINDINGS Brain: Mild chronic ischemic white matter disease is noted. No mass effect or midline shift is noted. Ventricular size is within normal limits. There is no evidence of mass lesion, hemorrhage or acute infarction. Vascular: No hyperdense vessel or unexpected calcification. Skull: Normal. Negative for fracture or focal lesion. Sinuses/Orbits: Right maxillary mucous retention cyst is noted. Other: None. CT CERVICAL SPINE FINDINGS Alignment: Normal. Skull base and vertebrae: Status post surgical anterior fusion of C5-6 and C6-7. No definite fracture is noted. Soft tissues and spinal canal: No prevertebral fluid or swelling. No visible canal hematoma. Disc levels: Severe degenerative disc disease is noted at C3-4 and C4-5. Upper chest: Negative.  Other: None. IMPRESSION: Mild chronic ischemic white matter disease. No acute intracranial abnormality seen. Postsurgical and degenerative changes as described above are noted in the cervical spine. No fracture or spondylolisthesis is noted. Electronically Signed   By: Lupita RaiderJames  Green Jr, M.D.   On: 05/16/2017 10:45  Procedures Procedures (including critical care time)  Medications Ordered in ED Medications - No data to display   Initial Impression / Assessment and Plan / ED Course  I have reviewed the triage vital signs and the nursing notes.  Pertinent labs & imaging results that were available during my care of the patient were reviewed by me and considered in my medical decision making (see chart for details).     Pt presenting with c/o mechanical fall.  Pt is very clear on details of the fall.  No LOC, no dizziness.  Normal exam with the exception of left sided periorbital and forehead hematoma. No vision changes or findings of EOM entrapment.  No changes in mental status or energy level.  Discharged with strict return precautions.  Pt agreeable with plan. Advised close followup with PMD  Final Clinical Impressions(s) / ED Diagnoses   Final diagnoses:  Fall, initial encounter  Minor head injury, initial encounter  Facial hematoma, initial encounter    ED Discharge Orders    None       Saurav Crumble, Latanya Maudlin, MD 05/16/17 1453

## 2017-05-16 NOTE — Discharge Instructions (Signed)
Return to the ED with any concerns including headache, changes in vision, vomiting , seizure activity, decreased mental status, decreased level of alertness/lethargy, or any other alarming symptoms

## 2017-05-16 NOTE — ED Triage Notes (Signed)
[facility] while walking to bathroom. Denies loc. Arrived with c-collar to neck. Hematoma and abrasions to left side of face

## 2017-05-26 ENCOUNTER — Other Ambulatory Visit: Payer: Self-pay | Admitting: Neurology

## 2017-07-14 ENCOUNTER — Other Ambulatory Visit: Payer: Self-pay | Admitting: Cardiovascular Disease

## 2017-07-21 ENCOUNTER — Other Ambulatory Visit: Payer: Self-pay | Admitting: Cardiovascular Disease

## 2017-07-24 ENCOUNTER — Telehealth: Payer: Self-pay | Admitting: Neurology

## 2017-07-24 MED ORDER — GABAPENTIN 400 MG PO CAPS: 400.0000 mg | ORAL_CAPSULE | Freq: Four times a day (QID) | ORAL | 3 refills | Status: DC

## 2017-07-24 NOTE — Telephone Encounter (Signed)
The patient called. She needs a Rx for her gabapentin. I will send this in.

## 2017-09-09 DIAGNOSIS — Z8673 Personal history of transient ischemic attack (TIA), and cerebral infarction without residual deficits: Secondary | ICD-10-CM | POA: Diagnosis not present

## 2017-09-09 DIAGNOSIS — I10 Essential (primary) hypertension: Secondary | ICD-10-CM | POA: Diagnosis not present

## 2017-10-18 DIAGNOSIS — Z961 Presence of intraocular lens: Secondary | ICD-10-CM | POA: Diagnosis not present

## 2017-10-18 DIAGNOSIS — H52203 Unspecified astigmatism, bilateral: Secondary | ICD-10-CM | POA: Diagnosis not present

## 2017-10-18 DIAGNOSIS — N390 Urinary tract infection, site not specified: Secondary | ICD-10-CM | POA: Diagnosis not present

## 2017-10-18 DIAGNOSIS — H5213 Myopia, bilateral: Secondary | ICD-10-CM | POA: Diagnosis not present

## 2017-10-18 DIAGNOSIS — R410 Disorientation, unspecified: Secondary | ICD-10-CM | POA: Diagnosis not present

## 2017-10-18 DIAGNOSIS — H524 Presbyopia: Secondary | ICD-10-CM | POA: Diagnosis not present

## 2017-11-02 ENCOUNTER — Ambulatory Visit: Payer: Medicare Other | Admitting: Adult Health

## 2017-11-02 ENCOUNTER — Encounter: Payer: Self-pay | Admitting: Adult Health

## 2017-11-02 VITALS — BP 99/63 | HR 68 | Ht 59.0 in | Wt 154.2 lb

## 2017-11-02 DIAGNOSIS — G253 Myoclonus: Secondary | ICD-10-CM

## 2017-11-02 DIAGNOSIS — G250 Essential tremor: Secondary | ICD-10-CM

## 2017-11-02 DIAGNOSIS — R269 Unspecified abnormalities of gait and mobility: Secondary | ICD-10-CM

## 2017-11-02 MED ORDER — BACLOFEN 20 MG PO TABS: ORAL_TABLET | ORAL | 3 refills | Status: DC

## 2017-11-02 NOTE — Patient Instructions (Signed)
Your Plan:  Continue baclofen and gabapentin Use rollator  If your symptoms worsen or you develop new symptoms please let us know.   Thank you for coming to see [place] at [facility]. I hope we have been able to provide you high quality care today.  You may receive a patient satisfaction survey over the next few weeks. We would appreciate your feedback and comments so that we may continue to improve ourselves and the health of our patients.

## 2017-11-02 NOTE — Progress Notes (Signed)
I have read the note, and I agree with the clinical assessment and plan.  Kimberly Bass   

## 2017-11-02 NOTE — Progress Notes (Signed)
PATIENT: Kimberly Bass DOB: August 23, 1925  REASON FOR VISIT: follow up HISTORY FROM: patient  HISTORY OF PRESENT ILLNESS: Today 11/02/17 Kimberly Bass is an 84 year old female with a history of essential tremor, gait disorder and myoclonic jerks.  She returns today for follow-up.  She reports that baclofen and gabapentin has been partial for her gait as well as myoclonic jerks.  She reports that her tremor has remained stable.  States that it does get worse when she is nervous.  She has not had a recent fall.  Her son reports that the last fall was at Thanksgiving but was due to a bladder infection.  Overall she feels that things have remained stable.  She returns today for evaluation.  HISTORY Kimberly Bass is an 84 year old right-handed white female with a history of an essential tremor and a gait disorder. The patient has a history of myoclonic jerks of the legs that are still occurring with some frequency. The use of baclofen and gabapentin has helped the some. She is having some drowsiness on the medication, but she has better gait instability. She had one fall on 06/19/2016 when she was bending over to unplug a light, she fell over and hit a door. She underwent a CT scan of the brain and cervical spine that was unremarkable. The patient uses a walker when she is outside the house, she does not use an assistive device inside the house. She usually stays safe with ambulation. She denies any other significant medical issues that have come up since last seen.   REVIEW OF SYSTEMS: Out of a complete 14 system review of symptoms, the patient complains only of the following symptoms, and all other reviewed systems are negative.  See HPI  ALLERGIES: Allergies  Allergen Reactions  . Codeine Nausea Only  . Meperidine Hcl Nausea Only  . Morphine Nausea Only  . Penicillins Nausea Only    HOME MEDICATIONS: Outpatient Medications Prior to Visit  Medication Sig Dispense Refill  .  acetaminophen (TYLENOL ARTHRITIS PAIN) 650 MG CR tablet Take 650 mg by mouth 2 (two) times daily.     Marland Kitchen amLODipine (NORVASC) 5 MG tablet 5 mg daily.    Marland Kitchen aspirin 81 MG tablet Take 81 mg by mouth daily.      . baclofen (LIORESAL) 20 MG tablet TAKE 1 AND 1/2 TABLETS BY  MOUTH IN THE MORNING AND IN THE EVENING AND 1 TABLET BY MOUTH AT NOON 360 tablet 1  . Calcium Carbonate-Vitamin D (CALCIUM + D PO) Take 1 tablet by mouth 2 (two) times daily.     . ciprofloxacin (CIPRO) 500 MG tablet TAKE 1 TABLET BY MOUTH TWICE A DAY FOR 7 DAYS  0  . clopidogrel (PLAVIX) 75 MG tablet TAKE 1 TABLET BY MOUTH  DAILY 90 tablet 3  . gabapentin (NEURONTIN) 400 MG capsule Take 1 capsule (400 mg total) by mouth 4 (four) times daily. 360 capsule 3  . isosorbide mononitrate (IMDUR) 60 MG 24 hr tablet TAKE 1 TABLET BY MOUTH  DAILY 90 tablet 3  . levothyroxine (SYNTHROID, LEVOTHROID) 75 MCG tablet Take 75 mcg by mouth daily.      . metoprolol tartrate (LOPRESSOR) 100 MG tablet TAKE 1 TABLET BY MOUTH TWO  TIMES DAILY 180 tablet 3  . mirabegron ER (MYRBETRIQ) 50 MG TB24 tablet Take 50 mg by mouth 2 (two) times daily.     . Multiple Vitamin (MULTIVITAMIN) tablet Take 1 tablet by mouth daily.      Marland Kitchen  NITROSTAT 0.4 MG SL tablet DISSOLVE ONE TABLET UNDER THE TONGUE EVERY 5 MINUTES AS NEEDED FOR CHEST PAIN.  DO NOT EXCEED A TOTAL OF 3 DOSES IN 15 MINUTES 25 tablet 1  . ranitidine (ZANTAC) 150 MG tablet Take 150 mg by mouth 2 (two) times daily.      . simvastatin (ZOCOR) 20 MG tablet Take 20 mg by mouth at bedtime.      . traZODone (DESYREL) 50 MG tablet Take 75 mg by mouth at bedtime. Takes one and one half tablet at bedtime.    . triamterene-hydrochlorothiazide (MAXZIDE-25) 37.5-25 MG tablet daily.    . vitamin B-12 (CYANOCOBALAMIN) 1000 MCG tablet Take 1,000 mcg by mouth daily.     No facility-administered medications prior to visit.     PAST MEDICAL HISTORY: Past Medical History:  Diagnosis Date  . Arthritis    "mild; in all  my joints" (04/27/2017)  . Benign essential tremor   . Coronary artery disease   . Cystitis with hematuria 04/26/2017  . Gait disorder   . H/O radioactive iodine thyroid ablation   . Hx of transient ischemic attack (TIA)   . Hyperlipidemia   . Hypertension    "not since MI in 2007" (04/27/2017)  . Hyperthyroidism    H/O radioactive iodine thyroid ablation [Z92.3]  . Hypothyroidism   . Lower extremity myoclonus   . Lumbosacral spondylosis   . Migraine    "I've had one in my lifetime" (04/27/2017)  . Mild obesity   . Myocardial infarction (HCC) 12/2005  . Myoclonus    Lower extremity involvement  . Obesity   . Pneumonia    "lots when I was a small child; not at all since" (04/27/2017)  . Seizures (HCC)    History is questionable  . Thyroid disease     PAST SURGICAL HISTORY: Past Surgical History:  Procedure Laterality Date  . ABDOMINAL HYSTERECTOMY  1972   for uterine fibroids/notes 11/10/2010  . ANTERIOR CERVICAL DECOMP/DISCECTOMY FUSION  03/2004   [name] 11/10/2010  . APPENDECTOMY    . BACK SURGERY    . BREAST BIOPSY Right 1996   nodule resected/notes 11/10/2010  . CARDIAC CATHETERIZATION  01/03/2006  . CATARACT EXTRACTION W/ INTRAOCULAR LENS  IMPLANT, BILATERAL Bilateral   . COLONOSCOPY  11/2003   [name] 11/10/2010  . ESOPHAGOGASTRODUODENOSCOPY  09/2006   [name] 11/10/2010  . LUMBAR DISC SURGERY  1990s X 1; 05/2003   "a nerve was pinching"; [name] 11/10/2010  . OVARY SURGERY Left 1999   benign tumor resected /notes 11/10/2010  . TONSILLECTOMY  ~ 1947    FAMILY HISTORY: Family History  Problem Relation Age of Onset  . Heart disease Father   . Heart attack Mother   . Stroke Sister   . Heart attack Sister   . Heart attack Brother        Possible  . Diabetes Sister     SOCIAL HISTORY: Social History   Socioeconomic History  . Marital status: Widowed    Spouse name: Not on file  . Number of children: 1  . Years of education: HS  . Highest education level:  Not on file  Occupational History  . Occupation: retired    Associate Professor: RETIRED  Social Needs  . Financial resource strain: Not on file  . Food insecurity:    Worry: Not on file    Inability: Not on file  . Transportation needs:    Medical: Not on file    Non-medical: Not on file  Tobacco Use  . Smoking status: Never Smoker  . Smokeless tobacco: Never Used  Substance and Sexual Activity  . Alcohol use: No  . Drug use: No  . Sexual activity: Never  Lifestyle  . Physical activity:    Days per week: Not on file    Minutes per session: Not on file  . Stress: Not on file  Relationships  . Social connections:    Talks on phone: Not on file    Gets together: Not on file    Attends religious service: Not on file    Active member of club or organization: Not on file    Attends meetings of clubs or organizations: Not on file    Relationship status: Not on file  . Intimate partner violence:    Fear of current or ex partner: Not on file    Emotionally abused: Not on file    Physically abused: Not on file    Forced sexual activity: Not on file  Other Topics Concern  . Not on file  Social History Narrative   Lives at [facility]   Patient is left handed, but has had to adapt to using her right hand lately.   Patient drinks 1/2 cup of caffeine each morning.      PHYSICAL EXAM  Vitals:   11/02/17 1042  BP: 99/63  Pulse: 68  Weight: 154 lb 3.2 oz (69.9 kg)  Height:  (1.499 m)   Body mass index is 31.14 kg/m.  Generalized: Well developed, in no acute distress   Neurological examination  Mentation: Alert oriented to time, place, history taking. Follows all commands speech and language fluent Cranial nerve II-XII: Pupils were equal round reactive to light. Extraocular movements were full, visual field were full on confrontational test. Facial sensation and strength were normal. Uvula tongue midline. Head turning and shoulder shrug  were normal and  symmetric.  Mild tremor noted in the neck Motor: The motor testing reveals 5 over 5 strength of all 4 extremities. Good symmetric motor tone is noted throughout.  Sensory: Sensory testing is intact to soft touch on all 4 extremities. No evidence of extinction is noted.  Coordination: Cerebellar testing reveals good finger-nose-finger and heel-to-shin bilaterally.  Gait and station: Patient uses a Rollator when ambulating.  She does well with a Rollator.  Tandem gait was not attempted. Reflexes: Deep tendon reflexes are symmetric and normal bilaterally.   DIAGNOSTIC DATA (LABS, IMAGING, TESTING) - I reviewed patient records, labs, notes, testing and imaging myself where available.  Lab Results  Component Value Date   WBC 7.2 04/26/2017   HGB 13.6 04/26/2017   HCT 41.8 04/26/2017   MCV 87.8 04/26/2017   PLT 140 (L) 04/26/2017      Component Value Date/Time   NA 142 04/28/2017 1116   K 3.4 (L) 04/28/2017 1116   CL 105 04/28/2017 1116   CO2 28 04/28/2017 1116   GLUCOSE 107 (H) 04/28/2017 1116   BUN 6 04/28/2017 1116   CREATININE 0.59 04/28/2017 1116   CALCIUM 8.8 (L) 04/28/2017 1116   PROT 6.9 07/24/2015 2341   ALBUMIN 3.9 07/24/2015 2341   AST 26 07/24/2015 2341   ALT 18 07/24/2015 2341   ALKPHOS 34 (L) 07/24/2015 2341   BILITOT 0.6 07/24/2015 2341   GFRNONAA >60 04/28/2017 1116   GFRAA >60 04/28/2017 1116    Lab Results  Component Value Date   TSH 1.400 04/28/2017      ASSESSMENT AND PLAN 84  y.o. year old female  has a past medical history of Arthritis, Benign essential tremor, Coronary artery disease, Cystitis with hematuria (04/26/2017), Gait disorder, H/O radioactive iodine thyroid ablation, transient ischemic attack (TIA), Hyperlipidemia, Hypertension, Hyperthyroidism, Hypothyroidism, Lower extremity myoclonus, Lumbosacral spondylosis, Migraine, Mild obesity, Myocardial infarction (HCC) (12/2005), Myoclonus, Obesity, Pneumonia, Seizures (HCC), and Thyroid disease. here  with :  1.  Essential tremor 2.  Gait abnormality  3.  Myoclonic jerks  Overall the patient has remained stable.  She will continue on gabapentin and baclofen.  She is encouraged to continue using her Rollator.  She is advised that if her symptoms worsen or she develops new symptoms she should let us know.  She will follow-up in 6 months or sooner if needed.     Butch Penny, MSN, NP-C 11/02/2017, 11:01 AM [facility] [address] [place], [place] [address] [phone]

## 2017-12-12 ENCOUNTER — Observation Stay (HOSPITAL_COMMUNITY)
Admission: EM | Admit: 2017-12-12 | Discharge: 2017-12-15 | Disposition: A | Discharge status: Home / Self Care | Payer: Medicare Other | Attending: Internal Medicine | Admitting: Internal Medicine

## 2017-12-12 ENCOUNTER — Emergency Department (HOSPITAL_COMMUNITY): Payer: Medicare Other

## 2017-12-12 ENCOUNTER — Encounter (HOSPITAL_COMMUNITY): Payer: Self-pay

## 2017-12-12 DIAGNOSIS — E039 Hypothyroidism, unspecified: Secondary | ICD-10-CM | POA: Insufficient documentation

## 2017-12-12 DIAGNOSIS — S0990XA Unspecified injury of head, initial encounter: Secondary | ICD-10-CM | POA: Diagnosis not present

## 2017-12-12 DIAGNOSIS — R0902 Hypoxemia: Secondary | ICD-10-CM | POA: Diagnosis not present

## 2017-12-12 DIAGNOSIS — Z8673 Personal history of transient ischemic attack (TIA), and cerebral infarction without residual deficits: Secondary | ICD-10-CM | POA: Diagnosis not present

## 2017-12-12 DIAGNOSIS — Y929 Unspecified place or not applicable: Secondary | ICD-10-CM | POA: Insufficient documentation

## 2017-12-12 DIAGNOSIS — S0101XA Laceration without foreign body of scalp, initial encounter: Secondary | ICD-10-CM | POA: Diagnosis not present

## 2017-12-12 DIAGNOSIS — Z7982 Long term (current) use of aspirin: Secondary | ICD-10-CM | POA: Insufficient documentation

## 2017-12-12 DIAGNOSIS — E876 Hypokalemia: Secondary | ICD-10-CM | POA: Insufficient documentation

## 2017-12-12 DIAGNOSIS — I10 Essential (primary) hypertension: Secondary | ICD-10-CM | POA: Diagnosis present

## 2017-12-12 DIAGNOSIS — G9340 Encephalopathy, unspecified: Secondary | ICD-10-CM | POA: Diagnosis not present

## 2017-12-12 DIAGNOSIS — Y939 Activity, unspecified: Secondary | ICD-10-CM | POA: Insufficient documentation

## 2017-12-12 DIAGNOSIS — S098XXA Other specified injuries of head, initial encounter: Secondary | ICD-10-CM | POA: Diagnosis not present

## 2017-12-12 DIAGNOSIS — Y999 Unspecified external cause status: Secondary | ICD-10-CM | POA: Diagnosis not present

## 2017-12-12 DIAGNOSIS — Z79899 Other long term (current) drug therapy: Secondary | ICD-10-CM | POA: Insufficient documentation

## 2017-12-12 DIAGNOSIS — I2510 Atherosclerotic heart disease of native coronary artery without angina pectoris: Secondary | ICD-10-CM | POA: Insufficient documentation

## 2017-12-12 DIAGNOSIS — W19XXXA Unspecified fall, initial encounter: Secondary | ICD-10-CM | POA: Insufficient documentation

## 2017-12-12 DIAGNOSIS — R410 Disorientation, unspecified: Secondary | ICD-10-CM | POA: Diagnosis not present

## 2017-12-12 DIAGNOSIS — S199XXA Unspecified injury of neck, initial encounter: Secondary | ICD-10-CM | POA: Diagnosis not present

## 2017-12-12 DIAGNOSIS — R296 Repeated falls: Secondary | ICD-10-CM | POA: Diagnosis present

## 2017-12-12 LAB — COMPREHENSIVE METABOLIC PANEL
ALK PHOS: 37 U/L — AB (ref 38–126)
ALT: 19 U/L (ref 14–54)
ANION GAP: 10 (ref 5–15)
AST: 28 U/L (ref 15–41)
Albumin: 3.8 g/dL (ref 3.5–5.0)
BUN: 12 mg/dL (ref 6–20)
CALCIUM: 10 mg/dL (ref 8.9–10.3)
CO2: 32 mmol/L (ref 22–32)
Chloride: 102 mmol/L (ref 101–111)
Creatinine, Ser: 0.77 mg/dL (ref 0.44–1.00)
GLUCOSE: 104 mg/dL — AB (ref 65–99)
Potassium: 3.2 mmol/L — ABNORMAL LOW (ref 3.5–5.1)
Sodium: 144 mmol/L (ref 135–145)
Total Bilirubin: 0.9 mg/dL (ref 0.3–1.2)
Total Protein: 6.4 g/dL — ABNORMAL LOW (ref 6.5–8.1)

## 2017-12-12 LAB — I-STAT TROPONIN, ED: Troponin i, poc: 0.01 ng/mL (ref 0.00–0.08)

## 2017-12-12 LAB — URINALYSIS, ROUTINE W REFLEX MICROSCOPIC
BILIRUBIN URINE: NEGATIVE
GLUCOSE, UA: NEGATIVE mg/dL
Hgb urine dipstick: NEGATIVE
KETONES UR: NEGATIVE mg/dL
Leukocytes, UA: NEGATIVE
NITRITE: NEGATIVE
PH: 7 (ref 5.0–8.0)
PROTEIN: NEGATIVE mg/dL
Specific Gravity, Urine: 1.01 (ref 1.005–1.030)

## 2017-12-12 LAB — CBC WITH DIFFERENTIAL/PLATELET
Basophils Absolute: 0 10*3/uL (ref 0.0–0.1)
Basophils Relative: 0 %
EOS PCT: 1 %
Eosinophils Absolute: 0.1 10*3/uL (ref 0.0–0.7)
HCT: 42.9 % (ref 36.0–46.0)
Hemoglobin: 13.9 g/dL (ref 12.0–15.0)
LYMPHS ABS: 1 10*3/uL (ref 0.7–4.0)
Lymphocytes Relative: 13 %
MCH: 28.9 pg (ref 26.0–34.0)
MCHC: 32.4 g/dL (ref 30.0–36.0)
MCV: 89.2 fL (ref 78.0–100.0)
MONO ABS: 0.5 10*3/uL (ref 0.1–1.0)
MONOS PCT: 6 %
NEUTROS ABS: 6.1 10*3/uL (ref 1.7–7.7)
Neutrophils Relative %: 80 %
PLATELETS: ADEQUATE 10*3/uL (ref 150–400)
RBC: 4.81 MIL/uL (ref 3.87–5.11)
RDW: 13.5 % (ref 11.5–15.5)
WBC: 7.7 10*3/uL (ref 4.0–10.5)

## 2017-12-12 LAB — PROTIME-INR
INR: 1
Prothrombin Time: 13.1 seconds (ref 11.4–15.2)

## 2017-12-12 NOTE — ED Notes (Addendum)
Pad behind pt's head changed, bleeding controlled, denies any pain at this time.

## 2017-12-12 NOTE — ED Notes (Signed)
Spoke with pt son who is vacationing in [place]. He reports she gets bladder infections every six months becoming incoherent and losing balance. He requests that the provider call him at anytime with information.

## 2017-12-12 NOTE — ED Notes (Signed)
Pt states "Somebody better take me home, its after 11, its too damn bad, send me home now."   Encouraged to put spo2 monitor back on.

## 2017-12-12 NOTE — ED Notes (Signed)
plz call son for an update:  Baker PieriniDavid Wyrick @ [phone]

## 2017-12-12 NOTE — ED Notes (Signed)
Patient transported to CT 

## 2017-12-12 NOTE — ED Notes (Signed)
Tech and RN cleaned head wound

## 2017-12-12 NOTE — ED Notes (Signed)
Per Lab, CBC clotted, new specimen to be collected and resent.

## 2017-12-12 NOTE — ED Triage Notes (Signed)
Pt arrived via [facility] from [facility] after an unwitnessed fall.  Son reports to EMS pt is usually A&Ox4.  UNknown last seen per EMS.  Posterior head lac, arrives in towel roll.  EMS states pt locked her self out of apartment and fell outside it, but blood was in pt's bathroom also.  Pt reports to EMS blood was from her husband (who passed a decade ago).

## 2017-12-12 NOTE — ED Provider Notes (Signed)
[facility] EMERGENCY DEPARTMENT Provider Note   CSN: [id number] Arrival date & time: 12/12/17  1747     History   Chief Complaint Chief Complaint  Patient presents with  . Fall    HPI Kimberly Bass is a 84 y.o. female.  84 year old female with prior history of CAD, hypertension, hyperlipidemia, MI, and TIA who presents with complaint of fall with head injury.  Patient reportedly resides at an independent living situation.  Patient had a unwitnessed fall.  She complains of a injury to the posterior scalp.  She denies chest pain or shortness of breath.  She denies focal weakness.  She is unclear as exactly when she had her fall.  Patient appears to be mildly confused upon my questioning.  Additional history is obtained from her son and from the review of the medical record.  The history is provided by the patient, medical records and a relative.  Fall  This is a new problem. The current episode started 3 to 5 hours ago. The problem occurs constantly. The problem has not changed since onset.Nothing aggravates the symptoms. Nothing relieves the symptoms. She has tried nothing for the symptoms.    Past Medical History:  Diagnosis Date  . Arthritis    "mild; in all my joints" (04/27/2017)  . Benign essential tremor   . Coronary artery disease   . Cystitis with hematuria 04/26/2017  . Gait disorder   . H/O radioactive iodine thyroid ablation   . Hx of transient ischemic attack (TIA)   . Hyperlipidemia   . Hypertension    "not since MI in 2007" (04/27/2017)  . Hyperthyroidism    H/O radioactive iodine thyroid ablation [Z92.3]  . Hypothyroidism   . Lower extremity myoclonus   . Lumbosacral spondylosis   . Migraine    "I've had one in my lifetime" (04/27/2017)  . Mild obesity   . Myocardial infarction (HCC) 12/2005  . Myoclonus    Lower extremity involvement  . Obesity   . Pneumonia    "lots when I was a small child; not at all since" (04/27/2017)   . Seizures (HCC)    History is questionable  . Thyroid disease     Patient Active Problem List   Diagnosis Date Noted  . Hyperlipidemia 05/05/2017  . UTI (urinary tract infection) 04/27/2017  . Hypotension 04/27/2017  . Tremor, essential 11/03/2015  . 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 Abnormality of gait 08/15/2012  . Essential and other specified forms of tremor 08/15/2012  . Degeneration of lumbar or lumbosacral intervertebral disc 08/15/2012  . Hx of low back pain 08/15/2012  . Pain of left leg 08/15/2012  . CAD (coronary artery disease) 12/23/2010  . Palpitations 12/23/2010  . Hx of transient ischemic attack (TIA)   . CONSTIPATION 02/27/2009  . PERSONAL HX COLONIC POLYPS 02/27/2009    Past Surgical History:  Procedure Laterality Date  . ABDOMINAL HYSTERECTOMY  1972   for uterine fibroids/notes 11/10/2010  . ANTERIOR CERVICAL DECOMP/DISCECTOMY FUSION  03/2004   [name] 11/10/2010  . APPENDECTOMY    . BACK SURGERY    . BREAST BIOPSY Right 1996   nodule resected/notes 11/10/2010  . CARDIAC CATHETERIZATION  01/03/2006  . CATARACT EXTRACTION W/ INTRAOCULAR LENS  IMPLANT, BILATERAL Bilateral   . COLONOSCOPY  11/2003   [name] 11/10/2010  . ESOPHAGOGASTRODUODENOSCOPY  09/2006   [name] 11/10/2010  . LUMBAR DISC SURGERY  1990s X 1; 05/2003   "a nerve was pinching"; [name] 11/10/2010  . OVARY SURGERY Left  1999   benign tumor resected /notes 11/10/2010  . TONSILLECTOMY  ~ 1947     OB History   None      Home Medications    Prior to Admission medications   Medication Sig Start Date End Date Taking? Authorizing Provider  acetaminophen (TYLENOL ARTHRITIS PAIN) 650 MG CR tablet Take 650 mg by mouth 2 (two) times daily.     [provider]  amLODipine (NORVASC) 5 MG tablet Take 5 mg by mouth daily.  08/18/17   [provider]  aspirin 81 MG tablet Take 81 mg by mouth daily.      [provider]  baclofen (LIORESAL) 20 MG tablet TAKE 1 AND 1/2 TABLETS BY  MOUTH  IN THE MORNING AND IN THE EVENING AND 1 TABLET BY MOUTH AT NOON 11/02/17   Butch Penny, NP  Calcium Carbonate-Vitamin D (CALCIUM + D PO) Take 1 tablet by mouth 2 (two) times daily.     [provider]  clopidogrel (PLAVIX) 75 MG tablet TAKE 1 TABLET BY MOUTH  DAILY 07/22/17   Nahser, Deloris Ping, MD  gabapentin (NEURONTIN) 400 MG capsule Take 1 capsule (400 mg total) by mouth 4 (four) times daily. 07/24/17   York Spaniel, MD  isosorbide mononitrate (IMDUR) 60 MG 24 hr tablet TAKE 1 TABLET BY MOUTH  DAILY 07/14/17   Nahser, Deloris Ping, MD  levothyroxine (SYNTHROID, LEVOTHROID) 75 MCG tablet Take 75 mcg by mouth daily.      [provider]  metoprolol tartrate (LOPRESSOR) 100 MG tablet TAKE 1 TABLET BY MOUTH TWO  TIMES DAILY 07/14/17   Nahser, Deloris Ping, MD  mirabegron ER (MYRBETRIQ) 50 MG TB24 tablet Take 50 mg by mouth 2 (two) times daily.     [provider]  Multiple Vitamin (MULTIVITAMIN) tablet Take 1 tablet by mouth daily.      [provider]  NITROSTAT 0.4 MG SL tablet DISSOLVE ONE TABLET UNDER THE TONGUE EVERY 5 MINUTES AS NEEDED FOR CHEST PAIN.  DO NOT EXCEED A TOTAL OF 3 DOSES IN 15 MINUTES 10/11/12   Nahser, Deloris Ping, MD  ranitidine (ZANTAC) 150 MG tablet Take 150 mg by mouth 2 (two) times daily.      [provider]  simvastatin (ZOCOR) 20 MG tablet Take 20 mg by mouth every evening.     [provider]  traZODone (DESYREL) 50 MG tablet Take 75 mg by mouth at bedtime.     [provider]  triamterene-hydrochlorothiazide (MAXZIDE-25) 37.5-25 MG tablet Take 0.5-1 tablets by mouth daily.  09/19/17   [provider]  vitamin B-12 (CYANOCOBALAMIN) 1000 MCG tablet Take 1,000 mcg by mouth daily.    [provider]    Family History Family History  Problem Relation Age of Onset  . Heart disease Father   . Heart attack Mother   . Stroke Sister   . Heart attack Sister   . Heart attack Brother        Possible  .  Diabetes Sister     Social History Social History   Tobacco Use  . Smoking status: Never Smoker  . Smokeless tobacco: Never Used  Substance Use Topics  . Alcohol use: No  . Drug use: No     Allergies   Codeine; Meperidine hcl; Morphine; and Penicillins   Review of Systems Review of Systems  Constitutional:       Fall    Psychiatric/Behavioral: Positive for confusion.  All other systems reviewed and  are negative.    Physical Exam Updated Vital Signs BP 126/65   Pulse 72   Temp 98.2 F (36.8 C) (Oral)   Resp 19   SpO2 92%   Physical Exam  Constitutional: She is oriented to person, place, and time. She appears well-developed and well-nourished. No distress.  HENT:  Head: Normocephalic.  Mouth/Throat: Oropharynx is clear and moist.  Superficial abrasion to posterior scalp  No active bleeding   Eyes: Pupils are equal, round, and reactive to light. Conjunctivae and EOM are normal.  Neck: Normal range of motion. Neck supple.  Cardiovascular: Normal rate, regular rhythm and normal heart sounds.  Pulmonary/Chest: Effort normal and breath sounds normal. No respiratory distress.  Abdominal: Soft. She exhibits no distension. There is no tenderness.  Musculoskeletal: Normal range of motion. She exhibits no edema or deformity.  Neurological: She is alert and oriented to person, place, and time.  Skin: Skin is warm and dry.  Psychiatric: She has a normal mood and affect.  Nursing note and vitals reviewed.    ED Treatments / Results  Labs (all labs ordered are listed, but only abnormal results are displayed) Labs Reviewed  COMPREHENSIVE METABOLIC PANEL - Abnormal; Notable for the following components:      Result Value   Potassium 3.2 (*)    Glucose, Bld 104 (*)    Total Protein 6.4 (*)    Alkaline Phosphatase 37 (*)    All other components within normal limits  URINALYSIS, ROUTINE W REFLEX MICROSCOPIC  CBC WITH DIFFERENTIAL/PLATELET  PROTIME-INR  CBC WITH  DIFFERENTIAL/PLATELET  I-STAT TROPONIN, ED    EKG EKG Interpretation  Date/Time:  Monday December 12 2017 17:52:03 EDT Ventricular Rate:  70 PR Interval:    QRS Duration: 109 QT Interval:  442 QTC Calculation: 477 R Axis:   -43 Text Interpretation:  Sinus rhythm Prolonged PR interval Left axis deviation Anterior infarct, old Baseline wander in lead(s) II III aVF Confirmed by Kristine Royal [phone]) on 12/12/2017 5:58:07 PM   Radiology Ct Head Wo Contrast  Result Date: 12/12/2017 CLINICAL DATA:  84 year old female with unwitnessed fall. Laceration posterior head. Initial encounter. EXAM: CT HEAD WITHOUT CONTRAST CT CERVICAL SPINE WITHOUT CONTRAST TECHNIQUE: Multidetector CT imaging of the head and cervical spine was performed following the standard protocol without intravenous contrast. Multiplanar CT image reconstructions of the cervical spine were also generated. COMPARISON:  05/16/2017 CT head and cervical spine. FINDINGS: CT HEAD FINDINGS Brain: No intracranial hemorrhage or CT evidence of large acute infarct. Chronic microvascular changes. Mild global atrophy. No intracranial mass lesion noted on this unenhanced exam. Vascular: Vascular calcifications. Skull: No skull fracture Sinuses/Orbits: No acute orbital abnormality. Mild exophthalmos. Minimal mucosal thickening ethmoid sinus air cells. Polypoid opacification right maxillary sinus. Mastoid air cells and middle ear cavities are clear. Other: Right occipital small scalp laceration. Degenerative changes temporomandibular joint. CT CERVICAL SPINE FINDINGS Alignment: Mild rotation of the head and C1 ring upon C2. Skull base and vertebrae: No cervical spine fracture. Prior fusion C5-C7. Soft tissues and spinal canal: No abnormal prevertebral soft tissue swelling. Transverse ligament hypertrophy calcification. Disc levels: Cervical spondylotic changes with spinal stenosis and cord flattening C3-4 through C6-7. Upper chest: No worrisome lung apical  lesion. Other: Carotid bifurcation calcifications. IMPRESSION: No skull fracture or intracranial hemorrhage. Laceration right occipital region. Chronic microvascular changes. Atrophy. Rotation of the head and C1 ring upon C2 may be related to head positioning. No cervical spine fracture. Prior fusion C5-C7. Cervical spondylotic changes with spinal stenosis and  cord flattening C3-4 through C6-7. Electronically Signed   By: Lacy DuverneySteven  Olson M.D.   On: 12/12/2017 19:44   Ct Cervical Spine Wo Contrast  Result Date: 12/12/2017 CLINICAL DATA:  84 year old female with unwitnessed fall. Laceration posterior head. Initial encounter. EXAM: CT HEAD WITHOUT CONTRAST CT CERVICAL SPINE WITHOUT CONTRAST TECHNIQUE: Multidetector CT imaging of the head and cervical spine was performed following the standard protocol without intravenous contrast. Multiplanar CT image reconstructions of the cervical spine were also generated. COMPARISON:  05/16/2017 CT head and cervical spine. FINDINGS: CT HEAD FINDINGS Brain: No intracranial hemorrhage or CT evidence of large acute infarct. Chronic microvascular changes. Mild global atrophy. No intracranial mass lesion noted on this unenhanced exam. Vascular: Vascular calcifications. Skull: No skull fracture Sinuses/Orbits: No acute orbital abnormality. Mild exophthalmos. Minimal mucosal thickening ethmoid sinus air cells. Polypoid opacification right maxillary sinus. Mastoid air cells and middle ear cavities are clear. Other: Right occipital small scalp laceration. Degenerative changes temporomandibular joint. CT CERVICAL SPINE FINDINGS Alignment: Mild rotation of the head and C1 ring upon C2. Skull base and vertebrae: No cervical spine fracture. Prior fusion C5-C7. Soft tissues and spinal canal: No abnormal prevertebral soft tissue swelling. Transverse ligament hypertrophy calcification. Disc levels: Cervical spondylotic changes with spinal stenosis and cord flattening C3-4 through C6-7. Upper  chest: No worrisome lung apical lesion. Other: Carotid bifurcation calcifications. IMPRESSION: No skull fracture or intracranial hemorrhage. Laceration right occipital region. Chronic microvascular changes. Atrophy. Rotation of the head and C1 ring upon C2 may be related to head positioning. No cervical spine fracture. Prior fusion C5-C7. Cervical spondylotic changes with spinal stenosis and cord flattening C3-4 through C6-7. Electronically Signed   By: Lacy DuverneySteven  Olson M.D.   On: 12/12/2017 19:44    Procedures Procedures (including critical care time)  Medications Ordered in ED Medications - No data to display   Initial Impression / Assessment and Plan / ED Course  I have reviewed the triage vital signs and the nursing notes.  Pertinent labs & imaging results that were available during my care of the patient were reviewed by me and considered in my medical decision making (see chart for details).      Case discussed with Bunnie DominoSon - David Wyrick - [phone] -Mr. Linna DarnerWyrick is vacationing currently in [place].  He is the power of attorney for this patient.  He does not feel the patient would be safe returning to an independent living situation tonight after the recent fall and apparent change in her mental status.  Son reports that the patient is more confused than her baseline.   MDM  Screen complete  Patient is presenting for evaluation following fall (unwitnessed). It is a little unclear as to her actual baseline mental status.  Workup tonight is on the whole reassuring.   After discussion with son, it appears that the patient may not be safe to immediately return to an independent living situation.    Will consult with the hospitalist (Opyd) for observation overnight.  Final Clinical Impressions(s) / ED Diagnoses   Final diagnoses:  Fall, initial encounter  Injury of head, initial encounter    ED Discharge Orders    None       Wynetta FinesMessick, Peter C, MD 12/12/17 2338

## 2017-12-13 DIAGNOSIS — E039 Hypothyroidism, unspecified: Secondary | ICD-10-CM

## 2017-12-13 DIAGNOSIS — G9340 Encephalopathy, unspecified: Secondary | ICD-10-CM | POA: Diagnosis not present

## 2017-12-13 DIAGNOSIS — Z8673 Personal history of transient ischemic attack (TIA), and cerebral infarction without residual deficits: Secondary | ICD-10-CM | POA: Diagnosis not present

## 2017-12-13 DIAGNOSIS — I10 Essential (primary) hypertension: Secondary | ICD-10-CM | POA: Diagnosis not present

## 2017-12-13 DIAGNOSIS — I2510 Atherosclerotic heart disease of native coronary artery without angina pectoris: Secondary | ICD-10-CM | POA: Diagnosis not present

## 2017-12-13 DIAGNOSIS — W19XXXA Unspecified fall, initial encounter: Secondary | ICD-10-CM | POA: Diagnosis not present

## 2017-12-13 DIAGNOSIS — E876 Hypokalemia: Secondary | ICD-10-CM | POA: Diagnosis not present

## 2017-12-13 LAB — VITAMIN B12: Vitamin B-12: 1900 pg/mL — ABNORMAL HIGH (ref 180–914)

## 2017-12-13 LAB — HIV ANTIBODY (ROUTINE TESTING W REFLEX): HIV Screen 4th Generation wRfx: NONREACTIVE

## 2017-12-13 LAB — BASIC METABOLIC PANEL
ANION GAP: 11 (ref 5–15)
BUN: 9 mg/dL (ref 6–20)
CHLORIDE: 99 mmol/L — AB (ref 101–111)
CO2: 33 mmol/L — ABNORMAL HIGH (ref 22–32)
Calcium: 10.3 mg/dL (ref 8.9–10.3)
Creatinine, Ser: 0.71 mg/dL (ref 0.44–1.00)
Glucose, Bld: 112 mg/dL — ABNORMAL HIGH (ref 65–99)
POTASSIUM: 3.2 mmol/L — AB (ref 3.5–5.1)
SODIUM: 143 mmol/L (ref 135–145)

## 2017-12-13 LAB — CBC WITH DIFFERENTIAL/PLATELET
Abs Immature Granulocytes: 0 10*3/uL (ref 0.0–0.1)
Basophils Absolute: 0 10*3/uL (ref 0.0–0.1)
Basophils Relative: 0 %
EOS PCT: 2 %
Eosinophils Absolute: 0.1 10*3/uL (ref 0.0–0.7)
HEMATOCRIT: 47 % — AB (ref 36.0–46.0)
HEMOGLOBIN: 15.3 g/dL — AB (ref 12.0–15.0)
IMMATURE GRANULOCYTES: 0 %
LYMPHS ABS: 1.2 10*3/uL (ref 0.7–4.0)
LYMPHS PCT: 14 %
MCH: 29 pg (ref 26.0–34.0)
MCHC: 32.6 g/dL (ref 30.0–36.0)
MCV: 89 fL (ref 78.0–100.0)
Monocytes Absolute: 0.6 10*3/uL (ref 0.1–1.0)
Monocytes Relative: 8 %
NEUTROS ABS: 6.4 10*3/uL (ref 1.7–7.7)
NEUTROS PCT: 76 %
Platelets: 160 10*3/uL (ref 150–400)
RBC: 5.28 MIL/uL — AB (ref 3.87–5.11)
RDW: 13.5 % (ref 11.5–15.5)
WBC: 8.4 10*3/uL (ref 4.0–10.5)

## 2017-12-13 LAB — GLUCOSE, CAPILLARY
GLUCOSE-CAPILLARY: 99 mg/dL (ref 65–99)
Glucose-Capillary: 104 mg/dL — ABNORMAL HIGH (ref 65–99)

## 2017-12-13 LAB — TSH: TSH: 2.114 u[IU]/mL (ref 0.350–4.500)

## 2017-12-13 LAB — RPR: RPR: NONREACTIVE

## 2017-12-13 LAB — MAGNESIUM: Magnesium: 1.5 mg/dL — ABNORMAL LOW (ref 1.7–2.4)

## 2017-12-13 LAB — AMMONIA: Ammonia: 10 umol/L (ref 9–35)

## 2017-12-13 MED ORDER — FAMOTIDINE 20 MG PO TABS
20.0000 mg | ORAL_TABLET | Freq: Two times a day (BID) | ORAL | Status: DC
  Administered 2017-12-13 – 2017-12-15 (×5): 20 mg via ORAL
  Filled 2017-12-13 (×7): qty 1

## 2017-12-13 MED ORDER — METOPROLOL TARTRATE 100 MG PO TABS
100.0000 mg | ORAL_TABLET | Freq: Two times a day (BID) | ORAL | Status: DC
  Administered 2017-12-13 – 2017-12-15 (×5): 100 mg via ORAL
  Filled 2017-12-13 (×2): qty 2
  Filled 2017-12-13 (×4): qty 1
  Filled 2017-12-13 (×3): qty 2
  Filled 2017-12-13 (×2): qty 1

## 2017-12-13 MED ORDER — MIRABEGRON ER 50 MG PO TB24
50.0000 mg | ORAL_TABLET | Freq: Two times a day (BID) | ORAL | Status: DC
  Administered 2017-12-13 – 2017-12-15 (×5): 50 mg via ORAL
  Filled 2017-12-13 (×6): qty 1

## 2017-12-13 MED ORDER — ADULT MULTIVITAMIN W/MINERALS CH
1.0000 | ORAL_TABLET | Freq: Every day | ORAL | Status: DC
  Administered 2017-12-13 – 2017-12-15 (×3): 1 via ORAL
  Filled 2017-12-13 (×3): qty 1

## 2017-12-13 MED ORDER — TRAMADOL HCL 50 MG PO TABS
50.0000 mg | ORAL_TABLET | Freq: Four times a day (QID) | ORAL | Status: DC | PRN
  Filled 2017-12-13: qty 1

## 2017-12-13 MED ORDER — SENNOSIDES-DOCUSATE SODIUM 8.6-50 MG PO TABS: 1.0000 | ORAL_TABLET | Freq: Every evening | ORAL | Status: DC | PRN

## 2017-12-13 MED ORDER — VITAMIN B-12 1000 MCG PO TABS
1000.0000 ug | ORAL_TABLET | Freq: Every day | ORAL | Status: DC
  Administered 2017-12-13 – 2017-12-15 (×3): 1000 ug via ORAL
  Filled 2017-12-13 (×3): qty 1

## 2017-12-13 MED ORDER — ISOSORBIDE MONONITRATE ER 60 MG PO TB24
60.0000 mg | ORAL_TABLET | Freq: Every day | ORAL | Status: DC
  Administered 2017-12-13 – 2017-12-15 (×3): 60 mg via ORAL
  Filled 2017-12-13 (×3): qty 1

## 2017-12-13 MED ORDER — SODIUM CHLORIDE 0.9% FLUSH
3.0000 mL | Freq: Two times a day (BID) | INTRAVENOUS | Status: DC
  Administered 2017-12-13 (×2): 3 mL via INTRAVENOUS
  Administered 2017-12-14: 10 mL via INTRAVENOUS
  Administered 2017-12-14 – 2017-12-15 (×2): 3 mL via INTRAVENOUS

## 2017-12-13 MED ORDER — ACETAMINOPHEN 325 MG PO TABS: 650.0000 mg | ORAL_TABLET | Freq: Four times a day (QID) | ORAL | Status: DC | PRN

## 2017-12-13 MED ORDER — LEVOTHYROXINE SODIUM 75 MCG PO TABS
75.0000 ug | ORAL_TABLET | Freq: Every day | ORAL | Status: DC
  Administered 2017-12-13 – 2017-12-15 (×3): 75 ug via ORAL
  Filled 2017-12-13 (×3): qty 1

## 2017-12-13 MED ORDER — MAGNESIUM SULFATE 4 GM/100ML IV SOLN
4.0000 g | Freq: Once | INTRAVENOUS | Status: AC
  Administered 2017-12-13: 4 g via INTRAVENOUS
  Filled 2017-12-13: qty 100

## 2017-12-13 MED ORDER — TRAZODONE HCL 50 MG PO TABS
75.0000 mg | ORAL_TABLET | Freq: Every day | ORAL | Status: DC
  Administered 2017-12-13 – 2017-12-14 (×2): 75 mg via ORAL
  Filled 2017-12-13 (×2): qty 2

## 2017-12-13 MED ORDER — MAGNESIUM SULFATE IN D5W 1-5 GM/100ML-% IV SOLN
1.0000 g | Freq: Once | INTRAVENOUS | Status: AC
  Administered 2017-12-13: 1 g via INTRAVENOUS
  Filled 2017-12-13: qty 100

## 2017-12-13 MED ORDER — ONDANSETRON HCL 4 MG/2ML IJ SOLN: 4.0000 mg | Freq: Four times a day (QID) | INTRAMUSCULAR | Status: DC | PRN

## 2017-12-13 MED ORDER — ONDANSETRON HCL 4 MG PO TABS: 4.0000 mg | ORAL_TABLET | Freq: Four times a day (QID) | ORAL | Status: DC | PRN

## 2017-12-13 MED ORDER — POTASSIUM CHLORIDE CRYS ER 20 MEQ PO TBCR
20.0000 meq | EXTENDED_RELEASE_TABLET | Freq: Once | ORAL | Status: AC
  Administered 2017-12-13: 20 meq via ORAL
  Filled 2017-12-13: qty 1

## 2017-12-13 MED ORDER — POTASSIUM CHLORIDE CRYS ER 20 MEQ PO TBCR
40.0000 meq | EXTENDED_RELEASE_TABLET | ORAL | Status: AC
  Administered 2017-12-13: 20 meq via ORAL
  Administered 2017-12-13: 40 meq via ORAL
  Filled 2017-12-13 (×3): qty 2

## 2017-12-13 MED ORDER — CLOPIDOGREL BISULFATE 75 MG PO TABS
75.0000 mg | ORAL_TABLET | Freq: Every day | ORAL | Status: DC
  Administered 2017-12-13 – 2017-12-15 (×3): 75 mg via ORAL
  Filled 2017-12-13 (×3): qty 1

## 2017-12-13 MED ORDER — ASPIRIN 81 MG PO CHEW
81.0000 mg | CHEWABLE_TABLET | Freq: Every day | ORAL | Status: DC
  Administered 2017-12-13 – 2017-12-15 (×3): 81 mg via ORAL
  Filled 2017-12-13 (×3): qty 1

## 2017-12-13 MED ORDER — AMLODIPINE BESYLATE 5 MG PO TABS
5.0000 mg | ORAL_TABLET | Freq: Every day | ORAL | Status: DC
  Administered 2017-12-13 – 2017-12-15 (×3): 5 mg via ORAL
  Filled 2017-12-13 (×3): qty 1

## 2017-12-13 MED ORDER — ACETAMINOPHEN 650 MG RE SUPP: 650.0000 mg | Freq: Four times a day (QID) | RECTAL | Status: DC | PRN

## 2017-12-13 MED ORDER — BISACODYL 5 MG PO TBEC
5.0000 mg | DELAYED_RELEASE_TABLET | Freq: Every day | ORAL | Status: DC | PRN
  Administered 2017-12-15: 5 mg via ORAL
  Filled 2017-12-13: qty 1

## 2017-12-13 MED ORDER — POTASSIUM CHLORIDE IN NACL 40-0.9 MEQ/L-% IV SOLN
INTRAVENOUS | Status: AC
  Administered 2017-12-13: 75 mL/h via INTRAVENOUS
  Filled 2017-12-13: qty 1000

## 2017-12-13 MED ORDER — SIMVASTATIN 20 MG PO TABS
20.0000 mg | ORAL_TABLET | Freq: Every evening | ORAL | Status: DC
  Administered 2017-12-13 – 2017-12-14 (×2): 20 mg via ORAL
  Filled 2017-12-13 (×2): qty 1

## 2017-12-13 NOTE — Care Management Note (Signed)
Case Management Note  Patient Details  Name: Kimberly Bass MRN: [id number] Date of Birth: 10/19/1925  Subjective/Objective:        Pt in with acute encephalopathy. She is from [facility].             Action/Plan: PT recommending SNF. Awaiting OT eval. CM following for d/c disposition.   Expected Discharge Date:                  Expected Discharge Plan:  Skilled Nursing Facility  In-House Referral:  Clinical Social Work  Discharge planning Services     Post Acute Care Choice:    Choice offered to:     DME Arranged:    DME Agency:     HH Arranged:    HH Agency:     Status of Service:  In process, will continue to follow  If discussed at Long Length of Stay Meetings, dates discussed:    Additional Comments:  Kermit BaloKelli F Moises Terpstra, RN 12/13/2017, 12:13 PM

## 2017-12-13 NOTE — Evaluation (Signed)
Physical Therapy Evaluation Patient Details Name: Kimberly Bass MRN: [id number] DOB: [birth year] Today's Date: 12/13/2017   History of Present Illness  Patient is a 84 y/o female presenting to the ED with confusion and fall. Admitted with Acute encephalopathy. Noncontrast head CT is notable for right occipital laceration, but no skull fracture or hemorrhage.  Cervical spine CT is negative for fracture. Patient with a PMH significant for hypertension, hypothyroidism, coronary artery disease, and history of TIA.  Clinical Impression  Kimberly Bass is a very pleasant 84 y/o female admitted with the above listed diagnosis. Prior to admission patient lived alone at ALF where she was Mod I with mobility with rollator. Son and other caregivers providing assist for housecleaning and other IADLs. Patient today requiring Min A/Min guard for transfers and mobility for safety and RW management with noted balance deficits. PT currently recommending short term SNF stay to progress independence with functional mobility. PT to continue to follow acutely to maximize mobility and safety.     Follow Up Recommendations SNF;Supervision/Assistance - 24 hour    Equipment Recommendations  None recommended by PT    Recommendations for Other Services       Precautions / Restrictions Precautions Precautions: Fall Restrictions Weight Bearing Restrictions: No      Mobility  Bed Mobility Overal bed mobility: Needs Assistance Bed Mobility: Sit to Supine       Sit to supine: Min assist   General bed mobility comments: for LE management and safety  Transfers Overall transfer level: Needs assistance Equipment used: Rolling walker (2 wheeled) Transfers: Sit to/from [employer] Sit to Stand: Min guard Stand pivot transfers: Min guard;Min assist       General transfer comment: for RW management and safety  Ambulation/Gait Ambulation/Gait assistance: Min guard Gait Distance (Feet): 20  Feet Assistive device: Rolling walker (2 wheeled) Gait Pattern/deviations: Step-to pattern;Decreased stride length;Trunk flexed;Wide base of support Gait velocity: decreased   General Gait Details: unsteady gait pattern  Stairs            Wheelchair Mobility    Modified Rankin (Stroke Patients Only)       Balance Overall balance assessment: Needs assistance Sitting-balance support: Feet supported Sitting balance-Leahy Scale: Fair     Standing balance support: Bilateral upper extremity supported;During functional activity Standing balance-Leahy Scale: Poor                               Pertinent Vitals/Pain Pain Assessment: No/denies pain    Home Living Family/patient expects to be discharged to:: Private residence Living Arrangements: Alone Available Help at Discharge: Neighbor;Available 24 hours/day Type of Home: Assisted living Home Access: Elevator     Home Layout: One level Home Equipment: Walker - 4 wheels;Grab bars - tub/shower      Prior Function Level of Independence: Independent with assistive device(s)         Comments: uses rollator for all mobility wihtin the home and community; family brings groceries, only does microwavable meals, has cleaning lady 1x/month for house cleaning and 2x/month for laundry     Hand Dominance   Dominant Hand: Right    Extremity/Trunk Assessment        Lower Extremity Assessment Lower Extremity Assessment: Generalized weakness       Communication   Communication: No difficulties  Cognition Arousal/Alertness: Awake/alert Behavior During Therapy: WFL for tasks assessed/performed Overall Cognitive Status: No family/caregiver present to determine baseline cognitive functioning  General Comments: AxO x 4 - able to participate in normal conversation, but does need redirection intermittently      General Comments      Exercises      Assessment/Plan    PT Assessment Patient needs continued PT services  PT Problem List Decreased strength;Decreased activity tolerance;Decreased balance;Decreased mobility;Decreased knowledge of use of DME;Decreased safety awareness       PT Treatment Interventions DME instruction;Gait training;Functional mobility training;Therapeutic activities;Therapeutic exercise;Balance training;Patient/family education    PT Goals (Current goals can be found in the Care Plan section)  Acute Rehab PT Goals Patient Stated Goal: none stated PT Goal Formulation: With patient Time For Goal Achievement: 12/27/17 Potential to Achieve Goals: Good    Frequency Min 3X/week(to progress towards home)   Barriers to discharge        Co-evaluation               AM-PAC PT "6 Clicks" Daily Activity  Outcome Measure Difficulty turning over in bed (including adjusting bedclothes, sheets and blankets)?: A Lot Difficulty moving from lying on back to sitting on the side of the bed? : Unable Difficulty sitting down on and standing up from a chair with arms (e.g., wheelchair, bedside commode, etc,.)?: Unable Help needed moving to and from a bed to chair (including a wheelchair)?: A Little Help needed walking in hospital room?: A Little Help needed climbing 3-5 steps with a railing? : A Lot 6 Click Score: 12    End of Session Equipment Utilized During Treatment: Gait belt Activity Tolerance: Patient tolerated treatment well Patient left: in bed;with call bell/phone within reach;with bed alarm set Nurse Communication: Mobility status PT Visit Diagnosis: Unsteadiness on feet (R26.81);Other abnormalities of gait and mobility (R26.89);History of falling (Z91.81);Muscle weakness (generalized) (M62.81)    Time: [id number] PT Time Calculation (min) (ACUTE ONLY): 37 min   Charges:   PT Evaluation $PT Eval Moderate Complexity: 1 Mod PT Treatments $Therapeutic Activity: 8-22 mins   PT G Codes:         Kipp LaurenceStephanie R Marijo Quizon, PT, DPT 12/13/17 11:24 AM

## 2017-12-13 NOTE — Progress Notes (Signed)
   12/13/17 1628  Vitals  Temp 98.1 F (36.7 C)  Temp Source Oral  BP 125/63  BP Location Left Arm  BP Method Automatic  Patient Position (if appropriate) Lying  Pulse Rate 72  Pulse Rate Source Dinamap  Resp 18  Orthostatic Lying   BP- Lying 125/63  Pulse- Lying 72  Orthostatic Sitting  BP- Sitting 142/75  Pulse- Sitting 76  Orthostatic Standing at 0 minutes  BP- Standing at 0 minutes 153/80  Pulse- Standing at 0 minutes 83  Orthostatic Standing at 3 minutes  BP- Standing at 3 minutes 153/81  Pulse- Standing at 3 minutes 81  Oxygen Therapy  SpO2 95 %  O2 Device Room Air      Orthostatic VS as above. MD notified.

## 2017-12-13 NOTE — H&P (Signed)
History and Physical    Kimberly BrownieJeanneane H Pavon WUJ:[id number] DOB: 09/25/1925 DOA: 12/12/2017  PCP: Merri BrunettePharr, Walter, MD   Patient coming from: ALF   Chief Complaint: Confusion, unwitnessed fall   HPI: Kimberly Bass is a 84 y.o. female with medical history significant for hypertension, hypothyroidism, coronary artery disease, and history of TIA, now presenting to the emergency department with confusion and an unwitnessed fall.  Patient resides at an ALF and is typically fully oriented per report of family.  She was noted to have fallen at her facility today with bleeding from her right occiput.  She was also found to be confused and EMS was called out.  Patient states that she remembers falling, but has difficulty explaining the circumstances, perseverating over having unintentionally locked herself out of her room today.  She denies chest pain or palpitations and denies headache, change in vision or hearing, or focal numbness or weakness.  She denies any shortness of breath, abdominal pain, fevers, or chills.  She reports being hungry and thirsty.  ED Course: Upon arrival to the ED, patient is found to be afebrile, saturating low 90s on room air, and with vitals otherwise normal.  EKG features a sinus rhythm with first-degree AV nodal block.  Noncontrast head CT is notable for right occipital laceration, but no skull fracture or hemorrhage.  Cervical spine CT is negative for fracture.  Chemistry panel features a potassium 3.2, CBC is unremarkable, INR is normal, and urinalysis unremarkable.  Patient remains alert in the ED, but confused.  She is hemodynamically stable and in no apparent respiratory distress, and will be observed for ongoing evaluation and management.  Review of Systems:  All other systems reviewed and apart from HPI, are negative.  Past Medical History:  Diagnosis Date  . Arthritis    "mild; in all my joints" (04/27/2017)  . Benign essential tremor   . Coronary artery disease    . Cystitis with hematuria 04/26/2017  . Gait disorder   . H/O radioactive iodine thyroid ablation   . Hx of transient ischemic attack (TIA)   . Hyperlipidemia   . Hypertension    "not since MI in 2007" (04/27/2017)  . Hyperthyroidism    H/O radioactive iodine thyroid ablation [Z92.3]  . Hypothyroidism   . Lower extremity myoclonus   . Lumbosacral spondylosis   . Migraine    "I've had one in my lifetime" (04/27/2017)  . Mild obesity   . Myocardial infarction (HCC) 12/2005  . Myoclonus    Lower extremity involvement  . Obesity   . Pneumonia    "lots when I was a small child; not at all since" (04/27/2017)  . Seizures (HCC)    History is questionable  . Thyroid disease     Past Surgical History:  Procedure Laterality Date  . ABDOMINAL HYSTERECTOMY  1972   for uterine fibroids/notes 11/10/2010  . ANTERIOR CERVICAL DECOMP/DISCECTOMY FUSION  03/2004   [name]/notes 11/10/2010  . APPENDECTOMY    . BACK SURGERY    . BREAST BIOPSY Right 1996   nodule resected/notes 11/10/2010  . CARDIAC CATHETERIZATION  01/03/2006  . CATARACT EXTRACTION W/ INTRAOCULAR LENS  IMPLANT, BILATERAL Bilateral   . COLONOSCOPY  11/2003   [name]/notes 11/10/2010  . ESOPHAGOGASTRODUODENOSCOPY  09/2006   [name]/notes 11/10/2010  . LUMBAR DISC SURGERY  1990s X 1; 05/2003   "a nerve was pinching"; [name]/notes 11/10/2010  . OVARY SURGERY Left 1999   benign tumor resected /notes 11/10/2010  . TONSILLECTOMY  ~ 1947  reports that she has never smoked. She has never used smokeless tobacco. She reports that she does not drink alcohol or use drugs.  Allergies  Allergen Reactions  . Codeine Nausea Only  . Meperidine Hcl Nausea Only  . Morphine Nausea Only  . Penicillins Nausea Only    Has patient had a PCN reaction causing immediate rash, facial/tongue/throat swelling, SOB or lightheadedness with hypotension: Unk Has patient had a PCN reaction causing severe rash involving mucus membranes or skin necrosis: Unk Has patient had a  PCN reaction that required hospitalization: Unk Has patient had a PCN reaction occurring within the last 10 years: Unk If all of the above answers are "NO", then may proceed with Cephalosporin use.     Family History  Problem Relation Age of Onset  . Heart disease Father   . Heart attack Mother   . Stroke Sister   . Heart attack Sister   . Heart attack Brother        Possible  . Diabetes Sister      Prior to Admission medications   Medication Sig Start Date End Date Taking? Authorizing Provider  acetaminophen (TYLENOL ARTHRITIS PAIN) 650 MG CR tablet Take 650 mg by mouth 2 (two) times daily.     [provider]  amLODipine (NORVASC) 5 MG tablet Take 5 mg by mouth daily.  08/18/17   [provider]  aspirin 81 MG tablet Take 81 mg by mouth daily.      [provider]  baclofen (LIORESAL) 20 MG tablet TAKE 1 AND 1/2 TABLETS BY  MOUTH IN THE MORNING AND IN THE EVENING AND 1 TABLET BY MOUTH AT NOON 11/02/17   Butch Penny, NP  Calcium Carbonate-Vitamin D (CALCIUM + D PO) Take 1 tablet by mouth 2 (two) times daily.     [provider]  clopidogrel (PLAVIX) 75 MG tablet TAKE 1 TABLET BY MOUTH  DAILY 07/22/17   Nahser, Deloris Ping, MD  gabapentin (NEURONTIN) 400 MG capsule Take 1 capsule (400 mg total) by mouth 4 (four) times daily. 07/24/17   York Spaniel, MD  isosorbide mononitrate (IMDUR) 60 MG 24 hr tablet TAKE 1 TABLET BY MOUTH  DAILY 07/14/17   Nahser, Deloris Ping, MD  levothyroxine (SYNTHROID, LEVOTHROID) 75 MCG tablet Take 75 mcg by mouth daily.      [provider]  metoprolol tartrate (LOPRESSOR) 100 MG tablet TAKE 1 TABLET BY MOUTH TWO  TIMES DAILY 07/14/17   Nahser, Deloris Ping, MD  mirabegron ER (MYRBETRIQ) 50 MG TB24 tablet Take 50 mg by mouth 2 (two) times daily.     [provider]  Multiple Vitamin (MULTIVITAMIN) tablet Take 1 tablet by mouth daily.      [provider]  NITROSTAT 0.4 MG SL tablet DISSOLVE ONE TABLET  UNDER THE TONGUE EVERY 5 MINUTES AS NEEDED FOR CHEST PAIN.  DO NOT EXCEED A TOTAL OF 3 DOSES IN 15 MINUTES 10/11/12   Nahser, Deloris Ping, MD  ranitidine (ZANTAC) 150 MG tablet Take 150 mg by mouth 2 (two) times daily.      [provider]  simvastatin (ZOCOR) 20 MG tablet Take 20 mg by mouth every evening.     [provider]  traZODone (DESYREL) 50 MG tablet Take 75 mg by mouth at bedtime.     [provider]  triamterene-hydrochlorothiazide (MAXZIDE-25) 37.5-25 MG tablet Take 0.5-1 tablets by mouth daily.  09/19/17   [provider]  vitamin B-12 (CYANOCOBALAMIN) 1000 MCG tablet  Take 1,000 mcg by mouth daily.    [provider]    Physical Exam: Vitals:   12/12/17 2305 12/12/17 2316 12/12/17 2317 12/12/17 2330  BP: 126/65 (!) 143/56  (!) 128/55  Pulse: 72 66 68 65  Resp: 19  (!) 21 19  Temp:      TempSrc:      SpO2: 92% 92% 92% 93%      Constitutional: NAD, calm  Eyes: PERTLA, lids and conjunctivae normal ENMT: Mucous membranes are dry. Posterior pharynx clear of any exudate or lesions.   Neck: normal, supple, no masses, no thyromegaly Respiratory: clear to auscultation bilaterally, no wheezing, no crackles. Normal respiratory effort.   Cardiovascular: S1 & S2 heard, regular rate and rhythm. No significant JVD. Abdomen: No distension, no tenderness, soft. Bowel sounds normal.  Musculoskeletal: no clubbing / cyanosis. No joint deformity upper and lower extremities.    Skin: no significant rashes, lesions, ulcers. Warm, dry, well-perfused. Neurologic: No facial asymmetry. Sensation to light touch intact, patellar DTR normal. Strength 5/5 in all 4 limbs.  Psychiatric: Alert and oriented to person and place only. Calm, cooperative.     Labs on Admission: I have personally reviewed following labs and imaging studies  CBC: Recent Labs  Lab 12/12/17 2008  WBC 7.7  NEUTROABS 6.1  HGB 13.9  HCT 42.9  MCV 89.2  PLT PLATELET CLUMPS NOTED  ON SMEAR, COUNT APPEARS ADEQUATE   Basic Metabolic Panel: Recent Labs  Lab 12/12/17 2250  NA 144  K 3.2*  CL 102  CO2 32  GLUCOSE 104*  BUN 12  CREATININE 0.77  CALCIUM 10.0   GFR: CrCl cannot be calculated (Unknown ideal weight.). Liver Function Tests: Recent Labs  Lab 12/12/17 2250  AST 28  ALT 19  ALKPHOS 37*  BILITOT 0.9  PROT 6.4*  ALBUMIN 3.8   No results for input(s): LIPASE, AMYLASE in the last 168 hours. No results for input(s): AMMONIA in the last 168 hours. Coagulation Profile: Recent Labs  Lab 12/12/17 2008  INR 1.00   Cardiac Enzymes: No results for input(s): CKTOTAL, CKMB, CKMBINDEX, TROPONINI in the last 168 hours. BNP (last 3 results) No results for input(s): PROBNP in the last 8760 hours. HbA1C: No results for input(s): HGBA1C in the last 72 hours. CBG: No results for input(s): GLUCAP in the last 168 hours. Lipid Profile: No results for input(s): CHOL, HDL, LDLCALC, TRIG, CHOLHDL, LDLDIRECT in the last 72 hours. Thyroid Function Tests: No results for input(s): TSH, T4TOTAL, FREET4, T3FREE, THYROIDAB in the last 72 hours. Anemia Panel: No results for input(s): VITAMINB12, FOLATE, FERRITIN, TIBC, IRON, RETICCTPCT in the last 72 hours. Urine analysis:    Component Value Date/Time   COLORURINE YELLOW 12/12/2017 2000   APPEARANCEUR CLEAR 12/12/2017 2000   LABSPEC 1.010 12/12/2017 2000   PHURINE 7.0 12/12/2017 2000   GLUCOSEU NEGATIVE 12/12/2017 2000   HGBUR NEGATIVE 12/12/2017 2000   BILIRUBINUR NEGATIVE 12/12/2017 2000   KETONESUR NEGATIVE 12/12/2017 2000   PROTEINUR NEGATIVE 12/12/2017 2000   NITRITE NEGATIVE 12/12/2017 2000   LEUKOCYTESUR NEGATIVE 12/12/2017 2000   Sepsis Labs: @LABRCNTIP (procalcitonin:4,lacticidven:4) )No results found for this or any previous visit (from the past 240 hour(s)).   Radiological Exams on Admission: Ct Head Wo Contrast  Result Date: 12/12/2017 CLINICAL DATA:  84 year old female with unwitnessed fall.  Laceration posterior head. Initial encounter. EXAM: CT HEAD WITHOUT CONTRAST CT CERVICAL SPINE WITHOUT CONTRAST TECHNIQUE: Multidetector CT imaging of the head and cervical spine was performed following the standard  protocol without intravenous contrast. Multiplanar CT image reconstructions of the cervical spine were also generated. COMPARISON:  05/16/2017 CT head and cervical spine. FINDINGS: CT HEAD FINDINGS Brain: No intracranial hemorrhage or CT evidence of large acute infarct. Chronic microvascular changes. Mild global atrophy. No intracranial mass lesion noted on this unenhanced exam. Vascular: Vascular calcifications. Skull: No skull fracture Sinuses/Orbits: No acute orbital abnormality. Mild exophthalmos. Minimal mucosal thickening ethmoid sinus air cells. Polypoid opacification right maxillary sinus. Mastoid air cells and middle ear cavities are clear. Other: Right occipital small scalp laceration. Degenerative changes temporomandibular joint. CT CERVICAL SPINE FINDINGS Alignment: Mild rotation of the head and C1 ring upon C2. Skull base and vertebrae: No cervical spine fracture. Prior fusion C5-C7. Soft tissues and spinal canal: No abnormal prevertebral soft tissue swelling. Transverse ligament hypertrophy calcification. Disc levels: Cervical spondylotic changes with spinal stenosis and cord flattening C3-4 through C6-7. Upper chest: No worrisome lung apical lesion. Other: Carotid bifurcation calcifications. IMPRESSION: No skull fracture or intracranial hemorrhage. Laceration right occipital region. Chronic microvascular changes. Atrophy. Rotation of the head and C1 ring upon C2 may be related to head positioning. No cervical spine fracture. Prior fusion C5-C7. Cervical spondylotic changes with spinal stenosis and cord flattening C3-4 through C6-7. Electronically Signed   By: Lacy Duverney M.D.   On: 12/12/2017 19:44   Ct Cervical Spine Wo Contrast  Result Date: 12/12/2017 CLINICAL DATA:  84 year old  female with unwitnessed fall. Laceration posterior head. Initial encounter. EXAM: CT HEAD WITHOUT CONTRAST CT CERVICAL SPINE WITHOUT CONTRAST TECHNIQUE: Multidetector CT imaging of the head and cervical spine was performed following the standard protocol without intravenous contrast. Multiplanar CT image reconstructions of the cervical spine were also generated. COMPARISON:  05/16/2017 CT head and cervical spine. FINDINGS: CT HEAD FINDINGS Brain: No intracranial hemorrhage or CT evidence of large acute infarct. Chronic microvascular changes. Mild global atrophy. No intracranial mass lesion noted on this unenhanced exam. Vascular: Vascular calcifications. Skull: No skull fracture Sinuses/Orbits: No acute orbital abnormality. Mild exophthalmos. Minimal mucosal thickening ethmoid sinus air cells. Polypoid opacification right maxillary sinus. Mastoid air cells and middle ear cavities are clear. Other: Right occipital small scalp laceration. Degenerative changes temporomandibular joint. CT CERVICAL SPINE FINDINGS Alignment: Mild rotation of the head and C1 ring upon C2. Skull base and vertebrae: No cervical spine fracture. Prior fusion C5-C7. Soft tissues and spinal canal: No abnormal prevertebral soft tissue swelling. Transverse ligament hypertrophy calcification. Disc levels: Cervical spondylotic changes with spinal stenosis and cord flattening C3-4 through C6-7. Upper chest: No worrisome lung apical lesion. Other: Carotid bifurcation calcifications. IMPRESSION: No skull fracture or intracranial hemorrhage. Laceration right occipital region. Chronic microvascular changes. Atrophy. Rotation of the head and C1 ring upon C2 may be related to head positioning. No cervical spine fracture. Prior fusion C5-C7. Cervical spondylotic changes with spinal stenosis and cord flattening C3-4 through C6-7. Electronically Signed   By: Lacy Duverney M.D.   On: 12/12/2017 19:44    EKG: Independently reviewed. Sinus rhythm, 1st degree  AVB, LAD.   Assessment/Plan  1. Acute encephalopathy  - Presents following an unwitnessed fall, found to be confused  - Family reports that she is not usually confused  - Head CT is negative for acute intracranial abnormality and no focal neuro deficits identified  - UA not suggestive of infection  - Possibly a mild TBI; unclear if AMS began before or after the fall  -Possibly medication-related, hold baclofen and Neurontin initially - Check TSH, ammonia, RPR, B12, folate levels  -  Continue supportive care, PT eval and tx    2. Fall; scalp laceration  - Presents following an unwitnessed fall  - She is found to have right occipital scalp laceration, no focal deficit on exam and no acute intracranial findings on CT - Continue supportive care, consult with PT for eval and tx   3. Hypokalemia  - Serum potassium is 3.2  - GIven 20 mEq oral potassium and KCl added to IVF  - Hold triamterene-HCTZ, repeat chem panel in am    4. Hypothyroidism  - Check TSH given AMS, continue Synthroid    5. Hx of TIA  - No focal deficits on admission  - Head CT negative for acute intracranial abnormality  - Continue ASA, Plavix, and statin    6. CAD - No anginal complaints  - Continue ASA, Plavix, Imdur, statin, beta-blocker    DVT prophylaxis: SCD's  Code Status: Full  Family Communication: Son updated by phone  Consults called: None Admission status: Observation     Briscoe Deutscher, MD [facility] Pager [phone]  If 7PM-7AM, please contact night-coverage [email or web address] Password [facility]  12/13/2017, 12:28 AM

## 2017-12-13 NOTE — ED Notes (Signed)
Attempted report x 2; name and call back number provided 

## 2017-12-13 NOTE — Progress Notes (Addendum)
I have seen and examined patient and I agree with Dr. Francesco Runnerpyd's assessment and plan.  Patient is a pleasant 84 year old female history of hypertension, hypothyroidism, coronary artery disease, history of TIA presented to the ED with confusion and unwitnessed fall.  Patient resides at the assisted living facility typically oriented per report of family.  Patient had presented with acute encephalopathy after unwitnessed fall and found to be confused.  Patient improving.  TSH which was done was within normal limits.  Ammonia level was 10.  Vitamin B12 level was elevated at 1900.  RPR and HIV were nonreactive.  Patient with no signs or symptoms of infection.  Patient noted to be hypokalemic and hypomagnesemic.  Replete electrolytes.  Monitor on telemetry.  Complains of dizziness today and as such we will check orthostatics.  PT/OT.  Follow.  No charge.

## 2017-12-13 NOTE — Progress Notes (Signed)
Arrived from Ed. Alert and oriented x3 not time. C/o of back pain. Call light within reach.

## 2017-12-13 NOTE — ED Notes (Signed)
Attempted report x 1; name and call back number provided 

## 2017-12-14 DIAGNOSIS — I2510 Atherosclerotic heart disease of native coronary artery without angina pectoris: Secondary | ICD-10-CM

## 2017-12-14 DIAGNOSIS — I10 Essential (primary) hypertension: Secondary | ICD-10-CM | POA: Diagnosis not present

## 2017-12-14 DIAGNOSIS — G9340 Encephalopathy, unspecified: Secondary | ICD-10-CM | POA: Diagnosis not present

## 2017-12-14 DIAGNOSIS — W19XXXA Unspecified fall, initial encounter: Secondary | ICD-10-CM

## 2017-12-14 LAB — BASIC METABOLIC PANEL
Anion gap: 6 (ref 5–15)
BUN: 9 mg/dL (ref 6–20)
CHLORIDE: 106 mmol/L (ref 101–111)
CO2: 29 mmol/L (ref 22–32)
CREATININE: 0.63 mg/dL (ref 0.44–1.00)
Calcium: 8.9 mg/dL (ref 8.9–10.3)
GFR calc Af Amer: >60 mL/min (ref >60)
Glucose, Bld: 122 mg/dL — ABNORMAL HIGH (ref 65–99)
POTASSIUM: 3.7 mmol/L (ref 3.5–5.1)
SODIUM: 141 mmol/L (ref 135–145)

## 2017-12-14 LAB — CBC
HCT: 44.4 % (ref 36.0–46.0)
HEMOGLOBIN: 14.7 g/dL (ref 12.0–15.0)
MCH: 28.9 pg (ref 26.0–34.0)
MCHC: 33.1 g/dL (ref 30.0–36.0)
MCV: 87.2 fL (ref 78.0–100.0)
PLATELETS: 149 10*3/uL — AB (ref 150–400)
RBC: 5.09 MIL/uL (ref 3.87–5.11)
RDW: 13.7 % (ref 11.5–15.5)
WBC: 8.2 10*3/uL (ref 4.0–10.5)

## 2017-12-14 LAB — GLUCOSE, CAPILLARY: Glucose-Capillary: 126 mg/dL — ABNORMAL HIGH (ref 65–99)

## 2017-12-14 LAB — MAGNESIUM: Magnesium: 1.9 mg/dL (ref 1.7–2.4)

## 2017-12-14 LAB — FOLATE RBC
Folate, Hemolysate: >620 ng/mL
HEMATOCRIT: 44.6 % (ref 34.0–46.6)

## 2017-12-14 MED ORDER — POTASSIUM CHLORIDE 10 MEQ/100ML IV SOLN
10.0000 meq | INTRAVENOUS | Status: DC
Start: 2017-12-14 — End: 2017-12-14
  Administered 2017-12-14: 10 meq via INTRAVENOUS
  Filled 2017-12-14 (×5): qty 100

## 2017-12-14 NOTE — NC FL2 (Signed)
[place] MEDICAID FL2 LEVEL OF CARE SCREENING TOOL     IDENTIFICATION  Patient Name: Kimberly Bass Birthdate: 03/03/1926 Sex: female Admission Date (Current Location): 12/12/2017  [facility] [place]   Facility and Address:  [facility], [address], [place], [place] [address]      Provider Number: [id number]  Attending Physician Name and Address:  Maretta BeesGhimire, Shanker M, MD  Relative Name and Phone Number:       Current Level of Care: Hospital Recommended Level of Care: Skilled Nursing Facility Prior Approval Number:    Date Approved/Denied:   PASRR Number: Pending; [place] Must issues  Discharge Plan: SNF    Current Diagnoses: Patient Active Problem List   Diagnosis Date Noted  . Hypothyroidism 12/12/2017  . Acute encephalopathy 12/12/2017  . Unwitnessed fall 12/12/2017  . Essential hypertension 12/12/2017  . Hypokalemia 12/12/2017  . Hyperlipidemia 05/05/2017  . Tremor, essential 11/03/2015  . 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 Abnormality of gait 08/15/2012  . Essential and other specified forms of tremor 08/15/2012  . Degeneration of lumbar or lumbosacral intervertebral disc 08/15/2012  . Hx of low back pain 08/15/2012  . CAD (coronary artery disease) 12/23/2010  . Hx of transient ischemic attack (TIA)   . CONSTIPATION 02/27/2009  . PERSONAL HX COLONIC POLYPS 02/27/2009    Orientation RESPIRATION BLADDER Height & Weight     Self, Time, Situation, Place  Normal Continent Weight:   Height:     BEHAVIORAL SYMPTOMS/MOOD NEUROLOGICAL BOWEL NUTRITION STATUS      Continent Diet(heart healthy)  AMBULATORY STATUS COMMUNICATION OF NEEDS Skin   Limited Assist Verbally Normal                       Personal Care Assistance Level of Assistance  Bathing, Feeding, Dressing Bathing Assistance: Limited assistance Feeding assistance: Independent Dressing Assistance: Limited assistance     Functional Limitations Info  Sight,  Hearing, Speech Sight Info: Adequate Hearing Info: Adequate Speech Info: Adequate    SPECIAL CARE FACTORS FREQUENCY  PT (By licensed PT), OT (By licensed OT)     PT Frequency: 5x/wk OT Frequency: 5x/wk            Contractures Contractures Info: Not present    Additional Factors Info  Code Status, Allergies Code Status Info: Full Allergies Info: Codeine, Meperidine Hcl, Morphine, Penicillins           Current Medications (12/14/2017):  This is the current hospital active medication list Current Facility-Administered Medications  Medication Dose Route Frequency Provider Last Rate Last Dose  . acetaminophen (TYLENOL) tablet 650 mg  650 mg Oral Q6H PRN Opyd, Lavone Neriimothy S, MD       Or  . acetaminophen (TYLENOL) suppository 650 mg  650 mg Rectal Q6H PRN Opyd, Lavone Neriimothy S, MD      . amLODipine (NORVASC) tablet 5 mg  5 mg Oral Daily Opyd, Lavone Neriimothy S, MD   5 mg at 12/14/17 0910  . aspirin chewable tablet 81 mg  81 mg Oral Daily Opyd, Lavone Neriimothy S, MD   81 mg at 12/14/17 0911  . bisacodyl (DULCOLAX) EC tablet 5 mg  5 mg Oral Daily PRN Opyd, Lavone Neriimothy S, MD      . clopidogrel (PLAVIX) tablet 75 mg  75 mg Oral Daily Opyd, Lavone Neriimothy S, MD   75 mg at 12/14/17 0911  . famotidine (PEPCID) tablet 20 mg  20 mg Oral BID Opyd, Lavone Neriimothy S, MD  20 mg at 12/14/17 0912  . isosorbide mononitrate (IMDUR) 24 hr tablet 60 mg  60 mg Oral Daily Opyd, Lavone Neri, MD   60 mg at 12/14/17 0912  . levothyroxine (SYNTHROID, LEVOTHROID) tablet 75 mcg  75 mcg Oral QAC breakfast Opyd, Lavone Neri, MD   75 mcg at 12/14/17 0553  . metoprolol tartrate (LOPRESSOR) tablet 100 mg  100 mg Oral BID Opyd, Lavone Neri, MD   100 mg at 12/14/17 0912  . mirabegron ER (MYRBETRIQ) tablet 50 mg  50 mg Oral BID Opyd, Lavone Neri, MD   50 mg at 12/14/17 0913  . multivitamin with minerals tablet 1 tablet  1 tablet Oral Daily Opyd, Lavone Neri, MD   1 tablet at 12/14/17 0913  . ondansetron (ZOFRAN) tablet 4 mg  4 mg Oral Q6H PRN Opyd, Lavone Neri, MD        Or  . ondansetron (ZOFRAN) injection 4 mg  4 mg Intravenous Q6H PRN Opyd, Lavone Neri, MD      . senna-docusate (Senokot-S) tablet 1 tablet  1 tablet Oral QHS PRN Opyd, Lavone Neri, MD      . simvastatin (ZOCOR) tablet 20 mg  20 mg Oral QPM Opyd, Lavone Neri, MD   20 mg at 12/13/17 1819  . sodium chloride flush (NS) 0.9 % injection 3 mL  3 mL Intravenous Q12H Opyd, Lavone Neri, MD   10 mL at 12/14/17 0915  . traMADol (ULTRAM) tablet 50-100 mg  50-100 mg Oral Q6H PRN Opyd, Lavone Neri, MD      . traZODone (DESYREL) tablet 75 mg  75 mg Oral QHS Briscoe Deutscher, MD   75 mg at 12/13/17 2112  . vitamin B-12 (CYANOCOBALAMIN) tablet 1,000 mcg  1,000 mcg Oral Daily Opyd, Lavone Neri, MD   1,000 mcg at 12/14/17 4098     Discharge Medications: Please see discharge summary for a list of discharge medications.  Relevant Imaging Results:  Relevant Lab Results:   Additional Information SS#: [id number]  Baldemar Lenis, LCSW

## 2017-12-14 NOTE — Clinical Social Work Note (Signed)
Clinical Social Work Assessment  Patient Details  Name: Kimberly Bass MRN: [id number] Date of Birth: Apr 08, 1926  Date of referral:  12/13/17               Reason for consult:  Facility Placement                Permission sought to share information with:  Facility Sport and exercise psychologist, Family Supports Permission granted to share information::  Yes, Verbal Permission Granted  Name::     Darla Lesches::  SNF  Relationship::  Son  Sport and exercise psychologist Information:     Housing/Transportation Living arrangements for the past 2 months:  Charity fundraiser of Information:  Patient, Adult Children Patient Interpreter Needed:  None Criminal Activity/Legal Involvement Pertinent to Current Situation/Hospitalization:  No - Comment as needed Significant Relationships:  Adult Children, Other Family Members Lives with:  Self Do you feel safe going back to the place where you live?  Yes Need for family participation in patient care:  No (Coment)  Care giving concerns:  Patient from [place] but will need short term rehab at discharge to be able to return home alone.   Social Worker assessment / plan:  CSW met with patient and then contacted son via phone. CSW discussed recommendation for SNF, and discussed reasons why recommendation was made. CSW provided phone to patient for son to communicate with her. CSW emailed patient's brother a list of SNF options to research.  Employment status:  Retired Nurse, adult PT Recommendations:  [place] / Referral to community resources:  [place]  Patient/Family's Response to care:  Patient and family are hopeful that patient can return to her independent living apartment, but are willing to think about SNF and talk again tomorrow.  Patient/Family's Understanding of and Emotional Response to Diagnosis, Current Treatment, and Prognosis:  Patient and family  discussed how patient had been independent prior to admission and that they think she'll be fine now that her confusion has cleared. Patient and family are agreeable to looking at how she does mobilizing tomorrow and making the decision then.  Emotional Assessment Appearance:  Appears stated age Attitude/Demeanor/Rapport:  Engaged Affect (typically observed):  Pleasant Orientation:  Oriented to Self, Oriented to Place, Oriented to  Time, Oriented to Situation Alcohol / Substance use:  Not Applicable Psych involvement (Current and /or in the community):  No (Comment)  Discharge Needs  Concerns to be addressed:  Care Coordination Readmission within the last 30 days:  No Current discharge risk:  Lives alone, Dependent with Mobility Barriers to Discharge:  Continued Medical Work up, [place], [place] 12/14/2017, 9:28 AM

## 2017-12-14 NOTE — Discharge Summary (Signed)
PATIENT DETAILS Name: Kimberly Bass Age: 84 y.o. Sex: female Date of Birth: [birth year] MRN: [id number]. Admitting Physician: Briscoe Deutscher, MD [id number], Zollie Beckers, MD  Admit Date: 12/12/2017 Discharge date: 12/14/2017  Recommendations for Outpatient Follow-up:  1. Follow up with PCP in 1-2 weeks 2. Please obtain BMP/CBC in one week  Admitted From:  ILF  Disposition: SNF vs back to ILF with PT   Equipment/Devices: None  Discharge Condition: Stable  CODE STATUS: FULL CODE  Diet recommendation:  Heart Healthy  Brief Summary: See H&P, Labs, Consult and Test reports for all details in brief, patient is a 84 year old female with prior history of hypertension, hypothyroidism, CAD, TIA presenting with transient confusion after a mechanical fall.  She was provided supportive care-her mental status rapidly improved and by the day of discharge she was back to usual baseline.  See below for further details  Brief Hospital Course: Acute encephalopathy: Etiology uncertain-this occurred after a mechanical fall.  CT head was negative for acute abnormalities.  UA was not suggestive of any infection.  She had no other clinical signs of infection as well.  Could be possibly related to baclofen use.  Baclofen was held-she was provided with supportive care, she rapidly improved-and is completely awake and alert by the day of discharge.   Fall: Patient also had a unwitnessed fall-claims that she lost balance.  She denies any syncopal episode.  CT head and C-spine were negative.  Hypokalemia: This was secondary to diuretic use-this was repleted.  Electro lites are stable at the time of discharge.  Hypothyroidism: Continue Synthroid.  TSH was within normal limits  Hypertension: Blood pressure stable with amlodipine, indoor, metoprolol.  Diuretics on hold  History of CAD/TIA: No anginal symptoms-no symptoms suggestive of recurrent TIA/CVA.  Continue antiplatelet  agents.  Procedures/Studies: None  Discharge Diagnoses:  Principal Problem:   Acute encephalopathy Active Problems:   Hx of transient ischemic attack (TIA)   CAD (coronary artery disease)   Hypothyroidism   Unwitnessed fall   Essential hypertension   Hypokalemia   Discharge Instructions:  Activity:  As tolerated with Full fall precautions use walker/cane & assistance as needed   Discharge Instructions    Diet - low sodium heart healthy   Complete by:  As directed    Increase activity slowly   Complete by:  As directed      Allergies as of 12/14/2017      Reactions   Codeine Nausea Only   Meperidine Hcl Nausea Only   Morphine Nausea Only   Penicillins Nausea Only   Has patient had a PCN reaction causing immediate rash, facial/tongue/throat swelling, SOB or lightheadedness with hypotension: Unk Has patient had a PCN reaction causing severe rash involving mucus membranes or skin necrosis: Unk Has patient had a PCN reaction that required hospitalization: Unk Has patient had a PCN reaction occurring within the last 10 years: Unk If all of the above answers are "NO", then Bass proceed with Cephalosporin use.      Medication List    STOP taking these medications   baclofen 20 MG tablet Commonly known as:  LIORESAL   triamterene-hydrochlorothiazide 37.5-25 MG tablet Commonly known as:  MAXZIDE-25     TAKE these medications   amLODipine 5 MG tablet Commonly known as:  NORVASC Take 5 mg by mouth daily.   aspirin 81 MG tablet Take 81 mg by mouth daily.   CALCIUM + D PO Take 1 tablet by mouth 2 (two) times daily.  clopidogrel 75 MG tablet Commonly known as:  PLAVIX TAKE 1 TABLET BY MOUTH  DAILY   gabapentin 400 MG capsule Commonly known as:  NEURONTIN Take 1 capsule (400 mg total) by mouth 4 (four) times daily.   isosorbide mononitrate 60 MG 24 hr tablet Commonly known as:  IMDUR TAKE 1 TABLET BY MOUTH  DAILY   levothyroxine 75 MCG tablet Commonly known  as:  SYNTHROID, LEVOTHROID Take 75 mcg by mouth daily.   metoprolol tartrate 100 MG tablet Commonly known as:  LOPRESSOR TAKE 1 TABLET BY MOUTH TWO  TIMES DAILY   mirabegron ER 50 MG Tb24 tablet Commonly known as:  MYRBETRIQ Take 50 mg by mouth 2 (two) times daily.   multivitamin tablet Take 1 tablet by mouth daily.   NITROSTAT 0.4 MG SL tablet Generic drug:  nitroGLYCERIN DISSOLVE ONE TABLET UNDER THE TONGUE EVERY 5 MINUTES AS NEEDED FOR CHEST PAIN.  DO NOT EXCEED A TOTAL OF 3 DOSES IN 15 MINUTES   ranitidine 150 MG tablet Commonly known as:  ZANTAC Take 150 mg by mouth 2 (two) times daily.   simvastatin 20 MG tablet Commonly known as:  ZOCOR Take 20 mg by mouth every evening.   traZODone 50 MG tablet Commonly known as:  DESYREL Take 75 mg by mouth at bedtime.   TYLENOL ARTHRITIS PAIN 650 MG CR tablet Generic drug:  acetaminophen Take 650 mg by mouth 2 (two) times daily.   vitamin B-12 1000 MCG tablet Commonly known as:  CYANOCOBALAMIN Take 1,000 mcg by mouth daily.      Follow-up Information    Merri BrunettePharr, Walter, MD. Schedule an appointment as soon as possible for a visit in 1 week(s).   Specialty:  Internal Medicine Contact information: [address] [place] [id number] [phone]          Allergies  Allergen Reactions  . Codeine Nausea Only  . Meperidine Hcl Nausea Only  . Morphine Nausea Only  . Penicillins Nausea Only    Has patient had a PCN reaction causing immediate rash, facial/tongue/throat swelling, SOB or lightheadedness with hypotension: Unk Has patient had a PCN reaction causing severe rash involving mucus membranes or skin necrosis: Unk Has patient had a PCN reaction that required hospitalization: Unk Has patient had a PCN reaction occurring within the last 10 years: Unk If all of the above answers are "NO", then Bass proceed with Cephalosporin use.     Consultations:   None Other Procedures/Studies: Ct Head Wo  Contrast  Result Date: 12/12/2017 CLINICAL DATA:  84 year old female with unwitnessed fall. Laceration posterior head. Initial encounter. EXAM: CT HEAD WITHOUT CONTRAST CT CERVICAL SPINE WITHOUT CONTRAST TECHNIQUE: Multidetector CT imaging of the head and cervical spine was performed following the standard protocol without intravenous contrast. Multiplanar CT image reconstructions of the cervical spine were also generated. COMPARISON:  05/16/2017 CT head and cervical spine. FINDINGS: CT HEAD FINDINGS Brain: No intracranial hemorrhage or CT evidence of large acute infarct. Chronic microvascular changes. Mild global atrophy. No intracranial mass lesion noted on this unenhanced exam. Vascular: Vascular calcifications. Skull: No skull fracture Sinuses/Orbits: No acute orbital abnormality. Mild exophthalmos. Minimal mucosal thickening ethmoid sinus air cells. Polypoid opacification right maxillary sinus. Mastoid air cells and middle ear cavities are clear. Other: Right occipital small scalp laceration. Degenerative changes temporomandibular joint. CT CERVICAL SPINE FINDINGS Alignment: Mild rotation of the head and C1 ring upon C2. Skull base and vertebrae: No cervical spine fracture. Prior fusion C5-C7. Soft tissues and spinal  canal: No abnormal prevertebral soft tissue swelling. Transverse ligament hypertrophy calcification. Disc levels: Cervical spondylotic changes with spinal stenosis and cord flattening C3-4 through C6-7. Upper chest: No worrisome lung apical lesion. Other: Carotid bifurcation calcifications. IMPRESSION: No skull fracture or intracranial hemorrhage. Laceration right occipital region. Chronic microvascular changes. Atrophy. Rotation of the head and C1 ring upon C2 Bass be related to head positioning. No cervical spine fracture. Prior fusion C5-C7. Cervical spondylotic changes with spinal stenosis and cord flattening C3-4 through C6-7. Electronically Signed   By: Lacy Duverney M.D.   On: 12/12/2017  19:44   Ct Cervical Spine Wo Contrast  Result Date: 12/12/2017 CLINICAL DATA:  84 year old female with unwitnessed fall. Laceration posterior head. Initial encounter. EXAM: CT HEAD WITHOUT CONTRAST CT CERVICAL SPINE WITHOUT CONTRAST TECHNIQUE: Multidetector CT imaging of the head and cervical spine was performed following the standard protocol without intravenous contrast. Multiplanar CT image reconstructions of the cervical spine were also generated. COMPARISON:  05/16/2017 CT head and cervical spine. FINDINGS: CT HEAD FINDINGS Brain: No intracranial hemorrhage or CT evidence of large acute infarct. Chronic microvascular changes. Mild global atrophy. No intracranial mass lesion noted on this unenhanced exam. Vascular: Vascular calcifications. Skull: No skull fracture Sinuses/Orbits: No acute orbital abnormality. Mild exophthalmos. Minimal mucosal thickening ethmoid sinus air cells. Polypoid opacification right maxillary sinus. Mastoid air cells and middle ear cavities are clear. Other: Right occipital small scalp laceration. Degenerative changes temporomandibular joint. CT CERVICAL SPINE FINDINGS Alignment: Mild rotation of the head and C1 ring upon C2. Skull base and vertebrae: No cervical spine fracture. Prior fusion C5-C7. Soft tissues and spinal canal: No abnormal prevertebral soft tissue swelling. Transverse ligament hypertrophy calcification. Disc levels: Cervical spondylotic changes with spinal stenosis and cord flattening C3-4 through C6-7. Upper chest: No worrisome lung apical lesion. Other: Carotid bifurcation calcifications. IMPRESSION: No skull fracture or intracranial hemorrhage. Laceration right occipital region. Chronic microvascular changes. Atrophy. Rotation of the head and C1 ring upon C2 Bass be related to head positioning. No cervical spine fracture. Prior fusion C5-C7. Cervical spondylotic changes with spinal stenosis and cord flattening C3-4 through C6-7. Electronically Signed   By: Lacy Duverney M.D.   On: 12/12/2017 19:44      TODAY-DAY OF DISCHARGE:  Subjective:   Kimberly Bass today has no headache,no chest abdominal pain,no new weakness tingling or numbness, feels much better wants to go home today.   Objective:   Blood pressure 140/72, pulse 91, temperature 97.6 F (36.4 C), temperature source Oral, resp. rate 18, SpO2 98 %.  Intake/Output Summary (Last 24 hours) at 12/14/2017 1110 Last data filed at 12/14/2017 0852 Gross per 24 hour  Intake 1282.5 ml  Output 1001 ml  Net 281.5 ml   There were no vitals filed for this visit.  Exam: Awake Alert, Oriented *3, No new F.N deficits, Normal affect [place].AT,PERRAL Supple Neck,No JVD, No cervical lymphadenopathy appriciated.  Symmetrical Chest wall movement, Good air movement bilaterally, CTAB RRR,No Gallops,Rubs or new Murmurs, No Parasternal Heave +ve B.Sounds, Abd Soft, Non tender, No organomegaly appriciated, No rebound -guarding or rigidity. No Cyanosis, Clubbing or edema, No new Rash or bruise   PERTINENT RADIOLOGIC STUDIES: Ct Head Wo Contrast  Result Date: 12/12/2017 CLINICAL DATA:  84 year old female with unwitnessed fall. Laceration posterior head. Initial encounter. EXAM: CT HEAD WITHOUT CONTRAST CT CERVICAL SPINE WITHOUT CONTRAST TECHNIQUE: Multidetector CT imaging of the head and cervical spine was performed following the standard protocol without intravenous contrast. Multiplanar CT image reconstructions of the cervical  spine were also generated. COMPARISON:  05/16/2017 CT head and cervical spine. FINDINGS: CT HEAD FINDINGS Brain: No intracranial hemorrhage or CT evidence of large acute infarct. Chronic microvascular changes. Mild global atrophy. No intracranial mass lesion noted on this unenhanced exam. Vascular: Vascular calcifications. Skull: No skull fracture Sinuses/Orbits: No acute orbital abnormality. Mild exophthalmos. Minimal mucosal thickening ethmoid sinus air cells. Polypoid opacification  right maxillary sinus. Mastoid air cells and middle ear cavities are clear. Other: Right occipital small scalp laceration. Degenerative changes temporomandibular joint. CT CERVICAL SPINE FINDINGS Alignment: Mild rotation of the head and C1 ring upon C2. Skull base and vertebrae: No cervical spine fracture. Prior fusion C5-C7. Soft tissues and spinal canal: No abnormal prevertebral soft tissue swelling. Transverse ligament hypertrophy calcification. Disc levels: Cervical spondylotic changes with spinal stenosis and cord flattening C3-4 through C6-7. Upper chest: No worrisome lung apical lesion. Other: Carotid bifurcation calcifications. IMPRESSION: No skull fracture or intracranial hemorrhage. Laceration right occipital region. Chronic microvascular changes. Atrophy. Rotation of the head and C1 ring upon C2 Bass be related to head positioning. No cervical spine fracture. Prior fusion C5-C7. Cervical spondylotic changes with spinal stenosis and cord flattening C3-4 through C6-7. Electronically Signed   By: Lacy Duverney M.D.   On: 12/12/2017 19:44   Ct Cervical Spine Wo Contrast  Result Date: 12/12/2017 CLINICAL DATA:  84 year old female with unwitnessed fall. Laceration posterior head. Initial encounter. EXAM: CT HEAD WITHOUT CONTRAST CT CERVICAL SPINE WITHOUT CONTRAST TECHNIQUE: Multidetector CT imaging of the head and cervical spine was performed following the standard protocol without intravenous contrast. Multiplanar CT image reconstructions of the cervical spine were also generated. COMPARISON:  05/16/2017 CT head and cervical spine. FINDINGS: CT HEAD FINDINGS Brain: No intracranial hemorrhage or CT evidence of large acute infarct. Chronic microvascular changes. Mild global atrophy. No intracranial mass lesion noted on this unenhanced exam. Vascular: Vascular calcifications. Skull: No skull fracture Sinuses/Orbits: No acute orbital abnormality. Mild exophthalmos. Minimal mucosal thickening ethmoid sinus air  cells. Polypoid opacification right maxillary sinus. Mastoid air cells and middle ear cavities are clear. Other: Right occipital small scalp laceration. Degenerative changes temporomandibular joint. CT CERVICAL SPINE FINDINGS Alignment: Mild rotation of the head and C1 ring upon C2. Skull base and vertebrae: No cervical spine fracture. Prior fusion C5-C7. Soft tissues and spinal canal: No abnormal prevertebral soft tissue swelling. Transverse ligament hypertrophy calcification. Disc levels: Cervical spondylotic changes with spinal stenosis and cord flattening C3-4 through C6-7. Upper chest: No worrisome lung apical lesion. Other: Carotid bifurcation calcifications. IMPRESSION: No skull fracture or intracranial hemorrhage. Laceration right occipital region. Chronic microvascular changes. Atrophy. Rotation of the head and C1 ring upon C2 Bass be related to head positioning. No cervical spine fracture. Prior fusion C5-C7. Cervical spondylotic changes with spinal stenosis and cord flattening C3-4 through C6-7. Electronically Signed   By: Lacy Duverney M.D.   On: 12/12/2017 19:44     PERTINENT LAB RESULTS: CBC: Recent Labs    12/13/17 0217 12/14/17 0615  WBC 8.4 8.2  HGB 15.3* 14.7  HCT 47.0* 44.4  PLT 160 149*   CMET CMP     Component Value Date/Time   NA 141 12/14/2017 0615   K 3.7 12/14/2017 0615   CL 106 12/14/2017 0615   CO2 29 12/14/2017 0615   GLUCOSE 122 (H) 12/14/2017 0615   BUN 9 12/14/2017 0615   CREATININE 0.63 12/14/2017 0615   CALCIUM 8.9 12/14/2017 0615   PROT 6.4 (L) 12/12/2017 2250   ALBUMIN 3.8 12/12/2017 2250  AST 28 12/12/2017 2250   ALT 19 12/12/2017 2250   ALKPHOS 37 (L) 12/12/2017 2250   BILITOT 0.9 12/12/2017 2250   GFRNONAA >60 12/14/2017 0615   GFRAA >60 12/14/2017 0615    GFR CrCl cannot be calculated (Unknown ideal weight.). No results for input(s): LIPASE, AMYLASE in the last 72 hours. No results for input(s): CKTOTAL, CKMB, CKMBINDEX, TROPONINI in the  last 72 hours. Invalid input(s): POCBNP No results for input(s): DDIMER in the last 72 hours. No results for input(s): HGBA1C in the last 72 hours. No results for input(s): CHOL, HDL, LDLCALC, TRIG, CHOLHDL, LDLDIRECT in the last 72 hours. Recent Labs    12/13/17 0028  TSH 2.114   Recent Labs    12/13/17 0028  VITAMINB12 1,900*   Coags: Recent Labs    12/12/17 2008  INR 1.00   Microbiology: No results found for this or any previous visit (from the past 240 hour(s)).  FURTHER DISCHARGE INSTRUCTIONS:  Get Medicines reviewed and adjusted: Please take all your medications with you for your next visit with your Primary MD  Laboratory/radiological data: Please request your Primary MD to go over all hospital tests and procedure/radiological results at the follow up, please ask your Primary MD to get all Hospital records sent to his/her office.  In some cases, they will be blood work, cultures and biopsy results pending at the time of your discharge. Please request that your primary care M.D. goes through all the records of your hospital data and follows up on these results.  Also Note the following: If you experience worsening of your admission symptoms, develop shortness of breath, life threatening emergency, suicidal or homicidal thoughts you must seek medical attention immediately by calling 911 or calling your MD immediately  if symptoms less severe.  You must read complete instructions/literature along with all the possible adverse reactions/side effects for all the Medicines you take and that have been prescribed to you. Take any new Medicines after you have completely understood and accpet all the possible adverse reactions/side effects.   Do not drive when taking Pain medications or sleeping medications (Benzodaizepines)  Do not take more than prescribed Pain, Sleep and Anxiety Medications. It is not advisable to combine anxiety,sleep and pain medications without talking  with your primary care practitioner  Special Instructions: If you have smoked or chewed Tobacco  in the last 2 yrs please stop smoking, stop any regular Alcohol  and or any Recreational drug use.  Wear Seat belts while driving.  Please note: You were cared for by a hospitalist during your hospital stay. Once you are discharged, your primary care physician will handle any further medical issues. Please note that NO REFILLS for any discharge medications will be authorized once you are discharged, as it is imperative that you return to your primary care physician (or establish a relationship with a primary care physician if you do not have one) for your post hospital discharge needs so that they can reassess your need for medications and monitor your lab values.  Total Time spent coordinating discharge including counseling, education and face to face time equals  45 minutes.  Signed: Shanker Ghimire 12/14/2017 11:10 AM

## 2017-12-15 ENCOUNTER — Inpatient Hospital Stay
Admission: RE | Admit: 2017-12-15 | Discharge: 2017-12-25 | Disposition: A | Discharge status: Home / Self Care | Payer: Medicare Other | Source: Ambulatory Visit | Attending: Internal Medicine | Admitting: Internal Medicine

## 2017-12-15 DIAGNOSIS — I10 Essential (primary) hypertension: Secondary | ICD-10-CM | POA: Diagnosis not present

## 2017-12-15 DIAGNOSIS — E782 Mixed hyperlipidemia: Secondary | ICD-10-CM | POA: Diagnosis not present

## 2017-12-15 DIAGNOSIS — Z9181 History of falling: Secondary | ICD-10-CM | POA: Diagnosis not present

## 2017-12-15 DIAGNOSIS — G253 Myoclonus: Secondary | ICD-10-CM | POA: Diagnosis not present

## 2017-12-15 DIAGNOSIS — Z79899 Other long term (current) drug therapy: Secondary | ICD-10-CM | POA: Diagnosis not present

## 2017-12-15 DIAGNOSIS — S0990XA Unspecified injury of head, initial encounter: Secondary | ICD-10-CM | POA: Diagnosis not present

## 2017-12-15 DIAGNOSIS — Z7982 Long term (current) use of aspirin: Secondary | ICD-10-CM | POA: Diagnosis not present

## 2017-12-15 DIAGNOSIS — R262 Difficulty in walking, not elsewhere classified: Secondary | ICD-10-CM | POA: Diagnosis not present

## 2017-12-15 DIAGNOSIS — R269 Unspecified abnormalities of gait and mobility: Secondary | ICD-10-CM | POA: Diagnosis not present

## 2017-12-15 DIAGNOSIS — I2510 Atherosclerotic heart disease of native coronary artery without angina pectoris: Secondary | ICD-10-CM | POA: Diagnosis not present

## 2017-12-15 DIAGNOSIS — M47897 Other spondylosis, lumbosacral region: Secondary | ICD-10-CM | POA: Diagnosis not present

## 2017-12-15 DIAGNOSIS — M6281 Muscle weakness (generalized): Secondary | ICD-10-CM | POA: Diagnosis not present

## 2017-12-15 DIAGNOSIS — R488 Other symbolic dysfunctions: Secondary | ICD-10-CM | POA: Diagnosis not present

## 2017-12-15 DIAGNOSIS — R296 Repeated falls: Secondary | ICD-10-CM | POA: Diagnosis not present

## 2017-12-15 DIAGNOSIS — E785 Hyperlipidemia, unspecified: Secondary | ICD-10-CM | POA: Diagnosis not present

## 2017-12-15 DIAGNOSIS — Z743 Need for continuous supervision: Secondary | ICD-10-CM | POA: Diagnosis not present

## 2017-12-15 DIAGNOSIS — Z8673 Personal history of transient ischemic attack (TIA), and cerebral infarction without residual deficits: Secondary | ICD-10-CM | POA: Diagnosis not present

## 2017-12-15 DIAGNOSIS — R279 Unspecified lack of coordination: Secondary | ICD-10-CM | POA: Diagnosis not present

## 2017-12-15 DIAGNOSIS — G250 Essential tremor: Secondary | ICD-10-CM | POA: Diagnosis not present

## 2017-12-15 DIAGNOSIS — G9340 Encephalopathy, unspecified: Secondary | ICD-10-CM | POA: Diagnosis not present

## 2017-12-15 DIAGNOSIS — E876 Hypokalemia: Secondary | ICD-10-CM | POA: Diagnosis not present

## 2017-12-15 DIAGNOSIS — E039 Hypothyroidism, unspecified: Secondary | ICD-10-CM | POA: Diagnosis not present

## 2017-12-15 DIAGNOSIS — M1990 Unspecified osteoarthritis, unspecified site: Secondary | ICD-10-CM | POA: Diagnosis not present

## 2017-12-15 LAB — GLUCOSE, CAPILLARY: GLUCOSE-CAPILLARY: 104 mg/dL — AB (ref 65–99)

## 2017-12-15 NOTE — Progress Notes (Signed)
Occupational Therapy Evaluation Patient Details Name: Kimberly Bass MRN: [id number] DOB: 26-Mar-1926 Today's Date: 12/15/2017    History of Present Illness Patient is a 84 y/o female presenting to the ED with confusion and fall. Admitted with Acute encephalopathy. Noncontrast head CT is notable for right occipital laceration, but no skull fracture or hemorrhage.  Cervical spine CT is negative for fracture. Patient with a PMH significant for hypertension, hypothyroidism, coronary artery disease, and history of TIA.   Clinical Impression   PTA, pt was in Independent Living apt. Pt currently requires min A for mobility and ADL @ RW level and is a high fall risk. Pt will benefit form OT at snf to facilitate return to PLOF. All further OT to be addressed at SNF.     Follow Up Recommendations  SNF;Supervision/Assistance - 24 hour    Equipment Recommendations  Other (comment)(TBA a SNF)    Recommendations for Other Services       Precautions / Restrictions Precautions Precautions: Fall Restrictions Weight Bearing Restrictions: No      Mobility Bed Mobility   General bed mobility comments: OOB  Transfers Overall transfer level: Needs assistance Equipment used: Rolling walker (2 wheeled) Transfers: Sit to/from [facility] Sit to Stand: Min assist Stand pivot transfers: Min assist       General transfer comment: for RW management and safety; increased assistance when fatigued    Balance Overall balance assessment: Needs assistance Sitting-balance support: Feet supported Sitting balance-Leahy Scale: Bass     Standing balance support: Bilateral upper extremity supported;During functional activity Standing balance-Leahy Scale: Poor                             ADL either performed or assessed with clinical judgement   ADL Overall ADL's : Needs assistance/impaired                                     Functional mobility  during ADLs: Minimal assistance;Rolling walker;Cueing for safety; pt met coming out of bathroom, holding onto counter - prevented fall, and ambulated pt back to chair with min A - poor safety awareness regaridng need for A with mobility General ADL Comments: overall min A for bathing/dressing adn set up/S for grooing in sitting; Min A for safe toilet trnasfers     Vision   Additional Comments: will further assess     Perception     Praxis      Pertinent Vitals/Pain Pain Assessment: No/denies pain     Hand Dominance Right   Extremity/Trunk Assessment Upper Extremity Assessment Upper Extremity Assessment: Generalized weakness   Lower Extremity Assessment Lower Extremity Assessment: Generalized weakness   Cervical / Trunk Assessment Cervical / Trunk Assessment: Normal   Communication Communication Communication: No difficulties   Cognition Arousal/Alertness: Awake/alert Behavior During Therapy: WFL for tasks assessed/performed Overall Cognitive Status: No family/caregiver present to determine baseline cognitive functioning                                 General Comments: Pt with appaent confusion - assisted from bathroom to chair and was looking for her clothes which were "across the road in the closet"; oriented to month adn year but unable to stae why she was in the hospital   General Comments  Exercises     Shoulder Instructions      Home Living Family/patient expects to be discharged to:: Skilled nursing facility Living Arrangements: Alone Available Help at Discharge: Neighbor;Available 24 hours/day Type of Home: Assisted living Home Access: Elevator     Home Layout: One level     Bathroom Shower/Tub: Tub/shower unit         Home Equipment: Environmental consultant - 4 wheels;Grab bars - tub/shower          Prior Functioning/Environment Level of Independence: Independent with assistive device(s)        Comments: uses rollator for all mobility  wihtin the home and community; family brings groceries, only does microwavable meals, has cleaning lady 1x/month for house cleaning and 2x/month for laundry        OT Problem List: Decreased strength;Decreased activity tolerance;Impaired balance (sitting and/or standing);Decreased cognition;Decreased safety awareness;Decreased knowledge of use of DME or AE;Decreased knowledge of precautions      OT Treatment/Interventions:      OT Goals(Current goals can be found in the care plan section) Acute Rehab OT Goals Patient Stated Goal: to find her clothes OT Goal Formulation: With patient  OT Frequency:     Barriers to D/C:            Co-evaluation              AM-PAC PT "6 Clicks" Daily Activity     Outcome Measure Help from another person eating meals?: None Help from another person taking care of personal grooming?: A Little Help from another person toileting, which includes using toliet, bedpan, or urinal?: A Little Help from another person bathing (including washing, rinsing, drying)?: A Little Help from another person to put on and taking off regular upper body clothing?: A Little Help from another person to put on and taking off regular lower body clothing?: A Little 6 Click Score: 19   End of Session Equipment Utilized During Treatment: Gait belt;Rolling walker Nurse Communication: Mobility status;Other (comment)(need for chair alarm)  Activity Tolerance: Patient tolerated treatment well Patient left: in chair;with call bell/phone within reach;with chair alarm set  OT Visit Diagnosis: Unsteadiness on feet (R26.81);Other abnormalities of gait and mobility (R26.89);Muscle weakness (generalized) (M62.81);History of falling (Z91.81);Other symptoms and signs involving cognitive function                Time: 1032-1050 OT Time Calculation (min): 18 min Charges:  OT General Charges $OT Visit: 1 Visit OT Evaluation $OT Eval Moderate Complexity: 1 Mod G-Codes:     Maurie Boettcher, OT/L  OT Clinical Specialist [phone]   [facility] 12/15/2017, 12:04 PM

## 2017-12-15 NOTE — Plan of Care (Addendum)
Ms. Troy SineBillings is a likely discharge when rehab space is available.  She is calm, cooperative and pleasant.  Nursing plan of care can progress to the rehab setting:  optimizing mobility, balance, safety and management of ADLs.  My understanding is that the patient does not have family support locally, so I encourage PT/OT followup.  The patient asked appropriate questions about rehab and is willing to do what she needs to do to safely return home.  Aside from mild forgetfulness, he shows no signs or symptoms of distress at this time.  Addendum 18:14 Pt discharged [facility] via [facility].  Expected discharge at 3, patient just left her room.  Shows no signs or symptoms of distress at this time.

## 2017-12-15 NOTE — Clinical Social Work Placement (Addendum)
Nurse to call report to [phone], Room 136  Transport set for 3:00 PM.     CLINICAL SOCIAL WORK PLACEMENT  NOTE  Date:  12/15/2017  Patient Details  Name: Kimberly Bass MRN: [id number] Date of Birth: 11/10/1925  Clinical Social Work is seeking post-discharge placement for this patient at the Skilled  Nursing Facility level of care (*CSW will initial, date and re-position this form in  chart as items are completed):  Yes   Patient/family provided with [facility] Clinical Social Work Department's list of facilities offering this level of care within the geographic area requested by the patient (or if unable, by the patient's family).  Yes   Patient/family informed of their freedom to choose among providers that offer the needed level of care, that participate in Medicare, Medicaid or managed care program needed by the patient, have an available bed and are willing to accept the patient.  Yes   Patient/family informed of [facility]'s ownership interest [facility], as well as of the fact that they are under no obligation to receive care at these facilities.  PASRR submitted to EDS on 12/14/17     PASRR number received on 12/15/17     Existing PASRR number confirmed on       FL2 transmitted to all facilities in geographic area requested by pt/family on 12/14/17     FL2 transmitted to all facilities within larger geographic area on       Patient informed that his/her managed care company has contracts with or will negotiate with certain facilities, including the following:        Yes   Patient/family informed of bed offers received.  [facility]     Physician recommends and patient chooses bed at      Patient to [facility] on 12/15/17.  Patient to be transferred to facility by [facility]     Patient family notified on 12/15/17 of transfer.  Name of family member notified:  Onalee Huaavid      PHYSICIAN       Additional Comment:    _______________________________________________ Baldemar LenisElizabeth M Cherylee Rawlinson, LCSW 12/15/2017, 1:06 PM

## 2017-12-15 NOTE — Progress Notes (Signed)
CSW following for discharge plan. CSW met with patient and patient's granddaughter earlier this afternoon, walked with patient in the room, and she acknowledged that she is weak and not able to move as well as she had before. Patient acknowledged that she would benefit from rehab, and family said they'd be ok with that if that's what she wanted.   CSW sent referral to [facility] and confirmed bed availability. [facility] website has been down all afternoon, unable to get patient's PASRR. CSW alerted MD. Patient will need PASRR prior to admission to SNF.  CSW to follow.  Laveda Abbe, [place] Clinical Social Worker [phone]

## 2017-12-15 NOTE — Progress Notes (Signed)
Physical Therapy Treatment Patient Details Name: Kimberly Bass MRN: [id number] DOB: [birth year] Today's Date: 12/15/2017    History of Present Illness Patient is a 84 y/o female presenting to the ED with confusion and fall. Admitted with Acute encephalopathy. Noncontrast head CT is notable for right occipital laceration, but no skull fracture or hemorrhage.  Cervical spine CT is negative for fracture. Patient with a PMH significant for hypertension, hypothyroidism, coronary artery disease, and history of TIA.    PT Comments    Patient initially confused this morning, but agreeable to PT session this AM. Patient continues to require Min A for mobility and transfers today for overall safety and stability. At one point patient stating, "I'm not ready to live by myself yet." Short ambulation distance to restroom with min guard for safety and patients fear of falling. Requires consistent verbal cueing for obstacle navigation, safety with RW as well as upright posturing. PT recommendations continue to be appropriate.   Follow Up Recommendations  SNF;Supervision/Assistance - 24 hour     Equipment Recommendations  None recommended by PT    Recommendations for Other Services       Precautions / Restrictions Precautions Precautions: Fall Restrictions Weight Bearing Restrictions: No    Mobility  Bed Mobility Overal bed mobility: Needs Assistance Bed Mobility: Sit to Supine       Sit to supine: Min assist   General bed mobility comments: patient found half-sitting in bed, LE over the edge, but with bedside table in front of patient. Pateint confused on how she is supposed to eat her breakfast. Initially assisting back to bed, but then pateint wanting to transfer to recliner. Min A for LE management and to come to full upright position  Transfers Overall transfer level: Needs assistance Equipment used: Rolling walker (2 wheeled) Transfers: Sit to/from [employer] Sit to Stand: Min guard;Min assist Stand pivot transfers: Min guard;Min assist       General transfer comment: Min A for RW management and for general stability as patient with tremors (this is her baseline per patient)  Ambulation/Gait Ambulation/Gait assistance: Min guard Gait Distance (Feet): 20 Feet Assistive device: Rolling walker (2 wheeled) Gait Pattern/deviations: Step-to pattern;Decreased stride length;Trunk flexed;Wide base of support Gait velocity: decreased   General Gait Details: unsteady gait pattern with patient verbalizing fear of falling   Stairs             Wheelchair Mobility    Modified Rankin (Stroke Patients Only)       Balance Overall balance assessment: Needs assistance Sitting-balance support: Feet supported Sitting balance-Leahy Scale: Fair     Standing balance support: Bilateral upper extremity supported;During functional activity Standing balance-Leahy Scale: Poor                              Cognition Arousal/Alertness: Awake/alert Behavior During Therapy: WFL for tasks assessed/performed Overall Cognitive Status: No family/caregiver present to determine baseline cognitive functioning                                 General Comments: patient reports she is confused and asked multiple times what she is supposed to be doing      Exercises      General Comments        Pertinent Vitals/Pain Pain Assessment: No/denies pain    Home Living  Prior Function            PT Goals (current goals can now be found in the care plan section) Acute Rehab PT Goals Patient Stated Goal: none stated PT Goal Formulation: With patient Time For Goal Achievement: 12/27/17 Potential to Achieve Goals: Good Progress towards PT goals: Progressing toward goals    Frequency    Min 3X/week      PT Plan Current plan remains appropriate    Co-evaluation               AM-PAC PT "6 Clicks" Daily Activity  Outcome Measure  Difficulty turning over in bed (including adjusting bedclothes, sheets and blankets)?: A Lot Difficulty moving from lying on back to sitting on the side of the bed? : Unable Difficulty sitting down on and standing up from a chair with arms (e.g., wheelchair, bedside commode, etc,.)?: Unable Help needed moving to and from a bed to chair (including a wheelchair)?: A Little Help needed walking in hospital room?: A Little Help needed climbing 3-5 steps with a railing? : A Lot 6 Click Score: 12    End of Session Equipment Utilized During Treatment: Gait belt Activity Tolerance: Patient tolerated treatment well Patient left: in chair;with call bell/phone within reach Nurse Communication: Mobility status PT Visit Diagnosis: Unsteadiness on feet (R26.81);Other abnormalities of gait and mobility (R26.89);History of falling (Z91.81);Muscle weakness (generalized) (M62.81)     Time: [id number] PT Time Calculation (min) (ACUTE ONLY): 28 min  Charges:  $Gait Training: 8-22 mins $Therapeutic Activity: 8-22 mins                    G Codes:        Kipp LaurenceStephanie R Evelina Lore, PT, DPT 12/15/17 11:56 AM

## 2017-12-15 NOTE — Care Management Note (Signed)
Case Management Note  Patient Details  Name: Kimberly Bass MRN: [id number] Date of Birth: 09/08/1925  Subjective/Objective:                    Action/Plan: [facility]. CM signing off.   Expected Discharge Date:  12/14/17               Expected Discharge Plan:  Skilled Nursing Facility  In-House Referral:  Clinical Social Work  Discharge planning Services     Post Acute Care Choice:    Choice offered to:     DME Arranged:    DME Agency:     HH Arranged:    HH Agency:     Status of Service:  Completed, signed off  If discussed at [employer] Meetings, dates discussed:    Additional Comments:  [name], RN 12/15/2017, 1:01 PM

## 2017-12-15 NOTE — Progress Notes (Signed)
Seen and examined, vital signs stable-no major events overnight.  Discharge summary done yesterday-okay to discharge today to SNF.  No Changes to patient's medication as outlined in patient's discharge summary.

## 2017-12-16 ENCOUNTER — Non-Acute Institutional Stay (SKILLED_NURSING_FACILITY): Payer: Medicare Other | Admitting: Internal Medicine

## 2017-12-16 ENCOUNTER — Encounter: Payer: Self-pay | Admitting: Internal Medicine

## 2017-12-16 DIAGNOSIS — E876 Hypokalemia: Secondary | ICD-10-CM | POA: Diagnosis not present

## 2017-12-16 DIAGNOSIS — G9340 Encephalopathy, unspecified: Secondary | ICD-10-CM

## 2017-12-16 DIAGNOSIS — E039 Hypothyroidism, unspecified: Secondary | ICD-10-CM | POA: Diagnosis not present

## 2017-12-16 DIAGNOSIS — Z8673 Personal history of transient ischemic attack (TIA), and cerebral infarction without residual deficits: Secondary | ICD-10-CM | POA: Diagnosis not present

## 2017-12-16 DIAGNOSIS — I2510 Atherosclerotic heart disease of native coronary artery without angina pectoris: Secondary | ICD-10-CM

## 2017-12-16 DIAGNOSIS — I10 Essential (primary) hypertension: Secondary | ICD-10-CM

## 2017-12-16 NOTE — Progress Notes (Signed)
Location:    [facility] Room Number: 136/P Place of Service:  SNF [phone]) Provider:  Meyer Russel, MD  Patient Care Team: Merri Brunette, MD as PCP - General (Internal Medicine)  Extended Emergency Contact Information Primary Emergency Contact: Blythe Stanford [place] Phone: [phone] Relation: Son Secondary Emergency Contact: Gregery Na [place] of [place] Mobile Phone: [phone] Relation: Relative  Code Status:  Full Code Goals of care: Advanced Directive information Advanced Directives 12/16/2017  Does Patient Have a Medical Advance Directive? Yes  Type of Advance Directive (No Data)  Does patient want to make changes to medical advance directive? No - Patient declined  Copy of Healthcare Power of Attorney in Chart? -  Would patient like information on creating a medical advance directive? No - Patient declined     Chief Complaint  Patient presents with  . Hospitalization Follow-up    Hospitalization F/U Visit  Status post hospitalization for acute encephalopathy-status post fall  HPI:  Pt is a 84 y.o. female seen today for a hospital f/u after admission for acute encephalopathy after a fall  Patient apparently fell at her assisted living facility and found to be confused- CT of the head was negative for any acute abnormality as well as cervical spine studies-urinalysis did not suggest infection.  Thought possibly to be a mild TIA or possibly medication related- her baclofen was held as well as Neurontin--r baclofen continues to be held.  Blood work was unremarkable-and her mental status has returned to baseline  It appears she is essentially here for rehab and strengthening with hopes to return back to her assisted living facility.  Blood work did reveal mild hypokalemia which was supplemented and her diuretic was held   Appears her magnesium also was slightly low but this is now normal  She  does have a history of TIA and is on Plavix and aspirin.  She also has a history of hypothyroidism is on Synthroid TSH in the hospital was within normal range.   She  also has a history of hypertension is on numerous agents including Norvasc-Imdur- and Lopressor- blood pressure today stable at 132/71- reading yesterday was a bit elevated with systolics in the 140s at this point will monitor  Currently she has no complaints she is sitting in her chair comfortably she already is working with therapy--I suspect her stay here will be fairly short      Past Medical History:  Diagnosis Date  . Arthritis    "mild; in all my joints" (04/27/2017)  . Benign essential tremor   . Coronary artery disease   . Cystitis with hematuria 04/26/2017  . Gait disorder   . H/O radioactive iodine thyroid ablation   . Hx of transient ischemic attack (TIA)   . Hyperlipidemia   . Hypertension    "not since MI in 2007" (04/27/2017)  . Hyperthyroidism    H/O radioactive iodine thyroid ablation [Z92.3]  . Hypothyroidism   . Lower extremity myoclonus   . Lumbosacral spondylosis   . Migraine    "I've had one in my lifetime" (04/27/2017)  . Mild obesity   . Myocardial infarction (HCC) 12/2005  . Myoclonus    Lower extremity involvement  . Obesity   . Pneumonia    "lots when I was a small child; not at all since" (04/27/2017)  . Seizures (HCC)    History is questionable  . Thyroid disease    Past Surgical History:  Procedure  Laterality Date  . ABDOMINAL HYSTERECTOMY  1972   for uterine fibroids/notes 11/10/2010  . ANTERIOR CERVICAL DECOMP/DISCECTOMY FUSION  03/2004   [name] 11/10/2010  . APPENDECTOMY    . BACK SURGERY    . BREAST BIOPSY Right 1996   nodule resected/notes 11/10/2010  . CARDIAC CATHETERIZATION  01/03/2006  . CATARACT EXTRACTION W/ INTRAOCULAR LENS  IMPLANT, BILATERAL Bilateral   . COLONOSCOPY  11/2003   [name] 11/10/2010  . ESOPHAGOGASTRODUODENOSCOPY  09/2006   [name] 11/10/2010    . LUMBAR DISC SURGERY  1990s X 1; 05/2003   "a nerve was pinching"; [name] 11/10/2010  . OVARY SURGERY Left 1999   benign tumor resected /notes 11/10/2010  . TONSILLECTOMY  ~ 1947    Allergies  Allergen Reactions  . Codeine Nausea Only  . Meperidine Hcl Nausea Only  . Morphine Nausea Only  . Penicillins Nausea Only    Has patient had a PCN reaction causing immediate rash, facial/tongue/throat swelling, SOB or lightheadedness with hypotension: Unk Has patient had a PCN reaction causing severe rash involving mucus membranes or skin necrosis: Unk Has patient had a PCN reaction that required hospitalization: Unk Has patient had a PCN reaction occurring within the last 10 years: Unk If all of the above answers are "NO", then may proceed with Cephalosporin use.     Allergies as of 12/16/2017      Reactions   Codeine Nausea Only   Meperidine Hcl Nausea Only   Morphine Nausea Only   Penicillins Nausea Only   Has patient had a PCN reaction causing immediate rash, facial/tongue/throat swelling, SOB or lightheadedness with hypotension: Unk Has patient had a PCN reaction causing severe rash involving mucus membranes or skin necrosis: Unk Has patient had a PCN reaction that required hospitalization: Unk Has patient had a PCN reaction occurring within the last 10 years: Unk If all of the above answers are "NO", then may proceed with Cephalosporin use.      Medication List        Accurate as of 12/16/17 11:20 AM. Always use your most recent med list.          amLODipine 5 MG tablet Commonly known as:  NORVASC Take 5 mg by mouth daily.   aspirin 81 MG tablet Take 81 mg by mouth daily.   CALCIUM + D PO Take 1 tablet by mouth 2 (two) times daily.   clopidogrel 75 MG tablet Commonly known as:  PLAVIX TAKE 1 TABLET BY MOUTH  DAILY   gabapentin 400 MG capsule Commonly known as:  NEURONTIN Take 1 capsule (400 mg total) by mouth 4 (four) times daily.   isosorbide mononitrate 60 MG  24 hr tablet Commonly known as:  IMDUR TAKE 1 TABLET BY MOUTH  DAILY   levothyroxine 75 MCG tablet Commonly known as:  SYNTHROID, LEVOTHROID Take 75 mcg by mouth daily.   metoprolol tartrate 100 MG tablet Commonly known as:  LOPRESSOR TAKE 1 TABLET BY MOUTH TWO  TIMES DAILY   mirabegron ER 50 MG Tb24 tablet Commonly known as:  MYRBETRIQ Take 50 mg by mouth 2 (two) times daily.   multivitamin tablet Take 1 tablet by mouth daily.   NITROSTAT 0.4 MG SL tablet Generic drug:  nitroGLYCERIN DISSOLVE ONE TABLET UNDER THE TONGUE EVERY 5 MINUTES AS NEEDED FOR CHEST PAIN.  DO NOT EXCEED A TOTAL OF 3 DOSES IN 15 MINUTES   ranitidine 150 MG tablet Commonly known as:  ZANTAC Take 150 mg by mouth 2 (two) times daily.  simvastatin 20 MG tablet Commonly known as:  ZOCOR Take 20 mg by mouth every evening.   traZODone 50 MG tablet Commonly known as:  DESYREL Take 75 mg by mouth at bedtime.   TYLENOL ARTHRITIS PAIN 650 MG CR tablet Generic drug:  acetaminophen Take 650 mg by mouth 2 (two) times daily.   vitamin B-12 1000 MCG tablet Commonly known as:  CYANOCOBALAMIN Take 1,000 mcg by mouth daily.       Review of Systems  In general she has no complaints does not complain of fever chills says she feels she is doing well.  Skin does not complain of rashes or itching  Head ears eyes nose mouth and throat- does not complain of sore throat or visual changes or headache   Respiratory is not complaining of shortness of breath or cough.  Cardiac does not complain of chest pain has quite mild lower extremity edema.  GI does not complain of any abdominal pain nausea vomiting diarrhea or constipation.  .  GU does not complain of dysuria.  Musculoskeletal- has some lower extremity weakness but does not complain of any joint pain    neurologic does not complain of dizziness headache syncope  Psych-does not complain of being anxious or depressed         There is no  immunization history on file for this patient. Pertinent  Health Maintenance Due  Topic Date Due  . DEXA SCAN  01/15/2018 (Originally 12/03/1992)  . PNA vac Low Risk Adult (1 of 2 - PCV13) 01/15/2018 (Originally 12/03/1992)  . INFLUENZA VACCINE  01/26/2018   Fall Risk  11/02/2017  Falls in the past year? Yes  Number falls in past yr: 1  Injury with Fall? Yes  Comment hospital x 3 days  Risk for fall due to : Other (Comment)  Risk for fall due to: Comment sick with UTI   Functional Status Survey:    Vitals:   12/16/17 1113  BP: 132/71  Pulse: 81  Resp: 20  Temp: 97.8 F (36.6 C)  TempSrc: Oral  SpO2: 98%    Physical Exam   In general this is a very pleasant elderly female in no distress sitting comfortably in her chair.  Her skin is warm and dry.  Eyes visual acuity appears to be intact sclera and conjunctive are clear- she has prescription lenses.   Oropharynx is clear mucous membranes moist.    Chest is clear to auscultation there is no labored breathing.  Heart is regular rate and rhythm without murmur gallop or rub she has quite mild lower extremity edema  Abdomen is soft nontender with positive bowel sounds  Musculoskeletal is able to move all extremities x4 with some lower extremity weakness grip strength is strong bilaterally-.  Neurologic is grossly intact her speech is clear no lateralizing findings appears to have a mild upper extremity tremor at times-  Psych she is alert and oriented pleasant and appropriate        Labs reviewed: Recent Labs    04/28/17 1116 12/12/17 2250 12/13/17 0217 12/14/17 0615  NA 142 144 143 141  K 3.4* 3.2* 3.2* 3.7  CL 105 102 99* 106  CO2 28 32 33* 29  GLUCOSE 107* 104* 112* 122*  BUN 6 12 9 9   CREATININE 0.59 0.77 0.71 0.63  CALCIUM 8.8* 10.0 10.3 8.9  MG 1.8  --  1.5* 1.9   Recent Labs    12/12/17 2250  AST 28  ALT 19  ALKPHOS 37*  BILITOT 0.9  PROT 6.4*  ALBUMIN 3.8   Recent Labs     12/12/17 2008 12/13/17 0028 12/13/17 0217 12/14/17 0615  WBC 7.7  --  8.4 8.2  NEUTROABS 6.1  --  6.4  --   HGB 13.9  --  15.3* 14.7  HCT 42.9 44.6 47.0* 44.4  MCV 89.2  --  89.0 87.2  PLT PLATELET CLUMPS NOTED ON SMEAR, COUNT APPEARS ADEQUATE  --  160 149*   Lab Results  Component Value Date   TSH 2.114 12/13/2017   No results found for: HGBA1C No results found for: CHOL, HDL, LDLCALC, LDLDIRECT, TRIG, CHOLHDL  Significant Diagnostic Results in last 30 days:  Ct Head Wo Contrast  Result Date: 12/12/2017 CLINICAL DATA:  84 year old female with unwitnessed fall. Laceration posterior head. Initial encounter. EXAM: CT HEAD WITHOUT CONTRAST CT CERVICAL SPINE WITHOUT CONTRAST TECHNIQUE: Multidetector CT imaging of the head and cervical spine was performed following the standard protocol without intravenous contrast. Multiplanar CT image reconstructions of the cervical spine were also generated. COMPARISON:  05/16/2017 CT head and cervical spine. FINDINGS: CT HEAD FINDINGS Brain: No intracranial hemorrhage or CT evidence of large acute infarct. Chronic microvascular changes. Mild global atrophy. No intracranial mass lesion noted on this unenhanced exam. Vascular: Vascular calcifications. Skull: No skull fracture Sinuses/Orbits: No acute orbital abnormality. Mild exophthalmos. Minimal mucosal thickening ethmoid sinus air cells. Polypoid opacification right maxillary sinus. Mastoid air cells and middle ear cavities are clear. Other: Right occipital small scalp laceration. Degenerative changes temporomandibular joint. CT CERVICAL SPINE FINDINGS Alignment: Mild rotation of the head and C1 ring upon C2. Skull base and vertebrae: No cervical spine fracture. Prior fusion C5-C7. Soft tissues and spinal canal: No abnormal prevertebral soft tissue swelling. Transverse ligament hypertrophy calcification. Disc levels: Cervical spondylotic changes with spinal stenosis and cord flattening C3-4 through C6-7. Upper  chest: No worrisome lung apical lesion. Other: Carotid bifurcation calcifications. IMPRESSION: No skull fracture or intracranial hemorrhage. Laceration right occipital region. Chronic microvascular changes. Atrophy. Rotation of the head and C1 ring upon C2 may be related to head positioning. No cervical spine fracture. Prior fusion C5-C7. Cervical spondylotic changes with spinal stenosis and cord flattening C3-4 through C6-7. Electronically Signed   By: Lacy Duverney M.D.   On: 12/12/2017 19:44   Ct Cervical Spine Wo Contrast  Result Date: 12/12/2017 CLINICAL DATA:  84 year old female with unwitnessed fall. Laceration posterior head. Initial encounter. EXAM: CT HEAD WITHOUT CONTRAST CT CERVICAL SPINE WITHOUT CONTRAST TECHNIQUE: Multidetector CT imaging of the head and cervical spine was performed following the standard protocol without intravenous contrast. Multiplanar CT image reconstructions of the cervical spine were also generated. COMPARISON:  05/16/2017 CT head and cervical spine. FINDINGS: CT HEAD FINDINGS Brain: No intracranial hemorrhage or CT evidence of large acute infarct. Chronic microvascular changes. Mild global atrophy. No intracranial mass lesion noted on this unenhanced exam. Vascular: Vascular calcifications. Skull: No skull fracture Sinuses/Orbits: No acute orbital abnormality. Mild exophthalmos. Minimal mucosal thickening ethmoid sinus air cells. Polypoid opacification right maxillary sinus. Mastoid air cells and middle ear cavities are clear. Other: Right occipital small scalp laceration. Degenerative changes temporomandibular joint. CT CERVICAL SPINE FINDINGS Alignment: Mild rotation of the head and C1 ring upon C2. Skull base and vertebrae: No cervical spine fracture. Prior fusion C5-C7. Soft tissues and spinal canal: No abnormal prevertebral soft tissue swelling. Transverse ligament hypertrophy calcification. Disc levels: Cervical spondylotic changes with spinal stenosis and cord  flattening C3-4 through C6-7. Upper chest: No worrisome lung  apical lesion. Other: Carotid bifurcation calcifications. IMPRESSION: No skull fracture or intracranial hemorrhage. Laceration right occipital region. Chronic microvascular changes. Atrophy. Rotation of the head and C1 ring upon C2 may be related to head positioning. No cervical spine fracture. Prior fusion C5-C7. Cervical spondylotic changes with spinal stenosis and cord flattening C3-4 through C6-7. Electronically Signed   By: Lacy DuverneySteven  Olson M.D.   On: 12/12/2017 19:44    Assessment/Plan  : #1- history of encephalopathy-unclear etiology CT scan of the head was negative as well as urinalysis- her baclofen was discontinued and Neurontin was held at one point but has been restarted.   She appears to be stable and back at her baseline.  It was thought possibly confusion was caused by medications or possibly   a transitory small TIA  Per review of previous notes it appears she is been seen by neurology with a history of myoclonic jerking of her legs as well as essential tremor-and this is why she is on the baclofen and Neurontin-- baclofen is currently on hold    #2-  History of coronary artery disease-TIAs- this appears stable on Plavix and aspirin she is currently asymptomatic she is also in on a statin  3.-  Hypokalemia this was supplemented and is normalized I note her magnesium was also somewhat low but has normalized --we will update this to ensure stability her diuretic has been discontinued  4.  Hypertension she is on Norvasc as well as Imdur and Lopressor- at this point appears relatively stable but we have fairly  Minimal  readings will monitor for now  #5- thyroidism she is on Synthroid TSH was within normal limits 2.114 in the hospital  #6- history of B12 deficiency-level was adequate in the hospital she is on supplementation.  7.-  Insomnia-she is on trazodone apparently has tolerated this well.  8.  History of  osteoarthritis she is on Tylenol arthritis currently.  9.  History of urinary frequency she is on Myrbetriq  Currently patient appears stable with no complaints again I suspect her stay here will be fairly short will update a metabolic panel keep an eye on her potassium level  CPT--99310- of note greater than 35 minutes spent assessing patient-reviewing her chart and labs- coordinating and formulating a plan of care for numerous diagnoses of note greater than 50% of time spent coordinating plan of care

## 2017-12-19 ENCOUNTER — Encounter: Payer: Self-pay | Admitting: Internal Medicine

## 2017-12-19 ENCOUNTER — Non-Acute Institutional Stay (SKILLED_NURSING_FACILITY): Payer: Medicare Other | Admitting: Internal Medicine

## 2017-12-19 ENCOUNTER — Encounter (HOSPITAL_COMMUNITY)
Admission: RE | Admit: 2017-12-19 | Discharge: 2017-12-19 | Disposition: A | Discharge status: Home / Self Care | Payer: Medicare Other | Source: Skilled Nursing Facility | Attending: Internal Medicine | Admitting: Internal Medicine

## 2017-12-19 DIAGNOSIS — I10 Essential (primary) hypertension: Secondary | ICD-10-CM

## 2017-12-19 DIAGNOSIS — G9340 Encephalopathy, unspecified: Secondary | ICD-10-CM | POA: Diagnosis not present

## 2017-12-19 DIAGNOSIS — G253 Myoclonus: Secondary | ICD-10-CM

## 2017-12-19 DIAGNOSIS — I2510 Atherosclerotic heart disease of native coronary artery without angina pectoris: Secondary | ICD-10-CM | POA: Diagnosis not present

## 2017-12-19 DIAGNOSIS — E039 Hypothyroidism, unspecified: Secondary | ICD-10-CM | POA: Diagnosis not present

## 2017-12-19 DIAGNOSIS — R269 Unspecified abnormalities of gait and mobility: Secondary | ICD-10-CM | POA: Diagnosis not present

## 2017-12-19 DIAGNOSIS — E782 Mixed hyperlipidemia: Secondary | ICD-10-CM | POA: Diagnosis not present

## 2017-12-19 DIAGNOSIS — G250 Essential tremor: Secondary | ICD-10-CM | POA: Diagnosis not present

## 2017-12-19 DIAGNOSIS — R296 Repeated falls: Secondary | ICD-10-CM

## 2017-12-19 LAB — BASIC METABOLIC PANEL
ANION GAP: 9 (ref 5–15)
BUN: 13 mg/dL (ref 6–20)
CALCIUM: 8.8 mg/dL — AB (ref 8.9–10.3)
CO2: 28 mmol/L (ref 22–32)
Chloride: 100 mmol/L — ABNORMAL LOW (ref 101–111)
Creatinine, Ser: 0.61 mg/dL (ref 0.44–1.00)
GLUCOSE: 145 mg/dL — AB (ref 65–99)
POTASSIUM: 3.5 mmol/L (ref 3.5–5.1)
SODIUM: 137 mmol/L (ref 135–145)

## 2017-12-19 NOTE — Progress Notes (Signed)
Provider:Anjali Chales AbrahamsGupta   Location:   [facility] Room Number: 163/P Place of Service:  SNF (31)  PCP: Merri BrunettePharr, Walter, MD Patient Care Team: Merri BrunettePharr, Walter, MD as PCP - General (Internal Medicine)  Extended Emergency Contact Information Primary Emergency Contact: Blythe StanfordWyrick,David  [place] Mobile Phone: [phone] Relation: Son Secondary Emergency Contact: Gregery NaWyrick,"Sam"  [place] Mobile Phone: [phone] Relation: Relative  Code Status: Full Code Goals of Care: Advanced Directive information Advanced Directives 12/19/2017  Does Patient Have a Medical Advance Directive? Yes  Type of Advance Directive (No Data)  Does patient want to make changes to medical advance directive? No - Patient declined  Copy of Healthcare Power of Attorney in Chart? -  Would patient like information on creating a medical advance directive? No - Patient declined      Chief Complaint  Patient presents with  . New Admit To SNF    New Admission Visit    HPI: Patient is a 84 y.o. female seen today for admission to SNF for treatment after staying in the hospital from 06/17-06/20 after sustaining Fall and ? Confusion.  Patient has history of hypertension, hypothyroidism, CAD, H/o TIA, Myoclonic Jerks, Gait  Impairment Patient lives in ALF.  And per patient she was locked out of her.  Room and then she lost her balance and fell.  When the facility she was also confused.  Her work-up including CT of the head and cervical spine was negative for any acute changes.  Her rest of the work-up was negative too. Her confusion was thought to be due to baclofen and it was discontinued. Patient is starting the facility for therapy Patient denies any problems today.  She was little upset because she has been on baclofen for more than 10 years and wants to restart it. She denies any fever, chills, cough or shortness of Breadth.  she lives in an apartment and is very  independent with her ADLS. She takes care of her medicine.  She has a son who lives nearby and helps her with driving   Past Medical History:  Diagnosis Date  . Arthritis    "mild; in all my joints" (04/27/2017)  . Benign essential tremor   . Coronary artery disease   . Cystitis with hematuria 04/26/2017  . Gait disorder   . H/O radioactive iodine thyroid ablation   . Hx of transient ischemic attack (TIA)   . Hyperlipidemia   . Hypertension    "not since MI in 2007" (04/27/2017)  . Hyperthyroidism    H/O radioactive iodine thyroid ablation [Z92.3]  . Hypothyroidism   . Lower extremity myoclonus   . Lumbosacral spondylosis   . Migraine    "I've had one in my lifetime" (04/27/2017)  . Mild obesity   . Myocardial infarction (HCC) 12/2005  . Myoclonus    Lower extremity involvement  . Obesity   . Pneumonia    "lots when I was a small child; not at all since" (04/27/2017)  . Seizures (HCC)    History is questionable  . Thyroid disease    Past Surgical History:  Procedure Laterality Date  . ABDOMINAL HYSTERECTOMY  1972   for uterine fibroids/notes 11/10/2010  . ANTERIOR CERVICAL DECOMP/DISCECTOMY FUSION  03/2004   [name]/notes 11/10/2010  . APPENDECTOMY    . BACK SURGERY    . BREAST BIOPSY Right 1996   nodule resected/notes 11/10/2010  . CARDIAC CATHETERIZATION  01/03/2006  . CATARACT EXTRACTION W/ INTRAOCULAR LENS  IMPLANT,  BILATERAL Bilateral   . COLONOSCOPY  11/2003   [name] 11/10/2010  . ESOPHAGOGASTRODUODENOSCOPY  09/2006   [name] 11/10/2010  . LUMBAR DISC SURGERY  1990s X 1; 05/2003   "a nerve was pinching"; [name] 11/10/2010  . OVARY SURGERY Left 1999   benign tumor resected /notes 11/10/2010  . TONSILLECTOMY  ~ 1947    reports that she has never smoked. She has never used smokeless tobacco. She reports that she does not drink alcohol or use drugs. Social History   Socioeconomic History  . Marital status: Widowed    Spouse name: Not on file  . Number of children: 1    . Years of education: HS  . Highest education level: Not on file  Occupational History  . Occupation: retired    Associate Professor: RETIRED  Social Needs  . Financial resource strain: Not on file  . Food insecurity:    Worry: Not on file    Inability: Not on file  . Transportation needs:    Medical: Not on file    Non-medical: Not on file  Tobacco Use  . Smoking status: Never Smoker  . Smokeless tobacco: Never Used  Substance and Sexual Activity  . Alcohol use: No  . Drug use: No  . Sexual activity: Never  Lifestyle  . Physical activity:    Days per week: Not on file    Minutes per session: Not on file  . Stress: Not on file  Relationships  . Social connections:    Talks on phone: Not on file    Gets together: Not on file    Attends religious service: Not on file    Active member of club or organization: Not on file    Attends meetings of clubs or organizations: Not on file    Relationship status: Not on file  . Intimate partner violence:    Fear of current or ex partner: Not on file    Emotionally abused: Not on file    Physically abused: Not on file    Forced sexual activity: Not on file  Other Topics Concern  . Not on file  Social History Narrative   Lives at [facility]   Patient is left handed, but has had to adapt to using her right hand lately.   Patient drinks 1/2 cup of caffeine each morning.    Functional Status Survey:    Family History  Problem Relation Age of Onset  . Heart disease Father   . Heart attack Mother   . Stroke Sister   . Heart attack Sister   . Heart attack Brother        Possible  . Diabetes Sister     Health Maintenance  Topic Date Due  . DEXA SCAN  01/15/2018 (Originally 12/03/1990)  . TETANUS/TDAP  01/15/2018 (Originally 12/04/1944)  . PNA vac Low Risk Adult (1 of 2 - PCV13) 01/15/2018 (Originally 12/03/1990)  . INFLUENZA VACCINE  01/26/2018    Allergies  Allergen Reactions  . Codeine Nausea Only  .  Meperidine Hcl Nausea Only  . Morphine Nausea Only  . Penicillins Nausea Only    Has patient had a PCN reaction causing immediate rash, facial/tongue/throat swelling, SOB or lightheadedness with hypotension: Unk Has patient had a PCN reaction causing severe rash involving mucus membranes or skin necrosis: Unk Has patient had a PCN reaction that required hospitalization: Unk Has patient had a PCN reaction occurring within the last 10 years: Unk If all of the above answers  are "NO", then may proceed with Cephalosporin use.     Outpatient Encounter Medications as of 12/19/2017  Medication Sig  . acetaminophen (TYLENOL ARTHRITIS PAIN) 650 MG CR tablet Take 650 mg by mouth 2 (two) times daily.   Marland Kitchen amLODipine (NORVASC) 5 MG tablet Take 5 mg by mouth daily.   Marland Kitchen aspirin 81 MG tablet Take 81 mg by mouth daily.    . Calcium Carbonate-Vitamin D (CALCIUM + D PO) Take 1 tablet by mouth 2 (two) times daily.   . clopidogrel (PLAVIX) 75 MG tablet TAKE 1 TABLET BY MOUTH  DAILY  . gabapentin (NEURONTIN) 400 MG capsule Take 1 capsule (400 mg total) by mouth 4 (four) times daily.  . isosorbide mononitrate (IMDUR) 60 MG 24 hr tablet TAKE 1 TABLET BY MOUTH  DAILY  . levothyroxine (SYNTHROID, LEVOTHROID) 75 MCG tablet Take 75 mcg by mouth daily.    . metoprolol tartrate (LOPRESSOR) 100 MG tablet TAKE 1 TABLET BY MOUTH TWO  TIMES DAILY  . mirabegron ER (MYRBETRIQ) 50 MG TB24 tablet Take 50 mg by mouth 2 (two) times daily.   . Multiple Vitamin (MULTIVITAMIN) tablet Take 1 tablet by mouth daily.    Marland Kitchen NITROSTAT 0.4 MG SL tablet DISSOLVE ONE TABLET UNDER THE TONGUE EVERY 5 MINUTES AS NEEDED FOR CHEST PAIN.  DO NOT EXCEED A TOTAL OF 3 DOSES IN 15 MINUTES  . ranitidine (ZANTAC) 150 MG tablet Take 150 mg by mouth 2 (two) times daily.    . simvastatin (ZOCOR) 20 MG tablet Take 20 mg by mouth every evening.   . traZODone (DESYREL) 50 MG tablet Take 75 mg by mouth at bedtime.   . vitamin B-12 (CYANOCOBALAMIN) 1000 MCG  tablet Take 1,000 mcg by mouth daily.   No facility-administered encounter medications on file as of 12/19/2017.      Review of Systems  Review of Systems  Constitutional: Negative for activity change, appetite change, chills, diaphoresis, fatigue and fever.  HENT: Negative for mouth sores, postnasal drip, rhinorrhea, sinus pain and sore throat.   Respiratory: Negative for apnea, cough, chest tightness, shortness of breath and wheezing.   Cardiovascular: Negative for chest pain, palpitations and leg swelling.  Gastrointestinal: Negative for abdominal distention, abdominal pain, constipation, diarrhea, nausea and vomiting.  Genitourinary: Negative for dysuria and frequency.  Musculoskeletal: Negative for arthralgias, joint swelling and myalgias.  Skin: Negative for rash.  Neurological: Negative for dizziness, syncope, weakness, light-headedness and numbness.  Psychiatric/Behavioral: Negative for behavioral problems, confusion and sleep disturbance.     Vitals:   12/19/17 1042  BP: 134/75  Pulse: 85  Resp: 20  Temp: (!) 97.1 F (36.2 C)  TempSrc: Oral   There is no height or weight on file to calculate BMI. Physical Exam  Constitutional: She is oriented to person, place, and time. She appears well-developed and well-nourished.  HENT:  Head: Normocephalic.  Mouth/Throat: Oropharynx is clear and moist.  Eyes: Pupils are equal, round, and reactive to light.  Neck: Neck supple.  Cardiovascular: Normal rate and regular rhythm.  Pulmonary/Chest: Effort normal and breath sounds normal. No stridor. No respiratory distress. She has no wheezes.  Abdominal: Soft. Bowel sounds are normal. She exhibits no distension. There is no tenderness. There is no guarding.  Musculoskeletal: She exhibits no edema.  Neurological: She is alert and oriented to person, place, and time.  Was confused about the name of the place and why she is here No Focal deficits  Skin: Skin is warm and dry.  Psychiatric: She has a normal mood and affect. Her behavior is normal. Thought content normal.    Labs reviewed: Basic Metabolic Panel: Recent Labs    04/28/17 1116  12/13/17 0217 12/14/17 0615 12/19/17 0645  NA 142   < > 143 141 137  K 3.4*   < > 3.2* 3.7 3.5  CL 105   < > 99* 106 100*  CO2 28   < > 33* 29 28  GLUCOSE 107*   < > 112* 122* 145*  BUN 6   < > 9 9 13   CREATININE 0.59   < > 0.71 0.63 0.61  CALCIUM 8.8*   < > 10.3 8.9 8.8*  MG 1.8  --  1.5* 1.9  --    < > = values in this interval not displayed.   Liver Function Tests: Recent Labs    12/12/17 2250  AST 28  ALT 19  ALKPHOS 37*  BILITOT 0.9  PROT 6.4*  ALBUMIN 3.8   No results for input(s): LIPASE, AMYLASE in the last 8760 hours. Recent Labs    12/13/17 0327  AMMONIA 10   CBC: Recent Labs    12/12/17 2008 12/13/17 0028 12/13/17 0217 12/14/17 0615  WBC 7.7  --  8.4 8.2  NEUTROABS 6.1  --  6.4  --   HGB 13.9  --  15.3* 14.7  HCT 42.9 44.6 47.0* 44.4  MCV 89.2  --  89.0 87.2  PLT PLATELET CLUMPS NOTED ON SMEAR, COUNT APPEARS ADEQUATE  --  160 149*   Cardiac Enzymes: No results for input(s): CKTOTAL, CKMB, CKMBINDEX, TROPONINI in the last 8760 hours. BNP: Invalid input(s): POCBNP No results found for: HGBA1C Lab Results  Component Value Date   TSH 2.114 12/13/2017   Lab Results  Component Value Date   VITAMINB12 1,900 (H) 12/13/2017   No results found for: FOLATE No results found for: IRON, TIBC, FERRITIN  Imaging and Procedures obtained prior to SNF admission: No results found.  Assessment/Plan Acute encephalopathy Patient seems to be at her baseline right now. It is hard to know if  there with acute change or just worsening of her dementia. We will continue to monitor her in the facility We will have speech evaluate her  Fall with abnormality of gait Patient follows with neurology and has been stable on Neurontin and baclofen.   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 Patient's family wants to baclofen to be restarted We will restarted at 5 mg BID and titrate as needed  Hypothyroidism TSH normal in the hospital Continue same dose  Essential hypertension Blood pressure controlled on amlodipine And Lopressor CAD Patient asymptomatic On aspirin, Imdur and Plavix and statin  hyperlipidemia Continue Statin Discharge Planning Plan to go back to her Facility.  Family/ staff Communication:   Labs/tests ordered:  Total time spent in this patient care encounter was _45 minutes; greater than 50% of the visit spent counseling patient, reviewing records , Labs and coordinating care for problems addressed at this encounter.

## 2017-12-23 ENCOUNTER — Non-Acute Institutional Stay (SKILLED_NURSING_FACILITY): Payer: Medicare Other | Admitting: Internal Medicine

## 2017-12-23 ENCOUNTER — Encounter: Payer: Self-pay | Admitting: Internal Medicine

## 2017-12-23 DIAGNOSIS — G9340 Encephalopathy, unspecified: Secondary | ICD-10-CM

## 2017-12-23 DIAGNOSIS — R269 Unspecified abnormalities of gait and mobility: Secondary | ICD-10-CM | POA: Diagnosis not present

## 2017-12-23 DIAGNOSIS — I10 Essential (primary) hypertension: Secondary | ICD-10-CM

## 2017-12-23 DIAGNOSIS — I2510 Atherosclerotic heart disease of native coronary artery without angina pectoris: Secondary | ICD-10-CM

## 2017-12-23 DIAGNOSIS — E039 Hypothyroidism, unspecified: Secondary | ICD-10-CM

## 2017-12-23 NOTE — Progress Notes (Signed)
Location:    [facility] Room Number: 136/P Place of Service:  SNF (31)  Provider: Edmon Crape  PCP: Merri Brunette, MD Patient Care Team: Merri Brunette, MD as PCP - General (Internal Medicine)  Extended Emergency Contact Information Primary Emergency Contact: Blythe Stanford [place] Phone: [phone] Relation: Son Secondary Emergency Contact: Gregery Na [place] of [place] Mobile Phone: [phone] Relation: Relative  Code Status: Full Code Goals of care:  Advanced Directive information Advanced Directives 12/23/2017  Does Patient Have a Medical Advance Directive? Yes  Type of Advance Directive (No Data)  Does patient want to make changes to medical advance directive? No - Patient declined  Copy of Healthcare Power of Attorney in Chart? -  Would patient like information on creating a medical advance directive? No - Patient declined     Allergies  Allergen Reactions  . Codeine Nausea Only  . Meperidine Hcl Nausea Only  . Morphine Nausea Only  . Penicillins Nausea Only    Has patient had a PCN reaction causing immediate rash, facial/tongue/throat swelling, SOB or lightheadedness with hypotension: Unk Has patient had a PCN reaction causing severe rash involving mucus membranes or skin necrosis: Unk Has patient had a PCN reaction that required hospitalization: Unk Has patient had a PCN reaction occurring within the last 10 years: Unk If all of the above answers are "NO", then may proceed with Cephalosporin use.     Chief Complaint  Patient presents with  . Discharge Note    Discharge Visit    HPI:  84 y.o. female seen today for discharge from facility 2 days.  She was here for short-term rehab after hospital stay after a fall and increased confusion agraffe she also has a history of hypertension as well as hypothyroidism coronary artery disease a history of TIA and myoclonic jerks impairment Apparently she was  locked out of her room and she lost her balance and fell she was also found to be confused.  Work-up including CT of the head and cervical spine did not show any acute process.  She has gained strength with therapy is back using her rolling walker but still has some weakness.  She has been restarted on baclofen with her history ofmyo clonic jerks-apparently she has been on this for a while   Her only complaint today is some occasional rectal discomfort- she says staff is putting a cream on the area and this is helping she does not complain of any numbness or increased weakness or acute pain.  When I assessed the area she did not complain of any pain  She will be going back to assisted living and will need continued PT and OT  Her other medical issues appear to be stable she does have a history of hypertension is on Norvasc and Lopressor I got a somewhat elevated systolic in the 140s today but she has been up ambulating-review his blood pressure is 128/59-138/67 occasionally she has some spikes above 140  For her history of myoclonic jerking she is on Neurontin in addition to the baclofen.  She also has a history of hypothyroidism and TSH has been within normal limits.  Regards to TIA she continues on aspirin statin and Plavix.  I do note appears at one point she had some mild hypokalemia that was supplemented  and since normalized will update this before discharge    She is looking forward to going back home--she lives in independent living with her son  Past Medical History:  Diagnosis Date  . Arthritis    "mild; in all my joints" (04/27/2017)  . Benign essential tremor   . Coronary artery disease   . Cystitis with hematuria 04/26/2017  . Gait disorder   . H/O radioactive iodine thyroid ablation   . Hx of transient ischemic attack (TIA)   . Hyperlipidemia   . Hypertension    "not since MI in 2007" (04/27/2017)  . Hyperthyroidism    H/O radioactive iodine thyroid  ablation [Z92.3]  . Hypothyroidism   . Lower extremity myoclonus   . Lumbosacral spondylosis   . Migraine    "I've had one in my lifetime" (04/27/2017)  . Mild obesity   . Myocardial infarction (HCC) 12/2005  . Myoclonus    Lower extremity involvement  . Obesity   . Pneumonia    "lots when I was a small child; not at all since" (04/27/2017)  . Seizures (HCC)    History is questionable  . Thyroid disease     Past Surgical History:  Procedure Laterality Date  . ABDOMINAL HYSTERECTOMY  1972   for uterine fibroids/notes 11/10/2010  . ANTERIOR CERVICAL DECOMP/DISCECTOMY FUSION  03/2004   [name] 11/10/2010  . APPENDECTOMY    . BACK SURGERY    . BREAST BIOPSY Right 1996   nodule resected/notes 11/10/2010  . CARDIAC CATHETERIZATION  01/03/2006  . CATARACT EXTRACTION W/ INTRAOCULAR LENS  IMPLANT, BILATERAL Bilateral   . COLONOSCOPY  11/2003   [name] 11/10/2010  . ESOPHAGOGASTRODUODENOSCOPY  09/2006   [name] 11/10/2010  . LUMBAR DISC SURGERY  1990s X 1; 05/2003   "a nerve was pinching"; [name] 11/10/2010  . OVARY SURGERY Left 1999   benign tumor resected /notes 11/10/2010  . TONSILLECTOMY  ~ 1947      reports that she has never smoked. She has never used smokeless tobacco. She reports that she does not drink alcohol or use drugs. Social History   Socioeconomic History  . Marital status: Widowed    Spouse name: Not on file  . Number of children: 1  . Years of education: HS  . Highest education level: Not on file  Occupational History  . Occupation: retired    Associate Professor: RETIRED  Social Needs  . Financial resource strain: Not on file  . Food insecurity:    Worry: Not on file    Inability: Not on file  . Transportation needs:    Medical: Not on file    Non-medical: Not on file  Tobacco Use  . Smoking status: Never Smoker  . Smokeless tobacco: Never Used  Substance and Sexual Activity  . Alcohol use: No  . Drug use: No  . Sexual activity: Never  Lifestyle  . Physical  activity:    Days per week: Not on file    Minutes per session: Not on file  . Stress: Not on file  Relationships  . Social connections:    Talks on phone: Not on file    Gets together: Not on file    Attends religious service: Not on file    Active member of club or organization: Not on file    Attends meetings of clubs or organizations: Not on file    Relationship status: Not on file  . Intimate partner violence:    Fear of current or ex partner: Not on file    Emotionally abused: Not on file    Physically abused: Not on file    Forced sexual activity: Not on file  Other Topics Concern  . Not on file  Social History Narrative   Lives at [facility] facility   Patient is left handed, but has had to adapt to using her right hand lately.   Patient drinks 1/2 cup of caffeine each morning.   Functional Status Survey:    Allergies  Allergen Reactions  . Codeine Nausea Only  . Meperidine Hcl Nausea Only  . Morphine Nausea Only  . Penicillins Nausea Only    Has patient had a PCN reaction causing immediate rash, facial/tongue/throat swelling, SOB or lightheadedness with hypotension: Unk Has patient had a PCN reaction causing severe rash involving mucus membranes or skin necrosis: Unk Has patient had a PCN reaction that required hospitalization: Unk Has patient had a PCN reaction occurring within the last 10 years: Unk If all of the above answers are "NO", then may proceed with Cephalosporin use.     Pertinent  Health Maintenance Due  Topic Date Due  . DEXA SCAN  01/15/2018 (Originally 12/03/1992)  . PNA vac Low Risk Adult (1 of 2 - PCV13) 01/15/2018 (Originally [birth year]/03/1992)  . INFLUENZA VACCINE  01/26/2018    Medications: Outpatient Encounter Medications as of 12/23/2017  Medication Sig  . acetaminophen (TYLENOL ARTHRITIS PAIN) 650 MG CR tablet Take 650 mg by mouth 2 (two) times daily.   Marland Kitchen amLODipine (NORVASC) 5 MG tablet Take 5 mg by mouth daily.   Marland Kitchen aspirin 81  MG tablet Take 81 mg by mouth daily.    . Baclofen 5 MG TABS Take 5 mg by mouth 2 (two) times daily.  . Calcium Carbonate-Vitamin D (CALCIUM + D PO) Take 1 tablet by mouth 2 (two) times daily.   . clopidogrel (PLAVIX) 75 MG tablet TAKE 1 TABLET BY MOUTH  DAILY  . gabapentin (NEURONTIN) 400 MG capsule Take 1 capsule (400 mg total) by mouth 4 (four) times daily.  . isosorbide mononitrate (IMDUR) 60 MG 24 hr tablet TAKE 1 TABLET BY MOUTH  DAILY  . levothyroxine (SYNTHROID, LEVOTHROID) 75 MCG tablet Take 75 mcg by mouth daily.    . metoprolol tartrate (LOPRESSOR) 100 MG tablet TAKE 1 TABLET BY MOUTH TWO  TIMES DAILY  . mirabegron ER (MYRBETRIQ) 50 MG TB24 tablet Take 50 mg by mouth 2 (two) times daily.   . Multiple Vitamin (MULTIVITAMIN) tablet Take 1 tablet by mouth daily.    Marland Kitchen NITROSTAT 0.4 MG SL tablet DISSOLVE ONE TABLET UNDER THE TONGUE EVERY 5 MINUTES AS NEEDED FOR CHEST PAIN.  DO NOT EXCEED A TOTAL OF 3 DOSES IN 15 MINUTES  . ranitidine (ZANTAC) 150 MG tablet Take 150 mg by mouth 2 (two) times daily.    . simvastatin (ZOCOR) 20 MG tablet Take 20 mg by mouth every evening.   . traZODone (DESYREL) 50 MG tablet Take 75 mg by mouth at bedtime.   . vitamin B-12 (CYANOCOBALAMIN) 1000 MCG tablet Take 1,000 mcg by mouth daily.   No facility-administered encounter medications on file as of 12/23/2017.      Review of Systems In general she does not complain of any fever chills says she feels well.  Skin does not complain of rashes or itching.  Head ears eyes nose mouth and throat-she has prescription lenses does not complain of sore throat or visual changes.  Respiratory is not complaining of shortness of breath or cough.  Cardiac denies chest pain has some mild lower extremity edema this appears baseline.  GI is not complaining of abdominal pain nausea vomiting diarrhea  or constipation.  GU does not complain of dysuria.  Musculoskeletal- only complaint is some occasional rectal discomfort  she says this is relieved with a cream.  Neurologic does not complain of dizziness headache or numbness does have a history of myoclonic jerking apparently has not been an issue during her stay here  Psych does not complain of being anxious or depressed does have some mild cognitive deficits according to nursing this is more so later in the evening.  Also apparently has a history of insomnia and continues on trazodone   Physical Exam   She is afebrile pulse of 80 respirations of 17 blood pressure taken manually 148/84 this was after some exertion previous blood pressures as noted above.  In general this is a very pleasant elderly female in no distress sitting comfortably in her wheelchair.  Her skin is warm and dry.--She does have numerous seborrheic keratosis most prominently on her back  Eyes visual acuity appears grossly intact she has prescription lenses sclera and conjunctive are clear.  Oropharynx clear mucous membranes moist  Chest is clear to auscultation there is no labored breathing.  Heart is regular rate and rhythm without murmur gallop or rub.  Abdomen is somewhat obese soft nontender with positive bowel sounds  Rectal- could not really appreciate any abnormalities any discharge increased erythema or tenderness to palpation of the area-I did not note any hemorrhoids  Musculoskeletal is able to move all extremities x4 is able to stand without assistance and use her rolling walker although a bit weak.  Neurologic appears grossly intact her speech is clear no lateralizing findings   psych she is largely alert and oriented but somewhat confused and slow speech at times-can describe to me what her previous living situation was.      Labs reviewed: Basic Metabolic Panel: Recent Labs    04/28/17 1116  12/13/17 0217 12/14/17 0615 12/19/17 0645  NA 142   < > 143 141 137  K 3.4*   < > 3.2* 3.7 3.5  CL 105   < > 99* 106 100*  CO2 28   < > 33* 29 28  GLUCOSE 107*    < > 112* 122* 145*  BUN 6   < > 9 9 13   CREATININE 0.59   < > 0.71 0.63 0.61  CALCIUM 8.8*   < > 10.3 8.9 8.8*  MG 1.8  --  1.5* 1.9  --    < > = values in this interval not displayed.   Liver Function Tests: Recent Labs    12/12/17 2250  AST 28  ALT 19  ALKPHOS 37*  BILITOT 0.9  PROT 6.4*  ALBUMIN 3.8   No results for input(s): LIPASE, AMYLASE in the last 8760 hours. Recent Labs    12/13/17 0327  AMMONIA 10   CBC: Recent Labs    12/12/17 2008 12/13/17 0028 12/13/17 0217 12/14/17 0615  WBC 7.7  --  8.4 8.2  NEUTROABS 6.1  --  6.4  --   HGB 13.9  --  15.3* 14.7  HCT 42.9 44.6 47.0* 44.4  MCV 89.2  --  89.0 87.2  PLT PLATELET CLUMPS NOTED ON SMEAR, COUNT APPEARS ADEQUATE  --  160 149*   Cardiac Enzymes: No results for input(s): CKTOTAL, CKMB, CKMBINDEX, TROPONINI in the last 8760 hours. BNP: Invalid input(s): POCBNP CBG: Recent Labs    12/13/17 0643 12/14/17 0627 12/15/17 0615  GLUCAP 99 126* 104*    Procedures and Imaging Studies During Stay: Ct  Head Wo Contrast  Result Date: 12/12/2017 CLINICAL DATA:  84 year old female with unwitnessed fall. Laceration posterior head. Initial encounter. EXAM: CT HEAD WITHOUT CONTRAST CT CERVICAL SPINE WITHOUT CONTRAST TECHNIQUE: Multidetector CT imaging of the head and cervical spine was performed following the standard protocol without intravenous contrast. Multiplanar CT image reconstructions of the cervical spine were also generated. COMPARISON:  05/16/2017 CT head and cervical spine. FINDINGS: CT HEAD FINDINGS Brain: No intracranial hemorrhage or CT evidence of large acute infarct. Chronic microvascular changes. Mild global atrophy. No intracranial mass lesion noted on this unenhanced exam. Vascular: Vascular calcifications. Skull: No skull fracture Sinuses/Orbits: No acute orbital abnormality. Mild exophthalmos. Minimal mucosal thickening ethmoid sinus air cells. Polypoid opacification right maxillary sinus. Mastoid air  cells and middle ear cavities are clear. Other: Right occipital small scalp laceration. Degenerative changes temporomandibular joint. CT CERVICAL SPINE FINDINGS Alignment: Mild rotation of the head and C1 ring upon C2. Skull base and vertebrae: No cervical spine fracture. Prior fusion C5-C7. Soft tissues and spinal canal: No abnormal prevertebral soft tissue swelling. Transverse ligament hypertrophy calcification. Disc levels: Cervical spondylotic changes with spinal stenosis and cord flattening C3-4 through C6-7. Upper chest: No worrisome lung apical lesion. Other: Carotid bifurcation calcifications. IMPRESSION: No skull fracture or intracranial hemorrhage. Laceration right occipital region. Chronic microvascular changes. Atrophy. Rotation of the head and C1 ring upon C2 may be related to head positioning. No cervical spine fracture. Prior fusion C5-C7. Cervical spondylotic changes with spinal stenosis and cord flattening C3-4 through C6-7. Electronically Signed   By: Lacy Duverney M.D.   On: 12/12/2017 19:44   Ct Cervical Spine Wo Contrast  Result Date: 12/12/2017 CLINICAL DATA:  83 year old female with unwitnessed fall. Laceration posterior head. Initial encounter. EXAM: CT HEAD WITHOUT CONTRAST CT CERVICAL SPINE WITHOUT CONTRAST TECHNIQUE: Multidetector CT imaging of the head and cervical spine was performed following the standard protocol without intravenous contrast. Multiplanar CT image reconstructions of the cervical spine were also generated. COMPARISON:  05/16/2017 CT head and cervical spine. FINDINGS: CT HEAD FINDINGS Brain: No intracranial hemorrhage or CT evidence of large acute infarct. Chronic microvascular changes. Mild global atrophy. No intracranial mass lesion noted on this unenhanced exam. Vascular: Vascular calcifications. Skull: No skull fracture Sinuses/Orbits: No acute orbital abnormality. Mild exophthalmos. Minimal mucosal thickening ethmoid sinus air cells. Polypoid opacification right  maxillary sinus. Mastoid air cells and middle ear cavities are clear. Other: Right occipital small scalp laceration. Degenerative changes temporomandibular joint. CT CERVICAL SPINE FINDINGS Alignment: Mild rotation of the head and C1 ring upon C2. Skull base and vertebrae: No cervical spine fracture. Prior fusion C5-C7. Soft tissues and spinal canal: No abnormal prevertebral soft tissue swelling. Transverse ligament hypertrophy calcification. Disc levels: Cervical spondylotic changes with spinal stenosis and cord flattening C3-4 through C6-7. Upper chest: No worrisome lung apical lesion. Other: Carotid bifurcation calcifications. IMPRESSION: No skull fracture or intracranial hemorrhage. Laceration right occipital region. Chronic microvascular changes. Atrophy. Rotation of the head and C1 ring upon C2 may be related to head positioning. No cervical spine fracture. Prior fusion C5-C7. Cervical spondylotic changes with spinal stenosis and cord flattening C3-4 through C6-7. Electronically Signed   By: Lacy Duverney M.D.   On: 12/12/2017 19:44    Assessment/Plan:    #1-history of fall gait abnormality--confusion--she has worked with therapy and appears to have gained strength-would benefit from continued therapy-  I do note an order for Tylenol arthritis-  As far as confusion this was thought possibly to be multifactoral possibly a TIA  and medication- baclofen was held for a while but has been restarted and appears to be tolerated  #2-hypertension-appears to have somewhat variable systolics but no consistent elevations she is on Norvasc and Lopressor- will defer to primary care provider.  3.  Coronary artery disease this appears relatively asymptomatic on aspirin Imdur Plavix and a statin.  4.  History of TIA likewise this appears stable on aspirin as well as Plavix  5 history of myoclonic jerking-this is been stable on Neurontin and baclofen- baclofen has been restarted since her stay here in the  facility    #6- history of insomnia she continues on trazodone.  7 history of hypothyroidism TSH has been within normal limits she is on Synthroid  #8 history of urinary frequency she continues on Myrbetriq  9 history of B12 deficiency B12 level has been within normal limits she is on supplementation  #10 history of hypokalemia this was supplemented and normalized will update this before discharge  11-rectal pain-this appears transitory at this time denies pain--all pressure really in  She  will need continued PT and OT as well as expedient primary care follow-up for her multiple medical issues.  [id number] note greater than 30 minutes spent on this discharge summary- greater than 50% of time spent coordinating a plan of care for numerous diagnoses

## 2017-12-24 ENCOUNTER — Encounter: Payer: Self-pay | Admitting: Internal Medicine

## 2017-12-24 ENCOUNTER — Encounter (HOSPITAL_COMMUNITY)
Admission: RE | Admit: 2017-12-24 | Discharge: 2017-12-24 | Disposition: A | Discharge status: Home / Self Care | Payer: Medicare Other | Attending: *Deleted | Admitting: *Deleted

## 2017-12-24 LAB — BASIC METABOLIC PANEL
Anion gap: 9 (ref 5–15)
BUN: 8 mg/dL (ref 8–23)
CALCIUM: 9.2 mg/dL (ref 8.9–10.3)
CHLORIDE: 102 mmol/L (ref 98–111)
CO2: 31 mmol/L (ref 22–32)
CREATININE: 0.54 mg/dL (ref 0.44–1.00)
GFR calc non Af Amer: >60 mL/min (ref >60)
Glucose, Bld: 96 mg/dL (ref 70–99)
Potassium: 3.5 mmol/L (ref 3.5–5.1)
SODIUM: 142 mmol/L (ref 135–145)

## 2017-12-26 DIAGNOSIS — R531 Weakness: Secondary | ICD-10-CM | POA: Diagnosis not present

## 2017-12-26 DIAGNOSIS — E039 Hypothyroidism, unspecified: Secondary | ICD-10-CM | POA: Diagnosis not present

## 2017-12-26 DIAGNOSIS — Z7982 Long term (current) use of aspirin: Secondary | ICD-10-CM | POA: Diagnosis not present

## 2017-12-26 DIAGNOSIS — Z8673 Personal history of transient ischemic attack (TIA), and cerebral infarction without residual deficits: Secondary | ICD-10-CM | POA: Diagnosis not present

## 2017-12-26 DIAGNOSIS — M1991 Primary osteoarthritis, unspecified site: Secondary | ICD-10-CM | POA: Diagnosis not present

## 2017-12-26 DIAGNOSIS — I2510 Atherosclerotic heart disease of native coronary artery without angina pectoris: Secondary | ICD-10-CM | POA: Diagnosis not present

## 2017-12-26 DIAGNOSIS — Z9181 History of falling: Secondary | ICD-10-CM | POA: Diagnosis not present

## 2017-12-26 DIAGNOSIS — G253 Myoclonus: Secondary | ICD-10-CM | POA: Diagnosis not present

## 2017-12-26 DIAGNOSIS — E785 Hyperlipidemia, unspecified: Secondary | ICD-10-CM | POA: Diagnosis not present

## 2017-12-26 DIAGNOSIS — I10 Essential (primary) hypertension: Secondary | ICD-10-CM | POA: Diagnosis not present

## 2017-12-30 DIAGNOSIS — M1991 Primary osteoarthritis, unspecified site: Secondary | ICD-10-CM | POA: Diagnosis not present

## 2017-12-30 DIAGNOSIS — E785 Hyperlipidemia, unspecified: Secondary | ICD-10-CM | POA: Diagnosis not present

## 2017-12-30 DIAGNOSIS — Z7982 Long term (current) use of aspirin: Secondary | ICD-10-CM | POA: Diagnosis not present

## 2017-12-30 DIAGNOSIS — G253 Myoclonus: Secondary | ICD-10-CM | POA: Diagnosis not present

## 2017-12-30 DIAGNOSIS — I10 Essential (primary) hypertension: Secondary | ICD-10-CM | POA: Diagnosis not present

## 2017-12-30 DIAGNOSIS — I2510 Atherosclerotic heart disease of native coronary artery without angina pectoris: Secondary | ICD-10-CM | POA: Diagnosis not present

## 2017-12-30 DIAGNOSIS — Z9181 History of falling: Secondary | ICD-10-CM | POA: Diagnosis not present

## 2017-12-30 DIAGNOSIS — Z8673 Personal history of transient ischemic attack (TIA), and cerebral infarction without residual deficits: Secondary | ICD-10-CM | POA: Diagnosis not present

## 2017-12-30 DIAGNOSIS — R531 Weakness: Secondary | ICD-10-CM | POA: Diagnosis not present

## 2017-12-30 DIAGNOSIS — E039 Hypothyroidism, unspecified: Secondary | ICD-10-CM | POA: Diagnosis not present

## 2018-01-02 DIAGNOSIS — Z09 Encounter for follow-up examination after completed treatment for conditions other than malignant neoplasm: Secondary | ICD-10-CM | POA: Diagnosis not present

## 2018-01-02 DIAGNOSIS — W19XXXA Unspecified fall, initial encounter: Secondary | ICD-10-CM | POA: Diagnosis not present

## 2018-01-02 DIAGNOSIS — R269 Unspecified abnormalities of gait and mobility: Secondary | ICD-10-CM | POA: Diagnosis not present

## 2018-01-06 DIAGNOSIS — Z7982 Long term (current) use of aspirin: Secondary | ICD-10-CM | POA: Diagnosis not present

## 2018-01-06 DIAGNOSIS — I2510 Atherosclerotic heart disease of native coronary artery without angina pectoris: Secondary | ICD-10-CM | POA: Diagnosis not present

## 2018-01-06 DIAGNOSIS — E785 Hyperlipidemia, unspecified: Secondary | ICD-10-CM | POA: Diagnosis not present

## 2018-01-06 DIAGNOSIS — I10 Essential (primary) hypertension: Secondary | ICD-10-CM | POA: Diagnosis not present

## 2018-01-06 DIAGNOSIS — G253 Myoclonus: Secondary | ICD-10-CM | POA: Diagnosis not present

## 2018-01-06 DIAGNOSIS — E039 Hypothyroidism, unspecified: Secondary | ICD-10-CM | POA: Diagnosis not present

## 2018-01-06 DIAGNOSIS — Z9181 History of falling: Secondary | ICD-10-CM | POA: Diagnosis not present

## 2018-01-06 DIAGNOSIS — Z8673 Personal history of transient ischemic attack (TIA), and cerebral infarction without residual deficits: Secondary | ICD-10-CM | POA: Diagnosis not present

## 2018-01-06 DIAGNOSIS — M1991 Primary osteoarthritis, unspecified site: Secondary | ICD-10-CM | POA: Diagnosis not present

## 2018-01-06 DIAGNOSIS — R531 Weakness: Secondary | ICD-10-CM | POA: Diagnosis not present

## 2018-01-11 DIAGNOSIS — E785 Hyperlipidemia, unspecified: Secondary | ICD-10-CM | POA: Diagnosis not present

## 2018-01-11 DIAGNOSIS — M1991 Primary osteoarthritis, unspecified site: Secondary | ICD-10-CM | POA: Diagnosis not present

## 2018-01-11 DIAGNOSIS — Z9181 History of falling: Secondary | ICD-10-CM | POA: Diagnosis not present

## 2018-01-11 DIAGNOSIS — E039 Hypothyroidism, unspecified: Secondary | ICD-10-CM | POA: Diagnosis not present

## 2018-01-11 DIAGNOSIS — G253 Myoclonus: Secondary | ICD-10-CM | POA: Diagnosis not present

## 2018-01-11 DIAGNOSIS — Z8673 Personal history of transient ischemic attack (TIA), and cerebral infarction without residual deficits: Secondary | ICD-10-CM | POA: Diagnosis not present

## 2018-01-11 DIAGNOSIS — I2510 Atherosclerotic heart disease of native coronary artery without angina pectoris: Secondary | ICD-10-CM | POA: Diagnosis not present

## 2018-01-11 DIAGNOSIS — R531 Weakness: Secondary | ICD-10-CM | POA: Diagnosis not present

## 2018-01-11 DIAGNOSIS — Z7982 Long term (current) use of aspirin: Secondary | ICD-10-CM | POA: Diagnosis not present

## 2018-01-11 DIAGNOSIS — I10 Essential (primary) hypertension: Secondary | ICD-10-CM | POA: Diagnosis not present

## 2018-01-25 ENCOUNTER — Other Ambulatory Visit: Payer: Self-pay | Admitting: *Deleted

## 2018-01-25 MED ORDER — GABAPENTIN 400 MG PO CAPS: 400.0000 mg | ORAL_CAPSULE | Freq: Four times a day (QID) | ORAL | 2 refills | Status: DC

## 2018-03-02 DIAGNOSIS — R531 Weakness: Secondary | ICD-10-CM | POA: Diagnosis not present

## 2018-03-02 DIAGNOSIS — G253 Myoclonus: Secondary | ICD-10-CM | POA: Diagnosis not present

## 2018-03-02 DIAGNOSIS — I2510 Atherosclerotic heart disease of native coronary artery without angina pectoris: Secondary | ICD-10-CM | POA: Diagnosis not present

## 2018-03-02 DIAGNOSIS — I10 Essential (primary) hypertension: Secondary | ICD-10-CM | POA: Diagnosis not present

## 2018-03-15 DIAGNOSIS — I2510 Atherosclerotic heart disease of native coronary artery without angina pectoris: Secondary | ICD-10-CM | POA: Diagnosis not present

## 2018-03-15 DIAGNOSIS — I10 Essential (primary) hypertension: Secondary | ICD-10-CM | POA: Diagnosis not present

## 2018-03-15 DIAGNOSIS — R531 Weakness: Secondary | ICD-10-CM | POA: Diagnosis not present

## 2018-03-15 DIAGNOSIS — G253 Myoclonus: Secondary | ICD-10-CM | POA: Diagnosis not present

## 2018-04-18 ENCOUNTER — Encounter: Payer: Self-pay | Admitting: Cardiovascular Disease

## 2018-05-08 ENCOUNTER — Encounter: Payer: Self-pay | Admitting: Cardiovascular Disease

## 2018-05-08 ENCOUNTER — Ambulatory Visit: Payer: Medicare Other | Admitting: Cardiovascular Disease

## 2018-05-08 VITALS — BP 106/50 | HR 69 | Ht 59.0 in | Wt 152.6 lb

## 2018-05-08 DIAGNOSIS — I10 Essential (primary) hypertension: Secondary | ICD-10-CM

## 2018-05-08 DIAGNOSIS — I2510 Atherosclerotic heart disease of native coronary artery without angina pectoris: Secondary | ICD-10-CM | POA: Diagnosis not present

## 2018-05-08 MED ORDER — NITROSTAT 0.4 MG SL SUBL: SUBLINGUAL_TABLET | SUBLINGUAL | 3 refills | Status: DC

## 2018-05-08 MED ORDER — AMLODIPINE BESYLATE 2.5 MG PO TABS: 2.5000 mg | ORAL_TABLET | Freq: Every day | ORAL | 3 refills | Status: DC

## 2018-05-08 MED ORDER — AMLODIPINE BESYLATE 2.5 MG PO TABS: 2.5000 mg | ORAL_TABLET | Freq: Every day | ORAL | 1 refills | Status: DC

## 2018-05-08 NOTE — Patient Instructions (Signed)
Medication Instructions:  Your physician has recommended you make the following change in your medication:   DECREASE Amlodipine to 2.5 mg daily (you may split your 5 mg tablets in half until you run out)  If you need a refill on your cardiac medications before your next appointment, please call your pharmacy.   Lab work: None Ordered   Testing/Procedures: None Ordered   Follow-Up: At [facility], you and your health needs are our priority.  As part of our continuing mission to provide you with exceptional heart care, we have created designated Provider Care Teams.  These Care Teams include your primary Cardiologist (physician) and Advanced Practice Providers (APPs -  Physician Assistants and Nurse Practitioners) who all work together to provide you with the care you need, when you need it. You will need a follow up appointment in:  1 months.  You may see Kristeen Miss, MD or one of the following Advanced Practice Providers on your designated Care Team: Tereso Newcomer, PA-C [place], [place] . Berton Bon, NP

## 2018-05-08 NOTE — Progress Notes (Signed)
Kimberly Bass Date of Birth  01/14/1926       [facility]    [place] [address]  [address] [place], [place]  [address]   [place], [place]  [address] [phone]     [phone]   Fax  [phone]    Fax [phone]  Problem List: 1. Coronary artery disease-is a tight stenosis in the distal aspect of her circumflex artery 2. Hyperthyroidism 3. Hyperlipidemia 4. Hypertension    Kimberly Bass is an 84 y.o.  with a hx of CAD. She had a heart catheterization on January 03, 2006. She has a very tight stenosis in the terminal aspect of a large posterior lateral branch. This stenosis is very tortuous and is diffusely diseased and is not a candidate for PCI.  She's not had any episodes of chest pain or shortness breath. She's been able to do all of her normal activities without any limitations. She used  to have a good bit of angina but has not noticed any in the past several months.  December 27, 2012:  Kimberly Bass is doing OK.  She is getting weaker - came in with a walker today.  She is able to walk shorter distances on her own.  She has leg weakness walking long distances.    She has not had to use her NTG much - on occasion.     01/10/2014: Pt is doing ok.  Still using the walker for long distance walking.  Able to get around the house without.  No CP.   BP is OK  January 20, 2015:  Doing well. No CP .   Aug. 16, 2017:  Doing very well. Using a rolling walker. Does not use the walker at home  No Cp or dyspnea.   Nov. 8, 2018:  Seen with Baker Pierini, son Is a little jumpy today  Was in the hospital last week with UTI. No Cp or dyspnea.  Tolerating her meds well Walks with a walker   May 08, 2018:  Kimberly Bass is seen today for follow-up visit.  She has known coronary artery disease.  She has a tight stenosis in her left circumflex artery.  She is been basically asymptomatic for years. She has a history of  hypertension and hyperlipidemia.  No angina . Lives at the [facility]  No syncope or presyncope   Current Outpatient Medications on File Prior to Visit  Medication Sig Dispense Refill  . acetaminophen (TYLENOL ARTHRITIS PAIN) 650 MG CR tablet Take 650 mg by mouth 2 (two) times daily.     Marland Kitchen amLODipine (NORVASC) 5 MG tablet Take 5 mg by mouth daily.     Marland Kitchen aspirin 81 MG tablet Take 81 mg by mouth daily.      . baclofen (LIORESAL) 20 MG tablet Take 1 tablet by mouth daily.    . Calcium Carbonate-Vitamin D (CALCIUM + D PO) Take 1 tablet by mouth 2 (two) times daily.     . clopidogrel (PLAVIX) 75 MG tablet TAKE 1 TABLET BY MOUTH  DAILY 90 tablet 3  . gabapentin (NEURONTIN) 400 MG capsule Take 1 capsule (400 mg total) by mouth 4 (four) times daily. 360 capsule 2  . isosorbide mononitrate (IMDUR) 60 MG 24 hr tablet TAKE 1 TABLET BY MOUTH  DAILY 90 tablet 3  . levothyroxine (SYNTHROID, LEVOTHROID) 75 MCG tablet Take 75 mcg by mouth daily.      . metoprolol tartrate (  LOPRESSOR) 100 MG tablet TAKE 1 TABLET BY MOUTH TWO  TIMES DAILY 180 tablet 3  . mirabegron ER (MYRBETRIQ) 50 MG TB24 tablet Take 50 mg by mouth daily.     . Multiple Vitamin (MULTIVITAMIN) tablet Take 1 tablet by mouth daily.      Marland Kitchen NITROSTAT 0.4 MG SL tablet DISSOLVE ONE TABLET UNDER THE TONGUE EVERY 5 MINUTES AS NEEDED FOR CHEST PAIN.  DO NOT EXCEED A TOTAL OF 3 DOSES IN 15 MINUTES 25 tablet 1  . ranitidine (ZANTAC) 150 MG tablet Take 150 mg by mouth 2 (two) times daily.      . simvastatin (ZOCOR) 20 MG tablet Take 20 mg by mouth every evening.     . traZODone (DESYREL) 50 MG tablet Take 75 mg by mouth at bedtime.     . vitamin B-12 (CYANOCOBALAMIN) 1000 MCG tablet Take 1,000 mcg by mouth daily.     No current facility-administered medications on file prior to visit.     Allergies  Allergen Reactions  . Codeine Nausea Only  . Meperidine Hcl Nausea Only  . Morphine Nausea Only  . Penicillins Nausea Only    Has patient had a PCN  reaction causing immediate rash, facial/tongue/throat swelling, SOB or lightheadedness with hypotension: Unk Has patient had a PCN reaction causing severe rash involving mucus membranes or skin necrosis: Unk Has patient had a PCN reaction that required hospitalization: Unk Has patient had a PCN reaction occurring within the last 10 years: Unk If all of the above answers are "NO", then may proceed with Cephalosporin use.     Past Medical History:  Diagnosis Date  . Arthritis    "mild; in all my joints" (04/27/2017)  . Benign essential tremor   . Coronary artery disease   . Cystitis with hematuria 04/26/2017  . Gait disorder   . H/O radioactive iodine thyroid ablation   . Hx of transient ischemic attack (TIA)   . Hyperlipidemia   . Hypertension    "not since MI in 2007" (04/27/2017)  . Hyperthyroidism    H/O radioactive iodine thyroid ablation [Z92.3]  . Hypothyroidism   . Lower extremity myoclonus   . Lumbosacral spondylosis   . Migraine    "I've had one in my lifetime" (04/27/2017)  . Mild obesity   . Myocardial infarction (HCC) 12/2005  . Myoclonus    Lower extremity involvement  . Obesity   . Pneumonia    "lots when I was a small child; not at all since" (04/27/2017)  . Seizures (HCC)    History is questionable  . Thyroid disease     Past Surgical History:  Procedure Laterality Date  . ABDOMINAL HYSTERECTOMY  1972   for uterine fibroids/notes 11/10/2010  . ANTERIOR CERVICAL DECOMP/DISCECTOMY FUSION  03/2004   Kimberly Bass 11/10/2010  . APPENDECTOMY    . BACK SURGERY    . BREAST BIOPSY Right 1996   nodule resected/notes 11/10/2010  . CARDIAC CATHETERIZATION  01/03/2006  . CATARACT EXTRACTION W/ INTRAOCULAR LENS  IMPLANT, BILATERAL Bilateral   . COLONOSCOPY  11/2003   Kimberly Bass 11/10/2010  . ESOPHAGOGASTRODUODENOSCOPY  09/2006   Kimberly Bass 11/10/2010  . LUMBAR DISC SURGERY  1990s X 1; 05/2003   "a nerve was pinching"; Kimberly Bass 11/10/2010  . OVARY SURGERY Left 1999   benign tumor  resected /notes 11/10/2010  . TONSILLECTOMY  ~ 41    Social History   Tobacco Use  Smoking Status Never Smoker  Smokeless Tobacco Never Used    Social History  Substance and Sexual Activity  Alcohol Use No    Family History  Problem Relation Age of Onset  . Heart disease Father   . Heart attack Mother   . Stroke Sister   . Heart attack Sister   . Heart attack Brother        Possible  . Diabetes Sister     Reviw of Systems:  Reviewed in the HPI.  All other systems are negative.  Physical Exam: Blood pressure (!) 106/50, pulse 69, height 4\' 11"  (1.499 m), weight 152 lb 9.6 oz (69.2 kg), SpO2 92 %.  GEN:   Elderly female,  NAD  HEENT: Normal NECK: No JVD; No carotid bruits LYMPHATICS: No lymphadenopathy CARDIAC: RRR   RESPIRATORY:  Clear to auscultation without rales, wheezing or rhonchi  ABDOMEN: Soft, non-tender, non-distended MUSCULOSKELETAL:  No edema; No deformity  SKIN: Warm and dry NEUROLOGIC:  Alert and oriented x 3    ECG:  Assessment / Plan:   1. Coronary artery disease-     No angina , Cont. Conservative management   2. Hyperthyroidism  3. Hyperlipidemia -   Stable,   Managed by Dr. [name].   4. Hypertension -blood pressure is actually on the low side. Will reduce amlodipine to 2.5 a day   Kristeen Miss, MD  05/08/2018 10:59 AM    [facility] [address] [place],  [address] [place], [place]  [address] Pager [phone] Phone: [phone]; Fax: [phone]

## 2018-05-30 ENCOUNTER — Other Ambulatory Visit: Payer: Self-pay | Admitting: Cardiovascular Disease

## 2018-06-07 ENCOUNTER — Ambulatory Visit: Payer: Medicare Other | Admitting: Cardiology

## 2018-06-07 ENCOUNTER — Encounter: Payer: Self-pay | Admitting: Cardiology

## 2018-06-07 VITALS — BP 132/78 | HR 61 | Ht 59.0 in | Wt 150.4 lb

## 2018-06-07 DIAGNOSIS — I952 Hypotension due to drugs: Secondary | ICD-10-CM | POA: Diagnosis not present

## 2018-06-07 DIAGNOSIS — I2510 Atherosclerotic heart disease of native coronary artery without angina pectoris: Secondary | ICD-10-CM | POA: Diagnosis not present

## 2018-06-07 DIAGNOSIS — I10 Essential (primary) hypertension: Secondary | ICD-10-CM

## 2018-06-07 MED ORDER — AMLODIPINE BESYLATE 2.5 MG PO TABS: 2.5000 mg | ORAL_TABLET | Freq: Every day | ORAL | 3 refills | Status: DC

## 2018-06-07 NOTE — Progress Notes (Signed)
Cardiology Office Note   Date:  06/07/2018   ID:  Kimberly Bass, DOB Jan 07, 1926, MRN [id number]  PCP:  Merri Brunette, MD  Cardiologist: Dr. Elease Hashimoto   Chief Complaint  Patient presents with  . Hypotension  . Follow-up    History of Present Illness: Kimberly Bass is a 84 y.o. female who presents for BP follow up, seen for Dr. Elease Hashimoto.   Kimberly Bass has a prior history of CAD, HTN and HLD. She underwent a cath in 12/2005 which showed a very tight stenosis in the terminal aspect of a large posterior lateral branch. The stenosis was tortuous and was noted to be diffusely diseased, not amenable for PCI.   She was last seen by Dr. Elease Hashimoto on 05/08/2018 and was doing well without symptoms of angina, palpitations, SOB, dizziness or syncope. She was able to perform all of her normal activities without limitations. She used to experience remote angina but had not noticed in several years. Her BP was noted to be relatively low at 106/50, so her amlodipine was decreased to 2.5 mg per day with plans to have close follow up for re-assessment   Today she presents and is doing well. She is again without anginal symptoms. Her BP is improved from last visit, now to 132/78. She denies dizziness, presyncopal or syncopal episodes. She is here with her son. In reviewing her medication list, she states that she has not been taking the Imdur for at least one year. We will go ahead and stop this as she has not had recurrent anginal symptoms in many years.    Past Medical History:  Diagnosis Date  . Arthritis    "mild; in all my joints" (04/27/2017)  . Benign essential tremor   . Coronary artery disease   . Cystitis with hematuria 04/26/2017  . Gait disorder   . H/O radioactive iodine thyroid ablation   . Hx of transient ischemic attack (TIA)   . Hyperlipidemia   . Hypertension    "not since MI in 2007" (04/27/2017)  . Hyperthyroidism    H/O radioactive iodine thyroid ablation [Z92.3]  .  Hypothyroidism   . Lower extremity myoclonus   . Lumbosacral spondylosis   . Migraine    "I've had one in my lifetime" (04/27/2017)  . Mild obesity   . Myocardial infarction (HCC) 12/2005  . Myoclonus    Lower extremity involvement  . Obesity   . Pneumonia    "lots when I was a small child; not at all since" (04/27/2017)  . Seizures (HCC)    History is questionable  . Thyroid disease     Past Surgical History:  Procedure Laterality Date  . ABDOMINAL HYSTERECTOMY  1972   for uterine fibroids/notes 11/10/2010  . ANTERIOR CERVICAL DECOMP/DISCECTOMY FUSION  03/2004   [name] 11/10/2010  . APPENDECTOMY    . BACK SURGERY    . BREAST BIOPSY Right 1996   nodule resected/notes 11/10/2010  . CARDIAC CATHETERIZATION  01/03/2006  . CATARACT EXTRACTION W/ INTRAOCULAR LENS  IMPLANT, BILATERAL Bilateral   . COLONOSCOPY  11/2003   [name] 11/10/2010  . ESOPHAGOGASTRODUODENOSCOPY  09/2006   [name] 11/10/2010  . LUMBAR DISC SURGERY  1990s X 1; 05/2003   "a nerve was pinching"; [name] 11/10/2010  . OVARY SURGERY Left 1999   benign tumor resected /notes 11/10/2010  . TONSILLECTOMY  ~ 1947     Current Outpatient Medications  Medication Sig Dispense Refill  . acetaminophen (TYLENOL ARTHRITIS PAIN) 650 MG CR tablet  Take 650 mg by mouth 2 (two) times daily.     Marland Kitchen amLODipine (NORVASC) 2.5 MG tablet Take 1 tablet (2.5 mg total) by mouth daily. 90 tablet 3  . aspirin 81 MG tablet Take 81 mg by mouth daily.      . baclofen (LIORESAL) 20 MG tablet Take 1 tablet by mouth daily.    . Calcium Carbonate-Vitamin D (CALCIUM + D PO) Take 1 tablet by mouth 2 (two) times daily.     . clopidogrel (PLAVIX) 75 MG tablet TAKE 1 TABLET BY MOUTH  DAILY 90 tablet 3  . gabapentin (NEURONTIN) 400 MG capsule Take 1 capsule (400 mg total) by mouth 4 (four) times daily. 360 capsule 2  . levothyroxine (SYNTHROID, LEVOTHROID) 75 MCG tablet Take 75 mcg by mouth daily.      . metoprolol tartrate (LOPRESSOR) 100 MG tablet TAKE 1  TABLET BY MOUTH TWO  TIMES DAILY 180 tablet 3  . mirabegron ER (MYRBETRIQ) 50 MG TB24 tablet Take 50 mg by mouth daily.     . Multiple Vitamin (MULTIVITAMIN) tablet Take 1 tablet by mouth daily.      Marland Kitchen NITROSTAT 0.4 MG SL tablet DISSOLVE ONE TABLET UNDER THE TONGUE EVERY 5 MINUTES AS NEEDED FOR CHEST PAIN.  DO NOT EXCEED A TOTAL OF 3 DOSES IN 15 MINUTES 25 tablet 3  . ranitidine (ZANTAC) 150 MG tablet Take 150 mg by mouth 2 (two) times daily.      . simvastatin (ZOCOR) 20 MG tablet Take 20 mg by mouth every evening.     . traZODone (DESYREL) 50 MG tablet Take 75 mg by mouth at bedtime.     . vitamin B-12 (CYANOCOBALAMIN) 1000 MCG tablet Take 1,000 mcg by mouth daily.     No current facility-administered medications for this visit.     Allergies:   Codeine; Meperidine hcl; Morphine; and Penicillins   Social History:  The patient  reports that she has never smoked. She has never used smokeless tobacco. She reports that she does not drink alcohol or use drugs.   Family History:  The patient's family history includes Diabetes in her sister; Heart attack in her brother, mother, and sister; Heart disease in her father; Stroke in her sister.   ROS:  Please see the history of present illness. Otherwise, review of systems are positive for none. All other systems are reviewed and negative.    PHYSICAL EXAM: VS:  BP 132/78   Pulse 61   Ht 4\' 11"  (1.499 m)   Wt 150 lb 6.4 oz (68.2 kg)   SpO2 94%   BMI 30.38 kg/m  , BMI Body mass index is 30.38 kg/m.  General: Elderly, NAD Skin: Warm, dry, intact  Neck: Negative for carotid bruits. No JVD Lungs: Clear to ausculation bilaterally. No wheezes, rales, or rhonchi. Breathing is unlabored. Cardiovascular: RRR with S1 S2. No murmurs, rubs, gallops, or LV heave appreciated. MSK: Strength and tone appear normal for age. 5/5 in all extremities Extremities: No edema. No clubbing or cyanosis. DP/PT pulses 2+ bilaterally Neuro: Alert and oriented. No  focal deficits. No facial asymmetry. MAE spontaneously. Psych: Responds to questions appropriately with normal affect.     EKG:  EKG is ordered today. The ekg ordered today demonstrates NSR with first degree AV block, HR 61  Recent Labs: 12/12/2017: ALT 19 12/13/2017: TSH 2.114 12/14/2017: Hemoglobin 14.7; Magnesium 1.9; Platelets 149 12/24/2017: BUN 8; Creatinine, Ser 0.54; Potassium 3.5; Sodium 142    Lipid Panel No results  found for: CHOL, TRIG, HDL, CHOLHDL, VLDL, LDLCALC, LDLDIRECT    Wt Readings from Last 3 Encounters:  06/07/18 150 lb 6.4 oz (68.2 kg)  05/08/18 152 lb 9.6 oz (69.2 kg)  11/02/17 154 lb 3.2 oz (69.9 kg)     Other studies Reviewed: Additional studies/ records that were reviewed today include: None    ASSESSMENT AND PLAN:  1. Hx of CAD with known LCX stenosis from 2007: -No anginal symptoms -Plan to continue conservative management with ASA, statin, metoprolol -Imdur stopped in the setting that the patient states that she has not taken this in approximately one year with no recurrent symptoms   2. HLD: -Stable, managed by PCP, Dr. Renne CriglerPharr -Continue simvastatin  3. HTN: -Last BP found to be low at 106/50>>pt presents today for BP follow up with repeat of 132/78 today  -Amlodipine was reduced to 2.5mg  daily with close follow up  -No dizziness or falls  -Continue metoprolol 100mg  twice daily    Current medicines are reviewed at length with the patient today.  The patient does not have concerns regarding medicines.  The following changes have been made: Keep Amlodipine 2.5mg  daily and stop Imdur 60mg  daily   Labs/ tests ordered today include: None   Orders Placed This Encounter  Procedures  . EKG 12-Lead    Disposition:   FU with Dr. Elease HashimotoNahser in 1 year  Signed, Georgie ChardJill Anselm Aumiller, NP  06/07/2018 3:48 PM    [facility] [address], [place], [place]  [address] Phone: [phone]([phone]; Fax: [phone]

## 2018-06-07 NOTE — Patient Instructions (Signed)
Medication Instructions:  Your physician has recommended you make the following change in your medication:  1. STOP IMDUR 60 MG DAILY.   If you need a refill on your cardiac medications before your next appointment, please call your pharmacy.   Lab work: None Ordered  If you have labs (blood work) drawn today and your tests are completely normal, you will receive your results only by: Marland Kitchen. MyChart Message (if you have MyChart) OR . A paper copy in the mail If you have any lab test that is abnormal or we need to change your treatment, we will call you to review the results.  Testing/Procedures: None ordered  Follow-Up: At [facility], you and your health needs are our priority.  As part of our continuing mission to provide you with exceptional heart care, we have created designated Provider Care Teams.  These Care Teams include your primary Cardiologist (physician) and Advanced Practice Providers (APPs -  Physician Assistants and Nurse Practitioners) who all work together to provide you with the care you need, when you need it. . You will need a follow up appointment in 1 year.  Please call our office 2 months in advance to schedule this appointment.  You may see DR. NAHSER or one of the following Advanced Practice Providers on your designated Care Team.  Any Other Special Instructions Will Be Listed Below (If Applicable).

## 2018-06-29 ENCOUNTER — Other Ambulatory Visit: Payer: Self-pay | Admitting: Cardiovascular Disease

## 2018-07-04 ENCOUNTER — Other Ambulatory Visit: Payer: Self-pay | Admitting: Cardiovascular Disease

## 2018-07-10 ENCOUNTER — Other Ambulatory Visit: Payer: Self-pay | Admitting: Neurology

## 2018-07-19 DIAGNOSIS — I10 Essential (primary) hypertension: Secondary | ICD-10-CM | POA: Diagnosis not present

## 2018-07-19 DIAGNOSIS — Z880 Allergy status to penicillin: Secondary | ICD-10-CM | POA: Diagnosis not present

## 2018-07-19 DIAGNOSIS — N390 Urinary tract infection, site not specified: Secondary | ICD-10-CM | POA: Diagnosis not present

## 2018-07-19 DIAGNOSIS — Z7982 Long term (current) use of aspirin: Secondary | ICD-10-CM | POA: Diagnosis not present

## 2018-07-19 DIAGNOSIS — E039 Hypothyroidism, unspecified: Secondary | ICD-10-CM | POA: Diagnosis not present

## 2018-07-24 DIAGNOSIS — H6122 Impacted cerumen, left ear: Secondary | ICD-10-CM | POA: Diagnosis not present

## 2018-07-24 DIAGNOSIS — N3000 Acute cystitis without hematuria: Secondary | ICD-10-CM | POA: Diagnosis not present

## 2018-07-24 DIAGNOSIS — Z0000 Encounter for general adult medical examination without abnormal findings: Secondary | ICD-10-CM | POA: Diagnosis not present

## 2018-07-24 DIAGNOSIS — E039 Hypothyroidism, unspecified: Secondary | ICD-10-CM | POA: Diagnosis not present

## 2018-07-24 DIAGNOSIS — M8580 Other specified disorders of bone density and structure, unspecified site: Secondary | ICD-10-CM | POA: Diagnosis not present

## 2018-07-24 DIAGNOSIS — I252 Old myocardial infarction: Secondary | ICD-10-CM | POA: Diagnosis not present

## 2018-07-26 ENCOUNTER — Telehealth: Payer: Self-pay | Admitting: Neurology

## 2018-07-26 NOTE — Telephone Encounter (Signed)
The patient had blood work done through her primary care physician on 24 July 2018.  White blood count was 6.5, hemoglobin 14.2, hematocrit 42.8, platelets of 165, MCV of 87.7.  Chemistry profile revealed a glucose of 102, albumin 4.3, liver profile is unremarkable, BUN of 23, calcium 10.0, CO2 29, chloride of 100, creatinine of 0.85, total protein level 6.6, sodium 145, potassium 3.9.  TSH of 2.07.

## 2018-08-01 ENCOUNTER — Observation Stay (HOSPITAL_COMMUNITY)
Admission: EM | Admit: 2018-08-01 | Discharge: 2018-08-03 | Disposition: A | Discharge status: Home / Self Care | Payer: Medicare Other | Attending: Internal Medicine | Admitting: Internal Medicine

## 2018-08-01 ENCOUNTER — Other Ambulatory Visit: Payer: Self-pay

## 2018-08-01 ENCOUNTER — Encounter (HOSPITAL_COMMUNITY): Payer: Self-pay

## 2018-08-01 DIAGNOSIS — Z6830 Body mass index (BMI) 30.0-30.9, adult: Secondary | ICD-10-CM | POA: Insufficient documentation

## 2018-08-01 DIAGNOSIS — E0590 Thyrotoxicosis, unspecified without thyrotoxic crisis or storm: Secondary | ICD-10-CM | POA: Insufficient documentation

## 2018-08-01 DIAGNOSIS — R8281 Pyuria: Secondary | ICD-10-CM

## 2018-08-01 DIAGNOSIS — R4182 Altered mental status, unspecified: Secondary | ICD-10-CM | POA: Diagnosis present

## 2018-08-01 DIAGNOSIS — I2510 Atherosclerotic heart disease of native coronary artery without angina pectoris: Secondary | ICD-10-CM | POA: Diagnosis not present

## 2018-08-01 DIAGNOSIS — Z7902 Long term (current) use of antithrombotics/antiplatelets: Secondary | ICD-10-CM | POA: Diagnosis not present

## 2018-08-01 DIAGNOSIS — R269 Unspecified abnormalities of gait and mobility: Secondary | ICD-10-CM | POA: Diagnosis not present

## 2018-08-01 DIAGNOSIS — M5136 Other intervertebral disc degeneration, lumbar region: Secondary | ICD-10-CM | POA: Insufficient documentation

## 2018-08-01 DIAGNOSIS — N390 Urinary tract infection, site not specified: Secondary | ICD-10-CM

## 2018-08-01 DIAGNOSIS — E039 Hypothyroidism, unspecified: Secondary | ICD-10-CM | POA: Diagnosis not present

## 2018-08-01 DIAGNOSIS — E785 Hyperlipidemia, unspecified: Secondary | ICD-10-CM | POA: Insufficient documentation

## 2018-08-01 DIAGNOSIS — I252 Old myocardial infarction: Secondary | ICD-10-CM | POA: Insufficient documentation

## 2018-08-01 DIAGNOSIS — Z79899 Other long term (current) drug therapy: Secondary | ICD-10-CM | POA: Diagnosis not present

## 2018-08-01 DIAGNOSIS — Z79890 Hormone replacement therapy (postmenopausal): Secondary | ICD-10-CM | POA: Diagnosis not present

## 2018-08-01 DIAGNOSIS — Z87440 Personal history of urinary (tract) infections: Secondary | ICD-10-CM | POA: Insufficient documentation

## 2018-08-01 DIAGNOSIS — E876 Hypokalemia: Secondary | ICD-10-CM | POA: Diagnosis not present

## 2018-08-01 DIAGNOSIS — I10 Essential (primary) hypertension: Secondary | ICD-10-CM

## 2018-08-01 DIAGNOSIS — Z7982 Long term (current) use of aspirin: Secondary | ICD-10-CM | POA: Insufficient documentation

## 2018-08-01 DIAGNOSIS — Z8673 Personal history of transient ischemic attack (TIA), and cerebral infarction without residual deficits: Secondary | ICD-10-CM | POA: Diagnosis not present

## 2018-08-01 DIAGNOSIS — R319 Hematuria, unspecified: Secondary | ICD-10-CM | POA: Insufficient documentation

## 2018-08-01 DIAGNOSIS — G9341 Metabolic encephalopathy: Secondary | ICD-10-CM | POA: Diagnosis not present

## 2018-08-01 DIAGNOSIS — E669 Obesity, unspecified: Secondary | ICD-10-CM | POA: Insufficient documentation

## 2018-08-01 DIAGNOSIS — I491 Atrial premature depolarization: Secondary | ICD-10-CM | POA: Insufficient documentation

## 2018-08-01 DIAGNOSIS — G250 Essential tremor: Secondary | ICD-10-CM | POA: Insufficient documentation

## 2018-08-01 DIAGNOSIS — Z66 Do not resuscitate: Secondary | ICD-10-CM | POA: Insufficient documentation

## 2018-08-01 DIAGNOSIS — R410 Disorientation, unspecified: Secondary | ICD-10-CM

## 2018-08-01 DIAGNOSIS — Z8249 Family history of ischemic heart disease and other diseases of the circulatory system: Secondary | ICD-10-CM | POA: Diagnosis not present

## 2018-08-01 LAB — CBC WITH DIFFERENTIAL/PLATELET
Abs Immature Granulocytes: 0.02 10*3/uL (ref 0.00–0.07)
Basophils Absolute: 0 10*3/uL (ref 0.0–0.1)
Basophils Relative: 0 %
Eosinophils Absolute: 0.2 10*3/uL (ref 0.0–0.5)
Eosinophils Relative: 2 %
HCT: 46.7 % — ABNORMAL HIGH (ref 36.0–46.0)
Hemoglobin: 15.3 g/dL — ABNORMAL HIGH (ref 12.0–15.0)
Immature Granulocytes: 0 %
Lymphocytes Relative: 23 %
Lymphs Abs: 1.5 10*3/uL (ref 0.7–4.0)
MCH: 29.4 pg (ref 26.0–34.0)
MCHC: 32.8 g/dL (ref 30.0–36.0)
MCV: 89.6 fL (ref 80.0–100.0)
Monocytes Absolute: 0.5 10*3/uL (ref 0.1–1.0)
Monocytes Relative: 7 %
Neutro Abs: 4.6 10*3/uL (ref 1.7–7.7)
Neutrophils Relative %: 68 %
Platelets: 166 10*3/uL (ref 150–400)
RBC: 5.21 MIL/uL — ABNORMAL HIGH (ref 3.87–5.11)
RDW: 14 % (ref 11.5–15.5)
WBC: 6.7 10*3/uL (ref 4.0–10.5)
nRBC: 0 % (ref 0.0–0.2)

## 2018-08-01 LAB — COMPREHENSIVE METABOLIC PANEL
ALT: 26 U/L (ref 0–44)
AST: 34 U/L (ref 15–41)
Albumin: 3.3 g/dL — ABNORMAL LOW (ref 3.5–5.0)
Alkaline Phosphatase: 35 U/L — ABNORMAL LOW (ref 38–126)
Anion gap: 10 (ref 5–15)
BUN: 18 mg/dL (ref 8–23)
CO2: 21 mmol/L — ABNORMAL LOW (ref 22–32)
Calcium: 7.7 mg/dL — ABNORMAL LOW (ref 8.9–10.3)
Chloride: 113 mmol/L — ABNORMAL HIGH (ref 98–111)
Creatinine, Ser: 0.53 mg/dL (ref 0.44–1.00)
GFR calc Af Amer: >60 mL/min (ref >60)
GFR calc non Af Amer: >60 mL/min (ref >60)
GLUCOSE: 82 mg/dL (ref 70–99)
Potassium: 2.6 mmol/L — CL (ref 3.5–5.1)
Sodium: 144 mmol/L (ref 135–145)
Total Bilirubin: 0.8 mg/dL (ref 0.3–1.2)
Total Protein: 5.8 g/dL — ABNORMAL LOW (ref 6.5–8.1)

## 2018-08-01 LAB — URINALYSIS, ROUTINE W REFLEX MICROSCOPIC
Bacteria, UA: NONE SEEN
Bilirubin Urine: NEGATIVE
Glucose, UA: NEGATIVE mg/dL
Hgb urine dipstick: NEGATIVE
Ketones, ur: NEGATIVE mg/dL
Nitrite: NEGATIVE
PH: 8 (ref 5.0–8.0)
Protein, ur: NEGATIVE mg/dL
Specific Gravity, Urine: 1.013 (ref 1.005–1.030)

## 2018-08-01 LAB — MAGNESIUM: Magnesium: 1.6 mg/dL — ABNORMAL LOW (ref 1.7–2.4)

## 2018-08-01 MED ORDER — CALCIUM CARBONATE-VITAMIN D 500-200 MG-UNIT PO TABS
2.0000 | ORAL_TABLET | Freq: Two times a day (BID) | ORAL | Status: DC
  Administered 2018-08-01 – 2018-08-03 (×4): 2 via ORAL
  Filled 2018-08-01 (×4): qty 2

## 2018-08-01 MED ORDER — AMLODIPINE BESYLATE 5 MG PO TABS
2.5000 mg | ORAL_TABLET | Freq: Every day | ORAL | Status: DC
  Administered 2018-08-01 – 2018-08-02 (×2): 2.5 mg via ORAL
  Filled 2018-08-01 (×2): qty 1

## 2018-08-01 MED ORDER — GABAPENTIN 400 MG PO CAPS
800.0000 mg | ORAL_CAPSULE | Freq: Every day | ORAL | Status: DC
  Administered 2018-08-02 – 2018-08-03 (×2): 800 mg via ORAL
  Filled 2018-08-01 (×3): qty 2

## 2018-08-01 MED ORDER — BACLOFEN 20 MG PO TABS
20.0000 mg | ORAL_TABLET | Freq: Every day | ORAL | Status: DC
  Administered 2018-08-02 – 2018-08-03 (×2): 20 mg via ORAL
  Filled 2018-08-01 (×2): qty 1

## 2018-08-01 MED ORDER — VITAMIN B-12 1000 MCG PO TABS
1000.0000 ug | ORAL_TABLET | Freq: Every day | ORAL | Status: DC
  Administered 2018-08-01 – 2018-08-03 (×3): 1000 ug via ORAL
  Filled 2018-08-01 (×3): qty 1

## 2018-08-01 MED ORDER — NITROGLYCERIN 0.4 MG SL SUBL: 0.4000 mg | SUBLINGUAL_TABLET | SUBLINGUAL | Status: DC | PRN

## 2018-08-01 MED ORDER — ADULT MULTIVITAMIN W/MINERALS CH
1.0000 | ORAL_TABLET | Freq: Every day | ORAL | Status: DC
  Administered 2018-08-02 – 2018-08-03 (×2): 1 via ORAL
  Filled 2018-08-01 (×2): qty 1

## 2018-08-01 MED ORDER — MIRABEGRON ER 25 MG PO TB24
25.0000 mg | ORAL_TABLET | Freq: Every day | ORAL | Status: DC
  Administered 2018-08-02 – 2018-08-03 (×2): 25 mg via ORAL
  Filled 2018-08-01 (×2): qty 1

## 2018-08-01 MED ORDER — ISOSORBIDE MONONITRATE ER 60 MG PO TB24
60.0000 mg | ORAL_TABLET | Freq: Every day | ORAL | Status: DC
  Administered 2018-08-02: 60 mg via ORAL
  Filled 2018-08-01 (×2): qty 1

## 2018-08-01 MED ORDER — BACLOFEN 20 MG PO TABS
30.0000 mg | ORAL_TABLET | Freq: Two times a day (BID) | ORAL | Status: DC
  Administered 2018-08-01 – 2018-08-03 (×4): 30 mg via ORAL
  Filled 2018-08-01 (×4): qty 1

## 2018-08-01 MED ORDER — TRAZODONE HCL 50 MG PO TABS
75.0000 mg | ORAL_TABLET | Freq: Every day | ORAL | Status: DC
  Administered 2018-08-01 – 2018-08-02 (×2): 75 mg via ORAL
  Filled 2018-08-01 (×2): qty 2

## 2018-08-01 MED ORDER — ONDANSETRON HCL 4 MG/2ML IJ SOLN: 4.0000 mg | Freq: Four times a day (QID) | INTRAMUSCULAR | Status: DC | PRN

## 2018-08-01 MED ORDER — MAGNESIUM SULFATE 2 GM/50ML IV SOLN
2.0000 g | Freq: Once | INTRAVENOUS | Status: AC
  Administered 2018-08-01: 2 g via INTRAVENOUS
  Filled 2018-08-01: qty 50

## 2018-08-01 MED ORDER — ACETAMINOPHEN 650 MG RE SUPP: 650.0000 mg | Freq: Four times a day (QID) | RECTAL | Status: DC | PRN

## 2018-08-01 MED ORDER — POTASSIUM CHLORIDE 10 MEQ/100ML IV SOLN
10.0000 meq | INTRAVENOUS | Status: AC
  Administered 2018-08-01: 10 meq via INTRAVENOUS
  Filled 2018-08-01 (×2): qty 100

## 2018-08-01 MED ORDER — TRIAMTERENE-HCTZ 37.5-25 MG PO CAPS
1.0000 | ORAL_CAPSULE | Freq: Every day | ORAL | Status: DC
  Administered 2018-08-02: 1 via ORAL
  Filled 2018-08-01 (×2): qty 1

## 2018-08-01 MED ORDER — SODIUM CHLORIDE 0.9 % IV SOLN
1.0000 g | INTRAVENOUS | Status: DC
  Administered 2018-08-02 – 2018-08-03 (×2): 1 g via INTRAVENOUS
  Filled 2018-08-01 (×2): qty 1

## 2018-08-01 MED ORDER — ENOXAPARIN SODIUM 40 MG/0.4ML ~~LOC~~ SOLN
40.0000 mg | SUBCUTANEOUS | Status: DC
  Administered 2018-08-01 – 2018-08-02 (×2): 40 mg via SUBCUTANEOUS
  Filled 2018-08-01 (×2): qty 0.4

## 2018-08-01 MED ORDER — SODIUM CHLORIDE 0.9 % IV SOLN
1.0000 g | Freq: Once | INTRAVENOUS | Status: AC
  Administered 2018-08-01: 1 g via INTRAVENOUS
  Filled 2018-08-01: qty 10

## 2018-08-01 MED ORDER — GABAPENTIN 400 MG PO CAPS
400.0000 mg | ORAL_CAPSULE | Freq: Two times a day (BID) | ORAL | Status: DC
  Administered 2018-08-01 – 2018-08-03 (×4): 400 mg via ORAL
  Filled 2018-08-01 (×4): qty 1

## 2018-08-01 MED ORDER — BACLOFEN 20 MG PO TABS: 20.0000 mg | ORAL_TABLET | ORAL | Status: DC

## 2018-08-01 MED ORDER — ONDANSETRON HCL 4 MG PO TABS: 4.0000 mg | ORAL_TABLET | Freq: Four times a day (QID) | ORAL | Status: DC | PRN

## 2018-08-01 MED ORDER — POTASSIUM CHLORIDE CRYS ER 20 MEQ PO TBCR
40.0000 meq | EXTENDED_RELEASE_TABLET | Freq: Once | ORAL | Status: AC
  Administered 2018-08-01: 40 meq via ORAL
  Filled 2018-08-01: qty 2

## 2018-08-01 MED ORDER — METOPROLOL TARTRATE 50 MG PO TABS
100.0000 mg | ORAL_TABLET | Freq: Two times a day (BID) | ORAL | Status: DC
  Administered 2018-08-01 – 2018-08-02 (×2): 100 mg via ORAL
  Filled 2018-08-01 (×4): qty 2

## 2018-08-01 MED ORDER — SIMVASTATIN 20 MG PO TABS
20.0000 mg | ORAL_TABLET | Freq: Every evening | ORAL | Status: DC
  Administered 2018-08-01 – 2018-08-02 (×2): 20 mg via ORAL
  Filled 2018-08-01 (×2): qty 1

## 2018-08-01 MED ORDER — POTASSIUM CHLORIDE 10 MEQ/100ML IV SOLN
10.0000 meq | INTRAVENOUS | Status: AC
  Administered 2018-08-01: 10 meq via INTRAVENOUS

## 2018-08-01 MED ORDER — FAMOTIDINE 20 MG PO TABS
20.0000 mg | ORAL_TABLET | Freq: Every day | ORAL | Status: DC
  Administered 2018-08-01 – 2018-08-03 (×3): 20 mg via ORAL
  Filled 2018-08-01 (×3): qty 1

## 2018-08-01 MED ORDER — ACETAMINOPHEN 325 MG PO TABS: 650.0000 mg | ORAL_TABLET | Freq: Four times a day (QID) | ORAL | Status: DC | PRN

## 2018-08-01 MED ORDER — CLOPIDOGREL BISULFATE 75 MG PO TABS
75.0000 mg | ORAL_TABLET | Freq: Every day | ORAL | Status: DC
  Administered 2018-08-02 – 2018-08-03 (×2): 75 mg via ORAL
  Filled 2018-08-01 (×2): qty 1

## 2018-08-01 MED ORDER — ASPIRIN EC 81 MG PO TBEC
81.0000 mg | DELAYED_RELEASE_TABLET | Freq: Every day | ORAL | Status: DC
  Administered 2018-08-02: 81 mg via ORAL
  Filled 2018-08-01: qty 1

## 2018-08-01 MED ORDER — ACETAMINOPHEN 500 MG PO TABS
1000.0000 mg | ORAL_TABLET | Freq: Three times a day (TID) | ORAL | Status: DC
  Administered 2018-08-01 – 2018-08-03 (×6): 1000 mg via ORAL
  Filled 2018-08-01 (×6): qty 2

## 2018-08-01 MED ORDER — POLYETHYLENE GLYCOL 3350 17 G PO PACK: 17.0000 g | PACK | Freq: Every day | ORAL | Status: DC | PRN

## 2018-08-01 MED ORDER — LEVOTHYROXINE SODIUM 75 MCG PO TABS
75.0000 ug | ORAL_TABLET | Freq: Every day | ORAL | Status: DC
  Administered 2018-08-02 – 2018-08-03 (×2): 75 ug via ORAL
  Filled 2018-08-01 (×2): qty 1

## 2018-08-01 MED ORDER — GABAPENTIN 400 MG PO CAPS: 400.0000 mg | ORAL_CAPSULE | ORAL | Status: DC

## 2018-08-01 NOTE — ED Notes (Signed)
ED Provider at bedside. 

## 2018-08-01 NOTE — H&P (Signed)
H&P        History and Physical    Kimberly VAUGHN JXB:[id number] DOB: [birth year] DOA: 08/01/2018  PCP: Merri Brunette, MD (Confirm with patient/family/NH records and if not entered, this has to be entered at [facility] point of entry) Patient coming from: Independent living facility [facility]  I have personally briefly reviewed patient's old medical records in [facility]  Chief Complaint: Confusion HPI: Kimberly Bass is a 85 y.o. female with medical history significant of coronary artery disease, hypertension presents with confusion.  Patient was treated for UTI by her PCP about a week ago.  She finished her course of Cipro on Saturday.  Her son noted she has been more confused than usual.  She is a mix of her days and nights and has had slow thinking.  Patient lives at a independent living facility.  Patient's daughter-in-law noticed that she has missed the last 2 days of her medications when they reviewed her medication box.  Patient denies any chest pain shortness of breath nausea vomiting but admits to some dysuria.  Her son states she gets a urinary tract infection about twice a year and does get confused with it.   ED Course: In the ED here patient was found to have a potassium of 2.6 and a magnesium of 1.6.  She did have some white cells and trace leukoesterase on her urinalysis.  She was afebrile here.  Did not have any leukocytosis.  She was also hemodynamically stable.  Patient was given potassium and magnesium supplementation in the ED as well as Rocephin.  Review of Systems: Positive for altered mental status and,dysuria  denies chest pain shortness of breath nausea vomiting  Past Medical History:  Diagnosis Date  . Arthritis    "mild; in all my joints" (04/27/2017)  . Benign essential tremor   . Coronary artery disease   . Cystitis with hematuria 04/26/2017  . Gait disorder   . H/O radioactive iodine thyroid ablation   . Hx of transient ischemic attack (TIA)   .  Hyperlipidemia   . Hypertension    "not since MI in 2007" (04/27/2017)  . Hyperthyroidism    H/O radioactive iodine thyroid ablation [Z92.3]  . Hypothyroidism   . Lower extremity myoclonus   . Lumbosacral spondylosis   . Migraine    "I've had one in my lifetime" (04/27/2017)  . Mild obesity   . Myocardial infarction (HCC) 12/2005  . Myoclonus    Lower extremity involvement  . Obesity   . Pneumonia    "lots when I was a small child; not at all since" (04/27/2017)  . Seizures (HCC)    History is questionable  . Thyroid disease     Past Surgical History:  Procedure Laterality Date  . ABDOMINAL HYSTERECTOMY  1972   for uterine fibroids/notes 11/10/2010  . ANTERIOR CERVICAL DECOMP/DISCECTOMY FUSION  03/2004   [name] 11/10/2010  . APPENDECTOMY    . BACK SURGERY    . BREAST BIOPSY Right 1996   nodule resected/notes 11/10/2010  . CARDIAC CATHETERIZATION  01/03/2006  . CATARACT EXTRACTION W/ INTRAOCULAR LENS  IMPLANT, BILATERAL Bilateral   . COLONOSCOPY  11/2003   [name] 11/10/2010  . ESOPHAGOGASTRODUODENOSCOPY  09/2006   [name] 11/10/2010  . LUMBAR DISC SURGERY  1990s X 1; 05/2003   "a nerve was pinching"; [name] 11/10/2010  . OVARY SURGERY Left 1999   benign tumor resected /notes 11/10/2010  . TONSILLECTOMY  ~ 1947     reports that she has  never smoked. She has never used smokeless tobacco. She reports that she does not drink alcohol or use drugs.  Allergies  Allergen Reactions  . Codeine Nausea Only  . Meperidine Hcl Nausea Only  . Morphine Nausea Only  . Penicillins Nausea Only    Has patient had a PCN reaction causing immediate rash, facial/tongue/throat swelling, SOB or lightheadedness with hypotension: Unk Has patient had a PCN reaction causing severe rash involving mucus membranes or skin necrosis: Unk Has patient had a PCN reaction that required hospitalization: Unk Has patient had a PCN reaction occurring within the last 10 years: Unk If all of the above answers are  "NO", then may proceed with Cephalosporin use.     Family History  Problem Relation Age of Onset  . Heart disease Father   . Heart attack Mother   . Stroke Sister   . Heart attack Sister   . Heart attack Brother        Possible  . Diabetes Sister      Prior to Admission medications   Medication Sig Start Date End Date Taking? Authorizing Provider  acetaminophen (TYLENOL ARTHRITIS PAIN) 650 MG CR tablet Take 1,300 mg by mouth 3 (three) times daily.    Yes [provider]  amLODipine (NORVASC) 2.5 MG tablet Take 1 tablet (2.5 mg total) by mouth daily. 06/07/18  Yes Tereso Newcomer T, PA-C  aspirin 81 MG tablet Take 81 mg by mouth daily.     Yes [provider]  baclofen (LIORESAL) 20 MG tablet Take 20-30 mg by mouth See admin instructions. 30 mg every morning, 20 mg every afternoon, 30 mg every evening 10/13/17  Yes [provider]  calcium-vitamin D 250-100 MG-UNIT tablet Take 2 tablets by mouth 2 (two) times daily.   Yes [provider]  clopidogrel (PLAVIX) 75 MG tablet TAKE 1 TABLET BY MOUTH  DAILY Patient taking differently: Take 75 mg by mouth daily after breakfast.  07/04/18  Yes Nahser, Deloris Ping, MD  gabapentin (NEURONTIN) 400 MG capsule TAKE 1 CAPSULE (400 MG TOTAL) BY MOUTH 4 (FOUR) TIMES DAILY. Patient taking differently: Take 400-800 mg by mouth See admin instructions. 400 mg every morning, 800 mg every afternoon, 400 mg at bedtime. 07/10/18  Yes Butch Penny, NP  isosorbide mononitrate (IMDUR) 60 MG 24 hr tablet Take 60 mg by mouth daily.   Yes [provider]  levothyroxine (SYNTHROID, LEVOTHROID) 75 MCG tablet Take 75 mcg by mouth daily.     Yes [provider]  metoprolol tartrate (LOPRESSOR) 100 MG tablet TAKE 1 TABLET BY MOUTH TWO  TIMES DAILY Patient taking differently: Take 100 mg by mouth 2 (two) times daily.  07/04/18  Yes Nahser, Deloris Ping, MD  mirabegron ER (MYRBETRIQ) 50 MG TB24 tablet Take 25 mg by mouth daily.     Yes [provider]  Multiple Vitamin (MULTIVITAMIN) tablet Take 1 tablet by mouth daily.     Yes [provider]  NITROSTAT 0.4 MG SL tablet DISSOLVE 1 TAB UNDER THE  TONGUE EVERY 5 MIN AS  NEEDED FOR CHEST PAIN. MAX  3 TOTAL DOSES/15MIN. 911 IF NO RELIEF 06/29/18  Yes Nahser, Deloris Ping, MD  ranitidine (ZANTAC) 150 MG tablet Take 150 mg by mouth 2 (two) times daily.     Yes [provider]  simvastatin (ZOCOR) 20 MG tablet Take 20 mg by mouth every evening.    Yes [provider]  traZODone (DESYREL) 50 MG tablet Take 75 mg by mouth  at bedtime.    Yes [provider]  triamterene-hydrochlorothiazide (DYAZIDE) 37.5-25 MG capsule Take 1 capsule by mouth daily.   Yes [provider]  vitamin B-12 (CYANOCOBALAMIN) 1000 MCG tablet Take 1,000 mcg by mouth daily.   Yes [provider]    Physical Exam: Vitals:   08/01/18 1355 08/01/18 1441 08/01/18 1500 08/01/18 1700  BP:   (!) 155/89 (!) 165/96  Pulse:   88 93  Resp:   15 17  Temp:  98.4 F (36.9 C)    TempSrc:  Rectal    SpO2:   94% 90%  Weight: 67.6 kg       Constitutional: NAD, calm, comfortable Vitals:   08/01/18 1355 08/01/18 1441 08/01/18 1500 08/01/18 1700  BP:   (!) 155/89 (!) 165/96  Pulse:   88 93  Resp:   15 17  Temp:  98.4 F (36.9 C)    TempSrc:  Rectal    SpO2:   94% 90%  Weight: 67.6 kg      Eyes:, lids and conjunctivae normal ENMT: Mucous membranes are moist. Posterior pharynx clear of any exudate or lesions.  Neck: normal, supple, Respiratory: clear to auscultation bilaterally, no wheezing, no crackles. Normal respiratory effort. No accessory muscle use.  Cardiovascular: Regular rate and rhythm,  No extremity edema Abdomen: no tenderness, no masses palpated. No hepatosplenomegaly. Bowel sounds positive.  Neurologic: Moves all 4 extremities equally , extraocular muscles intact  psychiatric: . Alert  Normal mood. oriented to self     Labs on Admission:  I have personally reviewed following labs and imaging studies  CBC: Recent Labs  Lab 08/01/18 1639  WBC 6.7  NEUTROABS 4.6  HGB 15.3*  HCT 46.7*  MCV 89.6  PLT 166   Basic Metabolic Panel: Recent Labs  Lab 08/01/18 1438 08/01/18 1639  NA 144  --   K 2.6*  --   CL 113*  --   CO2 21*  --   GLUCOSE 82  --   BUN 18  --   CREATININE 0.53  --   CALCIUM 7.7*  --   MG  --  1.6*   GFR: Estimated Creatinine Clearance: 39.1 mL/min (by C-G formula based on SCr of 0.53 mg/dL). Liver Function Tests: Recent Labs  Lab 08/01/18 1438  AST 34  ALT 26  ALKPHOS 35*  BILITOT 0.8  PROT 5.8*  ALBUMIN 3.3*   No results for input(s): LIPASE, AMYLASE in the last 168 hours. No results for input(s): AMMONIA in the last 168 hours. Coagulation Profile: No results for input(s): INR, PROTIME in the last 168 hours. Cardiac Enzymes: No results for input(s): CKTOTAL, CKMB, CKMBINDEX, TROPONINI in the last 168 hours. BNP (last 3 results) No results for input(s): PROBNP in the last 8760 hours. HbA1C: No results for input(s): HGBA1C in the last 72 hours. CBG: No results for input(s): GLUCAP in the last 168 hours. Lipid Profile: No results for input(s): CHOL, HDL, LDLCALC, TRIG, CHOLHDL, LDLDIRECT in the last 72 hours. Thyroid Function Tests: No results for input(s): TSH, T4TOTAL, FREET4, T3FREE, THYROIDAB in the last 72 hours. Anemia Panel: No results for input(s): VITAMINB12, FOLATE, FERRITIN, TIBC, IRON, RETICCTPCT in the last 72 hours. Urine analysis:    Component Value Date/Time   COLORURINE YELLOW 08/01/2018 1438   APPEARANCEUR CLOUDY (A) 08/01/2018 1438   LABSPEC 1.013 08/01/2018 1438   PHURINE 8.0 08/01/2018 1438   GLUCOSEU NEGATIVE 08/01/2018 1438   HGBUR NEGATIVE 08/01/2018 1438   BILIRUBINUR NEGATIVE 08/01/2018  1438   KETONESUR NEGATIVE 08/01/2018 1438   PROTEINUR NEGATIVE 08/01/2018 1438   NITRITE NEGATIVE 08/01/2018 1438   LEUKOCYTESUR TRACE (A) 08/01/2018 1438     Radiological Exams on Admission: No results found.    Assessment/Plan Principal Problem:   AMS (altered mental status) Active Problems:   Hypokalemia   Pyuria   CAD (coronary artery disease)   Hypothyroidism   Essential hypertension   Altered mental status    -Observation medical bed IV Rocephin follow urine culture -Electrolytes replaced in the ED check BMP in the a.m. -Continue home medications for chronic medical issues   DVT prophylaxis: lovenox Code Status: DNR  Family Communication: only son at bedside  Disposition Plan: back to independent living 1-2 days Admission status: obs med   Synetta FailEndia Johnson-Pitts MD [facility] Pager [phone]- [phone]  If 7PM-7AM, please contact night-coverage [email or web address] Password [email or web address]  08/01/2018, 7:55 PM

## 2018-08-01 NOTE — ED Notes (Signed)
Family at bedside. 

## 2018-08-01 NOTE — ED Triage Notes (Signed)
Pt arrives via EMS from [facility]. Pt was in the salon today getting her hair done and per stylist pts balance is off and she is more altered than normal. Pt just finished abx. Yesterday for UTI.

## 2018-08-01 NOTE — ED Provider Notes (Signed)
[facility] DEPT Provider Note   CSN: [id number] Arrival date & time: 08/01/18  1317     History   Chief Complaint Chief Complaint  Patient presents with  . Altered Mental Status    HPI Kimberly Bass is a 85 y.o. female.  HPI   Patient is a 85 year old female with a history of benign essential tremor, CAD, TIA, hyperlipidemia, hypertension, MI, who presents the emergency department today for evaluation of altered mental status.  Patient son is at bedside and assist with the history.  He states that the patient has been altered for the last week.  He states she has been more confused.  He states that she has a frequent history of UTI and every time she gets a urinary tract infection she has similar symptoms.  She was recently diagnosed with a urinary tract infection by her PCP last week.  She was started on ciprofloxacin.  He states that she currently lives in an independent living facility and administers her own medications.  He has been calling daily to remind her to take her medications however he knows that she has missed several days of the medication.  Patient denies any fevers.  She is reporting suprapubic pain.  She is had no vomiting.  No chest pain or shortness of breath.  She does complain of dysuria.  Patient denies any numbness or weakness.  No headaches.  Past Medical History:  Diagnosis Date  . Arthritis    "mild; in all my joints" (04/27/2017)  . Benign essential tremor   . Coronary artery disease   . Cystitis with hematuria 04/26/2017  . Gait disorder   . H/O radioactive iodine thyroid ablation   . Hx of transient ischemic attack (TIA)   . Hyperlipidemia   . Hypertension    "not since MI in 2007" (04/27/2017)  . Hyperthyroidism    H/O radioactive iodine thyroid ablation [Z92.3]  . Hypothyroidism   . Lower extremity myoclonus   . Lumbosacral spondylosis   . Migraine    "I've had one in my lifetime" (04/27/2017)  . Mild  obesity   . Myocardial infarction ([place]) 12/2005  . Myoclonus    Lower extremity involvement  . Obesity   . Pneumonia    "lots when I was a small child; not at all since" (04/27/2017)  . Seizures ([place])    History is questionable  . Thyroid disease     Patient Active Problem List   Diagnosis Date Noted  . Pyuria 08/01/2018  . AMS (altered mental status) 08/01/2018  . Altered mental status 08/01/2018  . Hypothyroidism 12/12/2017  . Acute encephalopathy 12/12/2017  . Unwitnessed fall 12/12/2017  . Essential hypertension 12/12/2017  . Hypokalemia 12/12/2017  . Hyperlipidemia 05/05/2017  . Tremor, essential 11/03/2015  . 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 Abnormality of gait 08/15/2012  . Essential and other specified forms of tremor 08/15/2012  . Degeneration of lumbar or lumbosacral intervertebral disc 08/15/2012  . Hx of low back pain 08/15/2012  . CAD (coronary artery disease) 12/23/2010  . Hx of transient ischemic attack (TIA)   . CONSTIPATION 02/27/2009  . PERSONAL HX COLONIC POLYPS 02/27/2009    Past Surgical History:  Procedure Laterality Date  . ABDOMINAL HYSTERECTOMY  1972   for uterine fibroids/notes 11/10/2010  . ANTERIOR CERVICAL DECOMP/DISCECTOMY FUSION  03/2004   Archie Endo 11/10/2010  . APPENDECTOMY    . BACK SURGERY    . BREAST BIOPSY Right 1996   nodule resected/notes 11/10/2010  .  CARDIAC CATHETERIZATION  01/03/2006  . CATARACT EXTRACTION W/ INTRAOCULAR LENS  IMPLANT, BILATERAL Bilateral   . COLONOSCOPY  11/2003   Archie Endo 11/10/2010  . ESOPHAGOGASTRODUODENOSCOPY  09/2006   Archie Endo 11/10/2010  . LUMBAR DISC SURGERY  1990s X 1; 05/2003   "a nerve was pinching"; Archie Endo 11/10/2010  . OVARY SURGERY Left 1999   benign tumor resected /notes 11/10/2010  . TONSILLECTOMY  ~ 1947     OB History   No obstetric history on file.      Home Medications    Prior to Admission medications   Medication Sig Start Date End Date Taking? Authorizing Provider  acetaminophen (TYLENOL  ARTHRITIS PAIN) 650 MG CR tablet Take 1,300 mg by mouth 3 (three) times daily.    Yes [provider]  amLODipine (NORVASC) 2.5 MG tablet Take 1 tablet (2.5 mg total) by mouth daily. 06/07/18  Yes Richardson Dopp T, PA-C  aspirin 81 MG tablet Take 81 mg by mouth daily.     Yes [provider]  baclofen (LIORESAL) 20 MG tablet Take 20-30 mg by mouth See admin instructions. 30 mg every morning, 20 mg every afternoon, 30 mg every evening 10/13/17  Yes [provider]  calcium-vitamin D 250-100 MG-UNIT tablet Take 2 tablets by mouth 2 (two) times daily.   Yes [provider]  clopidogrel (PLAVIX) 75 MG tablet TAKE 1 TABLET BY MOUTH  DAILY Patient taking differently: Take 75 mg by mouth daily after breakfast.  07/04/18  Yes Nahser, Wonda Cheng, MD  gabapentin (NEURONTIN) 400 MG capsule TAKE 1 CAPSULE (400 MG TOTAL) BY MOUTH 4 (FOUR) TIMES DAILY. Patient taking differently: Take 400-800 mg by mouth See admin instructions. 400 mg every morning, 800 mg every afternoon, 400 mg at bedtime. 07/10/18  Yes Ward Givens, NP  isosorbide mononitrate (IMDUR) 60 MG 24 hr tablet Take 60 mg by mouth daily.   Yes [provider]  levothyroxine (SYNTHROID, LEVOTHROID) 75 MCG tablet Take 75 mcg by mouth daily.     Yes [provider]  metoprolol tartrate (LOPRESSOR) 100 MG tablet TAKE 1 TABLET BY MOUTH TWO  TIMES DAILY Patient taking differently: Take 100 mg by mouth 2 (two) times daily.  07/04/18  Yes Nahser, Wonda Cheng, MD  mirabegron ER (MYRBETRIQ) 50 MG TB24 tablet Take 25 mg by mouth daily.    Yes [provider]  Multiple Vitamin (MULTIVITAMIN) tablet Take 1 tablet by mouth daily.     Yes [provider]  NITROSTAT 0.4 MG SL tablet DISSOLVE 1 TAB UNDER THE  TONGUE EVERY 5 MIN AS  NEEDED FOR CHEST PAIN. MAX  3 TOTAL DOSES/15MIN. 911 IF NO RELIEF 06/29/18  Yes Nahser, Wonda Cheng, MD  ranitidine (ZANTAC) 150 MG tablet Take 150 mg by mouth 2 (two) times daily.      Yes [provider]  simvastatin (ZOCOR) 20 MG tablet Take 20 mg by mouth every evening.    Yes [provider]  traZODone (DESYREL) 50 MG tablet Take 75 mg by mouth at bedtime.    Yes [provider]  triamterene-hydrochlorothiazide (DYAZIDE) 37.5-25 MG capsule Take 1 capsule by mouth daily.   Yes [provider]  vitamin B-12 (CYANOCOBALAMIN) 1000 MCG tablet Take 1,000 mcg by mouth daily.   Yes [provider]    Family History Family History  Problem Relation Age of Onset  . Heart disease Father   . Heart attack Mother   . Stroke Sister   . Heart attack Sister   .  Heart attack Brother        Possible  . Diabetes Sister     Social History Social History   Tobacco Use  . Smoking status: Never Smoker  . Smokeless tobacco: Never Used  Substance Use Topics  . Alcohol use: No  . Drug use: No     Allergies   Codeine; Meperidine hcl; Morphine; and Penicillins   Review of Systems Review of Systems  Constitutional: Negative for fever.  HENT: Negative for sore throat.   Eyes: Negative for photophobia.  Respiratory: Negative for cough and shortness of breath.   Cardiovascular: Negative for chest pain.  Gastrointestinal: Negative for abdominal pain, constipation, diarrhea, nausea and vomiting.  Genitourinary: Positive for dysuria and pelvic pain. Negative for hematuria.  Musculoskeletal: Negative for back pain.  Skin: Negative for rash.  Neurological: Negative for seizures, syncope, weakness and numbness.       Confused  All other systems reviewed and are negative.   Physical Exam Updated Vital Signs BP (!) 165/96   Pulse 93   Temp 98.4 F (36.9 C) (Rectal)   Resp 17   Wt 67.6 kg   SpO2 90%   BMI 30.09 kg/m   Physical Exam Vitals signs and nursing note reviewed.  Constitutional:      General: She is not in acute distress.    Appearance: She is well-developed. She is not ill-appearing.  HENT:     Head:  Normocephalic and atraumatic.  Eyes:     Conjunctiva/sclera: Conjunctivae normal.  Neck:     Musculoskeletal: Neck supple.  Cardiovascular:     Rate and Rhythm: Normal rate and regular rhythm.     Heart sounds: Normal heart sounds. No murmur.  Pulmonary:     Effort: Pulmonary effort is normal. No respiratory distress.     Breath sounds: Normal breath sounds.  Abdominal:     Palpations: Abdomen is soft.     Tenderness: There is no abdominal tenderness.  Skin:    General: Skin is warm and dry.  Neurological:     Mental Status: She is alert.     Comments: Patient is oriented x3.  She answers questions appropriately.  She is able to follow commands.  Her speech is clear.   Cranial Nerves:  II:  Peripheral visual fields grossly normal, pupils equal, round, reactive to light III,IV, VI: ptosis not present, extra-ocular motions intact bilaterally  V,VII: smile symmetric, facial light touch sensation equal VIII: hearing grossly normal to voice  X: uvula elevates symmetrically  XI: bilateral shoulder shrug symmetric and strong XII: midline tongue extension without fassiculations Motor:  Normal tone. 5/5 strength of BUE and BLE major muscle groups including strong and equal grip strength and dorsiflexion/plantar flexion Sensory: light touch normal in all extremities. Cerebellar: normal finger-to-nose with bilateral upper extremities Negative pronator drift   Psychiatric:     Comments: Pleasantly confused      ED Treatments / Results  Labs (all labs ordered are listed, but only abnormal results are displayed) Labs Reviewed  URINALYSIS, ROUTINE W REFLEX MICROSCOPIC - Abnormal; Notable for the following components:      Result Value   APPearance CLOUDY (*)    Leukocytes, UA TRACE (*)    All other components within normal limits  COMPREHENSIVE METABOLIC PANEL - Abnormal; Notable for the following components:   Potassium 2.6 (*)    Chloride 113 (*)    CO2 21 (*)    Calcium 7.7  (*)    Total Protein 5.8 (*)  Albumin 3.3 (*)    Alkaline Phosphatase 35 (*)    All other components within normal limits  CBC WITH DIFFERENTIAL/PLATELET - Abnormal; Notable for the following components:   RBC 5.21 (*)    Hemoglobin 15.3 (*)    HCT 46.7 (*)    All other components within normal limits  MAGNESIUM - Abnormal; Notable for the following components:   Magnesium 1.6 (*)    All other components within normal limits  URINE CULTURE  CBC WITH DIFFERENTIAL/PLATELET    EKG EKG Interpretation  Date/Time:  Tuesday August 01 2018 13:59:39 EST Ventricular Rate:  85 PR Interval:    QRS Duration: 115 QT Interval:  401 QTC Calculation: 477 R Axis:   -61 Text Interpretation:  Sinus rhythm Atrial premature complex Prolonged PR interval Nonspecific IVCD with LAD Inferior infarct, old Probable anterior infarct, old Confirmed by Dene Gentry [phone]) on 08/01/2018 2:47:05 PM   Radiology No results found.  Procedures Procedures (including critical care time)  Medications Ordered in ED Medications  potassium chloride 10 mEq in 100 mL IVPB (10 mEq Intravenous Not Given 08/01/18 1851)  magnesium sulfate IVPB 2 g 50 mL (2 g Intravenous New Bag/Given 08/01/18 1845)  potassium chloride 10 mEq in 100 mL IVPB (10 mEq Intravenous New Bag/Given 08/01/18 1842)  cefTRIAXone (ROCEPHIN) 1 g in sodium chloride 0.9 % 100 mL IVPB ( Intravenous Stopped 08/01/18 1634)  potassium chloride SA (K-DUR,KLOR-CON) CR tablet 40 mEq (40 mEq Oral Given 08/01/18 1701)     Initial Impression / Assessment and Plan / ED Course  I have reviewed the triage vital signs and the nursing notes.  Pertinent labs & imaging results that were available during my care of the patient were reviewed by me and considered in my medical decision making (see chart for details).  Final Clinical Impressions(s) / ED Diagnoses   Final diagnoses:  Urinary tract infection with hematuria, site unspecified  Hypokalemia  Hypomagnesemia    Patient presenting with altered mental status for the last week that family attributes to recent diagnosis of urinary tract infection.  They states she has a current history of urinary tract infection and typically gets confused when she is diagnosed with them.  She was given Rx for Cipro however she missed a few doses of this as an outpatient and she is still symptomatic with some suprapubic pain and dysuria.  She is afebrile here and does not meet sepsis criteria.  CBC with no leukocytosis.  Hemoglobin slightly elevated at 15.3, could be secondary to chemo concentration. CMP with hypokalemia at 2.6, no evidence of prolonged QT on EKG.  Mildly low CO2 at 21.  Calcium low at 7.7 and protein at 5.8.  Any function liver function are normal.  No elevated anion gap. Magnesium is low at 1.6.  Potassium and magnesium were supplemented in the ED.  UA with trace leukocytes, 6-10 RBCs, 11-20 white blood cells and no bacteria seen.Marland Kitchen  0-5 squamous epithelial cells.  Urine culture was obtained.  Given these findings, pts sxs and knowledge the pt was partially tx for recent UTI will treat with rocephin for suspected persistent UTI.   Sinus rhythm Atrial premature complex Prolonged PR interval Nonspecific IVCD with LAD Inferior infarct, old Probable anterior infarct, old.  Patient was seen in conjunction with Dr. Francia Greaves who personally evaluated the patient is in the group met with the plan for admission for further treatment of urinary tract infection.  He is in agreement to forego imaging in the ED  given that symptoms are more likely secondary to urinary tract infection and patient's neuro exam is nonfocal.   Case discussed with Dr. Baron Hamper who accepts patient for admission.   ED Discharge Orders    None       Bishop Dublin 08/01/18 1859    Valarie Merino, MD 08/05/18 [phone]

## 2018-08-01 NOTE — ED Notes (Signed)
Bed: NX83 Expected date:  Expected time:  Means of arrival:  Comments: EMS- AMS/tazed

## 2018-08-01 NOTE — ED Notes (Signed)
ED TO INPATIENT HANDOFF REPORT  Name/Age/Gender Kimberly Bass 85 y.o. female  Code Status    Code Status Orders  (From admission, onward)         Start     Ordered   08/01/18 1948  Do not attempt resuscitation (DNR)  Continuous    Question Answer Comment  In the event of cardiac or respiratory ARREST Do not call a "code blue"   In the event of cardiac or respiratory ARREST Do not perform Intubation, CPR, defibrillation or ACLS   In the event of cardiac or respiratory ARREST Use medication by any route, position, wound care, and other measures to relive pain and suffering. May use oxygen, suction and manual treatment of airway obstruction as needed for comfort.      08/01/18 1947        Code Status History    Date Active Date Inactive Code Status Order ID Comments User Context   12/13/2017 0028 12/15/2017 2130 Full Code [id number]  Briscoe Deutscherpyd, Timothy S, MD ED   04/27/2017 0204 04/28/2017 1956 Full Code [id number]  Hillary BowGardner, Jared M, DO ED    Advance Directive Documentation     Most Recent Value  Type of Advance Directive  Healthcare Power of Attorney, Living will  Pre-existing out of facility DNR order (yellow form or pink MOST form)  -  "MOST" Form in Place?  -      Home/SNF/Other Nursing Home  Chief Complaint UTI; Poss Sepsis  Level of Care/Admitting Diagnosis ED Disposition    ED Disposition Condition Comment   Admit  [facility] [[id number]  Level of Care: Med-Surg [16]  Diagnosis: Altered mental status [780.97.ICD-9-CM]  Admitting Physician: Synetta FailJOHNSON-PITTS, ENDIA [[id number]]  Attending Physician: Synetta FailJOHNSON-PITTS, ENDIA [[id number]]  PT Class (Do Not Modify): Observation [104]  PT Acc Code (Do Not Modify): Observation [10022]       Medical History Past Medical History:  Diagnosis Date  . Arthritis    "mild; in all my joints" (04/27/2017)  . Benign essential tremor   . Coronary artery disease   . Cystitis with hematuria 04/26/2017   . Gait disorder   . H/O radioactive iodine thyroid ablation   . Hx of transient ischemic attack (TIA)   . Hyperlipidemia   . Hypertension    "not since MI in 2007" (04/27/2017)  . Hyperthyroidism    H/O radioactive iodine thyroid ablation [Z92.3]  . Hypothyroidism   . Lower extremity myoclonus   . Lumbosacral spondylosis   . Migraine    "I've had one in my lifetime" (04/27/2017)  . Mild obesity   . Myocardial infarction (HCC) 12/2005  . Myoclonus    Lower extremity involvement  . Obesity   . Pneumonia    "lots when I was a small child; not at all since" (04/27/2017)  . Seizures (HCC)    History is questionable  . Thyroid disease     Allergies Allergies  Allergen Reactions  . Codeine Nausea Only  . Meperidine Hcl Nausea Only  . Morphine Nausea Only  . Penicillins Nausea Only    Has patient had a PCN reaction causing immediate rash, facial/tongue/throat swelling, SOB or lightheadedness with hypotension: Unk Has patient had a PCN reaction causing severe rash involving mucus membranes or skin necrosis: Unk Has patient had a PCN reaction that required hospitalization: Unk Has patient had a PCN reaction occurring within the last 10 years: Unk If all of the above answers are "NO", then may proceed with Cephalosporin  use.     IV Location/Drains/Wounds Patient Lines/Drains/Airways Status   Active Line/Drains/Airways    Name:   Placement date:   Placement time:   Site:   Days:   Peripheral IV 08/01/18 Left Antecubital   08/01/18    -    Antecubital   less than 1          Labs/Imaging Results for orders placed or performed during the hospital encounter of 08/01/18 (from the past 48 hour(s))  Urinalysis, Routine w reflex microscopic     Status: Abnormal   Collection Time: 08/01/18  2:38 PM  Result Value Ref Range   Color, Urine YELLOW YELLOW   APPearance CLOUDY (A) CLEAR   Specific Gravity, Urine 1.013 1.005 - 1.030   pH 8.0 5.0 - 8.0   Glucose, UA NEGATIVE NEGATIVE  mg/dL   Hgb urine dipstick NEGATIVE NEGATIVE   Bilirubin Urine NEGATIVE NEGATIVE   Ketones, ur NEGATIVE NEGATIVE mg/dL   Protein, ur NEGATIVE NEGATIVE mg/dL   Nitrite NEGATIVE NEGATIVE   Leukocytes, UA TRACE (A) NEGATIVE   RBC / HPF 6-10 0 - 5 RBC/hpf   WBC, UA 11-20 0 - 5 WBC/hpf   Bacteria, UA NONE SEEN NONE SEEN   Squamous Epithelial / LPF 0-5 0 - 5   Amorphous Crystal PRESENT     Comment: Performed at [facility], [address] [place], [place] [address]  Comprehensive metabolic panel     Status: Abnormal   Collection Time: 08/01/18  2:38 PM  Result Value Ref Range   Sodium 144 135 - 145 mmol/L   Potassium 2.6 (LL) 3.5 - 5.1 mmol/L    Comment: CRITICAL RESULT CALLED TO, READ BACK BY AND VERIFIED WITH: S.Legacy Lacivita AT 1633 ON 08/01/18 BY N.THOMPSON    Chloride 113 (H) 98 - 111 mmol/L   CO2 21 (L) 22 - 32 mmol/L   Glucose, Bld 82 70 - 99 mg/dL   BUN 18 8 - 23 mg/dL   Creatinine, Ser 6.040.53 0.44 - 1.00 mg/dL   Calcium 7.7 (L) 8.9 - 10.3 mg/dL   Total Protein 5.8 (L) 6.5 - 8.1 g/dL   Albumin 3.3 (L) 3.5 - 5.0 g/dL   AST 34 15 - 41 U/L   ALT 26 0 - 44 U/L   Alkaline Phosphatase 35 (L) 38 - 126 U/L   Total Bilirubin 0.8 0.3 - 1.2 mg/dL   GFR calc non Af Amer >60 >60 mL/min   GFR calc Af Amer >60 >60 mL/min   Anion gap 10 5 - 15    Comment: Performed at [facility], [address] [place], [place] [address]  CBC with Differential/Platelet     Status: Abnormal   Collection Time: 08/01/18  4:39 PM  Result Value Ref Range   WBC 6.7 4.0 - 10.5 K/uL   RBC 5.21 (H) 3.87 - 5.11 MIL/uL   Hemoglobin 15.3 (H) 12.0 - 15.0 g/dL   HCT 11.946.7 (H) 14.736.0 - 82.946.0 %   MCV 89.6 80.0 - 100.0 fL   MCH 29.4 26.0 - 34.0 pg   MCHC 32.8 30.0 - 36.0 g/dL   RDW 56.214.0 13.011.5 - 86.515.5 %   Platelets 166 150 - 400 K/uL   nRBC 0.0 0.0 - 0.2 %   Neutrophils Relative % 68 %   Neutro Abs 4.6 1.7 - 7.7 K/uL   Lymphocytes Relative 23 %   Lymphs Abs 1.5 0.7 - 4.0 K/uL    Monocytes Relative 7 %  Monocytes Absolute 0.5 0.1 - 1.0 K/uL   Eosinophils Relative 2 %   Eosinophils Absolute 0.2 0.0 - 0.5 K/uL   Basophils Relative 0 %   Basophils Absolute 0.0 0.0 - 0.1 K/uL   Immature Granulocytes 0 %   Abs Immature Granulocytes 0.02 0.00 - 0.07 K/uL    Comment: Performed at [facility], [address], [place], [place] [address]  Magnesium     Status: Abnormal   Collection Time: 08/01/18  4:39 PM  Result Value Ref Range   Magnesium 1.6 (L) 1.7 - 2.4 mg/dL    Comment: Performed at [facility], [address] [place], [place] [address]   No results found. EKG Interpretation  Date/Time:  Tuesday August 01 2018 13:59:39 EST Ventricular Rate:  85 PR Interval:    QRS Duration: 115 QT Interval:  401 QTC Calculation: 477 R Axis:   -61 Text Interpretation:  Sinus rhythm Atrial premature complex Prolonged PR interval Nonspecific IVCD with LAD Inferior infarct, old Probable anterior infarct, old Confirmed by Kristine Royal [phone]) on 08/01/2018 2:47:05 PM   Pending Labs Unresulted Labs (From admission, onward)    Start     Ordered   08/02/18 0500  Basic metabolic panel  Tomorrow morning,   R     08/01/18 1947   08/01/18 1431  CBC with Differential  ONCE - STAT,   STAT     08/01/18 1430   08/01/18 1431  Urine culture  ONCE - STAT,   STAT     08/01/18 1430   Signed and Held  Creatinine, serum  (enoxaparin (LOVENOX)    CrCl >/= 30 ml/min)  Weekly,   R    Comments:  while on enoxaparin therapy    Signed and Held          Vitals/Pain Today's Vitals   08/01/18 1500 08/01/18 1700 08/01/18 1900 08/01/18 2000  BP: (!) 155/89 (!) 165/96 (!) 152/75 (!) 152/71  Pulse: 88 93 86 80  Resp: 15 17 (!) 21 (!) 22  Temp:      TempSrc:      SpO2: 94% 90% 95% 94%  Weight:        Isolation Precautions No active isolations  Medications Medications  potassium chloride 10 mEq in 100 mL IVPB (10 mEq Intravenous Not Given  08/01/18 1851)  potassium chloride 10 mEq in 100 mL IVPB (10 mEq Intravenous New Bag/Given 08/01/18 1842)  cefTRIAXone (ROCEPHIN) 1 g in sodium chloride 0.9 % 100 mL IVPB ( Intravenous Stopped 08/01/18 1634)  potassium chloride SA (K-DUR,KLOR-CON) CR tablet 40 mEq (40 mEq Oral Given 08/01/18 1701)  magnesium sulfate IVPB 2 g 50 mL ( Intravenous Stopped 08/01/18 1847)    Mobility walks

## 2018-08-02 DIAGNOSIS — R319 Hematuria, unspecified: Secondary | ICD-10-CM

## 2018-08-02 DIAGNOSIS — R0689 Other abnormalities of breathing: Secondary | ICD-10-CM | POA: Diagnosis not present

## 2018-08-02 DIAGNOSIS — E039 Hypothyroidism, unspecified: Principal | ICD-10-CM

## 2018-08-02 DIAGNOSIS — I959 Hypotension, unspecified: Secondary | ICD-10-CM | POA: Diagnosis not present

## 2018-08-02 DIAGNOSIS — I2510 Atherosclerotic heart disease of native coronary artery without angina pectoris: Secondary | ICD-10-CM | POA: Diagnosis not present

## 2018-08-02 DIAGNOSIS — N390 Urinary tract infection, site not specified: Secondary | ICD-10-CM

## 2018-08-02 DIAGNOSIS — R410 Disorientation, unspecified: Secondary | ICD-10-CM | POA: Diagnosis not present

## 2018-08-02 DIAGNOSIS — G9341 Metabolic encephalopathy: Secondary | ICD-10-CM | POA: Diagnosis not present

## 2018-08-02 DIAGNOSIS — I10 Essential (primary) hypertension: Secondary | ICD-10-CM | POA: Diagnosis not present

## 2018-08-02 DIAGNOSIS — R52 Pain, unspecified: Secondary | ICD-10-CM | POA: Diagnosis not present

## 2018-08-02 LAB — URINE CULTURE: CULTURE: NO GROWTH

## 2018-08-02 LAB — BASIC METABOLIC PANEL
Anion gap: 6 (ref 5–15)
BUN: 18 mg/dL (ref 8–23)
CO2: 26 mmol/L (ref 22–32)
Calcium: 8.8 mg/dL — ABNORMAL LOW (ref 8.9–10.3)
Chloride: 109 mmol/L (ref 98–111)
Creatinine, Ser: 0.57 mg/dL (ref 0.44–1.00)
GFR calc Af Amer: >60 mL/min (ref >60)
GFR calc non Af Amer: >60 mL/min (ref >60)
Glucose, Bld: 102 mg/dL — ABNORMAL HIGH (ref 70–99)
Potassium: 3.7 mmol/L (ref 3.5–5.1)
Sodium: 141 mmol/L (ref 135–145)

## 2018-08-02 LAB — MAGNESIUM: Magnesium: 2.1 mg/dL (ref 1.7–2.4)

## 2018-08-02 MED ORDER — SODIUM CHLORIDE 0.9 % IV SOLN
INTRAVENOUS | Status: DC | PRN
  Administered 2018-08-02: 500 mL via INTRAVENOUS

## 2018-08-02 NOTE — Care Management Note (Signed)
Case Management Note  Patient Details  Name: DOREN LAMOTTE MRN: [id number] Date of Birth: 11/26/1925  Subjective/Objective: AMS. From [facility]-They use [facility] for [facility]. PT-recc HHPT-[facility] rep Kandee Keen aware of referral, await d/c order.                  Action/Plan:d/c back to [facility] w/HHPT.   Expected Discharge Date:  (unknown)               Expected Discharge Plan:  Home w Home Health Services  In-House Referral:     Discharge planning Services  CM Consult  Post Acute Care Choice:    Choice offered to:     DME Arranged:    DME Agency:     HH Arranged:    HH Agency:     Status of Service:  In process, will continue to follow  If discussed at Long Length of Stay Meetings, dates discussed:    Additional Comments:  Lanier Clam, RN 08/02/2018, 12:32 PM

## 2018-08-02 NOTE — Progress Notes (Signed)
PROGRESS NOTE    Pearlean BrownieJeanneane H Naeve  ZOX:[id number] DOB: 08/26/1925 DOA: 08/01/2018 PCP: Merri BrunettePharr, Walter, MD    Brief Narrative:  Patient is a 85 year old lady who lives in independent living facility medical history of coronary artery disease, hypertension recently diagnosed with a UTI by PCP approximately 1 week prior to admission who was symptomatic with dysuria.  Patient treated with a course of ciprofloxacin however patient with no significant improvement with ongoing dysuria and worsening confusion.  Patient on admission noted to be hypokalemic and hypomagnesemic.  Urine cultures sent.  Patient placed empirically on IV Rocephin.   Assessment & Plan:   Principal Problem:   AMS (altered mental status) Active Problems:   CAD (coronary artery disease)   Urinary tract infection with hematuria   Hypothyroidism   Essential hypertension   Hypokalemia   Pyuria   Altered mental status   Acute metabolic encephalopathy   Hypomagnesemia  1 acute metabolic encephalopathy likely secondary to urinary tract infection Likely secondary to UTI.  Patient presented worsening confusion and dysuria with recent treatment on oral ciprofloxacin however no significant improvement and may likely have failed outpatient treatment.  Urine cultures obtained however patient recently on oral antibiotics and received a dose of IV Rocephin and as such cultures likely will remain negative.  Patient still with some dysuria however improving.  Patient's mentation improving clinically however not at baseline.  Continue IV Rocephin, supportive care.  Follow.  2.  Hypothyroidism Continue home dose Synthroid.  3.  Hypokalemia/hypomagnesemia Repleted.  Follow.  4.  Hypertension Continue Norvasc, Lopressor, Dyazide.  Follow.  5.  Coronary artery disease Stable.  Continue Norvasc, Lopressor, Plavix, aspirin.   DVT prophylaxis: Lovenox Code Status: Full Family Communication: Updated patient and son at  bedside. Disposition Plan: Likely back to independent living facility with home health following hopefully in the next 24 to 48 hours.   Consultants:   None  Procedures:   None  Antimicrobials:   IV Rocephin 08/01/2018     Subjective: Patient sitting up in chair.  Alert to self place and time.  Confusion improving however not at baseline.  Still with some complaints of dysuria however feeling better than on admission.  Denies any chest pain.  No shortness of breath.  Objective: Vitals:   08/01/18 2056 08/01/18 2117 08/02/18 0613 08/02/18 1447  BP: (!) 155/84  (!) 155/78 113/60  Pulse: 85  67 72  Resp: 20  20 16   Temp: 97.8 F (36.6 C)  98.2 F (36.8 C) (!) 97.5 F (36.4 C)  TempSrc: Oral  Oral Oral  SpO2: 95%  95% 96%  Weight:  67.6 kg    Height:  4\' 11"  (1.499 m)      Intake/Output Summary (Last 24 hours) at 08/02/2018 1856 Last data filed at 08/02/2018 1751 Gross per 24 hour  Intake 940.56 ml  Output 750 ml  Net 190.56 ml   Filed Weights   08/01/18 1355 08/01/18 2117  Weight: 67.6 kg 67.6 kg    Examination:  General exam: Appears calm and comfortable  Respiratory system: Clear to auscultation. Respiratory effort normal. Cardiovascular system: S1 & S2 heard, RRR. No JVD, murmurs, rubs, gallops or clicks. No pedal edema. Gastrointestinal system: Abdomen is nondistended, soft and nontender. No organomegaly or masses felt. Normal bowel sounds heard. Central nervous system: Alert and oriented. No focal neurological deficits. Extremities: Symmetric 5 x 5 power. Skin: No rashes, lesions or ulcers Psychiatry: Judgement and insight appear normal. Mood & affect appropriate.  Data Reviewed: I have personally reviewed following labs and imaging studies  CBC: Recent Labs  Lab 08/01/18 1639  WBC 6.7  NEUTROABS 4.6  HGB 15.3*  HCT 46.7*  MCV 89.6  PLT 166   Basic Metabolic Panel: Recent Labs  Lab 08/01/18 1438 08/01/18 1639 08/02/18 0522  NA 144  --   141  K 2.6*  --  3.7  CL 113*  --  109  CO2 21*  --  26  GLUCOSE 82  --  102*  BUN 18  --  18  CREATININE 0.53  --  0.57  CALCIUM 7.7*  --  8.8*  MG  --  1.6* 2.1   GFR: Estimated Creatinine Clearance: 39.1 mL/min (by C-G formula based on SCr of 0.57 mg/dL). Liver Function Tests: Recent Labs  Lab 08/01/18 1438  AST 34  ALT 26  ALKPHOS 35*  BILITOT 0.8  PROT 5.8*  ALBUMIN 3.3*   No results for input(s): LIPASE, AMYLASE in the last 168 hours. No results for input(s): AMMONIA in the last 168 hours. Coagulation Profile: No results for input(s): INR, PROTIME in the last 168 hours. Cardiac Enzymes: No results for input(s): CKTOTAL, CKMB, CKMBINDEX, TROPONINI in the last 168 hours. BNP (last 3 results) No results for input(s): PROBNP in the last 8760 hours. HbA1C: No results for input(s): HGBA1C in the last 72 hours. CBG: No results for input(s): GLUCAP in the last 168 hours. Lipid Profile: No results for input(s): CHOL, HDL, LDLCALC, TRIG, CHOLHDL, LDLDIRECT in the last 72 hours. Thyroid Function Tests: No results for input(s): TSH, T4TOTAL, FREET4, T3FREE, THYROIDAB in the last 72 hours. Anemia Panel: No results for input(s): VITAMINB12, FOLATE, FERRITIN, TIBC, IRON, RETICCTPCT in the last 72 hours. Sepsis Labs: No results for input(s): PROCALCITON, LATICACIDVEN in the last 168 hours.  Recent Results (from the past 240 hour(s))  Urine culture     Status: None   Collection Time: 08/01/18  2:38 PM  Result Value Ref Range Status   Specimen Description   Final    URINE, RANDOM Performed at [facility], [address], [place], [place] [address]    Special Requests   Final    NONE Performed at [facility], [address] [place], [place] [address]    Culture   Final    NO GROWTH Performed at [facility], [address] [place], [place] [address]    Report Status 08/02/2018 FINAL  Final         Radiology  Studies: No results found.      Scheduled Meds: . acetaminophen  1,000 mg Oral TID  . amLODipine  2.5 mg Oral Daily  . aspirin EC  81 mg Oral Daily  . baclofen  30 mg Oral BID   And  . baclofen  20 mg Oral Q1500  . calcium-vitamin D  2 tablet Oral BID  . clopidogrel  75 mg Oral QPC breakfast  . enoxaparin (LOVENOX) injection  40 mg Subcutaneous Q24H  . famotidine  20 mg Oral Daily  . gabapentin  400 mg Oral BID   And  . gabapentin  800 mg Oral Q1500  . isosorbide mononitrate  60 mg Oral Daily  . levothyroxine  75 mcg Oral Q0600  . metoprolol tartrate  100 mg Oral BID  . mirabegron ER  25 mg Oral Daily  . multivitamin with minerals  1 tablet Oral Daily  . simvastatin  20 mg Oral QPM  .  traZODone  75 mg Oral QHS  . triamterene-hydrochlorothiazide  1 capsule Oral Daily  . vitamin B-12  1,000 mcg Oral Daily   Continuous Infusions: . sodium chloride 500 mL (08/02/18 0842)  . cefTRIAXone (ROCEPHIN)  IV 1 g (08/02/18 1511)     LOS: 0 days    Time spent: 35 minutes    Ramiro Harvest, MD [facility]  If 7PM-7AM, please contact night-coverage [email or web address] Password [facility] 08/02/2018, 6:56 PM

## 2018-08-02 NOTE — Care Management Obs Status (Signed)
MEDICARE OBSERVATION STATUS NOTIFICATION   Patient Details  Name: Kimberly Bass MRN: [id number] Date of Birth: 1926-04-08   Medicare Observation Status Notification Given:  Yes    MahabirOlegario Messier, RN 08/02/2018, 12:30 PM

## 2018-08-02 NOTE — Evaluation (Signed)
Occupational Therapy Evaluation Patient Details Name: Kimberly Bass MRN: [id number] DOB: 28-Mar-1926 Today's Date: 08/02/2018    History of Present Illness Kimberly Bass is a 85 y.o. female with medical history significant of coronary artery disease, hypertension presents with confusion. Pt diagnosed with UTI 1 week ago.   Clinical Impression   This 85 year old female was admitted for the above. She lives in independent living and uses a rollator. Pt needs mostly min guard assistance at this time. Will follow in acute setting with supervision level goals.     Follow Up Recommendations  Home health OT    Equipment Recommendations  (? tub DME; defer to [facility])    Recommendations for Other Services       Precautions / Restrictions Precautions Precautions: Fall Restrictions Weight Bearing Restrictions: No      Mobility Bed Mobility               General bed mobility comments: pt up in recliner and returned to recliner  Transfers Overall transfer level: Needs assistance Equipment used: Rolling walker (2 wheeled) Transfers: Sit to/from [employer] Sit to Stand: Min guard Stand pivot transfers: Min guard       General transfer comment: for safety    Balance Overall balance assessment: Needs assistance   Sitting balance-Leahy Scale: Fair       Standing balance-Leahy Scale: Poor                             ADL either performed or assessed with clinical judgement   ADL Overall ADL's : Needs assistance/impaired     Grooming: Oral care;Wash/dry hands;Min guard;Standing   Upper Body Bathing: Set up   Lower Body Bathing: Min guard   Upper Body Dressing : Minimal assistance(lines)   Lower Body Dressing: Min guard;Sit to/from stand   Toilet Transfer: Min guard;Ambulation;BSC;RW   Toileting- Architect and Hygiene: Min guard;Sit to/from stand         General ADL Comments: ambulated to bathroom; stood at  sink x 3 and used commode x 2.  Pt initially unsteady walking to bathroom, but improved on the way back to the chair     Vision         Perception     Praxis      Pertinent Vitals/Pain Pain Assessment: No/denies pain     Hand Dominance Right   Extremity/Trunk Assessment Upper Extremity Assessment Upper Extremity Assessment: Overall WFL for tasks assessed   Lower Extremity Assessment Lower Extremity Assessment: Overall WFL for tasks assessed;Generalized weakness       Communication Communication Communication: No difficulties   Cognition Arousal/Alertness: Awake/alert Behavior During Therapy: WFL for tasks assessed/performed Overall Cognitive Status: Within Functional Limits for tasks assessed                                 General Comments: mostly wfls; started to walk back to commode without RW   General Comments  2/4 dyspnea with activity. Encouraged rest breaks    Exercises     Shoulder Instructions      Home Living Family/patient expects to be discharged to:: Private residence Living Arrangements: Alone Available Help at Discharge: Family Type of Home: Independent living facility(senior apartments) Home Access: Elevator     Home Layout: One level     Bathroom Shower/Tub: Chief Strategy Officer: Handicapped height  Home Equipment: Grab bars - toilet;Grab bars - tub/shower;Walker - 4 wheels          Prior Functioning/Environment Level of Independence: Independent with assistive device(s)        Comments: uses rollator all the time now.  Has a life alert necklace        OT Problem List: Decreased strength;Decreased activity tolerance;Impaired balance (sitting and/or standing);Decreased safety awareness      OT Treatment/Interventions: Self-care/ADL training;Energy conservation;DME and/or AE instruction;Therapeutic activities;Patient/family education;Balance training    OT Goals(Current goals can be found in  the care plan section) Acute Rehab OT Goals Patient Stated Goal: return to independent living; return to bingo OT Goal Formulation: With patient Time For Goal Achievement: 08/16/18 Potential to Achieve Goals: Good ADL Goals Pt Will Transfer to Toilet: with supervision;ambulating;bedside commode Additional ADL Goal #1: Pt will gather clothes and complete adl at supervision level  OT Frequency: Min 2X/week   Barriers to D/C:            Co-evaluation              AM-PAC OT "6 Clicks" Daily Activity     Outcome Measure Help from another person eating meals?: None Help from another person taking care of personal grooming?: A Little Help from another person toileting, which includes using toliet, bedpan, or urinal?: A Little Help from another person bathing (including washing, rinsing, drying)?: A Little Help from another person to put on and taking off regular upper body clothing?: A Little Help from another person to put on and taking off regular lower body clothing?: A Little 6 Click Score: 19   End of Session    Activity Tolerance: Patient tolerated treatment well Patient left: in chair;with call bell/phone within reach;with chair alarm set  OT Visit Diagnosis: Unsteadiness on feet (R26.81)                Time: 5449-2010 OT Time Calculation (min): 31 min Charges:  OT General Charges $OT Visit: 1 Visit OT Evaluation $OT Eval Low Complexity: 1 Low OT Treatments $Self Care/Home Management : 8-22 mins  Marica Otter, OTR/L Acute Rehabilitation Services [phone] [facility] pager [phone] office 08/02/2018  Kimberly Bass 08/02/2018, 2:50 PM

## 2018-08-02 NOTE — Evaluation (Signed)
Physical Therapy Evaluation Patient Details Name: Kimberly Bass MRN: [id number] DOB: Feb 01, 1926 Today's Date: 08/02/2018   History of Present Illness  Kimberly Bass is a 85 y.o. female with medical history significant of coronary artery disease, hypertension presents with confusion. Pt diagnosed with UTI 1 week ago.  Clinical Impression  Pt admitted with above diagnosis. Pt currently with functional limitations due to the deficits listed below (see PT Problem List). Pt will benefit from skilled PT to increase their independence and safety with mobility to allow discharge to the venue listed below.  Recommend returning to her senior living apartment with HHPT.       Follow Up Recommendations Home health PT    Equipment Recommendations  None recommended by PT    Recommendations for Other Services       Precautions / Restrictions Precautions Precautions: Fall Restrictions Weight Bearing Restrictions: No      Mobility  Bed Mobility               General bed mobility comments: pt up in recliner and returned to recliner  Transfers Overall transfer level: Needs assistance Equipment used: 4-wheeled walker Transfers: Sit to/from [employer] Transfers Sit to Stand: Min guard Stand pivot transfers: Min guard       General transfer comment: min/guard for safety/steadying from recliner and BSC  Ambulation/Gait   Gait Distance (Feet): 100 Feet Assistive device: 4-wheeled walker Gait Pattern/deviations: Decreased step length - right;Decreased step length - left Gait velocity: decreased   General Gait Details: decreased speed compared to normal per son. No buckeling noted, but generalized weakness  Stairs            Wheelchair Mobility    Modified Rankin (Stroke Patients Only)       Balance Overall balance assessment: Needs assistance   Sitting balance-Leahy Scale: Fair       Standing balance-Leahy Scale: Poor                                Pertinent Vitals/Pain Pain Assessment: No/denies pain    Home Living Family/patient expects to be discharged to:: Private residence Living Arrangements: Alone Available Help at Discharge: Family;Available 24 hours/day(son lives nearby) Type of Home: Independent living facility(senior apartments) Home Access: Elevator     Home Layout: One level Home Equipment: Walker - 4 wheels;Grab bars - tub/shower;Grab bars - toilet      Prior Function           Comments: uses rollator all the time now.  Has a life alert necklace     Hand Dominance   Dominant Hand: Right    Extremity/Trunk Assessment   Upper Extremity Assessment Upper Extremity Assessment: Overall WFL for tasks assessed    Lower Extremity Assessment Lower Extremity Assessment: Overall WFL for tasks assessed;Generalized weakness       Communication   Communication: No difficulties  Cognition Arousal/Alertness: Awake/alert Behavior During Therapy: WFL for tasks assessed/performed Overall Cognitive Status: Within Functional Limits for tasks assessed                                 General Comments: Tends to let her son answer questions, although can answer them herself.      General Comments General comments (skin integrity, edema, etc.): Pt with purewick on upon arrival. Removed and transferred to [facility]. Pt seems aware of when she  needs to urinate and prefers BSC over purewick. Nurse informed.    Exercises     Assessment/Plan    PT Assessment Patient needs continued PT services  PT Problem List Decreased strength;Decreased activity tolerance;Decreased balance       PT Treatment Interventions Gait training;Therapeutic exercise;Therapeutic activities;Functional mobility training;Balance training;Patient/family education    PT Goals (Current goals can be found in the Care Plan section)  Acute Rehab PT Goals Patient Stated Goal: return to her senior living apartment PT  Goal Formulation: With patient/family Time For Goal Achievement: 08/09/18 Potential to Achieve Goals: Good    Frequency Min 3X/week   Barriers to discharge Decreased caregiver support lives alone    Co-evaluation               AM-PAC PT "6 Clicks" Mobility  Outcome Measure Help needed turning from your back to your side while in a flat bed without using bedrails?: A Little Help needed moving from lying on your back to sitting on the side of a flat bed without using bedrails?: A Little Help needed moving to and from a bed to a chair (including a wheelchair)?: A Little Help needed standing up from a chair using your arms (e.g., wheelchair or bedside chair)?: A Little Help needed to walk in hospital room?: A Little Help needed climbing 3-5 steps with a railing? : A Little 6 Click Score: 18    End of Session Equipment Utilized During Treatment: Gait belt Activity Tolerance: Patient tolerated treatment well Patient left: in chair;with chair alarm set;with call bell/phone within reach;with family/visitor present Nurse Communication: Mobility status PT Visit Diagnosis: Muscle weakness (generalized) (M62.81);Difficulty in walking, not elsewhere classified (R26.2)    Time: 1117(no charge for time getting rollator)-1155 PT Time Calculation (min) (ACUTE ONLY): 38 min   Charges:   PT Evaluation $PT Eval Low Complexity: 1 Low PT Treatments $Gait Training: 8-22 mins        Cecille Mcclusky L. Katrinka Blazing, [place] Pager [phone] 08/02/2018   Enzo Montgomery 08/02/2018, 12:20 PM

## 2018-08-03 DIAGNOSIS — N390 Urinary tract infection, site not specified: Secondary | ICD-10-CM | POA: Diagnosis not present

## 2018-08-03 DIAGNOSIS — I10 Essential (primary) hypertension: Secondary | ICD-10-CM | POA: Diagnosis not present

## 2018-08-03 DIAGNOSIS — G9341 Metabolic encephalopathy: Secondary | ICD-10-CM | POA: Diagnosis not present

## 2018-08-03 DIAGNOSIS — I2510 Atherosclerotic heart disease of native coronary artery without angina pectoris: Secondary | ICD-10-CM | POA: Diagnosis not present

## 2018-08-03 LAB — BASIC METABOLIC PANEL
Anion gap: 9 (ref 5–15)
BUN: 22 mg/dL (ref 8–23)
CALCIUM: 9.3 mg/dL (ref 8.9–10.3)
CO2: 27 mmol/L (ref 22–32)
Chloride: 102 mmol/L (ref 98–111)
Creatinine, Ser: 0.72 mg/dL (ref 0.44–1.00)
GFR calc Af Amer: >60 mL/min (ref >60)
GFR calc non Af Amer: >60 mL/min (ref >60)
Glucose, Bld: 111 mg/dL — ABNORMAL HIGH (ref 70–99)
Potassium: 3.5 mmol/L (ref 3.5–5.1)
SODIUM: 138 mmol/L (ref 135–145)

## 2018-08-03 LAB — MAGNESIUM: Magnesium: 2 mg/dL (ref 1.7–2.4)

## 2018-08-03 MED ORDER — ISOSORBIDE MONONITRATE ER 30 MG PO TB24: 30.0000 mg | ORAL_TABLET | Freq: Every day | ORAL | 0 refills | Status: DC

## 2018-08-03 MED ORDER — ISOSORBIDE MONONITRATE ER 30 MG PO TB24: 30.0000 mg | ORAL_TABLET | Freq: Every day | ORAL | Status: DC

## 2018-08-03 MED ORDER — METOPROLOL TARTRATE 50 MG PO TABS: 50.0000 mg | ORAL_TABLET | Freq: Two times a day (BID) | ORAL | Status: DC

## 2018-08-03 MED ORDER — POTASSIUM CHLORIDE CRYS ER 20 MEQ PO TBCR
40.0000 meq | EXTENDED_RELEASE_TABLET | Freq: Once | ORAL | Status: AC
  Administered 2018-08-03: 40 meq via ORAL
  Filled 2018-08-03: qty 2

## 2018-08-03 MED ORDER — METOPROLOL TARTRATE 50 MG PO TABS: 50.0000 mg | ORAL_TABLET | Freq: Two times a day (BID) | ORAL | 0 refills | Status: DC

## 2018-08-03 MED ORDER — METOPROLOL TARTRATE 50 MG PO TABS: 100.0000 mg | ORAL_TABLET | Freq: Two times a day (BID) | ORAL | Status: DC

## 2018-08-03 NOTE — Progress Notes (Signed)
Occupational Therapy Treatment Patient Details Name: Kimberly Bass MRN: [id number] DOB: 03/07/1926 Today's Date: 08/03/2018    History of present illness Kimberly Bass is a 85 y.o. female with medical history significant of coronary artery disease, hypertension presents with confusion. Pt diagnosed with UTI 1 week ago.   OT comments  Pt much steadier today with good safety awareness.  Follow Up Recommendations  Home health OT    Equipment Recommendations  [facility]    Recommendations for Other Services      Precautions / Restrictions Precautions Precautions: Fall Restrictions Weight Bearing Restrictions: No       Mobility Bed Mobility               General bed mobility comments: pt up in recliner and returned to recliner  Transfers   Equipment used: Rolling walker (2 wheeled)   Sit to Stand: Supervision         General transfer comment: self cued for hand placement    Balance                                           ADL either performed or assessed with clinical judgement   ADL       Grooming: Wash/dry hands;Brushing hair;Standing;Supervision/safety                   Toilet Transfer: Supervision/safety;Ambulation;BSC;RW   Toileting- Clothing Manipulation and Hygiene: Supervision/safety;Sit to/from stand         General ADL Comments: ambulated to bathroom and performed above tasks. [place]adier today     Vision       Perception     Praxis      Cognition Arousal/Alertness: Awake/alert Behavior During Therapy: WFL for tasks assessed/performed Overall Cognitive Status: Within Functional Limits for tasks assessed                                          Exercises     Shoulder Instructions       General Comments      Pertinent Vitals/ Pain       Pain Assessment: No/denies pain  Home Living                                          Prior  Functioning/Environment              Frequency  Min 2X/week        Progress Toward Goals  OT Goals(current goals can now be found in the care plan section)  Progress towards OT goals: Progressing toward goals     Plan      Co-evaluation                 AM-PAC OT "6 Clicks" Daily Activity     Outcome Measure   Help from another person eating meals?: None Help from another person taking care of personal grooming?: A Little Help from another person toileting, which includes using toliet, bedpan, or urinal?: A Little Help from another person bathing (including washing, rinsing, drying)?: A Little Help from another person to put on and taking off regular upper body clothing?: A  Little Help from another person to put on and taking off regular lower body clothing?: A Little 6 Click Score: 19    End of Session    OT Visit Diagnosis: Unsteadiness on feet (R26.81)   Activity Tolerance Patient tolerated treatment well   Patient Left in chair;with call bell/phone within reach;with chair alarm set;with family/visitor present   Nurse Communication          Time: 0867-6195 OT Time Calculation (min): 22 min  Charges: OT Treatments $Self Care/Home Management : 8-22 mins  Marica Otter, OTR/L Acute Rehabilitation Services [phone] [facility] pager [phone] office 08/03/2018   Chaska Hagger 08/03/2018, 11:36 AM

## 2018-08-03 NOTE — Discharge Summary (Addendum)
Physician Discharge Summary  Kimberly Bass ZOX:[id number] DOB: 09/23/1925 DOA: 08/01/2018  PCP: Merri BrunettePharr, Walter, MD  Admit date: 08/01/2018 Discharge date: 08/03/2018  Time spent: 55 minutes  Recommendations for Outpatient Follow-up:  1. Follow-up with Merri BrunettePharr, Walter, MD in 1 to 2 weeks.  On follow-up patient's blood pressure need to be reassessed as patient's Dyazide was discontinued, Lopressor and Imdur dose were decreased to have home dosing patient's Norvasc discontinued.  Patient will need a basic metabolic profile done as well as a magnesium level to follow-up on electrolytes and renal function. 2. Follow-up with cardiology, Dr. Elease HashimotoNahser in 2 to 3 weeks.   Discharge Diagnoses:  Principal Problem:   AMS (altered mental status) Active Problems:   CAD (coronary artery disease)   Urinary tract infection with hematuria   Hypothyroidism   Essential hypertension   Hypokalemia   Pyuria   Altered mental status   Acute metabolic encephalopathy   Hypomagnesemia   Discharge Condition: Stable and improved  Diet recommendation: Heart healthy  Filed Weights   08/01/18 1355 08/01/18 2117  Weight: 67.6 kg 67.6 kg    History of present illness:  Per Dr Thayer DallasJohnson-Pitts Kimberly BrownieJeanneane H Byrnes is a 85 y.o. female with medical history significant of coronary artery disease, hypertension presented with confusion.  Patient was treated for UTI by her PCP about a week ago.  She finished her course of Cipro on Saturday.  Her son noted she had been more confused than usual.  She is a mix of her days and nights and has had slow thinking.  Patient lives at a independent living facility.  Patient's daughter-in-law noticed that she had missed the last 2 days of her medications when they reviewed her medication box.  Patient denied any chest pain shortness of breath nausea vomiting but admits to some dysuria.  Her son stated she gets a urinary tract infection about twice a year and does get confused with it.    ED Course: In the ED, patient was found to have a potassium of 2.6 and a magnesium of 1.6.  She did have some white cells and trace leukoesterase on her urinalysis.  She was afebrile here.  Did not have any leukocytosis.  She was also hemodynamically stable.  Patient was given potassium and magnesium supplementation in the ED as well as Rocephin.  Hospital Course:  1 acute metabolic encephalopathy likely secondary to urinary tract infection Patient was admitted with worsening confusion and per son also dysuria which has been ongoing even prior to her initiation and course of ciprofloxacin.  It was felt patient's acute metabolic encephalopathy was likely secondary to UTI.  Patient was admitted placed on IV fluids, empiric IV Rocephin and urine cultures obtained.  Urine cultures however were negative.  Patient was on antibiotics prior to hospitalization.  Patient improved clinically during the hospitalization.  Patient's mentation improved and was close to baseline by day of discharge.  Patient received 3 doses of IV Rocephin during this hospitalization and needs no further antibiotics on discharge.  Outpatient follow-up with PCP.  2.  Hypothyroidism Continued on home dose Synthroid.  3.  Hypokalemia/hypomagnesemia Patient noted to be hypokalemic and hypomagnesemic on admission.  Patient also noted to be on Dyazide prior to admission.  Patient's electrolytes were repleted.  Dyazide was discontinued on discharge as patient noted to have borderline blood pressure during the hospitalization.  Outpatient follow-up with PCP.    4.  Hypertension Patient initially was maintained on home regimen of Dyazide, Norvasc,imdur, Lopressor however  during the hospitalization patient was noted to have borderline blood pressure and as such patient's antihypertensive medications were discontinued.  Patient be discharged home on half home dose of Lopressor, half home dose of Imdur.  Outpatient follow-up with PCP and  cardiology.   5.  Coronary artery disease Stable.  Patient initially maintained on her home regimen of Norvasc,, lopressor, imdur, Dyazide, Plavix and aspirin.  Patient however noted to be up borderline hypotension and as such Norvasc was discontinued.  Patient's home regimen Lopressor dose has been decreased to 50 mg twice daily.  Patient's Dyazide has been discontinued.  Outpatient follow-up with PCP and cardiology.  Follow.   Procedures:  None  Consultations:  None  Discharge Exam: Vitals:   08/03/18 0907 08/03/18 1354  BP: 125/62 126/71  Pulse: 72 90  Resp:  18  Temp:  98.2 F (36.8 C)  SpO2: 95% 93%    General: NAD Cardiovascular: RRR Respiratory: CTAB  Discharge Instructions   Discharge Instructions    Diet - low sodium heart healthy   Complete by:  As directed    Increase activity slowly   Complete by:  As directed      Allergies as of 08/03/2018      Reactions   Codeine Nausea Only   Meperidine Hcl Nausea Only   Morphine Nausea Only   Penicillins Nausea Only   Has patient had a PCN reaction causing immediate rash, facial/tongue/throat swelling, SOB or lightheadedness with hypotension: Unk Has patient had a PCN reaction causing severe rash involving mucus membranes or skin necrosis: Unk Has patient had a PCN reaction that required hospitalization: Unk Has patient had a PCN reaction occurring within the last 10 years: Unk If all of the above answers are "NO", then may proceed with Cephalosporin use.      Medication List    STOP taking these medications   amLODipine 2.5 MG tablet Commonly known as:  NORVASC   triamterene-hydrochlorothiazide 37.5-25 MG capsule Commonly known as:  DYAZIDE     TAKE these medications   aspirin 81 MG tablet Take 81 mg by mouth daily.   baclofen 20 MG tablet Commonly known as:  LIORESAL Take 20-30 mg by mouth See admin instructions. 30 mg every morning, 20 mg every afternoon, 30 mg every evening   calcium-vitamin D  250-100 MG-UNIT tablet Take 2 tablets by mouth 2 (two) times daily.   clopidogrel 75 MG tablet Commonly known as:  PLAVIX TAKE 1 TABLET BY MOUTH  DAILY What changed:  when to take this   gabapentin 400 MG capsule Commonly known as:  NEURONTIN TAKE 1 CAPSULE (400 MG TOTAL) BY MOUTH 4 (FOUR) TIMES DAILY. What changed:    how much to take  when to take this  additional instructions   isosorbide mononitrate 30 MG 24 hr tablet Commonly known as:  IMDUR Take 1 tablet (30 mg total) by mouth daily. Start taking on:  August 05, 2018 What changed:    medication strength  how much to take  These instructions start on August 05, 2018. If you are unsure what to do until then, ask your doctor or other care provider.   levothyroxine 75 MCG tablet Commonly known as:  SYNTHROID, LEVOTHROID Take 75 mcg by mouth daily.   metoprolol tartrate 50 MG tablet Commonly known as:  LOPRESSOR Take 1 tablet (50 mg total) by mouth 2 (two) times daily. Start taking on:  August 05, 2018 What changed:    medication strength  how much to  take  These instructions start on August 05, 2018. If you are unsure what to do until then, ask your doctor or other care provider.   mirabegron ER 50 MG Tb24 tablet Commonly known as:  MYRBETRIQ Take 25 mg by mouth daily.   multivitamin tablet Take 1 tablet by mouth daily.   NITROSTAT 0.4 MG SL tablet Generic drug:  nitroGLYCERIN DISSOLVE 1 TAB UNDER THE  TONGUE EVERY 5 MIN AS  NEEDED FOR CHEST PAIN. MAX  3 TOTAL DOSES/15MIN. 911 IF NO RELIEF   ranitidine 150 MG tablet Commonly known as:  ZANTAC Take 150 mg by mouth 2 (two) times daily.   simvastatin 20 MG tablet Commonly known as:  ZOCOR Take 20 mg by mouth every evening.   traZODone 50 MG tablet Commonly known as:  DESYREL Take 75 mg by mouth at bedtime.   TYLENOL ARTHRITIS PAIN 650 MG CR tablet Generic drug:  acetaminophen Take 1,300 mg by mouth 3 (three) times daily.   vitamin B-12  1000 MCG tablet Commonly known as:  CYANOCOBALAMIN Take 1,000 mcg by mouth daily.      Allergies  Allergen Reactions  . Codeine Nausea Only  . Meperidine Hcl Nausea Only  . Morphine Nausea Only  . Penicillins Nausea Only    Has patient had a PCN reaction causing immediate rash, facial/tongue/throat swelling, SOB or lightheadedness with hypotension: Unk Has patient had a PCN reaction causing severe rash involving mucus membranes or skin necrosis: Unk Has patient had a PCN reaction that required hospitalization: Unk Has patient had a PCN reaction occurring within the last 10 years: Unk If all of the above answers are "NO", then may proceed with Cephalosporin use.    Follow-up Information    Merri BrunettePharr, Walter, MD. Schedule an appointment as soon as possible for a visit in 1 week(s).   Specialty:  Internal Medicine Why:  f/u in 1-2 weeks. Contact information: [address] [place] [address] [phone]        Nahser, Deloris PingPhilip J, MD. Schedule an appointment as soon as possible for a visit in 3 week(s).   Specialty:  Cardiology Why:  f/u in 2-3 weeks. Contact information: [address] [place] [address] [phone]            The results of significant diagnostics from this hospitalization (including imaging, microbiology, ancillary and laboratory) are listed below for reference.    Significant Diagnostic Studies: No results found.  Microbiology: Recent Results (from the past 240 hour(s))  Urine culture     Status: None   Collection Time: 08/01/18  2:38 PM  Result Value Ref Range Status   Specimen Description   Final    URINE, RANDOM Performed at [facility], [address] [place], [place] [address]    Special Requests   Final    NONE Performed at [facility], [address] [place], [place] [address]    Culture   Final    NO GROWTH Performed at [facility], [address] [place], [place] [address]    Report Status 08/02/2018 FINAL  Final     Labs: Basic Metabolic Panel: Recent Labs  Lab 08/01/18 1438 08/01/18 1639 08/02/18 0522 08/03/18 0927  NA 144  --  141 138  K 2.6*  --  3.7 3.5  CL 113*  --  109 102  CO2 21*  --  26 27  GLUCOSE 82  --  102* 111*  BUN 18  --  18 22  CREATININE 0.53  --  0.57 0.72  CALCIUM 7.7*  --  8.8* 9.3  MG  --  1.6* 2.1 2.0   Liver Function Tests: Recent Labs  Lab 08/01/18 1438  AST 34  ALT 26  ALKPHOS 35*  BILITOT 0.8  PROT 5.8*  ALBUMIN 3.3*   No results for input(s): LIPASE, AMYLASE in the last 168 hours. No results for input(s): AMMONIA in the last 168 hours. CBC: Recent Labs  Lab 08/01/18 1639  WBC 6.7  NEUTROABS 4.6  HGB 15.3*  HCT 46.7*  MCV 89.6  PLT 166   Cardiac Enzymes: No results for input(s): CKTOTAL, CKMB, CKMBINDEX, TROPONINI in the last 168 hours. BNP: BNP (last 3 results) No results for input(s): BNP in the last 8760 hours.  ProBNP (last 3 results) No results for input(s): PROBNP in the last 8760 hours.  CBG: No results for input(s): GLUCAP in the last 168 hours.     Signed:  Ramiro Harvest MD.  [facility] 08/03/2018, 3:19 PM

## 2018-08-03 NOTE — Progress Notes (Signed)
Physical Therapy Treatment Patient Details Name: Kimberly Bass Soderberg MRN: [id number] DOB: 08/07/1925 Today's Date: 08/03/2018    History of Present Illness Kimberly Bass Berrocal is a 85 y.o. female with medical history significant of coronary artery disease, hypertension presents with confusion. Pt diagnosed with UTI 1 week ago.    PT Comments    Pt very cooperative and demonstrating improved stability with ambulation and increased activity tolerance.  Pt hopeful for dc home this date.   Follow Up Recommendations  Home health PT     Equipment Recommendations  None recommended by PT    Recommendations for Other Services       Precautions / Restrictions Precautions Precautions: Fall Restrictions Weight Bearing Restrictions: No    Mobility  Bed Mobility               General bed mobility comments: pt up in recliner and returned to recliner  Transfers Overall transfer level: Needs assistance Equipment used: 4-wheeled walker Transfers: Sit to/from Stand Sit to Stand: Supervision         General transfer comment: cued for hand placement  Ambulation/Gait Ambulation/Gait assistance: Min guard;Supervision Gait Distance (Feet): 240 Feet Assistive device: 4-wheeled walker Gait Pattern/deviations: Decreased step length - right;Decreased step length - left;Shuffle;Trunk flexed Gait velocity: decreased   General Gait Details: decreased speed compared to normal per son. No buckeling noted, but generalized weakness   Stairs             Wheelchair Mobility    Modified Rankin (Stroke Patients Only)       Balance Overall balance assessment: Needs assistance Sitting-balance support: No upper extremity supported;Feet supported Sitting balance-Leahy Scale: Good     Standing balance support: No upper extremity supported Standing balance-Leahy Scale: Fair                              Cognition Arousal/Alertness: Awake/alert Behavior During  Therapy: WFL for tasks assessed/performed Overall Cognitive Status: Within Functional Limits for tasks assessed                                        Exercises      General Comments General comments (skin integrity, edema, etc.): 1/4 dyspnea      Pertinent Vitals/Pain Pain Assessment: No/denies pain    Home Living                      Prior Function            PT Goals (current goals can now be found in the care plan section) Acute Rehab PT Goals PT Goal Formulation: With patient Time For Goal Achievement: 08/09/18 Potential to Achieve Goals: Good Progress towards PT goals: Progressing toward goals    Frequency    Min 3X/week      PT Plan Current plan remains appropriate    Co-evaluation              AM-PAC PT "6 Clicks" Mobility   Outcome Measure  Help needed turning from your back to your side while in a flat bed without using bedrails?: A Little Help needed moving from lying on your back to sitting on the side of a flat bed without using bedrails?: A Little Help needed moving to and from a bed to a chair (including a wheelchair)?: A Little Help needed  standing up from a chair using your arms (e.g., wheelchair or bedside chair)?: A Little Help needed to walk in hospital room?: A Little Help needed climbing 3-5 steps with a railing? : A Little 6 Click Score: 18    End of Session Equipment Utilized During Treatment: Gait belt Activity Tolerance: Patient tolerated treatment well Patient left: in chair;with chair alarm set;with call bell/phone within reach;with family/visitor present Nurse Communication: Mobility status PT Visit Diagnosis: Muscle weakness (generalized) (M62.81);Difficulty in walking, not elsewhere classified (R26.2)     Time: 1027-2536 PT Time Calculation (min) (ACUTE ONLY): 17 min  Charges:  $Gait Training: 8-22 mins                     Mauro Kaufmann PT Acute Rehabilitation Services Pager  [phone] Office [phone]    Pj Zehner 08/03/2018, 12:23 PM

## 2018-08-03 NOTE — Progress Notes (Signed)
Pt alert and oriented, d/c instructions and prescriptions given to the son.  Pt was d/cd back to [facility].

## 2018-08-03 NOTE — Progress Notes (Signed)
Because BP was 107/61 Pulse 68, NP Schoor  ok'd to hold metoprolol at 2200 and recheck  after midnight.  BP was rechecked at 0200.

## 2018-08-03 NOTE — Progress Notes (Signed)
Called MD concerning BP of 125/62.  Orders given. Will continue to monitor.

## 2018-08-04 DIAGNOSIS — I2510 Atherosclerotic heart disease of native coronary artery without angina pectoris: Secondary | ICD-10-CM | POA: Diagnosis not present

## 2018-08-04 DIAGNOSIS — I119 Hypertensive heart disease without heart failure: Secondary | ICD-10-CM | POA: Diagnosis not present

## 2018-08-04 DIAGNOSIS — M47816 Spondylosis without myelopathy or radiculopathy, lumbar region: Secondary | ICD-10-CM | POA: Diagnosis not present

## 2018-08-04 DIAGNOSIS — N390 Urinary tract infection, site not specified: Secondary | ICD-10-CM | POA: Diagnosis not present

## 2018-08-04 DIAGNOSIS — M150 Primary generalized (osteo)arthritis: Secondary | ICD-10-CM | POA: Diagnosis not present

## 2018-08-08 ENCOUNTER — Other Ambulatory Visit: Payer: Self-pay | Admitting: Cardiovascular Disease

## 2018-08-08 DIAGNOSIS — I2510 Atherosclerotic heart disease of native coronary artery without angina pectoris: Secondary | ICD-10-CM | POA: Diagnosis not present

## 2018-08-08 DIAGNOSIS — M47816 Spondylosis without myelopathy or radiculopathy, lumbar region: Secondary | ICD-10-CM | POA: Diagnosis not present

## 2018-08-08 DIAGNOSIS — I119 Hypertensive heart disease without heart failure: Secondary | ICD-10-CM | POA: Diagnosis not present

## 2018-08-08 DIAGNOSIS — N390 Urinary tract infection, site not specified: Secondary | ICD-10-CM | POA: Diagnosis not present

## 2018-08-08 DIAGNOSIS — M150 Primary generalized (osteo)arthritis: Secondary | ICD-10-CM | POA: Diagnosis not present

## 2018-08-08 NOTE — Telephone Encounter (Signed)
Kimberly Bass, pt pharmacy is requesting medication that has never been prescribed by Dr. Elease Hashimoto. Please advise. Thank you

## 2018-08-10 DIAGNOSIS — M150 Primary generalized (osteo)arthritis: Secondary | ICD-10-CM | POA: Diagnosis not present

## 2018-08-10 DIAGNOSIS — I119 Hypertensive heart disease without heart failure: Secondary | ICD-10-CM | POA: Diagnosis not present

## 2018-08-10 DIAGNOSIS — I2510 Atherosclerotic heart disease of native coronary artery without angina pectoris: Secondary | ICD-10-CM | POA: Diagnosis not present

## 2018-08-10 DIAGNOSIS — N390 Urinary tract infection, site not specified: Secondary | ICD-10-CM | POA: Diagnosis not present

## 2018-08-10 DIAGNOSIS — M47816 Spondylosis without myelopathy or radiculopathy, lumbar region: Secondary | ICD-10-CM | POA: Diagnosis not present

## 2018-08-14 DIAGNOSIS — N390 Urinary tract infection, site not specified: Secondary | ICD-10-CM | POA: Diagnosis not present

## 2018-08-14 DIAGNOSIS — I119 Hypertensive heart disease without heart failure: Secondary | ICD-10-CM | POA: Diagnosis not present

## 2018-08-14 DIAGNOSIS — I2510 Atherosclerotic heart disease of native coronary artery without angina pectoris: Secondary | ICD-10-CM | POA: Diagnosis not present

## 2018-08-14 DIAGNOSIS — M150 Primary generalized (osteo)arthritis: Secondary | ICD-10-CM | POA: Diagnosis not present

## 2018-08-14 DIAGNOSIS — M47816 Spondylosis without myelopathy or radiculopathy, lumbar region: Secondary | ICD-10-CM | POA: Diagnosis not present

## 2018-08-17 DIAGNOSIS — M47816 Spondylosis without myelopathy or radiculopathy, lumbar region: Secondary | ICD-10-CM | POA: Diagnosis not present

## 2018-08-17 DIAGNOSIS — N390 Urinary tract infection, site not specified: Secondary | ICD-10-CM | POA: Diagnosis not present

## 2018-08-17 DIAGNOSIS — M150 Primary generalized (osteo)arthritis: Secondary | ICD-10-CM | POA: Diagnosis not present

## 2018-08-17 DIAGNOSIS — N150 Balkan nephropathy: Secondary | ICD-10-CM | POA: Diagnosis not present

## 2018-08-17 DIAGNOSIS — I2510 Atherosclerotic heart disease of native coronary artery without angina pectoris: Secondary | ICD-10-CM | POA: Diagnosis not present

## 2018-08-17 DIAGNOSIS — I119 Hypertensive heart disease without heart failure: Secondary | ICD-10-CM | POA: Diagnosis not present

## 2018-08-21 DIAGNOSIS — M47816 Spondylosis without myelopathy or radiculopathy, lumbar region: Secondary | ICD-10-CM | POA: Diagnosis not present

## 2018-08-21 DIAGNOSIS — N390 Urinary tract infection, site not specified: Secondary | ICD-10-CM | POA: Diagnosis not present

## 2018-08-21 DIAGNOSIS — I119 Hypertensive heart disease without heart failure: Secondary | ICD-10-CM | POA: Diagnosis not present

## 2018-08-21 DIAGNOSIS — I2510 Atherosclerotic heart disease of native coronary artery without angina pectoris: Secondary | ICD-10-CM | POA: Diagnosis not present

## 2018-08-21 DIAGNOSIS — M150 Primary generalized (osteo)arthritis: Secondary | ICD-10-CM | POA: Diagnosis not present

## 2018-08-24 DIAGNOSIS — M47816 Spondylosis without myelopathy or radiculopathy, lumbar region: Secondary | ICD-10-CM | POA: Diagnosis not present

## 2018-08-24 DIAGNOSIS — N390 Urinary tract infection, site not specified: Secondary | ICD-10-CM | POA: Diagnosis not present

## 2018-08-24 DIAGNOSIS — I119 Hypertensive heart disease without heart failure: Secondary | ICD-10-CM | POA: Diagnosis not present

## 2018-08-24 DIAGNOSIS — I2510 Atherosclerotic heart disease of native coronary artery without angina pectoris: Secondary | ICD-10-CM | POA: Diagnosis not present

## 2018-08-24 DIAGNOSIS — M150 Primary generalized (osteo)arthritis: Secondary | ICD-10-CM | POA: Diagnosis not present

## 2018-08-28 ENCOUNTER — Emergency Department (HOSPITAL_COMMUNITY): Payer: Medicare Other

## 2018-08-28 ENCOUNTER — Inpatient Hospital Stay (HOSPITAL_COMMUNITY)
Admission: EM | Admit: 2018-08-28 | Discharge: 2018-09-01 | DRG: 558 | Disposition: A | Discharge status: Skilled Nursing Facility | Payer: Medicare Other | Attending: Internal Medicine | Admitting: Internal Medicine

## 2018-08-28 ENCOUNTER — Other Ambulatory Visit: Payer: Self-pay

## 2018-08-28 ENCOUNTER — Encounter (HOSPITAL_COMMUNITY): Payer: Self-pay

## 2018-08-28 DIAGNOSIS — Z7401 Bed confinement status: Secondary | ICD-10-CM | POA: Diagnosis not present

## 2018-08-28 DIAGNOSIS — Z823 Family history of stroke: Secondary | ICD-10-CM

## 2018-08-28 DIAGNOSIS — Z79890 Hormone replacement therapy (postmenopausal): Secondary | ICD-10-CM

## 2018-08-28 DIAGNOSIS — M25462 Effusion, left knee: Secondary | ICD-10-CM | POA: Diagnosis present

## 2018-08-28 DIAGNOSIS — Z7902 Long term (current) use of antithrombotics/antiplatelets: Secondary | ICD-10-CM | POA: Diagnosis not present

## 2018-08-28 DIAGNOSIS — E890 Postprocedural hypothyroidism: Secondary | ICD-10-CM | POA: Diagnosis present

## 2018-08-28 DIAGNOSIS — E876 Hypokalemia: Secondary | ICD-10-CM | POA: Diagnosis not present

## 2018-08-28 DIAGNOSIS — I440 Atrioventricular block, first degree: Secondary | ICD-10-CM | POA: Diagnosis not present

## 2018-08-28 DIAGNOSIS — R1311 Dysphagia, oral phase: Secondary | ICD-10-CM | POA: Diagnosis not present

## 2018-08-28 DIAGNOSIS — Z833 Family history of diabetes mellitus: Secondary | ICD-10-CM | POA: Diagnosis not present

## 2018-08-28 DIAGNOSIS — I249 Acute ischemic heart disease, unspecified: Secondary | ICD-10-CM | POA: Diagnosis not present

## 2018-08-28 DIAGNOSIS — Z8249 Family history of ischemic heart disease and other diseases of the circulatory system: Secondary | ICD-10-CM | POA: Diagnosis not present

## 2018-08-28 DIAGNOSIS — R55 Syncope and collapse: Secondary | ICD-10-CM

## 2018-08-28 DIAGNOSIS — E785 Hyperlipidemia, unspecified: Secondary | ICD-10-CM | POA: Diagnosis not present

## 2018-08-28 DIAGNOSIS — R5383 Other fatigue: Secondary | ICD-10-CM

## 2018-08-28 DIAGNOSIS — G2581 Restless legs syndrome: Secondary | ICD-10-CM | POA: Diagnosis not present

## 2018-08-28 DIAGNOSIS — R410 Disorientation, unspecified: Secondary | ICD-10-CM | POA: Diagnosis not present

## 2018-08-28 DIAGNOSIS — Z880 Allergy status to penicillin: Secondary | ICD-10-CM

## 2018-08-28 DIAGNOSIS — S8002XA Contusion of left knee, initial encounter: Secondary | ICD-10-CM | POA: Diagnosis not present

## 2018-08-28 DIAGNOSIS — R112 Nausea with vomiting, unspecified: Secondary | ICD-10-CM | POA: Diagnosis present

## 2018-08-28 DIAGNOSIS — I447 Left bundle-branch block, unspecified: Secondary | ICD-10-CM | POA: Diagnosis not present

## 2018-08-28 DIAGNOSIS — Z8673 Personal history of transient ischemic attack (TIA), and cerebral infarction without residual deficits: Secondary | ICD-10-CM | POA: Diagnosis not present

## 2018-08-28 DIAGNOSIS — I959 Hypotension, unspecified: Secondary | ICD-10-CM | POA: Diagnosis present

## 2018-08-28 DIAGNOSIS — Z961 Presence of intraocular lens: Secondary | ICD-10-CM | POA: Diagnosis present

## 2018-08-28 DIAGNOSIS — I4891 Unspecified atrial fibrillation: Secondary | ICD-10-CM

## 2018-08-28 DIAGNOSIS — Z90710 Acquired absence of both cervix and uterus: Secondary | ICD-10-CM

## 2018-08-28 DIAGNOSIS — Z7982 Long term (current) use of aspirin: Secondary | ICD-10-CM

## 2018-08-28 DIAGNOSIS — R001 Bradycardia, unspecified: Secondary | ICD-10-CM | POA: Diagnosis not present

## 2018-08-28 DIAGNOSIS — E039 Hypothyroidism, unspecified: Secondary | ICD-10-CM | POA: Diagnosis present

## 2018-08-28 DIAGNOSIS — W19XXXA Unspecified fall, initial encounter: Secondary | ICD-10-CM | POA: Diagnosis present

## 2018-08-28 DIAGNOSIS — M6282 Rhabdomyolysis: Principal | ICD-10-CM

## 2018-08-28 DIAGNOSIS — R4182 Altered mental status, unspecified: Secondary | ICD-10-CM

## 2018-08-28 DIAGNOSIS — I499 Cardiac arrhythmia, unspecified: Secondary | ICD-10-CM | POA: Diagnosis not present

## 2018-08-28 DIAGNOSIS — R7989 Other specified abnormal findings of blood chemistry: Secondary | ICD-10-CM

## 2018-08-28 DIAGNOSIS — R2689 Other abnormalities of gait and mobility: Secondary | ICD-10-CM | POA: Diagnosis not present

## 2018-08-28 DIAGNOSIS — M2550 Pain in unspecified joint: Secondary | ICD-10-CM | POA: Diagnosis not present

## 2018-08-28 DIAGNOSIS — S80212A Abrasion, left knee, initial encounter: Secondary | ICD-10-CM | POA: Diagnosis not present

## 2018-08-28 DIAGNOSIS — Z9841 Cataract extraction status, right eye: Secondary | ICD-10-CM

## 2018-08-28 DIAGNOSIS — R52 Pain, unspecified: Secondary | ICD-10-CM

## 2018-08-28 DIAGNOSIS — I952 Hypotension due to drugs: Secondary | ICD-10-CM | POA: Diagnosis not present

## 2018-08-28 DIAGNOSIS — Z79899 Other long term (current) drug therapy: Secondary | ICD-10-CM

## 2018-08-28 DIAGNOSIS — I10 Essential (primary) hypertension: Secondary | ICD-10-CM | POA: Diagnosis not present

## 2018-08-28 DIAGNOSIS — Z888 Allergy status to other drugs, medicaments and biological substances status: Secondary | ICD-10-CM

## 2018-08-28 DIAGNOSIS — I252 Old myocardial infarction: Secondary | ICD-10-CM

## 2018-08-28 DIAGNOSIS — R296 Repeated falls: Secondary | ICD-10-CM | POA: Diagnosis present

## 2018-08-28 DIAGNOSIS — G9341 Metabolic encephalopathy: Secondary | ICD-10-CM | POA: Diagnosis present

## 2018-08-28 DIAGNOSIS — Z66 Do not resuscitate: Secondary | ICD-10-CM | POA: Diagnosis present

## 2018-08-28 DIAGNOSIS — I2510 Atherosclerotic heart disease of native coronary artery without angina pectoris: Secondary | ICD-10-CM | POA: Diagnosis not present

## 2018-08-28 DIAGNOSIS — R2681 Unsteadiness on feet: Secondary | ICD-10-CM | POA: Diagnosis not present

## 2018-08-28 DIAGNOSIS — R404 Transient alteration of awareness: Secondary | ICD-10-CM | POA: Diagnosis not present

## 2018-08-28 DIAGNOSIS — Z9181 History of falling: Secondary | ICD-10-CM

## 2018-08-28 DIAGNOSIS — Z9842 Cataract extraction status, left eye: Secondary | ICD-10-CM

## 2018-08-28 DIAGNOSIS — Y92009 Unspecified place in unspecified non-institutional (private) residence as the place of occurrence of the external cause: Secondary | ICD-10-CM | POA: Diagnosis not present

## 2018-08-28 DIAGNOSIS — R0602 Shortness of breath: Secondary | ICD-10-CM | POA: Diagnosis not present

## 2018-08-28 DIAGNOSIS — M6281 Muscle weakness (generalized): Secondary | ICD-10-CM | POA: Diagnosis not present

## 2018-08-28 DIAGNOSIS — I480 Paroxysmal atrial fibrillation: Secondary | ICD-10-CM | POA: Diagnosis not present

## 2018-08-28 DIAGNOSIS — M1990 Unspecified osteoarthritis, unspecified site: Secondary | ICD-10-CM | POA: Diagnosis not present

## 2018-08-28 DIAGNOSIS — T796XXA Traumatic ischemia of muscle, initial encounter: Secondary | ICD-10-CM | POA: Diagnosis not present

## 2018-08-28 DIAGNOSIS — R778 Other specified abnormalities of plasma proteins: Secondary | ICD-10-CM

## 2018-08-28 DIAGNOSIS — Z981 Arthrodesis status: Secondary | ICD-10-CM

## 2018-08-28 DIAGNOSIS — Z885 Allergy status to narcotic agent status: Secondary | ICD-10-CM

## 2018-08-28 DIAGNOSIS — R0902 Hypoxemia: Secondary | ICD-10-CM | POA: Diagnosis not present

## 2018-08-28 LAB — PROTIME-INR
INR: 1 (ref 0.8–1.2)
Prothrombin Time: 12.8 seconds (ref 11.4–15.2)

## 2018-08-28 LAB — COMPREHENSIVE METABOLIC PANEL
ALT: 23 U/L (ref 0–44)
AST: 55 U/L — ABNORMAL HIGH (ref 15–41)
Albumin: 3.8 g/dL (ref 3.5–5.0)
Alkaline Phosphatase: 42 U/L (ref 38–126)
Anion gap: 14 (ref 5–15)
BUN: 18 mg/dL (ref 8–23)
CO2: 24 mmol/L (ref 22–32)
Calcium: 10 mg/dL (ref 8.9–10.3)
Chloride: 100 mmol/L (ref 98–111)
Creatinine, Ser: 0.92 mg/dL (ref 0.44–1.00)
GFR calc Af Amer: >60 mL/min (ref >60)
GFR calc non Af Amer: 55 mL/min — ABNORMAL LOW (ref >60)
GLUCOSE: 127 mg/dL — AB (ref 70–99)
Potassium: 3.3 mmol/L — ABNORMAL LOW (ref 3.5–5.1)
Sodium: 138 mmol/L (ref 135–145)
TOTAL PROTEIN: 6.8 g/dL (ref 6.5–8.1)
Total Bilirubin: 0.7 mg/dL (ref 0.3–1.2)

## 2018-08-28 LAB — I-STAT TROPONIN, ED: Troponin i, poc: 0.13 ng/mL (ref 0.00–0.08)

## 2018-08-28 LAB — CBC WITH DIFFERENTIAL/PLATELET
Abs Immature Granulocytes: 0.03 10*3/uL (ref 0.00–0.07)
Basophils Absolute: 0 10*3/uL (ref 0.0–0.1)
Basophils Relative: 0 %
Eosinophils Absolute: 0 10*3/uL (ref 0.0–0.5)
Eosinophils Relative: 0 %
HCT: 45.8 % (ref 36.0–46.0)
Hemoglobin: 14.6 g/dL (ref 12.0–15.0)
Immature Granulocytes: 0 %
Lymphocytes Relative: 14 %
Lymphs Abs: 1.4 10*3/uL (ref 0.7–4.0)
MCH: 28.2 pg (ref 26.0–34.0)
MCHC: 31.9 g/dL (ref 30.0–36.0)
MCV: 88.6 fL (ref 80.0–100.0)
MONO ABS: 1 10*3/uL (ref 0.1–1.0)
MONOS PCT: 10 %
Neutro Abs: 7.5 10*3/uL (ref 1.7–7.7)
Neutrophils Relative %: 76 %
Platelets: 183 10*3/uL (ref 150–400)
RBC: 5.17 MIL/uL — ABNORMAL HIGH (ref 3.87–5.11)
RDW: 14.2 % (ref 11.5–15.5)
WBC: 10 10*3/uL (ref 4.0–10.5)
nRBC: 0 % (ref 0.0–0.2)

## 2018-08-28 LAB — CK: CK TOTAL: 1438 U/L — AB (ref 38–234)

## 2018-08-28 MED ORDER — SODIUM CHLORIDE 0.9% FLUSH: 3.0000 mL | Freq: Two times a day (BID) | INTRAVENOUS | Status: DC

## 2018-08-28 MED ORDER — ENOXAPARIN SODIUM 40 MG/0.4ML ~~LOC~~ SOLN: 40.0000 mg | SUBCUTANEOUS | Status: DC

## 2018-08-28 MED ORDER — TRAZODONE HCL 150 MG PO TABS: 75.0000 mg | ORAL_TABLET | Freq: Every day | ORAL | Status: DC

## 2018-08-28 MED ORDER — CALCIUM CITRATE-VITAMIN D 500-500 MG-UNIT PO CHEW
1.0000 | CHEWABLE_TABLET | Freq: Two times a day (BID) | ORAL | Status: DC
  Filled 2018-08-28: qty 1

## 2018-08-28 MED ORDER — ONDANSETRON HCL 4 MG PO TABS: 4.0000 mg | ORAL_TABLET | Freq: Four times a day (QID) | ORAL | Status: DC | PRN

## 2018-08-28 MED ORDER — ISOSORBIDE MONONITRATE ER 60 MG PO TB24: 60.0000 mg | ORAL_TABLET | Freq: Every day | ORAL | Status: DC

## 2018-08-28 MED ORDER — CLOPIDOGREL BISULFATE 75 MG PO TABS: 75.0000 mg | ORAL_TABLET | Freq: Every day | ORAL | Status: DC

## 2018-08-28 MED ORDER — MIRABEGRON ER 25 MG PO TB24: 25.0000 mg | ORAL_TABLET | Freq: Every day | ORAL | Status: DC

## 2018-08-28 MED ORDER — ASPIRIN EC 81 MG PO TBEC: 81.0000 mg | DELAYED_RELEASE_TABLET | Freq: Every day | ORAL | Status: DC

## 2018-08-28 MED ORDER — FAMOTIDINE 20 MG PO TABS: 10.0000 mg | ORAL_TABLET | Freq: Every day | ORAL | Status: DC

## 2018-08-28 MED ORDER — SODIUM CHLORIDE 0.9 % IV SOLN: 250.0000 mL | INTRAVENOUS | Status: DC | PRN

## 2018-08-28 MED ORDER — NITROGLYCERIN 0.4 MG SL SUBL: 0.4000 mg | SUBLINGUAL_TABLET | SUBLINGUAL | Status: DC | PRN

## 2018-08-28 MED ORDER — LORAZEPAM 2 MG/ML IJ SOLN: 0.5000 mg | INTRAMUSCULAR | Status: DC | PRN

## 2018-08-28 MED ORDER — SODIUM CHLORIDE 0.9% FLUSH: 3.0000 mL | INTRAVENOUS | Status: DC | PRN

## 2018-08-28 MED ORDER — LEVOTHYROXINE SODIUM 75 MCG PO TABS: 75.0000 ug | ORAL_TABLET | Freq: Every day | ORAL | Status: DC

## 2018-08-28 MED ORDER — MORPHINE SULFATE (PF) 2 MG/ML IV SOLN
1.0000 mg | INTRAVENOUS | Status: DC | PRN
  Administered 2018-08-28: 1 mg via INTRAVENOUS
  Filled 2018-08-28: qty 1

## 2018-08-28 MED ORDER — METOPROLOL TARTRATE 100 MG PO TABS: 100.0000 mg | ORAL_TABLET | Freq: Two times a day (BID) | ORAL | Status: DC

## 2018-08-28 MED ORDER — ONDANSETRON HCL 4 MG/2ML IJ SOLN
4.0000 mg | Freq: Once | INTRAMUSCULAR | Status: AC
  Administered 2018-08-28: 4 mg via INTRAVENOUS
  Filled 2018-08-28: qty 2

## 2018-08-28 MED ORDER — CALCIUM CITRATE-VITAMIN D 250-100 MG-UNIT PO TABS: 2.0000 | ORAL_TABLET | Freq: Two times a day (BID) | ORAL | Status: DC

## 2018-08-28 MED ORDER — NITROGLYCERIN 0.4 MG SL SUBL
0.3000 mg | SUBLINGUAL_TABLET | SUBLINGUAL | Status: DC | PRN
  Filled 2018-08-28 (×2): qty 100

## 2018-08-28 MED ORDER — CALCIUM CARBONATE-VITAMIN D 500-200 MG-UNIT PO TABS: 2.0000 | ORAL_TABLET | Freq: Two times a day (BID) | ORAL | Status: DC

## 2018-08-28 MED ORDER — DEXTROSE-NACL 5-0.45 % IV SOLN: INTRAVENOUS | Status: DC

## 2018-08-28 MED ORDER — ACETAMINOPHEN 500 MG PO TABS: 1000.0000 mg | ORAL_TABLET | Freq: Three times a day (TID) | ORAL | Status: DC

## 2018-08-28 MED ORDER — SODIUM CHLORIDE 0.9 % IV BOLUS
500.0000 mL | Freq: Once | INTRAVENOUS | Status: AC
  Administered 2018-08-28: 500 mL via INTRAVENOUS

## 2018-08-28 MED ORDER — ONDANSETRON HCL 4 MG/2ML IJ SOLN: 4.0000 mg | Freq: Four times a day (QID) | INTRAMUSCULAR | Status: DC | PRN

## 2018-08-28 MED ORDER — POTASSIUM CHLORIDE 10 MEQ/100ML IV SOLN
10.0000 meq | Freq: Once | INTRAVENOUS | Status: AC
  Administered 2018-08-28: 10 meq via INTRAVENOUS
  Filled 2018-08-28: qty 100

## 2018-08-28 MED ORDER — SIMVASTATIN 20 MG PO TABS: 20.0000 mg | ORAL_TABLET | Freq: Every evening | ORAL | Status: DC

## 2018-08-28 NOTE — ED Notes (Signed)
ED TO INPATIENT HANDOFF REPORT  ED Nurse Name and Phone #: Caryl Comes RN [phone]  S Name/Age/Gender Kimberly Bass 85 y.o. female Room/Bed: 019C/019C  Code Status   Code Status: Prior  Home/SNF/Other Nursing Home Patient oriented to: self Is this baseline? No   Triage Complete: Triage complete  Chief Complaint ams  Triage Note Pt [facility] ED via [facility] from [facility] with AMS and fall overnight. Family came to her residence after being unable to reach her and found her on the floor. Pt on external demand pacing that started 10 minutes ago en route with [facility] with HR 20-60's A/O x 3 99/55 PERRLA Pt has DNR order, however, paperwork at familys home. Unclear if family on the way to ER. EDP at bedside to eval and treat. Pt denies pain, other than demand external pacing sites.   Allergies Allergies  Allergen Reactions  . Codeine Nausea Only  . Meperidine Hcl Nausea Only  . Morphine Nausea Only  . Penicillins Nausea Only    Has patient had a PCN reaction causing immediate rash, facial/tongue/throat swelling, SOB or lightheadedness with hypotension: Unk Has patient had a PCN reaction causing severe rash involving mucus membranes or skin necrosis: Unk Has patient had a PCN reaction that required hospitalization: Unk Has patient had a PCN reaction occurring within the last 10 years: >50 years ago If all of the above answers are "NO", then may proceed with Cephalosporin use.     Level of Care/Admitting Diagnosis ED Disposition    ED Disposition Condition Comment   Admit  Hospital Area: [facility] [[id number]]  Level of Care: Cardiac Telemetry [103]  Diagnosis: ACS (acute coronary syndrome) [facility]) [[id number]]  Admitting Physician: Carron Curie Bai.Lain  Attending Physician: Sharyon Medicus, ALI Bai.Lain  Estimated length of stay: past midnight tomorrow  Certification:: I certify this patient will need inpatient services for at least 2  midnights  PT Class (Do Not Modify): Inpatient [101]  PT Acc Code (Do Not Modify): Private [1]       B Medical/Surgery History Past Medical History:  Diagnosis Date  . Arthritis    "mild; in all my joints" (04/27/2017)  . Benign essential tremor   . Coronary artery disease   . Cystitis with hematuria 04/26/2017  . Gait disorder   . H/O radioactive iodine thyroid ablation   . Hx of transient ischemic attack (TIA)   . Hyperlipidemia   . Hypertension    "not since MI in 2007" (04/27/2017)  . Hyperthyroidism    H/O radioactive iodine thyroid ablation [Z92.3]  . Hypothyroidism   . Lower extremity myoclonus   . Lumbosacral spondylosis   . Migraine    "I've had one in my lifetime" (04/27/2017)  . Mild obesity   . Myocardial infarction (HCC) 12/2005  . Myoclonus    Lower extremity involvement  . Obesity   . Pneumonia    "lots when I was a small child; not at all since" (04/27/2017)  . Seizures (HCC)    History is questionable  . Thyroid disease    Past Surgical History:  Procedure Laterality Date  . ABDOMINAL HYSTERECTOMY  1972   for uterine fibroids/notes 11/10/2010  . ANTERIOR CERVICAL DECOMP/DISCECTOMY FUSION  03/2004   [name] 11/10/2010  . APPENDECTOMY    . BACK SURGERY    . BREAST BIOPSY Right 1996   nodule resected/notes 11/10/2010  . CARDIAC CATHETERIZATION  01/03/2006  . CATARACT EXTRACTION W/ INTRAOCULAR LENS  IMPLANT, BILATERAL Bilateral   .  COLONOSCOPY  11/2003   [name] 11/10/2010  . ESOPHAGOGASTRODUODENOSCOPY  09/2006   [name] 11/10/2010  . LUMBAR DISC SURGERY  1990s X 1; 05/2003   "a nerve was pinching"; [name] 11/10/2010  . OVARY SURGERY Left 1999   benign tumor resected /notes 11/10/2010  . TONSILLECTOMY  ~ 1947     A IV Location/Drains/Wounds Patient Lines/Drains/Airways Status   Active Line/Drains/Airways    Name:   Placement date:   Placement time:   Site:   Days:   Peripheral IV 08/28/18 Right;Posterior Hand   08/28/18    1505    Hand   less  than 1   External Urinary Catheter   08/01/18    2100    -   27          Intake/Output Last 24 hours  Intake/Output Summary (Last 24 hours) at 08/28/2018 1750 Last data filed at 08/28/2018 1724 Gross per 24 hour  Intake 600 ml  Output -  Net 600 ml    Labs/Imaging Results for orders placed or performed during the hospital encounter of 08/28/18 (from the past 48 hour(s))  Comprehensive metabolic panel     Status: Abnormal   Collection Time: 08/28/18  2:42 PM  Result Value Ref Range   Sodium 138 135 - 145 mmol/L   Potassium 3.3 (L) 3.5 - 5.1 mmol/L   Chloride 100 98 - 111 mmol/L   CO2 24 22 - 32 mmol/L   Glucose, Bld 127 (H) 70 - 99 mg/dL   BUN 18 8 - 23 mg/dL   Creatinine, Ser 3.97 0.44 - 1.00 mg/dL   Calcium 67.3 8.9 - 41.9 mg/dL   Total Protein 6.8 6.5 - 8.1 g/dL   Albumin 3.8 3.5 - 5.0 g/dL   AST 55 (H) 15 - 41 U/L   ALT 23 0 - 44 U/L   Alkaline Phosphatase 42 38 - 126 U/L   Total Bilirubin 0.7 0.3 - 1.2 mg/dL   GFR calc non Af Amer 55 (L) >60 mL/min   GFR calc Af Amer >60 >60 mL/min   Anion gap 14 5 - 15    Comment: Performed at [facility], [address] [place], [place] [address]  CBC with Differential     Status: Abnormal   Collection Time: 08/28/18  2:42 PM  Result Value Ref Range   WBC 10.0 4.0 - 10.5 K/uL   RBC 5.17 (H) 3.87 - 5.11 MIL/uL   Hemoglobin 14.6 12.0 - 15.0 g/dL   HCT 40.9 73.5 - 32.9 %   MCV 88.6 80.0 - 100.0 fL   MCH 28.2 26.0 - 34.0 pg   MCHC 31.9 30.0 - 36.0 g/dL   RDW 92.4 26.8 - 34.1 %   Platelets 183 150 - 400 K/uL   nRBC 0.0 0.0 - 0.2 %   Neutrophils Relative % 76 %   Neutro Abs 7.5 1.7 - 7.7 K/uL   Lymphocytes Relative 14 %   Lymphs Abs 1.4 0.7 - 4.0 K/uL   Monocytes Relative 10 %   Monocytes Absolute 1.0 0.1 - 1.0 K/uL   Eosinophils Relative 0 %   Eosinophils Absolute 0.0 0.0 - 0.5 K/uL   Basophils Relative 0 %   Basophils Absolute 0.0 0.0 - 0.1 K/uL   Immature Granulocytes 0 %   Abs Immature Granulocytes 0.03 0.00 -  0.07 K/uL    Comment: Performed at [facility], [address], [place], [place] [address]  CK  Status: Abnormal   Collection Time: 08/28/18  3:04 PM  Result Value Ref Range   Total CK 1,438 (H) 38 - 234 U/L    Comment: Performed at [facility], [address], [place], [place] [address]  I-Stat Troponin, ED (not at [facility])     Status: Abnormal   Collection Time: 08/28/18  3:14 PM  Result Value Ref Range   Troponin i, poc 0.13 (HH) 0.00 - 0.08 ng/mL   Comment NOTIFIED PHYSICIAN    Comment 3            Comment: Due to the release kinetics of cTnI, a negative result within the first hours of the onset of symptoms does not rule out myocardial infarction with certainty. If myocardial infarction is still suspected, repeat the test at appropriate intervals.    Ct Head Wo Contrast  Result Date: 08/28/2018 CLINICAL DATA:  Found on floor at independent living facility by family, patient does remember falling, history coronary artery disease post MI, hypertension, seizures, EXAM: CT HEAD WITHOUT CONTRAST CT CERVICAL SPINE WITHOUT CONTRAST TECHNIQUE: Multidetector CT imaging of the head and cervical spine was performed following the standard protocol without intravenous contrast. Multiplanar CT image reconstructions of the cervical spine were also generated. COMPARISON:  12/12/2017 FINDINGS: CT HEAD FINDINGS Brain: Generalized atrophy. Normal ventricular morphology. No midline shift or mass effect. Small vessel chronic ischemic changes of deep cerebral white matter. No intracranial hemorrhage, mass lesion, evidence of acute infarction, or extra-axial fluid collection. Vascular: Atherosclerotic calcifications of internal carotid and vertebral arteries at skull base Skull: Intact Sinuses/Orbits: Mucosal retention cyst RIGHT maxillary sinus. Otherwise clear. Other: N/A CT CERVICAL SPINE FINDINGS Alignment: Normal Skull base and vertebrae: Osseous demineralization. Visualized skull base intact.  Prior anterior fusion C5-C7. Multilevel degenerative disc disease changes with disc space narrowing and endplate spur formation. Multilevel facet degenerative changes. Vertebral body heights maintained without fracture or subluxation. Minimal scattered motion artifacts. Endplate spurring at C6-C7 causes AP narrowing of the spinal canal and probable cord compression greater LEFT of midline. Hardware appears intact. Soft tissues and spinal canal: Prevertebral soft tissues normal thickness. Atherosclerotic calcifications at carotid bifurcations. Remaining cervical soft tissues unremarkable. Disc levels:  As above Upper chest: Lung apices clear Other: N/A IMPRESSION: Mild atrophy with small vessel chronic ischemic changes of deep cerebral white matter. No acute intracranial abnormalities. Degenerative disc and facet disease changes of the cervical spine. Prior anterior cervical fusion C5-C7. No acute cervical spine abnormalities. Electronically Signed   By: Ulyses Southward M.D.   On: 08/28/2018 15:57   Ct Cervical Spine Wo Contrast  Result Date: 08/28/2018 CLINICAL DATA:  Found on floor at independent living facility by family, patient does remember falling, history coronary artery disease post MI, hypertension, seizures, EXAM: CT HEAD WITHOUT CONTRAST CT CERVICAL SPINE WITHOUT CONTRAST TECHNIQUE: Multidetector CT imaging of the head and cervical spine was performed following the standard protocol without intravenous contrast. Multiplanar CT image reconstructions of the cervical spine were also generated. COMPARISON:  12/12/2017 FINDINGS: CT HEAD FINDINGS Brain: Generalized atrophy. Normal ventricular morphology. No midline shift or mass effect. Small vessel chronic ischemic changes of deep cerebral white matter. No intracranial hemorrhage, mass lesion, evidence of acute infarction, or extra-axial fluid collection. Vascular: Atherosclerotic calcifications of internal carotid and vertebral arteries at skull base Skull:  Intact Sinuses/Orbits: Mucosal retention cyst RIGHT maxillary sinus. Otherwise clear. Other: N/A CT CERVICAL SPINE FINDINGS Alignment: Normal Skull base and vertebrae: Osseous demineralization. Visualized skull base intact. Prior anterior fusion  C5-C7. Multilevel degenerative disc disease changes with disc space narrowing and endplate spur formation. Multilevel facet degenerative changes. Vertebral body heights maintained without fracture or subluxation. Minimal scattered motion artifacts. Endplate spurring at C6-C7 causes AP narrowing of the spinal canal and probable cord compression greater LEFT of midline. Hardware appears intact. Soft tissues and spinal canal: Prevertebral soft tissues normal thickness. Atherosclerotic calcifications at carotid bifurcations. Remaining cervical soft tissues unremarkable. Disc levels:  As above Upper chest: Lung apices clear Other: N/A IMPRESSION: Mild atrophy with small vessel chronic ischemic changes of deep cerebral white matter. No acute intracranial abnormalities. Degenerative disc and facet disease changes of the cervical spine. Prior anterior cervical fusion C5-C7. No acute cervical spine abnormalities. Electronically Signed   By: Ulyses Southward M.D.   On: 08/28/2018 15:57   Dg Chest Port 1 View  Result Date: 08/28/2018 CLINICAL DATA:  Shortness of breath. EXAM: PORTABLE CHEST 1 VIEW COMPARISON:  April 27, 2017 FINDINGS: No pneumothorax. A transcutaneous pacer overlies left-sided chest. The heart, hila, mediastinum, lungs, and pleura are unremarkable. Minimal atelectasis in the left base. IMPRESSION: No active disease. Electronically Signed   By: Gerome Sam III M.D   On: 08/28/2018 15:07    Pending Labs Unresulted Labs (From admission, onward)    Start     Ordered   08/28/18 1600  Protime-INR  Once,   STAT     08/28/18 1600   08/28/18 1442  Urinalysis, Routine w reflex microscopic  ONCE - STAT,   STAT     08/28/18 1441   Signed and Held  Troponin I - Now  Then Q6H  Now then every 6 hours,   R     Signed and Held   Signed and Held  TSH  Once,   R     Signed and Held          Vitals/Pain Today's Vitals   08/28/18 1705 08/28/18 1715 08/28/18 1730 08/28/18 1745  BP:  (!) 79/30 (!) 74/40 (!) 59/44  Pulse:  (!) 58 (!) 58 (!) 58  Resp:  16 17 (!) 27  Temp:      TempSrc:      SpO2:  (!) 88% 91% 97%  Weight:      Height:      PainSc: Asleep       Isolation Precautions No active isolations  Medications Medications  ondansetron (ZOFRAN) injection 4 mg (4 mg Intravenous Given 08/28/18 1512)  potassium chloride 10 mEq in 100 mL IVPB (0 mEq Intravenous Stopped 08/28/18 1724)  sodium chloride 0.9 % bolus 500 mL (0 mLs Intravenous Stopped 08/28/18 1724)    Mobility non-ambulatory High fall risk   Focused Assessments Cardiac Assessment Handoff:  Cardiac Rhythm: Sinus bradycardia, Normal sinus rhythm, Other (Comment)(Rate 20-60's SB with mulyifocal PAC/PVCs) Lab Results  Component Value Date   CKTOTAL 1,438 (H) 08/28/2018   No results found for: DDIMER Does the Patient currently have chest pain? No     R Recommendations: See Admitting Provider Note  Report given to:   Additional Notes: Pt brought in to ER initially with AMS after fall.  CT head and spine cleared.  Pt came in on zoll with HR in 20s (new en route with [facility]).  Positive trop and CK.  EDP and admitting have discussed with family and have chosen to keep patient DNR and comfort care measures only without cardiac intervention for MI.  Pt currently with pressures dropping, however comfortably sleeping on 2 L Needles without distress.

## 2018-08-28 NOTE — ED Provider Notes (Signed)
[facility] Provider [facility]   CSN: [id number] Arrival date & time: 08/28/18  1433    History   Chief Complaint Chief Complaint  Patient presents with  . Altered Mental Status  . Bradycardia    Demand pacing by EMS    HPI Kimberly Bass is a 85 y.o. female.     85 year old female with prior medical history as detailed below presents for evaluation of altered mental status.  Patient is not able to provide significant history.  Level 5 caveat secondary to same.  Majority history obtained from EMS and the patient's son who is her healthcare power of attorney.  Patient son reports that she was found on the floor of her bathroom this morning.  She appears to be more confused than her baseline.  It is unclear how long she was on the floor the bathroom.  The son suspects that she may have been there all night.  Son found her on the floor.  He does not feel that there is any significant trauma based on his initial impression.  EMS reports that the patient had some vomiting in route to the ED.  They report that her heart rate dropped concurrently into the 20s.  They initiated external pacing at that time.  Upon arrival to the ED she is without indication for external pacing.  She is a heart rate in the 60s.  She is mentating.  She is answering questions appropriately.  The history is provided by the patient and medical records.  Altered Mental Status  Presenting symptoms: disorientation   Severity:  Mild Episode history:  Single Duration:  12 hours Timing:  Constant Progression:  Waxing and waning Chronicity:  New   Past Medical History:  Diagnosis Date  . Arthritis    "mild; in all my joints" (04/27/2017)  . Benign essential tremor   . Coronary artery disease   . Cystitis with hematuria 04/26/2017  . Gait disorder   . H/O radioactive iodine thyroid ablation   . Hx of transient ischemic attack (TIA)   . Hyperlipidemia   . Hypertension      "not since MI in 2007" (04/27/2017)  . Hyperthyroidism    H/O radioactive iodine thyroid ablation [Z92.3]  . Hypothyroidism   . Lower extremity myoclonus   . Lumbosacral spondylosis   . Migraine    "I've had one in my lifetime" (04/27/2017)  . Mild obesity   . Myocardial infarction (HCC) 12/2005  . Myoclonus    Lower extremity involvement  . Obesity   . Pneumonia    "lots when I was a small child; not at all since" (04/27/2017)  . Seizures (HCC)    History is questionable  . Thyroid disease     Patient Active Problem List   Diagnosis Date Noted  . Acute metabolic encephalopathy   . Hypomagnesemia   . Pyuria 08/01/2018  . AMS (altered mental status) 08/01/2018  . Altered mental status 08/01/2018  . Hypothyroidism 12/12/2017  . Acute encephalopathy 12/12/2017  . Unwitnessed fall 12/12/2017  . Essential hypertension 12/12/2017  . Hypokalemia 12/12/2017  . Hyperlipidemia 05/05/2017  . Urinary tract infection with hematuria 04/27/2017  . Tremor, essential 11/03/2015  . 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 Abnormality of gait 08/15/2012  . Essential and other specified forms of tremor 08/15/2012  . Degeneration of lumbar or lumbosacral intervertebral disc 08/15/2012  . Hx of low back pain 08/15/2012  . CAD (coronary artery disease) 12/23/2010  . Hx of transient  ischemic attack (TIA)   . CONSTIPATION 02/27/2009  . PERSONAL HX COLONIC POLYPS 02/27/2009    Past Surgical History:  Procedure Laterality Date  . ABDOMINAL HYSTERECTOMY  1972   for uterine fibroids/notes 11/10/2010  . ANTERIOR CERVICAL DECOMP/DISCECTOMY FUSION  03/2004   [name] 11/10/2010  . APPENDECTOMY    . BACK SURGERY    . BREAST BIOPSY Right 1996   nodule resected/notes 11/10/2010  . CARDIAC CATHETERIZATION  01/03/2006  . CATARACT EXTRACTION W/ INTRAOCULAR LENS  IMPLANT, BILATERAL Bilateral   . COLONOSCOPY  11/2003   [name] 11/10/2010  . ESOPHAGOGASTRODUODENOSCOPY  09/2006   [name] 11/10/2010  . LUMBAR DISC  SURGERY  1990s X 1; 05/2003   "a nerve was pinching"; [name] 11/10/2010  . OVARY SURGERY Left 1999   benign tumor resected /notes 11/10/2010  . TONSILLECTOMY  ~ 1947     OB History   No obstetric history on file.      Home Medications    Prior to Admission medications   Medication Sig Start Date End Date Taking? Authorizing Provider  acetaminophen (TYLENOL ARTHRITIS PAIN) 650 MG CR tablet Take 1,300 mg by mouth 3 (three) times daily.     [provider]  aspirin 81 MG tablet Take 81 mg by mouth daily.      [provider]  baclofen (LIORESAL) 20 MG tablet Take 20-30 mg by mouth See admin instructions. 30 mg every morning, 20 mg every afternoon, 30 mg every evening 10/13/17   [provider]  calcium-vitamin D 250-100 MG-UNIT tablet Take 2 tablets by mouth 2 (two) times daily.    [provider]  clopidogrel (PLAVIX) 75 MG tablet TAKE 1 TABLET BY MOUTH  DAILY Patient taking differently: Take 75 mg by mouth daily after breakfast.  07/04/18   Nahser, Deloris Ping, MD  gabapentin (NEURONTIN) 400 MG capsule TAKE 1 CAPSULE (400 MG TOTAL) BY MOUTH 4 (FOUR) TIMES DAILY. Patient taking differently: Take 400-800 mg by mouth See admin instructions. 400 mg every morning, 800 mg every afternoon, 400 mg at bedtime. 07/10/18   Butch Penny, NP  isosorbide mononitrate (IMDUR) 30 MG 24 hr tablet Take 1 tablet (30 mg total) by mouth daily. 08/05/18   Rodolph Bong, MD  levothyroxine (SYNTHROID, LEVOTHROID) 75 MCG tablet Take 75 mcg by mouth daily.      [provider]  metoprolol tartrate (LOPRESSOR) 50 MG tablet Take 1 tablet (50 mg total) by mouth 2 (two) times daily. 08/05/18   Rodolph Bong, MD  mirabegron ER (MYRBETRIQ) 50 MG TB24 tablet Take 25 mg by mouth daily.     [provider]  Multiple Vitamin (MULTIVITAMIN) tablet Take 1 tablet by mouth daily.      [provider]  NITROSTAT 0.4 MG SL tablet DISSOLVE 1 TAB UNDER THE  TONGUE EVERY  5 MIN AS  NEEDED FOR CHEST PAIN. MAX  3 TOTAL DOSES/15MIN. 911 IF NO RELIEF 06/29/18   Nahser, Deloris Ping, MD  ranitidine (ZANTAC) 150 MG tablet Take 150 mg by mouth 2 (two) times daily.      [provider]  simvastatin (ZOCOR) 20 MG tablet Take 20 mg by mouth every evening.     [provider]  traZODone (DESYREL) 50 MG tablet Take 75 mg by mouth at bedtime.     [provider]  vitamin B-12 (CYANOCOBALAMIN) 1000 MCG tablet Take 1,000 mcg by mouth daily.    [provider]    Family History Family History  Problem Relation Age of Onset  . Heart disease Father   . Heart attack Mother   . Stroke Sister   . Heart attack Sister   . Heart attack Brother        Possible  . Diabetes Sister     Social History Social History   Tobacco Use  . Smoking status: Never Smoker  . Smokeless tobacco: Never Used  Substance Use Topics  . Alcohol use: No  . Drug use: No     Allergies   Codeine; Meperidine hcl; Morphine; and Penicillins   Review of Systems Review of Systems  Unable to perform ROS: Acuity of condition  All other systems reviewed and are negative.    Physical Exam Updated Vital Signs BP (!) 128/51   Pulse 66   Temp 98.1 F (36.7 C) (Oral)   Resp 18   Ht 4\' 11"  (1.499 m) Comment: Per son/HCPOA  Wt 65.8 kg   SpO2 95%   BMI 29.29 kg/m   Physical Exam Vitals signs and nursing note reviewed.  Constitutional:      General: She is not in acute distress.    Appearance: Normal appearance. She is well-developed.  HENT:     Head: Normocephalic and atraumatic.  Eyes:     Conjunctiva/sclera: Conjunctivae normal.     Pupils: Pupils are equal, round, and reactive to light.  Neck:     Musculoskeletal: Normal range of motion and neck supple.  Cardiovascular:     Rate and Rhythm: Normal rate and regular rhythm.     Heart sounds: Normal heart sounds.  Pulmonary:     Effort: Pulmonary effort is normal. No respiratory distress.     Breath  sounds: Normal breath sounds.  Abdominal:     General: There is no distension.     Palpations: Abdomen is soft.     Tenderness: There is no abdominal tenderness.  Musculoskeletal: Normal range of motion.        General: No deformity.  Skin:    General: Skin is warm and dry.  Neurological:     General: No focal deficit present.     Mental Status: She is alert.     Cranial Nerves: No cranial nerve deficit.     Motor: No weakness.     Coordination: Coordination normal.     Comments: No focal deficit   Alert to person and place  Appears mildly confused        ED Treatments / Results  Labs (all labs ordered are listed, but only abnormal results are displayed) Labs Reviewed  COMPREHENSIVE METABOLIC PANEL - Abnormal; Notable for the following components:      Result Value   Potassium 3.3 (*)    Glucose, Bld 127 (*)    AST 55 (*)    GFR calc non Af Amer 55 (*)    All other components within normal limits  CBC WITH DIFFERENTIAL/PLATELET - Abnormal; Notable for the following components:   RBC 5.17 (*)    All other components within normal limits  I-STAT TROPONIN, ED - Abnormal; Notable for the following components:   Troponin i, poc 0.13 (*)    All other components within normal limits  URINALYSIS, ROUTINE W REFLEX MICROSCOPIC  CK  PROTIME-INR    EKG EKG Interpretation  Date/Time:  Monday August 28 2018 14:39:42 EST Ventricular Rate:  62 PR Interval:    QRS Duration: 120 QT Interval:  457 QTC Calculation: 465 R Axis:   -52 Text Interpretation:  Sinus rhythm Borderline prolonged PR interval Incomplete left bundle branch block Probable left ventricular hypertrophy Confirmed by Kristine Royal [phone]) on 08/28/2018 2:43:29 PM   Radiology Ct Head Wo Contrast  Result Date: 08/28/2018 CLINICAL DATA:  Found on floor at independent living facility by family, patient does remember falling, history coronary artery disease post MI, hypertension, seizures, EXAM: CT HEAD WITHOUT  CONTRAST CT CERVICAL SPINE WITHOUT CONTRAST TECHNIQUE: Multidetector CT imaging of the head and cervical spine was performed following the standard protocol without intravenous contrast. Multiplanar CT image reconstructions of the cervical spine were also generated. COMPARISON:  12/12/2017 FINDINGS: CT HEAD FINDINGS Brain: Generalized atrophy. Normal ventricular morphology. No midline shift or mass effect. Small vessel chronic ischemic changes of deep cerebral white matter. No intracranial hemorrhage, mass lesion, evidence of acute infarction, or extra-axial fluid collection. Vascular: Atherosclerotic calcifications of internal carotid and vertebral arteries at skull base Skull: Intact Sinuses/Orbits: Mucosal retention cyst RIGHT maxillary sinus. Otherwise clear. Other: N/A CT CERVICAL SPINE FINDINGS Alignment: Normal Skull base and vertebrae: Osseous demineralization. Visualized skull base intact. Prior anterior fusion C5-C7. Multilevel degenerative disc disease changes with disc space narrowing and endplate spur formation. Multilevel facet degenerative changes. Vertebral body heights maintained without fracture or subluxation. Minimal scattered motion artifacts. Endplate spurring at C6-C7 causes AP narrowing of the spinal canal and probable cord compression greater LEFT of midline. Hardware appears intact. Soft tissues and spinal canal: Prevertebral soft tissues normal thickness. Atherosclerotic calcifications at carotid bifurcations. Remaining cervical soft tissues unremarkable. Disc levels:  As above Upper chest: Lung apices clear Other: N/A IMPRESSION: Mild atrophy with small vessel chronic ischemic changes of deep cerebral white matter. No acute intracranial abnormalities. Degenerative disc and facet disease changes of the cervical spine. Prior anterior cervical fusion C5-C7. No acute cervical spine abnormalities. Electronically Signed   By: Ulyses Southward M.D.   On: 08/28/2018 15:57   Ct Cervical Spine Wo  Contrast  Result Date: 08/28/2018 CLINICAL DATA:  Found on floor at independent living facility by family, patient does remember falling, history coronary artery disease post MI, hypertension, seizures, EXAM: CT HEAD WITHOUT CONTRAST CT CERVICAL SPINE WITHOUT CONTRAST TECHNIQUE: Multidetector CT imaging of the head and cervical spine was performed following the standard protocol without intravenous contrast. Multiplanar CT image reconstructions of the cervical spine were also generated. COMPARISON:  12/12/2017 FINDINGS: CT HEAD FINDINGS Brain: Generalized atrophy. Normal ventricular morphology. No midline shift or mass effect. Small vessel chronic ischemic changes of deep cerebral white matter. No intracranial hemorrhage, mass lesion, evidence of acute infarction, or extra-axial fluid collection. Vascular: Atherosclerotic calcifications of internal carotid and vertebral arteries at skull base Skull: Intact Sinuses/Orbits: Mucosal retention cyst RIGHT maxillary sinus. Otherwise clear. Other: N/A CT CERVICAL SPINE FINDINGS Alignment: Normal Skull base and vertebrae: Osseous demineralization. Visualized skull base intact. Prior anterior fusion C5-C7. Multilevel degenerative disc disease changes with disc space narrowing and endplate spur formation. Multilevel facet degenerative changes. Vertebral body heights maintained without fracture or subluxation. Minimal scattered motion artifacts. Endplate spurring at C6-C7 causes AP narrowing of the spinal canal and probable cord compression greater LEFT of midline. Hardware appears intact. Soft tissues and spinal canal: Prevertebral soft tissues normal thickness. Atherosclerotic calcifications at carotid bifurcations. Remaining cervical soft tissues unremarkable. Disc levels:  As above Upper chest: Lung apices clear Other: N/A IMPRESSION: Mild atrophy with small vessel chronic ischemic changes of deep cerebral white matter. No acute intracranial abnormalities. Degenerative  disc and facet disease changes of the cervical spine. Prior anterior cervical  fusion C5-C7. No acute cervical spine abnormalities. Electronically Signed   By: Ulyses Southward M.D.   On: 08/28/2018 15:57   Dg Chest Port 1 View  Result Date: 08/28/2018 CLINICAL DATA:  Shortness of breath. EXAM: PORTABLE CHEST 1 VIEW COMPARISON:  April 27, 2017 FINDINGS: No pneumothorax. A transcutaneous pacer overlies left-sided chest. The heart, hila, mediastinum, lungs, and pleura are unremarkable. Minimal atelectasis in the left base. IMPRESSION: No active disease. Electronically Signed   By: Gerome Sam III M.D   On: 08/28/2018 15:07    Procedures Procedures (including critical care time)  Medications Ordered in ED Medications  ondansetron [facility]) injection 4 mg (4 mg Intravenous Given 08/28/18 1512)     Initial Impression / Assessment and Plan / ED Course  I have reviewed the triage vital signs and the nursing notes.  Pertinent labs & imaging results that were available during my care of the patient were reviewed by me and considered in my medical decision making (see chart for details).       MDM  Screen complete  Patient is presenting for evaluation of altered mental status after being found down on her bathroom floor.  Patient is without evidence of significant traumatic injury.  Of note, patient was found to be perhaps symptomatically bradycardic in route.  She was briefly paced externally by EMS.  Her bradycardia had resolved upon arrival to the ED.  Patient's screening labs did show a mildly elevated troponin at 0.13.  Imaging and other labs did not reveal other acute significant pathology.  Hospitalist service ([facility]) is aware of case and will evaluate for admission.    Final Clinical Impressions(s) / ED Diagnoses   Final diagnoses:  Altered mental status, unspecified altered mental status type    ED Discharge Orders    None       Wynetta Fines, MD 08/28/18 [phone]

## 2018-08-28 NOTE — ED Notes (Signed)
RN informed Pt can receive visitor  

## 2018-08-28 NOTE — ED Notes (Addendum)
Pt unable to arouse, SBP 80s, and color increasingly pale. Admitting provider Dr. Sharyon Medicus paged to assess patient and speak with family regarding plan of care (pt DNR) before transporting upstairs to ensure proper placement of patient to floor.

## 2018-08-28 NOTE — ED Triage Notes (Signed)
Pt arrives to [facility] ED via [facility] from [facility] [employer] [facility] with AMS and fall overnight. Family came to her residence after being unable to reach her and found her on the floor. Pt on external demand pacing that started 10 minutes ago en route with [facility] with HR 20-60's A/O x 3 99/55 PERRLA Pt has DNR order, however, paperwork at familys home. Unclear if family on the way to ER. EDP at bedside to eval and treat. Pt denies pain, other than demand external pacing sites.

## 2018-08-28 NOTE — Progress Notes (Signed)
Called by RN to talk to pt's son about comfort care. After a long discussion about what comfort care is, levels of care, etc, he chose to make his mother full comfort care. He is the HCPOA and states he and his mother have discussed this in the past and she wouldn't want any invasive procedures, CPR, or intubation. He states he does not want any procedures, labs, po meds, etc. He does not want his mother to feel pain or suffer. Orders changed to reflect son's wishes. Will start Ativan and Morphine in case she has indications of pain, respiratory distress, and/or restlessness. Discussed with RN.  KJKG, NP [facility]

## 2018-08-28 NOTE — Plan of Care (Signed)
  Problem: Pain Managment: Goal: General experience of comfort will improve Outcome: Progressing   Problem: Safety: Goal: Ability to remain free from injury will improve Outcome: Progressing   Problem: Skin Integrity: Goal: Risk for impaired skin integrity will decrease Outcome: Progressing   

## 2018-08-28 NOTE — Progress Notes (Signed)
Patient's family states they discussed with ED Dr they wanted patient to be comfort care. Family requesting to talk to on call provider. Maren Reamer paged and notified. Family refusing all meds and labs for tonight. State they just want patient to be made comfortable.

## 2018-08-28 NOTE — H&P (Signed)
[facility]                                                                                    Patient Demographics  Kimberly Bass, is a 85 y.o. female  CSN: [id number]  MRN: [id number]  DOB - [birth year]  Admit Date - 08/28/2018  Outpatient Primary MD for the patient is Merri Brunette, MD   With History of -  Past Medical History:  Diagnosis Date  . Arthritis    "mild; in all my joints" (04/27/2017)  . Benign essential tremor   . Coronary artery disease   . Cystitis with hematuria 04/26/2017  . Gait disorder   . H/O radioactive iodine thyroid ablation   . Hx of transient ischemic attack (TIA)   . Hyperlipidemia   . Hypertension    "not since MI in 2007" (04/27/2017)  . Hyperthyroidism    H/O radioactive iodine thyroid ablation [Z92.3]  . Hypothyroidism   . Lower extremity myoclonus   . Lumbosacral spondylosis   . Migraine    "I've had one in my lifetime" (04/27/2017)  . Mild obesity   . Myocardial infarction (HCC) 12/2005  . Myoclonus    Lower extremity involvement  . Obesity   . Pneumonia    "lots when I was a small child; not at all since" (04/27/2017)  . Seizures (HCC)    History is questionable  . Thyroid disease       Past Surgical History:  Procedure Laterality Date  . ABDOMINAL HYSTERECTOMY  1972   for uterine fibroids/notes 11/10/2010  . ANTERIOR CERVICAL DECOMP/DISCECTOMY FUSION  03/2004   [name] 11/10/2010  . APPENDECTOMY    . BACK SURGERY    . BREAST BIOPSY Right 1996   nodule resected/notes 11/10/2010  . CARDIAC CATHETERIZATION  01/03/2006  . CATARACT EXTRACTION W/ INTRAOCULAR LENS  IMPLANT, BILATERAL Bilateral   . COLONOSCOPY  11/2003   [name] 11/10/2010  . ESOPHAGOGASTRODUODENOSCOPY  09/2006   [name] 11/10/2010  . LUMBAR DISC SURGERY  1990s X 1; 05/2003   "a nerve was pinching"; [name] 11/10/2010  . OVARY SURGERY Left 1999   benign tumor resected /notes 11/10/2010  . TONSILLECTOMY  ~ 1947    in for   Chief Complaint   Patient presents with  . Altered Mental Status  . Bradycardia    Demand pacing by EMS     HPI  Kimberly Bass  is a 85 y.o. female, with past medical history significant for CAD, hypertension, hyperlipidemia, hypothyroidism and TIA presenting after she was found by her son on the floor of the bathroom today in a.m. after a fall the night before.  Patient became confused and the son reports that she might have been there on the floor all night.  When I saw the patient she could not give any history due to obtundation.  No reports of chest pains, shortness of breath .  While being transferred by EMS the patient had episode of bradycardia associated with nausea and vomiting.  An external pacemaker was placed and was removed upon arrival to the emergency room. Her troponin was slightly elevated at 0.13.  All other work-up including CT of the  head was unremarkable    Review of Systems    In addition to the HPI above,  No Fever-chills, No Headache, No changes with Vision or hearing, No problems swallowing food or Liquids, No Chest pain, Cough or Shortness of Breath, No Abdominal pain, No Nausea or Vommitting, Bowel movements are regular, No Blood in stool or Urine, No dysuria, No new skin rashes or bruises, No new joints pains-aches,  No new weakness, tingling, numbness in any extremity, No recent weight gain or loss, No polyuria, polydypsia or polyphagia, No significant Mental Stressors.  A full 10 point Review of Systems was done, except as stated above, all other Review of Systems were negative.   Social History Social History   Tobacco Use  . Smoking status: Never Smoker  . Smokeless tobacco: Never Used  Substance Use Topics  . Alcohol use: No     Family History Family History  Problem Relation Age of Onset  . Heart disease Father   . Heart attack Mother   . Stroke Sister   . Heart attack Sister   . Heart attack Brother        Possible  . Diabetes Sister       Prior to Admission medications   Medication Sig Start Date End Date Taking? Authorizing Provider  acetaminophen (TYLENOL ARTHRITIS PAIN) 650 MG CR tablet Take 1,300 mg by mouth 3 (three) times daily.    Yes [provider]  amLODipine (NORVASC) 2.5 MG tablet Take 2.5 mg by mouth at bedtime. 08/25/18  Yes [provider]  aspirin EC 81 MG tablet Take 81 mg by mouth at bedtime.   Yes [provider]  baclofen (LIORESAL) 20 MG tablet Take 20-30 mg by mouth See admin instructions. Take 1 1/2 tablets (30 mg) by mouth every morning, 1 tablet (20 mg) in the afternoon and 1 1/2 tablets (30 mg) at night 10/13/17  Yes [provider]  calcium-vitamin D (CALCIUM 500+D HIGH POTENCY) 500-400 MG-UNIT tablet Take 1 tablet by mouth 2 (two) times daily.   Yes [provider]  clopidogrel (PLAVIX) 75 MG tablet TAKE 1 TABLET BY MOUTH  DAILY Patient taking differently: Take 75 mg by mouth daily after breakfast.  07/04/18  Yes Nahser, Deloris Ping, MD  Cyanocobalamin 2500 MCG TABS Take 2,500 mcg by mouth daily. Vitamin B12   Yes [provider]  gabapentin (NEURONTIN) 400 MG capsule TAKE 1 CAPSULE (400 MG TOTAL) BY MOUTH 4 (FOUR) TIMES DAILY. Patient taking differently: Take 400 mg by mouth 4 (four) times daily. AM, noon, 6pm, bedtime 07/10/18  Yes Butch Penny, NP  isosorbide mononitrate (IMDUR) 60 MG 24 hr tablet Take 60 mg by mouth daily. 08/10/18  Yes [provider]  levothyroxine (SYNTHROID, LEVOTHROID) 75 MCG tablet Take 75 mcg by mouth daily.     Yes [provider]  metoprolol tartrate (LOPRESSOR) 100 MG tablet Take 100 mg by mouth 2 (two) times daily. 08/03/18  Yes [provider]  mirabegron ER (MYRBETRIQ) 25 MG TB24 tablet Take 25 mg by mouth daily.    Yes [provider]  Multiple Vitamin (MULTIVITAMIN WITH MINERALS) TABS tablet Take 1 tablet by mouth daily.   Yes [provider]  NITROSTAT 0.4 MG SL tablet  DISSOLVE 1 TAB UNDER THE  TONGUE EVERY 5 MIN AS  NEEDED FOR CHEST PAIN. MAX  3 TOTAL DOSES/15MIN. 911 IF NO RELIEF Patient taking differently: Place 0.4 mg under the tongue every 5 (five) minutes as needed for  chest pain (max 3 doses/15 minutes - call 911 if no relief).  06/29/18  Yes Nahser, Deloris Ping, MD  ranitidine (ZANTAC) 150 MG tablet Take 150 mg by mouth 2 (two) times daily.     Yes [provider]  simvastatin (ZOCOR) 20 MG tablet Take 20 mg by mouth at bedtime.    Yes [provider]  traZODone (DESYREL) 50 MG tablet Take 75 mg by mouth at bedtime.    Yes [provider]  triamterene-hydrochlorothiazide (MAXZIDE-25) 37.5-25 MG tablet Take 1 tablet by mouth daily. 08/03/18  Yes [provider]  isosorbide mononitrate (IMDUR) 30 MG 24 hr tablet Take 1 tablet (30 mg total) by mouth daily. Patient not taking: Reported on 08/28/2018 08/05/18   Rodolph Bong, MD  metoprolol tartrate (LOPRESSOR) 50 MG tablet Take 1 tablet (50 mg total) by mouth 2 (two) times daily. Patient not taking: Reported on 08/28/2018 08/05/18   Rodolph Bong, MD    Allergies  Allergen Reactions  . Codeine Nausea Only  . Meperidine Hcl Nausea Only  . Morphine Nausea Only  . Penicillins Nausea Only    Has patient had a PCN reaction causing immediate rash, facial/tongue/throat swelling, SOB or lightheadedness with hypotension: Unk Has patient had a PCN reaction causing severe rash involving mucus membranes or skin necrosis: Unk Has patient had a PCN reaction that required hospitalization: Unk Has patient had a PCN reaction occurring within the last 10 years: >50 years ago If all of the above answers are "NO", then may proceed with Cephalosporin use.     Physical Exam  Vitals  Blood pressure 109/67, pulse (!) 57, temperature 97.7 F (36.5 C), temperature source Oral, resp. rate (!) 27, height  (1.499 m), weight 65.8 kg, SpO2 100 %.   1. General well-developed,  well-nourished lethargic  2.  Could not be evaluated due to lethargy  3.  Neurologically could not be evaluated due to lethargy  4. Ears and Eyes appear Normal, Conjunctivae clear, PERRLA. Moist Oral Mucosa.  5. Supple Neck, No JVD, No cervical lymphadenopathy appriciated, No Carotid Bruits.  6. Symmetrical Chest wall movement, poor respiratory effort.  7. RRR, No Gallops, Rubs or Murmurs, No Parasternal Heave.  8. Positive Bowel Sounds, Abdomen Soft, Non tender, No organomegaly appriciated,No rebound -guarding or rigidity.  9.  No Cyanosis, Normal Skin Turgor, No Skin Rash or Bruise.    Data Review  CBC Recent Labs  Lab 08/28/18 1442  WBC 10.0  HGB 14.6  HCT 45.8  PLT 183  MCV 88.6  MCH 28.2  MCHC 31.9  RDW 14.2  LYMPHSABS 1.4  MONOABS 1.0  EOSABS 0.0  BASOSABS 0.0   ------------------------------------------------------------------------------------------------------------------  Chemistries  Recent Labs  Lab 08/28/18 1442  NA 138  K 3.3*  CL 100  CO2 24  GLUCOSE 127*  BUN 18  CREATININE 0.92  CALCIUM 10.0  AST 55*  ALT 23  ALKPHOS 42  BILITOT 0.7   ------------------------------------------------------------------------------------------------------------------ estimated creatinine clearance is 33.5 mL/min (by C-G formula based on SCr of 0.92 mg/dL). ------------------------------------------------------------------------------------------------------------------ No results for input(s): TSH, T4TOTAL, T3FREE, THYROIDAB in the last 72 hours.  Invalid input(s): FREET3   Coagulation profile No results for input(s): INR, PROTIME in the last 168 hours. ------------------------------------------------------------------------------------------------------------------- No results for input(s): DDIMER in the last 72 hours. -------------------------------------------------------------------------------------------------------------------  Cardiac  Enzymes No results for input(s): CKMB, TROPONINI, MYOGLOBIN in the last 168 hours.  Invalid input(s): CK ------------------------------------------------------------------------------------------------------------------ Invalid input(s): POCBNP   ---------------------------------------------------------------------------------------------------------------  Urinalysis  Component Value Date/Time   COLORURINE YELLOW 08/01/2018 1438   APPEARANCEUR CLOUDY (A) 08/01/2018 1438   LABSPEC 1.013 08/01/2018 1438   PHURINE 8.0 08/01/2018 1438   GLUCOSEU NEGATIVE 08/01/2018 1438   HGBUR NEGATIVE 08/01/2018 1438   BILIRUBINUR NEGATIVE 08/01/2018 1438   KETONESUR NEGATIVE 08/01/2018 1438   PROTEINUR NEGATIVE 08/01/2018 1438   NITRITE NEGATIVE 08/01/2018 1438   LEUKOCYTESUR TRACE (A) 08/01/2018 1438    ----------------------------------------------------------------------------------------------------------------   Imaging results:   Ct Head Wo Contrast  Result Date: 08/28/2018 CLINICAL DATA:  Found on floor at independent living facility by family, patient does remember falling, history coronary artery disease post MI, hypertension, seizures, EXAM: CT HEAD WITHOUT CONTRAST CT CERVICAL SPINE WITHOUT CONTRAST TECHNIQUE: Multidetector CT imaging of the head and cervical spine was performed following the standard protocol without intravenous contrast. Multiplanar CT image reconstructions of the cervical spine were also generated. COMPARISON:  12/12/2017 FINDINGS: CT HEAD FINDINGS Brain: Generalized atrophy. Normal ventricular morphology. No midline shift or mass effect. Small vessel chronic ischemic changes of deep cerebral white matter. No intracranial hemorrhage, mass lesion, evidence of acute infarction, or extra-axial fluid collection. Vascular: Atherosclerotic calcifications of internal carotid and vertebral arteries at skull base Skull: Intact Sinuses/Orbits: Mucosal retention cyst RIGHT  maxillary sinus. Otherwise clear. Other: N/A CT CERVICAL SPINE FINDINGS Alignment: Normal Skull base and vertebrae: Osseous demineralization. Visualized skull base intact. Prior anterior fusion C5-C7. Multilevel degenerative disc disease changes with disc space narrowing and endplate spur formation. Multilevel facet degenerative changes. Vertebral body heights maintained without fracture or subluxation. Minimal scattered motion artifacts. Endplate spurring at C6-C7 causes AP narrowing of the spinal canal and probable cord compression greater LEFT of midline. Hardware appears intact. Soft tissues and spinal canal: Prevertebral soft tissues normal thickness. Atherosclerotic calcifications at carotid bifurcations. Remaining cervical soft tissues unremarkable. Disc levels:  As above Upper chest: Lung apices clear Other: N/A IMPRESSION: Mild atrophy with small vessel chronic ischemic changes of deep cerebral white matter. No acute intracranial abnormalities. Degenerative disc and facet disease changes of the cervical spine. Prior anterior cervical fusion C5-C7. No acute cervical spine abnormalities. Electronically Signed   By: Ulyses Southward M.D.   On: 08/28/2018 15:57   Ct Cervical Spine Wo Contrast  Result Date: 08/28/2018 CLINICAL DATA:  Found on floor at independent living facility by family, patient does remember falling, history coronary artery disease post MI, hypertension, seizures, EXAM: CT HEAD WITHOUT CONTRAST CT CERVICAL SPINE WITHOUT CONTRAST TECHNIQUE: Multidetector CT imaging of the head and cervical spine was performed following the standard protocol without intravenous contrast. Multiplanar CT image reconstructions of the cervical spine were also generated. COMPARISON:  12/12/2017 FINDINGS: CT HEAD FINDINGS Brain: Generalized atrophy. Normal ventricular morphology. No midline shift or mass effect. Small vessel chronic ischemic changes of deep cerebral white matter. No intracranial hemorrhage, mass  lesion, evidence of acute infarction, or extra-axial fluid collection. Vascular: Atherosclerotic calcifications of internal carotid and vertebral arteries at skull base Skull: Intact Sinuses/Orbits: Mucosal retention cyst RIGHT maxillary sinus. Otherwise clear. Other: N/A CT CERVICAL SPINE FINDINGS Alignment: Normal Skull base and vertebrae: Osseous demineralization. Visualized skull base intact. Prior anterior fusion C5-C7. Multilevel degenerative disc disease changes with disc space narrowing and endplate spur formation. Multilevel facet degenerative changes. Vertebral body heights maintained without fracture or subluxation. Minimal scattered motion artifacts. Endplate spurring at C6-C7 causes AP narrowing of the spinal canal and probable cord compression greater LEFT of midline. Hardware appears intact. Soft tissues and spinal canal: Prevertebral soft tissues normal thickness.  Atherosclerotic calcifications at carotid bifurcations. Remaining cervical soft tissues unremarkable. Disc levels:  As above Upper chest: Lung apices clear Other: N/A IMPRESSION: Mild atrophy with small vessel chronic ischemic changes of deep cerebral white matter. No acute intracranial abnormalities. Degenerative disc and facet disease changes of the cervical spine. Prior anterior cervical fusion C5-C7. No acute cervical spine abnormalities. Electronically Signed   By: Ulyses Southward M.D.   On: 08/28/2018 15:57   Dg Chest Port 1 View  Result Date: 08/28/2018 CLINICAL DATA:  Shortness of breath. EXAM: PORTABLE CHEST 1 VIEW COMPARISON:  April 27, 2017 FINDINGS: No pneumothorax. A transcutaneous pacer overlies left-sided chest. The heart, hila, mediastinum, lungs, and pleura are unremarkable. Minimal atelectasis in the left base. IMPRESSION: No active disease. Electronically Signed   By: Gerome Sam III M.D   On: 08/28/2018 15:07    My personal review of EKG: Normal sinus rhythm at 62 bpm with LBBB and LVH  Assessment &  Plan  Altered mental status , CT of head unremarkable probably cardiogenic Serial troponins and consult cardiology if needed Check EKG in a.m. Neurochecks Continue with Imdur   Acute coronary syndrome Serial troponins Check echocardiogram  Hypertension Continue with Lopressor  Hyperlipidemia Continue Zocor  Bradycardia Resolved.  Status post external pacemaker by EMS   DVT Prophylaxis Lovenox  AM Labs Ordered, also please review Full Orders  Family Communication: Admission, patients condition and plan of care including tests being ordered have been discussed with the son who indicates understanding and agree with the plan and Code Status.  Code Status DNR  Disposition Plan: To be determined  Time spent in minutes : 49 minutes  Condition critical   @

## 2018-08-29 DIAGNOSIS — M6282 Rhabdomyolysis: Secondary | ICD-10-CM

## 2018-08-29 DIAGNOSIS — R296 Repeated falls: Secondary | ICD-10-CM

## 2018-08-29 MED ORDER — CLOPIDOGREL BISULFATE 75 MG PO TABS: 75.0000 mg | ORAL_TABLET | Freq: Every day | ORAL | Status: DC

## 2018-08-29 MED ORDER — BACLOFEN 5 MG HALF TABLET
30.0000 mg | ORAL_TABLET | Freq: Two times a day (BID) | ORAL | Status: DC
  Administered 2018-08-29 – 2018-09-01 (×7): 30 mg via ORAL
  Filled 2018-08-29 (×5): qty 2

## 2018-08-29 MED ORDER — BACLOFEN 20 MG PO TABS: 20.0000 mg | ORAL_TABLET | ORAL | Status: DC

## 2018-08-29 MED ORDER — GABAPENTIN 400 MG PO CAPS
400.0000 mg | ORAL_CAPSULE | Freq: Four times a day (QID) | ORAL | Status: DC
  Administered 2018-08-29 – 2018-09-01 (×13): 400 mg via ORAL
  Filled 2018-08-29 (×13): qty 1

## 2018-08-29 MED ORDER — TRIAMTERENE-HCTZ 37.5-25 MG PO TABS: 1.0000 | ORAL_TABLET | Freq: Every day | ORAL | Status: DC

## 2018-08-29 MED ORDER — LEVOTHYROXINE SODIUM 75 MCG PO TABS: 75.0000 ug | ORAL_TABLET | Freq: Every day | ORAL | Status: DC

## 2018-08-29 MED ORDER — SIMVASTATIN 20 MG PO TABS: 20.0000 mg | ORAL_TABLET | Freq: Every evening | ORAL | Status: DC

## 2018-08-29 MED ORDER — BACLOFEN 20 MG PO TABS
20.0000 mg | ORAL_TABLET | Freq: Every day | ORAL | Status: DC
  Administered 2018-08-29 – 2018-09-01 (×4): 20 mg via ORAL
  Filled 2018-08-29 (×6): qty 1

## 2018-08-29 MED ORDER — FAMOTIDINE 20 MG PO TABS: 10.0000 mg | ORAL_TABLET | Freq: Every day | ORAL | Status: DC

## 2018-08-29 MED ORDER — AMLODIPINE BESYLATE 2.5 MG PO TABS: 2.5000 mg | ORAL_TABLET | Freq: Every day | ORAL | Status: DC

## 2018-08-29 MED ORDER — ASPIRIN EC 81 MG PO TBEC: 81.0000 mg | DELAYED_RELEASE_TABLET | Freq: Every day | ORAL | Status: DC

## 2018-08-29 MED ORDER — ISOSORBIDE MONONITRATE ER 60 MG PO TB24: 60.0000 mg | ORAL_TABLET | Freq: Every day | ORAL | Status: DC

## 2018-08-29 NOTE — Evaluation (Signed)
Physical Therapy Evaluation Patient Details Name: Kimberly Bass MRN: [id number] DOB: [birth year] Today's Date: 08/29/2018   History of Present Illness  Pt is a 85 y.o. female admitted 08/28/18 after falling at home and being found down next morning by son; bradycardic with EMS transport requiring external pacemaker. Head and cervical CT negative for acute abnormality. Worked up for mild rhabdomyolysis and acute metabolic encephalopathy. PMH includes HTN, CAD, TIA.    Clinical Impression  Pt presents with an overall decrease in functional mobility secondary to above. PTA, pt mod indep with rollator and lives alone, multiple recent falls; supportive family lives nearby. Today, pt required min-modA to stand and initiate gait training with RW; limited by c/o L knee pain with weightbearing. Family present to discuss recommendation for SNF-level therapies; son unsure if they will do SNF or have pt return home with 24/7 support. Will follow acutely to address established goals.     Follow Up Recommendations SNF;Supervision/Assistance - 24 hour    Equipment Recommendations  3in1 (PT);Rolling walker with 5" wheels    Recommendations for Other Services       Precautions / Restrictions Precautions Precautions: Fall Restrictions Weight Bearing Restrictions: No      Mobility  Bed Mobility Overal bed mobility: Needs Assistance Bed Mobility: Supine to Sit     Supine to sit: Mod assist;HOB elevated     General bed mobility comments: Cues to initiate movement, increased time and effort; ModA for UE support to assist trunk elevation and scoot hips to EOB  Transfers Overall transfer level: Needs assistance Equipment used: Rolling walker (2 wheeled) Transfers: Sit to/from Stand Sit to Stand: Min assist         General transfer comment: MinA to assist trunk elevation and cue anterior weight translation away from leaning legs on bed; pt c/o L knee pain upon  standing  Ambulation/Gait Ambulation/Gait assistance: Min assist;Mod assist Gait Distance (Feet): 1 Feet Assistive device: Rolling walker (2 wheeled) Gait Pattern/deviations: Step-to pattern;Antalgic;Decreased weight shift to right;Trunk flexed;Leaning posteriorly Gait velocity: Decreased   General Gait Details: Took side steps at EOB with RW and min-modA to prevent posterior LOB; pt required max verbal cues for sequencing and assist to move RW sideways, likely internally distracted by L knee pain  Stairs            Wheelchair Mobility    Modified Rankin (Stroke Patients Only)       Balance Overall balance assessment: Needs assistance   Sitting balance-Leahy Scale: Fair       Standing balance-Leahy Scale: Poor Standing balance comment: Reliant on UE support and external assist                             Pertinent Vitals/Pain Pain Assessment: Faces Faces Pain Scale: Hurts even more Pain Location: L knee with weightbearing Pain Descriptors / Indicators: Grimacing;Guarding;Sore Pain Intervention(s): Limited activity within patient's tolerance;Monitored during session    Home Living Family/patient expects to be discharged to:: Private residence Living Arrangements: Alone Available Help at Discharge: Family;Available 24 hours/day Type of Home: Independent living facility([facility] apartment complex) Home Access: Elevator     Home Layout: One level Home Equipment: Grab bars - toilet;Grab bars - tub/shower;Walker - 4 wheels      Prior Function Level of Independence: Independent with assistive device(s)         Comments: Ambulatory with rollator. Has life alert necklace. Three recent falls PTA     Hand  Dominance        Extremity/Trunk Assessment   Upper Extremity Assessment Upper Extremity Assessment: Overall WFL for tasks assessed    Lower Extremity Assessment Lower Extremity Assessment: Generalized weakness;LLE deficits/detail LLE  Deficits / Details: C/o L knee pain with WB; bandage on knee which pt reports due to scrapes from falling. Able to extend fully, decreased flexion secondary to pain       Communication      Cognition Arousal/Alertness: Awake/alert Behavior During Therapy: WFL for tasks assessed/performed Overall Cognitive Status: Impaired/Different from baseline Area of Impairment: Attention;Following commands;Safety/judgement;Awareness;Problem solving                   Current Attention Level: Selective   Following Commands: Follows one step commands with increased time;Follows multi-step commands inconsistently Safety/Judgement: Decreased awareness of deficits;Decreased awareness of safety Awareness: Emergent Problem Solving: Slow processing;Requires verbal cues General Comments: A&Ox4. Generally slow to respond and follow commands, but doing so appropriately. Decreased attention; distracted by pain      General Comments General comments (skin integrity, edema, etc.): Multiple family members present (including son); discussed recommendation for SNF-level therapies. Son wants to wait to see how pt is doing, may plan to have her return home with 24/7 assist     Exercises     Assessment/Plan    PT Assessment Patient needs continued PT services  PT Problem List Decreased strength;Decreased activity tolerance;Decreased balance;Decreased mobility;Decreased cognition;Decreased knowledge of use of DME;Decreased safety awareness;Pain       PT Treatment Interventions DME instruction;Gait training;Stair training;Functional mobility training;Therapeutic activities;Therapeutic exercise;Balance training;Patient/family education    PT Goals (Current goals can be found in the Care Plan section)  Acute Rehab PT Goals Patient Stated Goal: Be able to return home PT Goal Formulation: With patient/family Time For Goal Achievement: 09/12/18 Potential to Achieve Goals: Fair    Frequency Min 3X/week    Barriers to discharge        Co-evaluation               AM-PAC PT "6 Clicks" Mobility  Outcome Measure Help needed turning from your back to your side while in a flat bed without using bedrails?: A Little Help needed moving from lying on your back to sitting on the side of a flat bed without using bedrails?: A Lot Help needed moving to and from a bed to a chair (including a wheelchair)?: A Little Help needed standing up from a chair using your arms (e.g., wheelchair or bedside chair)?: A Little Help needed to walk in hospital room?: A Lot Help needed climbing 3-5 steps with a railing? : Total 6 Click Score: 14    End of Session Equipment Utilized During Treatment: Gait belt Activity Tolerance: Patient limited by pain Patient left: in bed;with call bell/phone within reach;with bed alarm set;with family/visitor present Nurse Communication: Mobility status PT Visit Diagnosis: Other abnormalities of gait and mobility (R26.89);Muscle weakness (generalized) (M62.81)    Time: 3267-1245 PT Time Calculation (min) (ACUTE ONLY): 28 min   Charges:   PT Evaluation $PT Eval Moderate Complexity: 1 Mod PT Treatments $Self Care/Home Management: 8-22      Ina Homes, PT, DPT Acute Rehabilitation Services  Pager [phone] Office [phone]  Malachy Chamber 08/29/2018, 3:54 PM

## 2018-08-29 NOTE — Plan of Care (Signed)
  Problem: Pain Managment: Goal: General experience of comfort will improve Outcome: Progressing   Problem: Safety: Goal: Ability to remain free from injury will improve Outcome: Progressing   Problem: Skin Integrity: Goal: Risk for impaired skin integrity will decrease Outcome: Progressing   

## 2018-08-29 NOTE — Progress Notes (Addendum)
PROGRESS NOTE    Kimberly Bass  BDH:[id number] DOB: 02-22-1926 DOA: 08/28/2018 PCP: Merri Brunette, MD     Brief Narrative:  Kimberly Bass is a 85 year old female at home with past medical history significant for CAD, hypertension, hyperlipidemia, hypothyroidism, previous TIA who presented to the hospital after being found on the floor at home.  Apparently, patient went up to go use the bathroom around 10:51 PM, fell and could not get up.  Son found her on the floor the next morning.  During transportation by EMS, patient was found to be severely bradycardic, required external pacemaker on route.  In the emergency department, her bradycardia had resolved.  On admission, patient was quite obtunded, altered.  CK was elevated as well as troponin.  She was admitted for acute encephalopathy, elevated troponin concern for acute coronary syndrome.  New events last 24 hours / Subjective: Overnight, patient had acute decompensation with worsening mentation as well as hypotension.  At that time, son had decided to transition patient to comfort measures.  On my examination this morning, patient is alert, oriented to self, place although not to current president.  She is able to recall the events that brought her to the hospital, states that she was getting up to the bathroom and fell.  She believes she lost consciousness but cannot recall any prodromal symptoms.  Son is at bedside, states that she looks much better this morning than she did last night.  We discussed goals of care, patient and son are not interested in pursuing any further aggressive measures including telemetry monitoring or lab work.  We decided to resume some of her medications including baclofen and gabapentin which help with her neuropathic pain and have patient be evaluated by physical therapy to see if she could potentially be discharged home.  Son does note that patient fell numerous times prior to EMS arrival.  Currently, she  denies any chest pain, shortness of breath, no further vomiting since ED, no dysuria.   Assessment & Plan:   Active Problems:   CAD (coronary artery disease)   Hypothyroidism   Unwitnessed fall   Essential hypertension   Acute metabolic encephalopathy   Rhabdomyolysis  Unwitnessed fall at home with mild rhabdomyolysis -Unclear if this was a syncopal episode versus a mechanical fall versus seizure.  Patient did have acute encephalopathy on admission -Patient and son declined further work-up or any aggressive treatment -PT OT.  Patient currently resides at home by herself, unclear if she would be able to be discharged back to her prior to admission home setting  Acute metabolic encephalopathy -Seems to be resolved back to baseline  Elevated troponin -Patient denies any chest pain -Patient and family declining further work-up  Essential hypertension -Holding her home antihypertensive medication due to hypotension overnight as well as bradycardia on admission    DVT prophylaxis: None Code Status: DNR, confirmed with patient and son Family Communication: Son at bedside Disposition Plan: Pending further evaluation by PT OT.  Possible that she could be discharged to SNF with hospice versus home with home hospice. If patient decompensates further, will transition to comfort care.    Consultants:   None  Procedures:   None  Antimicrobials:  Anti-infectives (From admission, onward)   None        Objective: Vitals:   08/28/18 1828 08/28/18 1945 08/29/18 0300 08/29/18 1212  BP: 109/67 (!) 96/38  (!) 127/55  Pulse: (!) 57 (!) 58  93  Resp:  16  Temp: 97.7 F (36.5 C) 98 F (36.7 C)  98.2 F (36.8 C)  TempSrc: Oral Oral  Oral  SpO2: 100% 100%  92%  Weight:   68.8 kg   Height:        Intake/Output Summary (Last 24 hours) at 08/29/2018 1234 Last data filed at 08/29/2018 0916 Gross per 24 hour  Intake 1200 ml  Output -  Net 1200 ml   Filed Weights   08/28/18  1502 08/29/18 0300  Weight: 65.8 kg 68.8 kg    Examination:  General exam: Appears calm and comfortable  Respiratory system: Clear to auscultation. Respiratory effort normal. On [place] O2  Cardiovascular system: S1 & S2 heard, RRR. No JVD, murmurs, rubs, gallops or clicks. No pedal edema.  Gastrointestinal system: Abdomen is nondistended, soft and nontender. No organomegaly or masses felt. Normal bowel sounds heard. Central nervous system: Alert and oriented to self, place, not to current president  Extremities: Symmetric  Skin: No rashes, lesions or ulcers Psychiatry: Judgement and insight appear stable   Data Reviewed: I have personally reviewed following labs and imaging studies  CBC: Recent Labs  Lab 08/28/18 1442  WBC 10.0  NEUTROABS 7.5  HGB 14.6  HCT 45.8  MCV 88.6  PLT 183   Basic Metabolic Panel: Recent Labs  Lab 08/28/18 1442  NA 138  K 3.3*  CL 100  CO2 24  GLUCOSE 127*  BUN 18  CREATININE 0.92  CALCIUM 10.0   GFR: Estimated Creatinine Clearance: 34.3 mL/min (by C-G formula based on SCr of 0.92 mg/dL). Liver Function Tests: Recent Labs  Lab 08/28/18 1442  AST 55*  ALT 23  ALKPHOS 42  BILITOT 0.7  PROT 6.8  ALBUMIN 3.8   No results for input(s): LIPASE, AMYLASE in the last 168 hours. No results for input(s): AMMONIA in the last 168 hours. Coagulation Profile: Recent Labs  Lab 08/28/18 1921  INR 1.0   Cardiac Enzymes: Recent Labs  Lab 08/28/18 1504  CKTOTAL 1,438*   BNP (last 3 results) No results for input(s): PROBNP in the last 8760 hours. HbA1C: No results for input(s): HGBA1C in the last 72 hours. CBG: No results for input(s): GLUCAP in the last 168 hours. Lipid Profile: No results for input(s): CHOL, HDL, LDLCALC, TRIG, CHOLHDL, LDLDIRECT in the last 72 hours. Thyroid Function Tests: No results for input(s): TSH, T4TOTAL, FREET4, T3FREE, THYROIDAB in the last 72 hours. Anemia Panel: No results for input(s): VITAMINB12, FOLATE,  FERRITIN, TIBC, IRON, RETICCTPCT in the last 72 hours. Sepsis Labs: No results for input(s): PROCALCITON, LATICACIDVEN in the last 168 hours.  No results found for this or any previous visit (from the past 240 hour(s)).     Radiology Studies: Ct Head Wo Contrast  Result Date: 08/28/2018 CLINICAL DATA:  Found on floor at independent living facility by family, patient does remember falling, history coronary artery disease post MI, hypertension, seizures, EXAM: CT HEAD WITHOUT CONTRAST CT CERVICAL SPINE WITHOUT CONTRAST TECHNIQUE: Multidetector CT imaging of the head and cervical spine was performed following the standard protocol without intravenous contrast. Multiplanar CT image reconstructions of the cervical spine were also generated. COMPARISON:  12/12/2017 FINDINGS: CT HEAD FINDINGS Brain: Generalized atrophy. Normal ventricular morphology. No midline shift or mass effect. Small vessel chronic ischemic changes of deep cerebral white matter. No intracranial hemorrhage, mass lesion, evidence of acute infarction, or extra-axial fluid collection. Vascular: Atherosclerotic calcifications of internal carotid and vertebral arteries at skull base Skull: Intact Sinuses/Orbits: Mucosal retention cyst RIGHT maxillary sinus.  Otherwise clear. Other: N/A CT CERVICAL SPINE FINDINGS Alignment: Normal Skull base and vertebrae: Osseous demineralization. Visualized skull base intact. Prior anterior fusion C5-C7. Multilevel degenerative disc disease changes with disc space narrowing and endplate spur formation. Multilevel facet degenerative changes. Vertebral body heights maintained without fracture or subluxation. Minimal scattered motion artifacts. Endplate spurring at C6-C7 causes AP narrowing of the spinal canal and probable cord compression greater LEFT of midline. Hardware appears intact. Soft tissues and spinal canal: Prevertebral soft tissues normal thickness. Atherosclerotic calcifications at carotid bifurcations.  Remaining cervical soft tissues unremarkable. Disc levels:  As above Upper chest: Lung apices clear Other: N/A IMPRESSION: Mild atrophy with small vessel chronic ischemic changes of deep cerebral white matter. No acute intracranial abnormalities. Degenerative disc and facet disease changes of the cervical spine. Prior anterior cervical fusion C5-C7. No acute cervical spine abnormalities. Electronically Signed   By: Ulyses Southward M.D.   On: 08/28/2018 15:57   Ct Cervical Spine Wo Contrast  Result Date: 08/28/2018 CLINICAL DATA:  Found on floor at independent living facility by family, patient does remember falling, history coronary artery disease post MI, hypertension, seizures, EXAM: CT HEAD WITHOUT CONTRAST CT CERVICAL SPINE WITHOUT CONTRAST TECHNIQUE: Multidetector CT imaging of the head and cervical spine was performed following the standard protocol without intravenous contrast. Multiplanar CT image reconstructions of the cervical spine were also generated. COMPARISON:  12/12/2017 FINDINGS: CT HEAD FINDINGS Brain: Generalized atrophy. Normal ventricular morphology. No midline shift or mass effect. Small vessel chronic ischemic changes of deep cerebral white matter. No intracranial hemorrhage, mass lesion, evidence of acute infarction, or extra-axial fluid collection. Vascular: Atherosclerotic calcifications of internal carotid and vertebral arteries at skull base Skull: Intact Sinuses/Orbits: Mucosal retention cyst RIGHT maxillary sinus. Otherwise clear. Other: N/A CT CERVICAL SPINE FINDINGS Alignment: Normal Skull base and vertebrae: Osseous demineralization. Visualized skull base intact. Prior anterior fusion C5-C7. Multilevel degenerative disc disease changes with disc space narrowing and endplate spur formation. Multilevel facet degenerative changes. Vertebral body heights maintained without fracture or subluxation. Minimal scattered motion artifacts. Endplate spurring at C6-C7 causes AP narrowing of the  spinal canal and probable cord compression greater LEFT of midline. Hardware appears intact. Soft tissues and spinal canal: Prevertebral soft tissues normal thickness. Atherosclerotic calcifications at carotid bifurcations. Remaining cervical soft tissues unremarkable. Disc levels:  As above Upper chest: Lung apices clear Other: N/A IMPRESSION: Mild atrophy with small vessel chronic ischemic changes of deep cerebral white matter. No acute intracranial abnormalities. Degenerative disc and facet disease changes of the cervical spine. Prior anterior cervical fusion C5-C7. No acute cervical spine abnormalities. Electronically Signed   By: Ulyses Southward M.D.   On: 08/28/2018 15:57   Dg Chest Port 1 View  Result Date: 08/28/2018 CLINICAL DATA:  Shortness of breath. EXAM: PORTABLE CHEST 1 VIEW COMPARISON:  April 27, 2017 FINDINGS: No pneumothorax. A transcutaneous pacer overlies left-sided chest. The heart, hila, mediastinum, lungs, and pleura are unremarkable. Minimal atelectasis in the left base. IMPRESSION: No active disease. Electronically Signed   By: Gerome Sam III M.D   On: 08/28/2018 15:07      Scheduled Meds: . baclofen  30 mg Oral BID   And  . baclofen  20 mg Oral Q1400  . gabapentin  400 mg Oral QID   Continuous Infusions:   LOS: 1 day    Time spent: 35 minutes   Noralee Stain, DO [facility] [email or web address] 08/29/2018, 12:34 PM

## 2018-08-30 ENCOUNTER — Inpatient Hospital Stay (HOSPITAL_COMMUNITY): Payer: Medicare Other

## 2018-08-30 DIAGNOSIS — R001 Bradycardia, unspecified: Secondary | ICD-10-CM

## 2018-08-30 DIAGNOSIS — G9341 Metabolic encephalopathy: Secondary | ICD-10-CM

## 2018-08-30 DIAGNOSIS — R778 Other specified abnormalities of plasma proteins: Secondary | ICD-10-CM

## 2018-08-30 DIAGNOSIS — R55 Syncope and collapse: Secondary | ICD-10-CM

## 2018-08-30 DIAGNOSIS — E039 Hypothyroidism, unspecified: Secondary | ICD-10-CM

## 2018-08-30 DIAGNOSIS — R5383 Other fatigue: Secondary | ICD-10-CM

## 2018-08-30 DIAGNOSIS — T796XXA Traumatic ischemia of muscle, initial encounter: Secondary | ICD-10-CM

## 2018-08-30 DIAGNOSIS — R7989 Other specified abnormal findings of blood chemistry: Secondary | ICD-10-CM

## 2018-08-30 LAB — CK: Total CK: 364 U/L — ABNORMAL HIGH (ref 38–234)

## 2018-08-30 LAB — ECHOCARDIOGRAM COMPLETE
Height: 59 in
Weight: 2451.52 oz

## 2018-08-30 LAB — MAGNESIUM: Magnesium: 1.6 mg/dL — ABNORMAL LOW (ref 1.7–2.4)

## 2018-08-30 LAB — BASIC METABOLIC PANEL
Anion gap: 12 (ref 5–15)
BUN: 13 mg/dL (ref 8–23)
CALCIUM: 8.8 mg/dL — AB (ref 8.9–10.3)
CO2: 28 mmol/L (ref 22–32)
Chloride: 97 mmol/L — ABNORMAL LOW (ref 98–111)
Creatinine, Ser: 0.88 mg/dL (ref 0.44–1.00)
GFR calc Af Amer: >60 mL/min (ref >60)
GFR calc non Af Amer: 58 mL/min — ABNORMAL LOW (ref >60)
Glucose, Bld: 162 mg/dL — ABNORMAL HIGH (ref 70–99)
Potassium: 3.1 mmol/L — ABNORMAL LOW (ref 3.5–5.1)
Sodium: 137 mmol/L (ref 135–145)

## 2018-08-30 LAB — TROPONIN I: Troponin I: 0.03 ng/mL (ref <0.03)

## 2018-08-30 MED ORDER — MIRABEGRON ER 25 MG PO TB24
25.0000 mg | ORAL_TABLET | Freq: Every day | ORAL | Status: DC
  Administered 2018-08-30 – 2018-09-01 (×3): 25 mg via ORAL
  Filled 2018-08-30 (×3): qty 1

## 2018-08-30 MED ORDER — CLOPIDOGREL BISULFATE 75 MG PO TABS
75.0000 mg | ORAL_TABLET | Freq: Every day | ORAL | Status: DC
  Administered 2018-08-30 – 2018-09-01 (×3): 75 mg via ORAL
  Filled 2018-08-30 (×3): qty 1

## 2018-08-30 MED ORDER — POTASSIUM CHLORIDE CRYS ER 20 MEQ PO TBCR
40.0000 meq | EXTENDED_RELEASE_TABLET | ORAL | Status: AC
  Administered 2018-08-30 (×2): 40 meq via ORAL
  Filled 2018-08-30: qty 2

## 2018-08-30 MED ORDER — MAGNESIUM SULFATE 2 GM/50ML IV SOLN
2.0000 g | Freq: Once | INTRAVENOUS | Status: AC
  Administered 2018-08-30: 2 g via INTRAVENOUS
  Filled 2018-08-30: qty 50

## 2018-08-30 MED ORDER — ASPIRIN EC 81 MG PO TBEC
81.0000 mg | DELAYED_RELEASE_TABLET | Freq: Every day | ORAL | Status: DC
  Administered 2018-08-30 – 2018-08-31 (×2): 81 mg via ORAL
  Filled 2018-08-30 (×2): qty 1

## 2018-08-30 MED ORDER — FAMOTIDINE 20 MG PO TABS
10.0000 mg | ORAL_TABLET | Freq: Every day | ORAL | Status: DC
  Administered 2018-08-30 – 2018-09-01 (×3): 10 mg via ORAL
  Filled 2018-08-30 (×3): qty 1

## 2018-08-30 NOTE — Clinical Social Work Note (Signed)
Clinical Social Work Assessment  Patient Details  Name: Kimberly Bass MRN: [id number] Date of Birth: Feb 12, 1926  Date of referral:  08/30/18               Reason for consult:  Facility Placement, Discharge Planning                Permission sought to share information with:  Facility Sport and exercise psychologist, Family Supports Permission granted to share information::  Yes, Verbal Permission Granted  Name::     Governor [place]  Agency::  SNF's  Relationship::  Son  Contact Information:  [phone]  Housing/Transportation Living arrangements for the past 2 months:  [place] of Information:  Patient, Medical Team, Adult Children Patient Interpreter Needed:  None Criminal Activity/Legal Involvement Pertinent to Current Situation/Hospitalization:  No - Comment as needed Significant Relationships:  Adult Children Lives with:  Self Do you feel safe going back to the place where you live?  Yes Need for family participation in patient care:  Yes (Comment)  Care giving concerns:  PT recommending SNF once medically stable for discharge.   Social Worker assessment / plan:  CSW met with patient. Son at bedside. CSW introduced role and explained that PT recommendations would be discussed. Patient and son are agreeable to SNF placement. No facility preference. Provided CMS Medicare scores for facilities within 25 miles of her zip code. Son stated he does not want her to go to [facility]. She was there for rehab in June 2019. No further concerns. CSW encouraged patient and her son to contact CSW as needed. CSW will continue to follow patient and her son for support and facilitate discharge to SNF once medically stable.  Employment status:  Retired Nurse, adult PT Recommendations:  [place] / Referral to community resources:  [place]  Patient/Family's Response to care:  Patient and her son  agreeable to SNF placement. Patient's children supportive and involved in patient's care. Patient and her son appreciated social work intervention.  Patient/Family's Understanding of and Emotional Response to Diagnosis, Current Treatment, and Prognosis:  Patient and her son have a good understanding of the reason for admission and her need for rehab prior to returning to [place]. Patient and her son appear happy with hospital care.  Emotional Assessment Appearance:  Appears stated age Attitude/Demeanor/Rapport:  Engaged, Gracious Affect (typically observed):  Accepting, Appropriate, Calm, Pleasant Orientation:  Oriented to Self, Oriented to Place, Oriented to  Time, Oriented to Situation Alcohol / Substance use:  Never Used Psych involvement (Current and /or in the community):  No (Comment)  Discharge Needs  Concerns to be addressed:  Care Coordination Readmission within the last 30 days:  Yes Current discharge risk:  Dependent with Mobility, Lives alone Barriers to Discharge:  Continued Medical Work up, [place], LCSW 08/30/2018, 12:25 PM

## 2018-08-30 NOTE — Progress Notes (Signed)
Critical Trop 0.03 MD aware.

## 2018-08-30 NOTE — Evaluation (Signed)
Occupational Therapy Evaluation Patient Details Name: Kimberly Bass MRN: [id number] DOB: 08-04-1925 Today's Date: 08/30/2018    History of Present Illness Pt is a 85 y.o. female admitted 08/28/18 after falling at home and being found down next morning by son; bradycardic with EMS transport requiring external pacemaker. Head and cervical CT negative for acute abnormality. Worked up for mild rhabdomyolysis and acute metabolic encephalopathy. PMH includes HTN, CAD, TIA.   Clinical Impression   Pt lived alone in an ILF with assist for housekeeping and transportation. She ambulated with a rollator and was modified independent in ADL and simple meal prep. Pt presents with L knee pain limiting all mobility and LB ADL. She demonstrates some cognitive deficits. Pt requires set up to total assist for ADL and needs 2 person assist for OOB. Pt with HR in 130s-140s with sitting EOB. Recommending ST rehab in SNF. Will follow acutely    Follow Up Recommendations  SNF;Supervision/Assistance - 24 hour    Equipment Recommendations  3 in 1 bedside commode, wheelchair, wheelchair cushion    Recommendations for Other Services       Precautions / Restrictions Precautions Precautions: Fall Restrictions Weight Bearing Restrictions: No      Mobility Bed Mobility Overal bed mobility: Needs Assistance Bed Mobility: Supine to Sit     Supine to sit: Mod assist;HOB elevated     General bed mobility comments: Cues to initiate movement, increased time and effort grimacing/moaning due to LLE pain throughout; ModA for UE support to assist trunk elevation and scoot hips to EOB  Transfers Overall transfer level: Needs assistance Equipment used: Rolling walker (2 wheeled);2 person hand held assist Transfers: Sit to/from [employer] Sit to Stand: Mod assist;+2 physical assistance   Squat pivot transfers: Mod assist;+2 physical assistance     General transfer comment: ModA+2 to assist  trunk elevation standing from EOB to RW; pt unable to bear full weight through LLE despite assist, unable to take step. Transfer to recliner with modA+2 for squat pivot    Balance Overall balance assessment: Needs assistance   Sitting balance-Leahy Scale: Fair       Standing balance-Leahy Scale: Poor Standing balance comment: Reliant on UE support and external assist                           ADL either performed or assessed with clinical judgement   ADL Overall ADL's : Needs assistance/impaired Eating/Feeding: Independent;Sitting   Grooming: Set up;Sitting;Wash/dry hands;Wash/dry face;Oral care;Brushing hair   Upper Body Bathing: Minimal assistance;Sitting   Lower Body Bathing: Total assistance;+2 for physical assistance;Sit to/from stand   Upper Body Dressing : Minimal assistance;Sitting   Lower Body Dressing: +2 for physical assistance;Total assistance;Sit to/from stand   Toilet Transfer: +2 for physical assistance;Moderate assistance;Stand-pivot   Toileting- Clothing Manipulation and Hygiene: +2 for physical assistance;Total assistance;Sit to/from stand       Functional mobility during ADLs: (unable to ambulate)       Vision Baseline Vision/History: Wears glasses Additional Comments: glasses are at home     Perception     Praxis      Pertinent Vitals/Pain Pain Assessment: Faces Faces Pain Scale: Hurts whole lot Pain Location: L knee with all movement and weightbearing Pain Descriptors / Indicators: Grimacing;Guarding;Sore;Moaning Pain Intervention(s): Monitored during session;Repositioned     Hand Dominance Right   Extremity/Trunk Assessment Upper Extremity Assessment Upper Extremity Assessment: Generalized weakness   Lower Extremity Assessment Lower Extremity Assessment: Defer to PT  evaluation       Communication Communication Communication: No difficulties   Cognition Arousal/Alertness: Awake/alert Behavior During Therapy: WFL for  tasks assessed/performed Overall Cognitive Status: Impaired/Different from baseline Area of Impairment: Attention;Following commands;Safety/judgement;Awareness;Problem solving;Memory                   Current Attention Level: Selective Memory: Decreased short-term memory Following Commands: Follows one step commands with increased time;Follows multi-step commands inconsistently Safety/Judgement: Decreased awareness of deficits;Decreased awareness of safety Awareness: Emergent Problem Solving: Slow processing;Requires verbal cues General Comments: pt oriented, per son, she managed her meds and recalled her MD appointments without prompting   General Comments  Son present this session. SpO2 down to 88-94% on RA, 1L O2 [place] replaced; HR up to 127-148 sitting EOB    Exercises Other Exercises Other Exercises: AAROM LAQ; L knee flexion/heel slides limited to ~100' due to knee pain   Shoulder Instructions      Home Living Family/patient expects to be discharged to:: Private residence Living Arrangements: Alone Available Help at Discharge: Family;Available 24 hours/day Type of Home: Independent living facility([facility]) Home Access: Elevator     Home Layout: One level     Bathroom Shower/Tub: Chief Strategy Officer: Handicapped height     Home Equipment: Grab bars - toilet;Grab bars - tub/shower;Walker - 4 wheels          Prior Functioning/Environment Level of Independence: Independent with assistive device(s)        Comments: Ambulatory with rollator. Has life alert necklace. Three recent falls PTA        OT Problem List: Decreased strength;Decreased activity tolerance;Impaired balance (sitting and/or standing);Decreased cognition;Decreased safety awareness;Decreased knowledge of use of DME or AE;Pain;Cardiopulmonary status limiting activity      OT Treatment/Interventions: Self-care/ADL training;DME and/or AE instruction;Therapeutic  activities;Cognitive remediation/compensation;Balance training    OT Goals(Current goals can be found in the care plan section) Acute Rehab OT Goals Patient Stated Goal: Be able to return home OT Goal Formulation: With patient Time For Goal Achievement: 09/13/18 Potential to Achieve Goals: Good ADL Goals Pt Will Perform Lower Body Bathing: with mod assist;sit to/from stand Pt Will Perform Lower Body Dressing: with mod assist;sit to/from stand Pt Will Transfer to Toilet: with min assist;stand pivot transfer;bedside commode Pt Will Perform Toileting - Clothing Manipulation and hygiene: sit to/from stand;with mod assist Additional ADL Goal #1: Pt will perform bed mobility with min guard assist.  OT Frequency: Min 2X/week   Barriers to D/C: Decreased caregiver support          Co-evaluation PT/OT/SLP Co-Evaluation/Treatment: Yes Reason for Co-Treatment: For patient/therapist safety PT goals addressed during session: Mobility/safety with mobility;Balance OT goals addressed during session: ADL's and self-care      AM-PAC OT "6 Clicks" Daily Activity     Outcome Measure Help from another person eating meals?: None Help from another person taking care of personal grooming?: A Little Help from another person toileting, which includes using toliet, bedpan, or urinal?: Total Help from another person bathing (including washing, rinsing, drying)?: A Lot Help from another person to put on and taking off regular upper body clothing?: A Little Help from another person to put on and taking off regular lower body clothing?: Total 6 Click Score: 14   End of Session Equipment Utilized During Treatment: Gait belt;Rolling walker;Oxygen Nurse Communication: Mobility status  Activity Tolerance: Patient tolerated treatment well(HR in 130 to 140) Patient left: in chair;with call bell/phone within reach;with chair alarm set;with family/visitor present  OT Visit Diagnosis: Unsteadiness on feet  (R26.81);Pain;Muscle weakness (generalized) (M62.81);Other symptoms and signs involving cognitive function Pain - Right/Left: Left Pain - part of body: Knee                Time: 1125-1204 OT Time Calculation (min): 39 min Charges:  OT General Charges $OT Visit: 1 Visit OT Evaluation $OT Eval Moderate Complexity: 1 Mod OT Treatments $Self Care/Home Management : 8-22 mins  Martie Round, OTR/L Acute Rehabilitation Services Pager: [phone] Office: [phone]  Evern Bio 08/30/2018, 2:21 PM

## 2018-08-30 NOTE — Progress Notes (Signed)
PROGRESS NOTE    KHAMIL Bass   ZOX:[id number]  DOB: 03-15-1926  DOA: 08/28/2018 PCP: Merri Brunette, MD   Brief Narrative:  Kimberly Bass is a 85 year old female at home with past medical history significant for CAD, hypertension, hyperlipidemia, hypothyroidism, previous TIA who presented to the hospital after being found on the floor at home.  Apparently, patient went up to go use the bathroom around 10:51 PM, fell and could not get up.  Son found her on the floor the next morning.  During transportation by EMS, patient was found to be severely bradycardic, required external pacemaker on route.  In the emergency department, her bradycardia had resolved.  On admission, patient was quite obtunded &  CK was elevated as well as troponin.  She was admitted for lethargy, rhabdomyolysis and elevated troponin. Overnight, patient had acute decompensation with worsening mentation as well as hypotension.  At that time, son decided to transition patient to comfort measures.  Subjective: Alert today. No complaints today. Son would like to rescind comfort care decision.   Assessment & Plan:   Principal Problem:   Lethargic/ Syncope? Bradycardia? - ? Due to bradycardia- d/c Metoprolol she is more alert now  Active Problems:   Rhabdomyolysis - due to laying on the floor- improving    Elevated troponin- h/o   CAD (coronary artery disease) - likely a byproduct of Rhabdo rather than an acute MI, I have explained this to the patient's son but he would like a work up to ensure she did not have an MI - will obtain an ECHO  Hypokalemia/ hypomagnesemia  - replace    Hypotension with h/o Essential hypertension - hold Metoprolol, Norvasc and Imdur  Left knee pain - ? Due to fall- will order imaging  RLS - Myrbetriq   Hypothyroidism - resume Synthroid    Time spent in minutes:  35 DVT prophylaxis:  SCDs Code Status: DNR Family Communication: son at bedside Disposition Plan:  home Consultants:   none Procedures:   none Antimicrobials:  Anti-infectives (From admission, onward)   None       Objective: Vitals:   08/29/18 0300 08/29/18 1212 08/30/18 0112 08/30/18 1228  BP:  (!) 127/55 (!) 126/47 100/61  Pulse:  93 (!) 103 (!) 108  Resp:   18 18  Temp:  98.2 F (36.8 C) 98.4 F (36.9 C)   TempSrc:  Oral Oral   SpO2:  92% 90% 96%  Weight: 68.8 kg  69.5 kg   Height:        Intake/Output Summary (Last 24 hours) at 08/30/2018 1305 Last data filed at 08/30/2018 0107 Gross per 24 hour  Intake 480 ml  Output 600 ml  Net -120 ml   Filed Weights   08/28/18 1502 08/29/18 0300 08/30/18 0112  Weight: 65.8 kg 68.8 kg 69.5 kg    Examination: General exam: Appears comfortable  HEENT: PERRLA, oral mucosa moist, no sclera icterus or thrush Respiratory system: Clear to auscultation. Respiratory effort normal. Cardiovascular system: S1 & S2 heard, RRR.   Gastrointestinal system: Abdomen soft, non-tender, nondistended. Normal bowel sounds. Central nervous system: Alert and oriented. No focal neurological deficits. Extremities: No cyanosis, clubbing or edema Skin: No rashes or ulcers Psychiatry:  Mood & affect appropriate.     Data Reviewed: I have personally reviewed following labs and imaging studies  CBC: Recent Labs  Lab 08/28/18 1442  WBC 10.0  NEUTROABS 7.5  HGB 14.6  HCT 45.8  MCV 88.6  PLT 183  Basic Metabolic Panel: Recent Labs  Lab 08/28/18 1442 08/30/18 0853  NA 138 137  K 3.3* 3.1*  CL 100 97*  CO2 24 28  GLUCOSE 127* 162*  BUN 18 13  CREATININE 0.92 0.88  CALCIUM 10.0 8.8*   GFR: Estimated Creatinine Clearance: 36 mL/min (by C-G formula based on SCr of 0.88 mg/dL). Liver Function Tests: Recent Labs  Lab 08/28/18 1442  AST 55*  ALT 23  ALKPHOS 42  BILITOT 0.7  PROT 6.8  ALBUMIN 3.8   No results for input(s): LIPASE, AMYLASE in the last 168 hours. No results for input(s): AMMONIA in the last 168  hours. Coagulation Profile: Recent Labs  Lab 08/28/18 1921  INR 1.0   Cardiac Enzymes: Recent Labs  Lab 08/28/18 1504 08/30/18 0853  CKTOTAL 1,438* 364*  TROPONINI  --  0.03*   BNP (last 3 results) No results for input(s): PROBNP in the last 8760 hours. HbA1C: No results for input(s): HGBA1C in the last 72 hours. CBG: No results for input(s): GLUCAP in the last 168 hours. Lipid Profile: No results for input(s): CHOL, HDL, LDLCALC, TRIG, CHOLHDL, LDLDIRECT in the last 72 hours. Thyroid Function Tests: No results for input(s): TSH, T4TOTAL, FREET4, T3FREE, THYROIDAB in the last 72 hours. Anemia Panel: No results for input(s): VITAMINB12, FOLATE, FERRITIN, TIBC, IRON, RETICCTPCT in the last 72 hours. Urine analysis:    Component Value Date/Time   COLORURINE YELLOW 08/01/2018 1438   APPEARANCEUR CLOUDY (A) 08/01/2018 1438   LABSPEC 1.013 08/01/2018 1438   PHURINE 8.0 08/01/2018 1438   GLUCOSEU NEGATIVE 08/01/2018 1438   HGBUR NEGATIVE 08/01/2018 1438   BILIRUBINUR NEGATIVE 08/01/2018 1438   KETONESUR NEGATIVE 08/01/2018 1438   PROTEINUR NEGATIVE 08/01/2018 1438   NITRITE NEGATIVE 08/01/2018 1438   LEUKOCYTESUR TRACE (A) 08/01/2018 1438   Sepsis Labs: @LABRCNTIP (procalcitonin:4,lacticidven:4) )No results found for this or any previous visit (from the past 240 hour(s)).       Radiology Studies: Ct Head Wo Contrast  Result Date: 08/28/2018 CLINICAL DATA:  Found on floor at independent living facility by family, patient does remember falling, history coronary artery disease post MI, hypertension, seizures, EXAM: CT HEAD WITHOUT CONTRAST CT CERVICAL SPINE WITHOUT CONTRAST TECHNIQUE: Multidetector CT imaging of the head and cervical spine was performed following the standard protocol without intravenous contrast. Multiplanar CT image reconstructions of the cervical spine were also generated. COMPARISON:  12/12/2017 FINDINGS: CT HEAD FINDINGS Brain: Generalized atrophy.  Normal ventricular morphology. No midline shift or mass effect. Small vessel chronic ischemic changes of deep cerebral white matter. No intracranial hemorrhage, mass lesion, evidence of acute infarction, or extra-axial fluid collection. Vascular: Atherosclerotic calcifications of internal carotid and vertebral arteries at skull base Skull: Intact Sinuses/Orbits: Mucosal retention cyst RIGHT maxillary sinus. Otherwise clear. Other: N/A CT CERVICAL SPINE FINDINGS Alignment: Normal Skull base and vertebrae: Osseous demineralization. Visualized skull base intact. Prior anterior fusion C5-C7. Multilevel degenerative disc disease changes with disc space narrowing and endplate spur formation. Multilevel facet degenerative changes. Vertebral body heights maintained without fracture or subluxation. Minimal scattered motion artifacts. Endplate spurring at C6-C7 causes AP narrowing of the spinal canal and probable cord compression greater LEFT of midline. Hardware appears intact. Soft tissues and spinal canal: Prevertebral soft tissues normal thickness. Atherosclerotic calcifications at carotid bifurcations. Remaining cervical soft tissues unremarkable. Disc levels:  As above Upper chest: Lung apices clear Other: N/A IMPRESSION: Mild atrophy with small vessel chronic ischemic changes of deep cerebral white matter. No acute intracranial abnormalities. Degenerative disc and  facet disease changes of the cervical spine. Prior anterior cervical fusion C5-C7. No acute cervical spine abnormalities. Electronically Signed   By: Ulyses Southward M.D.   On: 08/28/2018 15:57   Ct Cervical Spine Wo Contrast  Result Date: 08/28/2018 CLINICAL DATA:  Found on floor at independent living facility by family, patient does remember falling, history coronary artery disease post MI, hypertension, seizures, EXAM: CT HEAD WITHOUT CONTRAST CT CERVICAL SPINE WITHOUT CONTRAST TECHNIQUE: Multidetector CT imaging of the head and cervical spine was performed  following the standard protocol without intravenous contrast. Multiplanar CT image reconstructions of the cervical spine were also generated. COMPARISON:  12/12/2017 FINDINGS: CT HEAD FINDINGS Brain: Generalized atrophy. Normal ventricular morphology. No midline shift or mass effect. Small vessel chronic ischemic changes of deep cerebral white matter. No intracranial hemorrhage, mass lesion, evidence of acute infarction, or extra-axial fluid collection. Vascular: Atherosclerotic calcifications of internal carotid and vertebral arteries at skull base Skull: Intact Sinuses/Orbits: Mucosal retention cyst RIGHT maxillary sinus. Otherwise clear. Other: N/A CT CERVICAL SPINE FINDINGS Alignment: Normal Skull base and vertebrae: Osseous demineralization. Visualized skull base intact. Prior anterior fusion C5-C7. Multilevel degenerative disc disease changes with disc space narrowing and endplate spur formation. Multilevel facet degenerative changes. Vertebral body heights maintained without fracture or subluxation. Minimal scattered motion artifacts. Endplate spurring at C6-C7 causes AP narrowing of the spinal canal and probable cord compression greater LEFT of midline. Hardware appears intact. Soft tissues and spinal canal: Prevertebral soft tissues normal thickness. Atherosclerotic calcifications at carotid bifurcations. Remaining cervical soft tissues unremarkable. Disc levels:  As above Upper chest: Lung apices clear Other: N/A IMPRESSION: Mild atrophy with small vessel chronic ischemic changes of deep cerebral white matter. No acute intracranial abnormalities. Degenerative disc and facet disease changes of the cervical spine. Prior anterior cervical fusion C5-C7. No acute cervical spine abnormalities. Electronically Signed   By: Ulyses Southward M.D.   On: 08/28/2018 15:57   Dg Chest Port 1 View  Result Date: 08/28/2018 CLINICAL DATA:  Shortness of breath. EXAM: PORTABLE CHEST 1 VIEW COMPARISON:  April 27, 2017  FINDINGS: No pneumothorax. A transcutaneous pacer overlies left-sided chest. The heart, hila, mediastinum, lungs, and pleura are unremarkable. Minimal atelectasis in the left base. IMPRESSION: No active disease. Electronically Signed   By: Gerome Sam III M.D   On: 08/28/2018 15:07      Scheduled Meds: . aspirin EC  81 mg Oral QHS  . baclofen  30 mg Oral BID   And  . baclofen  20 mg Oral Q1400  . clopidogrel  75 mg Oral Daily  . famotidine  10 mg Oral Daily  . gabapentin  400 mg Oral QID  . mirabegron ER  25 mg Oral Daily  . potassium chloride  40 mEq Oral Q4H   Continuous Infusions:   LOS: 2 days      Calvert Cantor, MD [facility] Pager: [email or web address] Password [email or web address] 08/30/2018, 1:05 PM

## 2018-08-30 NOTE — Progress Notes (Signed)
  Echocardiogram 2D Echocardiogram has been performed.  Kimberly Bass 08/30/2018, 2:33 PM

## 2018-08-30 NOTE — NC FL2 (Signed)
[place] MEDICAID FL2 LEVEL OF CARE SCREENING TOOL     IDENTIFICATION  Patient Name: Kimberly Bass Birthdate: Oct 22, 1925 Sex: female Admission Date (Current Location): 08/28/2018  [facility] and [place] Number:  Producer, television/film/video and Address:  [facility], [address] [place], [place] [address]      Provider Number: [id number]  Attending Physician Name and Address:  Calvert Cantor, MD  Relative Name and Phone Number:       Current Level of Care: Hospital Recommended Level of Care: Skilled Nursing Facility Prior Approval Number:    Date Approved/Denied:   PASRR Number: [id number]  Discharge Plan: SNF    Current Diagnoses: Patient Active Problem List   Diagnosis Date Noted  . Rhabdomyolysis 08/29/2018  . Acute metabolic encephalopathy   . Hypomagnesemia   . Pyuria 08/01/2018  . AMS (altered mental status) 08/01/2018  . Altered mental status 08/01/2018  . Hypothyroidism 12/12/2017  . Acute encephalopathy 12/12/2017  . Unwitnessed fall 12/12/2017  . Essential hypertension 12/12/2017  . Hypokalemia 12/12/2017  . Hyperlipidemia 05/05/2017  . Urinary tract infection with hematuria 04/27/2017  . Tremor, essential 11/03/2015  . 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 Abnormality of gait 08/15/2012  . Essential and other specified forms of tremor 08/15/2012  . Degeneration of lumbar or lumbosacral intervertebral disc 08/15/2012  . Hx of low back pain 08/15/2012  . CAD (coronary artery disease) 12/23/2010  . Hx of transient ischemic attack (TIA)   . CONSTIPATION 02/27/2009  . PERSONAL HX COLONIC POLYPS 02/27/2009    Orientation RESPIRATION BLADDER Height & Weight     Self, Time, Situation, Place  Normal Continent Weight: 153 lb 3.5 oz (69.5 kg) Height:  4\' 11"  (149.9 cm)(Per son/HCPOA)  BEHAVIORAL SYMPTOMS/MOOD NEUROLOGICAL BOWEL NUTRITION STATUS  (None) (None) Continent Diet(Soft)  AMBULATORY STATUS COMMUNICATION OF NEEDS Skin   Limited Assist  Verbally Bruising, Other (Comment)(Skin tear.)                       Personal Care Assistance Level of Assistance  Bathing, Feeding, Dressing Bathing Assistance: Limited assistance Feeding assistance: Limited assistance Dressing Assistance: Limited assistance     Functional Limitations Info  Sight, Hearing, Speech Sight Info: Adequate Hearing Info: Adequate Speech Info: Adequate    SPECIAL CARE FACTORS FREQUENCY  PT (By licensed PT), Blood pressure, OT (By licensed OT)     PT Frequency: 5 x week OT Frequency: 5 x week            Contractures Contractures Info: Not present    Additional Factors Info  Code Status, Allergies Code Status Info: DNR Allergies Info: Codeine, Meperidine Hcl, Morphine, Penicillins           Current Medications (08/30/2018):  This is the current hospital active medication list Current Facility-Administered Medications  Medication Dose Route Frequency Provider Last Rate Last Dose  . aspirin EC tablet 81 mg  81 mg Oral QHS Rizwan, Ladell Heads, MD      . baclofen (LIORESAL) tablet 30 mg  30 mg Oral BID Noralee Stain, DO   30 mg at 08/30/18 0855   And  . baclofen (LIORESAL) tablet 20 mg  20 mg Oral Q1400 Noralee Stain, DO   20 mg at 08/29/18 1739  . clopidogrel (PLAVIX) tablet 75 mg  75 mg Oral Daily Calvert Cantor, MD   75 mg at 08/30/18 0854  . famotidine (PEPCID) tablet 10 mg  10 mg Oral Daily Calvert Cantor, MD  10 mg at 08/30/18 0854  . gabapentin (NEURONTIN) capsule 400 mg  400 mg Oral QID Noralee Stain, DO   400 mg at 08/30/18 0855  . LORazepam (ATIVAN) injection 0.5 mg  0.5 mg Intravenous Q4H PRN Kirby-Graham, Beather Arbour, NP      . mirabegron ER (MYRBETRIQ) tablet 25 mg  25 mg Oral Daily Calvert Cantor, MD   25 mg at 08/30/18 1000  . morphine 2 MG/ML injection 1 mg  1 mg Intravenous Q2H PRN Leda Gauze, NP   1 mg at 08/28/18 2255  . potassium chloride SA (K-DUR,KLOR-CON) CR tablet 40 mEq  40 mEq Oral Q4H Calvert Cantor, MD          Discharge Medications: Please see discharge summary for a list of discharge medications.  Relevant Imaging Results:  Relevant Lab Results:   Additional Information SS#: [id number]. From [facility].  Margarito Liner, LCSW

## 2018-08-30 NOTE — Clinical Social Work Note (Signed)
Left bed offers in room with patient. Called son who stated [facility] first preference, second preference is [facility]. Asked [facility] admissions coordinator to review referral. [facility] already offered a bed.  Charlynn Court, CSW [phone]

## 2018-08-30 NOTE — Clinical Social Work Placement (Signed)
   CLINICAL SOCIAL WORK PLACEMENT  NOTE  Date:  08/30/2018  Patient Details  Name: Kimberly Bass MRN: [id number] Date of Birth: 1925/09/29  Clinical Social Work is seeking post-discharge placement for this patient at the Skilled  Nursing Facility level of care (*CSW will initial, date and re-position this form in  chart as items are completed):      Patient/family provided with [facility] Clinical Social Work Department's list of facilities offering this level of care within the geographic area requested by the patient (or if unable, by the patient's family).      Patient/family informed of their freedom to choose among providers that offer the needed level of care, that participate in Medicare, Medicaid or managed care program needed by the patient, have an available bed and are willing to accept the patient.      Patient/family informed of [facility] ownership interest in [facility] and [facility], as well as of the fact that they are under no obligation to receive care at these facilities.  PASRR submitted to EDS on 08/30/18     PASRR number received on       Existing PASRR number confirmed on 08/30/18     FL2 transmitted to all facilities in geographic area requested by pt/family on 08/30/18     FL2 transmitted to all facilities within larger geographic area on       Patient informed that his/her managed care company has contracts with or will negotiate with certain facilities, including the following:            Patient/family informed of bed offers received.  Patient chooses bed at       Physician recommends and patient chooses bed at      Patient to be transferred to   on  .  Patient to be transferred to facility by       Patient family notified on   of transfer.  Name of family member notified:        PHYSICIAN Please sign FL2     Additional Comment:    _______________________________________________ Margarito Liner, LCSW 08/30/2018, 12:29  PM

## 2018-08-30 NOTE — Progress Notes (Signed)
Physical Therapy Treatment Patient Details Name: Kimberly Bass MRN: [id number] DOB: 10-Aug-1925 Today's Date: 08/30/2018    History of Present Illness Pt is a 85 y.o. female admitted 08/28/18 after falling at home and being found down next morning by son; bradycardic with EMS transport requiring external pacemaker. Head and cervical CT negative for acute abnormality. Worked up for mild rhabdomyolysis and acute metabolic encephalopathy. PMH includes HTN, CAD, TIA.    PT Comments    Pt limited by increased L knee pain compared to yesterday's PT evaluation. Today, pt required modA+2 to stand; unable to tolerate bearing enough weight through LLE to allow for gait training, requiring modA+2 to pivot to recliner. Pt remains agreeable to participate despite pain. Son present; family and pt now agreeable to SNF-level therapies. MD notified of pt's L knee complaints.   Follow Up Recommendations  SNF;Supervision/Assistance - 24 hour     Equipment Recommendations  3in1 (PT);Rolling walker with 5" wheels    Recommendations for Other Services       Precautions / Restrictions Precautions Precautions: Fall Restrictions Weight Bearing Restrictions: No    Mobility  Bed Mobility Overal bed mobility: Needs Assistance Bed Mobility: Supine to Sit     Supine to sit: Mod assist;HOB elevated     General bed mobility comments: Cues to initiate movement, increased time and effort grimacing/moaning due to LLE pain throughout; ModA for UE support to assist trunk elevation and scoot hips to EOB  Transfers Overall transfer level: Needs assistance Equipment used: Rolling walker (2 wheeled);2 person hand held assist Transfers: Sit to/from [employer] Sit to Stand: Mod assist;+2 physical assistance   Squat pivot transfers: Mod assist;+2 physical assistance     General transfer comment: ModA+2 to assist trunk elevation standing from EOB to RW; pt unable to bear full weight through LLE  despite assist, unable to take step. Transfer to recliner with modA+2 for squat pivot  Ambulation/Gait             General Gait Details: Unable to secondary to L knee pain   Stairs             Wheelchair Mobility    Modified Rankin (Stroke Patients Only)       Balance Overall balance assessment: Needs assistance   Sitting balance-Leahy Scale: Fair       Standing balance-Leahy Scale: Poor Standing balance comment: Reliant on UE support and external assist                            Cognition Arousal/Alertness: Awake/alert Behavior During Therapy: WFL for tasks assessed/performed Overall Cognitive Status: Impaired/Different from baseline Area of Impairment: Attention;Following commands;Safety/judgement;Awareness;Problem solving;Memory                   Current Attention Level: Selective Memory: Decreased short-term memory Following Commands: Follows one step commands with increased time;Follows multi-step commands inconsistently Safety/Judgement: Decreased awareness of deficits;Decreased awareness of safety Awareness: Emergent Problem Solving: Slow processing;Requires verbal cues General Comments: A&Ox4. Generally slow to respond and follow commands, but doing so appropriately. Decreased attention; distracted by pain. Does not remember PT evaluation yesterday      Exercises Other Exercises Other Exercises: AAROM LAQ; L knee flexion/heel slides limited to ~100' due to knee pain    General Comments General comments (skin integrity, edema, etc.): Son present this session. SpO2 down to 88-94% on RA, 1L O2 [place] replaced; HR up to 127-148 sitting EOB  Pertinent Vitals/Pain Pain Assessment: Faces Faces Pain Scale: Hurts whole lot Pain Location: L knee with all movement and weightbearing Pain Descriptors / Indicators: Grimacing;Guarding;Sore;Moaning Pain Intervention(s): Limited activity within patient's tolerance;Monitored during  session;Repositioned;Other (comment)(RN notified to request imaging)    Home Living                      Prior Function            PT Goals (current goals can now be found in the care plan section) Acute Rehab PT Goals Patient Stated Goal: Be able to return home PT Goal Formulation: With patient/family Time For Goal Achievement: 09/12/18 Potential to Achieve Goals: Fair Progress towards PT goals: Not progressing toward goals - comment(Limited by L knee pain)    Frequency    Min 2X/week      PT Plan Frequency needs to be updated    Co-evaluation PT/OT/SLP Co-Evaluation/Treatment: Yes Reason for Co-Treatment: For patient/therapist safety;To address functional/ADL transfers;Other (comment)(pt limited by pain) PT goals addressed during session: Mobility/safety with mobility;Balance        AM-PAC PT "6 Clicks" Mobility   Outcome Measure  Help needed turning from your back to your side while in a flat bed without using bedrails?: A Little Help needed moving from lying on your back to sitting on the side of a flat bed without using bedrails?: A Lot Help needed moving to and from a bed to a chair (including a wheelchair)?: A Lot Help needed standing up from a chair using your arms (e.g., wheelchair or bedside chair)?: A Lot Help needed to walk in hospital room?: A Lot Help needed climbing 3-5 steps with a railing? : Total 6 Click Score: 12    End of Session Equipment Utilized During Treatment: Gait belt Activity Tolerance: Patient limited by pain Patient left: in chair;with call bell/phone within reach;with chair alarm set;with family/visitor present Nurse Communication: Mobility status PT Visit Diagnosis: Other abnormalities of gait and mobility (R26.89);Muscle weakness (generalized) (M62.81)     Time: 6811-5726 PT Time Calculation (min) (ACUTE ONLY): 24 min  Charges:  $Therapeutic Activity: 8-22 mins                    Ina Homes, PT, DPT Acute  Rehabilitation Services  Pager [phone] Office [phone]  Malachy Chamber 08/30/2018, 1:06 PM

## 2018-08-31 LAB — MAGNESIUM: MAGNESIUM: 2.2 mg/dL (ref 1.7–2.4)

## 2018-08-31 LAB — BASIC METABOLIC PANEL
Anion gap: 10 (ref 5–15)
BUN: 11 mg/dL (ref 8–23)
CO2: 30 mmol/L (ref 22–32)
Calcium: 8.7 mg/dL — ABNORMAL LOW (ref 8.9–10.3)
Chloride: 98 mmol/L (ref 98–111)
Creatinine, Ser: 0.83 mg/dL (ref 0.44–1.00)
GFR calc Af Amer: >60 mL/min (ref >60)
GFR calc non Af Amer: >60 mL/min (ref >60)
Glucose, Bld: 131 mg/dL — ABNORMAL HIGH (ref 70–99)
POTASSIUM: 3.4 mmol/L — AB (ref 3.5–5.1)
Sodium: 138 mmol/L (ref 135–145)

## 2018-08-31 MED ORDER — POTASSIUM CHLORIDE CRYS ER 20 MEQ PO TBCR
40.0000 meq | EXTENDED_RELEASE_TABLET | Freq: Once | ORAL | Status: AC
  Administered 2018-08-31: 40 meq via ORAL
  Filled 2018-08-31: qty 2

## 2018-08-31 NOTE — Clinical Social Work Note (Signed)
[facility] is full and unsure when a bed will be available. Per MD, patient may discharge tomorrow. CSW notified patient's son and he is agreeable to [facility]. Patient is agreeable to whatever son chooses. Admissions coordinator will start insurance authorization.  Charlynn Court, CSW [phone]

## 2018-08-31 NOTE — Procedures (Signed)
Procedure: Left knee aspiration and injection  Indication: Left knee effusion(s)  Surgeon: Charma Igo, PA-C  Assist: None  Anesthesia: None  EBL: None  Complications: None  Findings: After risks/benefits explained patient desires to undergo procedure. Consent obtained and time out performed. The left knee was sterilely prepped and aspirated. 15+ml blood aspirated. Pt tolerated the procedure well.    Freeman Caldron, PA-C Orthopedic Surgery [phone]

## 2018-08-31 NOTE — Progress Notes (Signed)
Pt still have order for comfort measures, notified Donnamarie Poag NP and was told to let Primary MD to discontinue order in AM, no family at bedside, Will report to oncoming shift this AM.

## 2018-08-31 NOTE — Progress Notes (Signed)
PROGRESS NOTE    ANDI ORDERS   OFB:[id number]  DOB: September 23, 1925  DOA: 08/28/2018 PCP: Merri Brunette, MD   Brief Narrative:  Kimberly Bass is a 85 year old female at home with past medical history significant for CAD, hypertension, hyperlipidemia, hypothyroidism, previous TIA who presented to the hospital after being found on the floor at home.  Apparently, patient went up to go use the bathroom around 10:51 PM, fell and could not get up.  Son found her on the floor the next morning.  During transportation by EMS, patient was found to be severely bradycardic, required external pacemaker on route.  In the emergency department, her bradycardia had resolved.  On admission, patient was quite obtunded &  CK was elevated as well as troponin.  She was admitted for lethargy, rhabdomyolysis and elevated troponin. Overnight, patient had acute decompensation with worsening mentation as well as hypotension.  At that time, son decided to transition patient to comfort measures.  Subjective:  Pain in left knee when standing on it.   Assessment & Plan:   Principal Problem:   Lethargic/ Syncope? Bradycardia - ? Due to bradycardia-  HR in 20s-60s when picked up by EMS- in 50s in ED with BP in 70s/30s - noted to be in A-fib yesterday with normal rate- ? Could the syncope be a CVA- at this point, it will not change management and thus not MRI ordered  Active Problems: Paroxysmal A-fib - on Plavix- CHA2DS2-VASc Score 5- she follows with Dr Elease Hashimoto and thus cardiology consulted- Metoprolol on hold due to bradycardia and hypotension on admission     Rhabdomyolysis - due to laying on the floor- improving    Elevated troponin- h/o   CAD (coronary artery disease) - likely a byproduct of Rhabdo rather than an acute MI, I have explained this to the patient's son but he would like a work up to ensure she did not have an MI -  ECHO done yesterday shows no wall motion abnormalities and normal EF-  diastolic function could not be determined due to A-fib  Hypokalemia/ hypomagnesemia  - replacing    Hypotension with h/o Essential hypertension - holding Metoprolol, Norvasc and Imdur  Left knee pain - ? Due to fall- xray shows and effusion and thus I consulted ortho- about 15 cc of blood removed today by ortho PA  RLS - Myrbetriq   Hypothyroidism - resume Synthroid    Time spent in minutes:  35 DVT prophylaxis:  SCDs Code Status: DNR Family Communication: son at bedside Disposition Plan: SNF once cardiology eval completed Consultants:   cardiology Procedures:   2 D ECHO 1. The left ventricle has hyperdynamic systolic function, with an ejection fraction of >65%. There is moderately increased left ventricular wall thickness. Left ventricular diastology could not be evaluated secondary to atrial fibrillation. No evidence  of left ventricular regional wall motion abnormalities.  2. The right ventricle has normal systolic function. The cavity was normal. There is no increase in right ventricular wall thickness.  3. The mitral valve is normal in structure.  4. The aortic valve was not well visualized.  5. The ascending aorta and aortic root are normal in size and structure.  6. No evidence of left ventricular regional wall motion abnormalities.  Antimicrobials:  Anti-infectives (From admission, onward)   None       Objective: Vitals:   08/30/18 2043 08/31/18 0432 08/31/18 0443 08/31/18 1232  BP: (!) 126/50 139/72 112/76 (!) 146/59  Pulse: (!) 104 91 60 (!)  101  Resp: Temp: 100.2 F (37.9 C) 98.2 F (36.8 C) 97.9 F (36.6 C) 98.4 F (36.9 C)  TempSrc: Oral Oral Oral Oral  SpO2: 95% 98% 91% 95%  Weight:      Height:        Intake/Output Summary (Last 24 hours) at 08/31/2018 1247 Last data filed at 08/30/2018 2200 Gross per 24 hour  Intake 480 ml  Output 1100 ml  Net -620 ml   Filed Weights   08/28/18 1502 08/29/18 0300 08/30/18 0112  Weight:  65.8 kg 68.8 kg 69.5 kg    Examination: General exam: Appears comfortable  HEENT: PERRLA, oral mucosa moist, no sclera icterus or thrush Respiratory system: Clear to auscultation. Respiratory effort normal. Cardiovascular system: S1 & S2 heard, RRR.   Gastrointestinal system: Abdomen soft, non-tender, nondistended. Normal bowel sounds. Central nervous system: Alert and oriented. No focal neurological deficits. Extremities: No cyanosis, clubbing or edema MSK: left knee abrasion and swelling in medial part Skin: No rashes or ulcers Psychiatry:  Mood & affect appropriate.     Data Reviewed: I have personally reviewed following labs and imaging studies  CBC: Recent Labs  Lab 08/28/18 1442  WBC 10.0  NEUTROABS 7.5  HGB 14.6  HCT 45.8  MCV 88.6  PLT 183   Basic Metabolic Panel: Recent Labs  Lab 08/28/18 1442 08/30/18 0853 08/31/18 0627  NA 138 137 138  K 3.3* 3.1* 3.4*  CL 100 97* 98  CO2 GLUCOSE 127* 162* 131*  BUN CREATININE 0.92 0.88 0.83  CALCIUM 10.0 8.8* 8.7*  MG  --  1.6* 2.2   GFR: Estimated Creatinine Clearance: 38.2 mL/min (by C-G formula based on SCr of 0.83 mg/dL). Liver Function Tests: Recent Labs  Lab 08/28/18 1442  AST 55*  ALT 23  ALKPHOS 42  BILITOT 0.7  PROT 6.8  ALBUMIN 3.8   No results for input(s): LIPASE, AMYLASE in the last 168 hours. No results for input(s): AMMONIA in the last 168 hours. Coagulation Profile: Recent Labs  Lab 08/28/18 1921  INR 1.0   Cardiac Enzymes: Recent Labs  Lab 08/28/18 1504 08/30/18 0853  CKTOTAL 1,438* 364*  TROPONINI  --  0.03*   BNP (last 3 results) No results for input(s): PROBNP in the last 8760 hours. HbA1C: No results for input(s): HGBA1C in the last 72 hours. CBG: No results for input(s): GLUCAP in the last 168 hours. Lipid Profile: No results for input(s): CHOL, HDL, LDLCALC, TRIG, CHOLHDL, LDLDIRECT in the last 72 hours. Thyroid Function Tests: No results for  input(s): TSH, T4TOTAL, FREET4, T3FREE, THYROIDAB in the last 72 hours. Anemia Panel: No results for input(s): VITAMINB12, FOLATE, FERRITIN, TIBC, IRON, RETICCTPCT in the last 72 hours. Urine analysis:    Component Value Date/Time   COLORURINE YELLOW 08/01/2018 1438   APPEARANCEUR CLOUDY (A) 08/01/2018 1438   LABSPEC 1.013 08/01/2018 1438   PHURINE 8.0 08/01/2018 1438   GLUCOSEU NEGATIVE 08/01/2018 1438   HGBUR NEGATIVE 08/01/2018 1438   BILIRUBINUR NEGATIVE 08/01/2018 1438   KETONESUR NEGATIVE 08/01/2018 1438   PROTEINUR NEGATIVE 08/01/2018 1438   NITRITE NEGATIVE 08/01/2018 1438   LEUKOCYTESUR TRACE (A) 08/01/2018 1438   Sepsis Labs: (procalcitonin:4,lacticidven:4) )No results found for this or any previous visit (from the past 240 hour(s)).       Radiology Studies: Dg Knee Complete 4 Views Left  Result Date: 08/30/2018 CLINICAL DATA:  The left knee anterior abrasions, swelling and  pain following a fall 5 days ago EXAM: LEFT KNEE - COMPLETE 4+ VIEW COMPARISON:  None. FINDINGS: Mild lateral joint space narrowing. Medial and lateral meniscal calcifications. Minimal spur formation involving all 3 joint compartments. Small to moderate-sized effusion. No fracture or dislocation seen. IMPRESSION: 1. Small to moderate-sized effusion. 2. No fracture. 3. Minimal tricompartmental degenerative changes. 4. Chondrocalcinosis. Electronically Signed   By: Beckie Salts M.D.   On: 08/30/2018 19:48      Scheduled Meds: . aspirin EC  81 mg Oral QHS  . baclofen  30 mg Oral BID   And  . baclofen  20 mg Oral Q1400  . clopidogrel  75 mg Oral Daily  . famotidine  10 mg Oral Daily  . gabapentin  400 mg Oral QID  . mirabegron ER  25 mg Oral Daily   Continuous Infusions:   LOS: 3 days      Calvert Cantor, MD [facility] Pager: [email or web address] Password [facility] 08/31/2018, 12:47 PM

## 2018-08-31 NOTE — Care Management Important Message (Signed)
Important Message  Patient Details  Name: Kimberly Bass MRN: [id number] Date of Birth: 1925/11/03   Medicare Important Message Given:  Yes    [name] 08/31/2018, 1:22 PM

## 2018-08-31 NOTE — Consult Note (Signed)
Reason for Consult:Left knee effusion Referring Physician: S Rizwan  Kimberly Bass is an 85 y.o. female.  HPI: Kimberly Bass fell on Saturday night onto her left knee. She had pain but was able to bear weight the following day. By Monday, though, it had swollen and she could no longer walk on it. It's been hampering her therapy while she's been admitted and orthopedic surgery was asked to do an arthrocentesis.  Past Medical History:  Diagnosis Date  . Arthritis    "mild; in all my joints" (04/27/2017)  . Benign essential tremor   . Coronary artery disease   . Cystitis with hematuria 04/26/2017  . Gait disorder   . H/O radioactive iodine thyroid ablation   . Hx of transient ischemic attack (TIA)   . Hyperlipidemia   . Hypertension    "not since MI in 2007" (04/27/2017)  . Hyperthyroidism    H/O radioactive iodine thyroid ablation [Z92.3]  . Hypothyroidism   . Lower extremity myoclonus   . Lumbosacral spondylosis   . Migraine    "I've had one in my lifetime" (04/27/2017)  . Mild obesity   . Myocardial infarction (HCC) 12/2005  . Myoclonus    Lower extremity involvement  . Obesity   . Pneumonia    "lots when I was a small child; not at all since" (04/27/2017)  . Seizures (HCC)    History is questionable  . Thyroid disease     Past Surgical History:  Procedure Laterality Date  . ABDOMINAL HYSTERECTOMY  1972   for uterine fibroids/notes 11/10/2010  . ANTERIOR CERVICAL DECOMP/DISCECTOMY FUSION  03/2004   [name] 11/10/2010  . APPENDECTOMY    . BACK SURGERY    . BREAST BIOPSY Right 1996   nodule resected/notes 11/10/2010  . CARDIAC CATHETERIZATION  01/03/2006  . CATARACT EXTRACTION W/ INTRAOCULAR LENS  IMPLANT, BILATERAL Bilateral   . COLONOSCOPY  11/2003   [name] 11/10/2010  . ESOPHAGOGASTRODUODENOSCOPY  09/2006   [name] 11/10/2010  . LUMBAR DISC SURGERY  1990s X 1; 05/2003   "a nerve was pinching"; [name] 11/10/2010  . OVARY SURGERY Left 1999   benign tumor resected  /notes 11/10/2010  . TONSILLECTOMY  ~ 1947    Family History  Problem Relation Age of Onset  . Heart disease Father   . Heart attack Mother   . Stroke Sister   . Heart attack Sister   . Heart attack Brother        Possible  . Diabetes Sister     Social History:  reports that she has never smoked. She has never used smokeless tobacco. She reports that she does not drink alcohol or use drugs.  Allergies:  Allergies  Allergen Reactions  . Codeine Nausea Only  . Meperidine Hcl Nausea Only  . Morphine Nausea Only  . Penicillins Nausea Only    Has patient had a PCN reaction causing immediate rash, facial/tongue/throat swelling, SOB or lightheadedness with hypotension: Unk Has patient had a PCN reaction causing severe rash involving mucus membranes or skin necrosis: Unk Has patient had a PCN reaction that required hospitalization: Unk Has patient had a PCN reaction occurring within the last 10 years: >50 years ago If all of the above answers are "NO", then may proceed with Cephalosporin use.     Medications: I have reviewed the patient's current medications.  Results for orders placed or performed during the hospital encounter of 08/28/18 (from the past 48 hour(s))  Basic metabolic panel     Status: Abnormal  Collection Time: 08/30/18  8:53 AM  Result Value Ref Range   Sodium 137 135 - 145 mmol/L   Potassium 3.1 (L) 3.5 - 5.1 mmol/L   Chloride 97 (L) 98 - 111 mmol/L   CO2 28 22 - 32 mmol/L   Glucose, Bld 162 (H) 70 - 99 mg/dL   BUN 13 8 - 23 mg/dL   Creatinine, Ser 1.61 0.44 - 1.00 mg/dL   Calcium 8.8 (L) 8.9 - 10.3 mg/dL   GFR calc non Af Amer 58 (L) >60 mL/min   GFR calc Af Amer >60 >60 mL/min   Anion gap 12 5 - 15    Comment: Performed at [facility], [address] [place], [place] [address]  CK     Status: Abnormal   Collection Time: 08/30/18  8:53 AM  Result Value Ref Range   Total CK 364 (H) 38 - 234 U/L    Comment: Performed at [facility],  [address], [place], [place] [address]  Troponin I - ONCE - STAT     Status: Abnormal   Collection Time: 08/30/18  8:53 AM  Result Value Ref Range   Troponin I 0.03 (HH) <0.03 ng/mL    Comment: CRITICAL RESULT CALLED TO, READ BACK BY AND VERIFIED WITH: Florentina Jenny, RN AT 1017 ON 08/30/2018 BY SAINVILUS S Performed at [facility], [address], [place], [place] [address]   Magnesium     Status: Abnormal   Collection Time: 08/30/18  8:53 AM  Result Value Ref Range   Magnesium 1.6 (L) 1.7 - 2.4 mg/dL    Comment: Performed at [facility], [address], [place], [place] [address]  Basic metabolic panel     Status: Abnormal   Collection Time: 08/31/18  6:27 AM  Result Value Ref Range   Sodium 138 135 - 145 mmol/L   Potassium 3.4 (L) 3.5 - 5.1 mmol/L   Chloride 98 98 - 111 mmol/L   CO2 30 22 - 32 mmol/L   Glucose, Bld 131 (H) 70 - 99 mg/dL   BUN 11 8 - 23 mg/dL   Creatinine, Ser 6.21 0.44 - 1.00 mg/dL   Calcium 8.7 (L) 8.9 - 10.3 mg/dL   GFR calc non Af Amer >60 >60 mL/min   GFR calc Af Amer >60 >60 mL/min   Anion gap 10 5 - 15    Comment: Performed at [facility], [address], [place], [place] [address]  Magnesium     Status: None   Collection Time: 08/31/18  6:27 AM  Result Value Ref Range   Magnesium 2.2 1.7 - 2.4 mg/dL    Comment: Performed at [facility], [address] [place], [place] [address]    Dg Knee Complete 4 Views Left  Result Date: 08/30/2018 CLINICAL DATA:  The left knee anterior abrasions, swelling and pain following a fall 5 days ago EXAM: LEFT KNEE - COMPLETE 4+ VIEW COMPARISON:  None. FINDINGS: Mild lateral joint space narrowing. Medial and lateral meniscal calcifications. Minimal spur formation involving all 3 joint compartments. Small to moderate-sized effusion. No fracture or dislocation seen. IMPRESSION: 1. Small to moderate-sized effusion. 2. No fracture. 3. Minimal tricompartmental degenerative changes. 4. Chondrocalcinosis.  Electronically Signed   By: Beckie Salts M.D.   On: 08/30/2018 19:48    Review of Systems  Constitutional: Negative for weight loss.  HENT: Negative for ear discharge, ear pain, hearing loss and tinnitus.  Eyes: Negative for blurred vision, double vision, photophobia and pain.  Respiratory: Negative for cough, sputum production and shortness of breath.   Cardiovascular: Negative for chest pain.  Gastrointestinal: Negative for abdominal pain, nausea and vomiting.  Genitourinary: Negative for dysuria, flank pain, frequency and urgency.  Musculoskeletal: Positive for joint pain (Left knee). Negative for back pain, falls, myalgias and neck pain.  Neurological: Negative for dizziness, tingling, sensory change, focal weakness, loss of consciousness and headaches.  Endo/Heme/Allergies: Does not bruise/bleed easily.  Psychiatric/Behavioral: Negative for depression, memory loss and substance abuse. The patient is not nervous/anxious.    Blood pressure 112/76, pulse 60, temperature 97.9 F (36.6 C), temperature source Oral, resp. rate 18, height 4\' 11"  (1.499 m), weight 69.5 kg, SpO2 91 %. Physical Exam  Constitutional: She appears well-developed and well-nourished. No distress.  HENT:  Head: Normocephalic and atraumatic.  Eyes: Conjunctivae are normal. Right eye exhibits no discharge. Left eye exhibits no discharge. No scleral icterus.  Neck: Normal range of motion.  Cardiovascular: Normal rate and regular rhythm.  Respiratory: Effort normal. No respiratory distress.  Musculoskeletal:     Comments: LLE Superficial abrasion anterior knee, no ecchymosis or rash  Mod TTP  Mod knee effusion  Sens DPN, SPN, TN intact  Motor EHL, ext, flex, evers 5/5  DP 1+, PT 1+, No significant edema  Neurological: She is alert.  Skin: Skin is warm and dry. She is not diaphoretic.  Psychiatric: She has a normal mood and affect. Her behavior is normal.    Assessment/Plan: Left knee effusion -- Will perform  arthrocentesis. Risks of infection, hemarthrosis, and lack of efficacy were explained to her and son and they wish to proceed. Multiple medical problems including CAD, hypertension, hyperlipidemia, hypothyroidism and TIA -- per primary service    Freeman Caldron, PA-C Orthopedic Surgery [phone] 08/31/2018, 10:46 AM

## 2018-09-01 DIAGNOSIS — R300 Dysuria: Secondary | ICD-10-CM | POA: Diagnosis not present

## 2018-09-01 DIAGNOSIS — I119 Hypertensive heart disease without heart failure: Secondary | ICD-10-CM | POA: Diagnosis not present

## 2018-09-01 DIAGNOSIS — Z7401 Bed confinement status: Secondary | ICD-10-CM | POA: Diagnosis not present

## 2018-09-01 DIAGNOSIS — I2510 Atherosclerotic heart disease of native coronary artery without angina pectoris: Secondary | ICD-10-CM

## 2018-09-01 DIAGNOSIS — R55 Syncope and collapse: Secondary | ICD-10-CM

## 2018-09-01 DIAGNOSIS — M150 Primary generalized (osteo)arthritis: Secondary | ICD-10-CM | POA: Diagnosis not present

## 2018-09-01 DIAGNOSIS — I952 Hypotension due to drugs: Secondary | ICD-10-CM

## 2018-09-01 DIAGNOSIS — R319 Hematuria, unspecified: Secondary | ICD-10-CM | POA: Diagnosis not present

## 2018-09-01 DIAGNOSIS — R1311 Dysphagia, oral phase: Secondary | ICD-10-CM | POA: Diagnosis not present

## 2018-09-01 DIAGNOSIS — I10 Essential (primary) hypertension: Secondary | ICD-10-CM | POA: Diagnosis not present

## 2018-09-01 DIAGNOSIS — T796XXA Traumatic ischemia of muscle, initial encounter: Secondary | ICD-10-CM | POA: Diagnosis not present

## 2018-09-01 DIAGNOSIS — E039 Hypothyroidism, unspecified: Secondary | ICD-10-CM | POA: Diagnosis not present

## 2018-09-01 DIAGNOSIS — R2689 Other abnormalities of gait and mobility: Secondary | ICD-10-CM | POA: Diagnosis not present

## 2018-09-01 DIAGNOSIS — E785 Hyperlipidemia, unspecified: Secondary | ICD-10-CM | POA: Diagnosis not present

## 2018-09-01 DIAGNOSIS — G9341 Metabolic encephalopathy: Secondary | ICD-10-CM | POA: Diagnosis not present

## 2018-09-01 DIAGNOSIS — G2581 Restless legs syndrome: Secondary | ICD-10-CM

## 2018-09-01 DIAGNOSIS — R82998 Other abnormal findings in urine: Secondary | ICD-10-CM | POA: Diagnosis not present

## 2018-09-01 DIAGNOSIS — L309 Dermatitis, unspecified: Secondary | ICD-10-CM | POA: Diagnosis not present

## 2018-09-01 DIAGNOSIS — S8002XA Contusion of left knee, initial encounter: Secondary | ICD-10-CM | POA: Diagnosis not present

## 2018-09-01 DIAGNOSIS — I4891 Unspecified atrial fibrillation: Secondary | ICD-10-CM

## 2018-09-01 DIAGNOSIS — Z7902 Long term (current) use of antithrombotics/antiplatelets: Secondary | ICD-10-CM | POA: Diagnosis not present

## 2018-09-01 DIAGNOSIS — R001 Bradycardia, unspecified: Secondary | ICD-10-CM | POA: Diagnosis not present

## 2018-09-01 DIAGNOSIS — Z7982 Long term (current) use of aspirin: Secondary | ICD-10-CM | POA: Diagnosis not present

## 2018-09-01 DIAGNOSIS — M6281 Muscle weakness (generalized): Secondary | ICD-10-CM | POA: Diagnosis not present

## 2018-09-01 DIAGNOSIS — M6282 Rhabdomyolysis: Secondary | ICD-10-CM | POA: Diagnosis not present

## 2018-09-01 DIAGNOSIS — M1990 Unspecified osteoarthritis, unspecified site: Secondary | ICD-10-CM | POA: Diagnosis not present

## 2018-09-01 DIAGNOSIS — Z8673 Personal history of transient ischemic attack (TIA), and cerebral infarction without residual deficits: Secondary | ICD-10-CM | POA: Diagnosis not present

## 2018-09-01 DIAGNOSIS — N390 Urinary tract infection, site not specified: Secondary | ICD-10-CM | POA: Diagnosis not present

## 2018-09-01 DIAGNOSIS — M2550 Pain in unspecified joint: Secondary | ICD-10-CM | POA: Diagnosis not present

## 2018-09-01 DIAGNOSIS — L959 Vasculitis limited to the skin, unspecified: Secondary | ICD-10-CM | POA: Diagnosis not present

## 2018-09-01 DIAGNOSIS — R2681 Unsteadiness on feet: Secondary | ICD-10-CM | POA: Diagnosis not present

## 2018-09-01 DIAGNOSIS — R7989 Other specified abnormal findings of blood chemistry: Secondary | ICD-10-CM | POA: Diagnosis not present

## 2018-09-01 DIAGNOSIS — J4520 Mild intermittent asthma, uncomplicated: Secondary | ICD-10-CM | POA: Diagnosis not present

## 2018-09-01 DIAGNOSIS — I480 Paroxysmal atrial fibrillation: Secondary | ICD-10-CM | POA: Diagnosis not present

## 2018-09-01 DIAGNOSIS — T796XXD Traumatic ischemia of muscle, subsequent encounter: Secondary | ICD-10-CM | POA: Diagnosis not present

## 2018-09-01 DIAGNOSIS — R410 Disorientation, unspecified: Secondary | ICD-10-CM | POA: Diagnosis not present

## 2018-09-01 LAB — BASIC METABOLIC PANEL
Anion gap: 7 (ref 5–15)
BUN: 12 mg/dL (ref 8–23)
CO2: 28 mmol/L (ref 22–32)
CREATININE: 0.7 mg/dL (ref 0.44–1.00)
Calcium: 8.5 mg/dL — ABNORMAL LOW (ref 8.9–10.3)
Chloride: 102 mmol/L (ref 98–111)
GFR calc Af Amer: >60 mL/min (ref >60)
GFR calc non Af Amer: >60 mL/min (ref >60)
GLUCOSE: 119 mg/dL — AB (ref 70–99)
Potassium: 3.9 mmol/L (ref 3.5–5.1)
Sodium: 137 mmol/L (ref 135–145)

## 2018-09-01 MED ORDER — METOPROLOL TARTRATE 50 MG PO TABS: 25.0000 mg | ORAL_TABLET | Freq: Two times a day (BID) | ORAL | 0 refills | Status: DC

## 2018-09-01 NOTE — Discharge Summary (Signed)
Physician Discharge Summary  Kimberly Bass:[id number] DOB: [birth year] DOA: 08/28/2018  PCP: Merri Brunette, MD  Admit date: 08/28/2018 Discharge date: 09/01/2018  Admitted From:home  Disposition:  SNF   Recommendations for Outpatient Follow-up:  1. F/u BP and HR regularly and increase Metoprolol if needed 2. Continue daily dressing changes of right knee with nonstick gauze after washing with warm water  Discharge Condition:  stable   CODE STATUS:  DNR   Diet recommendation:  Heart healthy   Discharge Diagnoses:  Principal Problem:   Syncope Active Problems:   Rhabdomyolysis   Bradycardia/ hypotension   Elevated troponin   PAF (paroxysmal atrial fibrillation) (HCC)   CAD (coronary artery disease)   Hypothyroidism   Traumatic hematoma of knee, left, initial encounter   RLS (restless legs syndrome)       Brief Summary: Kimberly Bass is a46 year old female at home with past medical history significant for CAD, hypertension, hyperlipidemia, hypothyroidism, previous TIA who presented to the hospital after being found on the floor at home. Apparently, patient went up to go use the bathroom around 10:51 PM, fell and could not get up. Son found her on the floor the next morning. During transportation by EMS, patient was found to be severely bradycardic, required external pacemaker on route. In the emergency department, her bradycardia had resolved. On admission, patient was quite obtunded & CK was elevated as well as troponin. She was admitted for lethargy, rhabdomyolysis and elevated troponin. Overnight, patient had acute decompensation with worsening mentation as well as hypotension. At that time, son decided to transition patient to comfort measures.   Hospital Course:   Syncope- Bradycardia and hypotension with h/o HTN - Syncope likely due to bradycardia, hypotension with dehydration-  CVA/TIA (in setting of A-fib) is a low probability but as management is not  going to be changed, I have not obtained an MRI - HR in 20s-60s when picked up by EMS- in 50s in ED with BP in 70s/30s- - holding Metoprolol, Norvasc and Imdur and Triamterene/HCTZ - HR has improved to 70-80   Active Problems: Paroxysmal A-fib- new finding - noted in hospital to be in PAF but rate has remained controlled despite holding Metoprolol - on ASA/Plavix at home for CAD - CHA2DS2-VASc Score 5- she follows with Dr Elease Hashimoto who has seen her today and does not recommend starting an DOAC which I agree with - Metoprolol has been on hold due to bradycardia and hypotension on admission - HR rising and BP now 164/83- will resume Metoprolol at which is a lower dose than she was taking at home    Rhabdomyolysis - CK 1438 - due to laying on the floor- improved    Elevated troponin (0.13> 0.03)  h/o  CAD (coronary artery disease) which is being medically managed - likely a byproduct of Rhabdo rather than an acute MI, I have explained this to the patient's son but he would like a work up to ensure she did not have an MI -  ECHO done  shows no wall motion abnormalities and normal EF- diastolic function could not be determined due to A-fib  Hypokalemia/ hypomagnesemia  - replaced  Left knee pain- traumatic hemorrhage into knee -   Due to fall - xray shows an effusion and thus I consulted ortho- about 15 cc of blood removed by ortho PA on 3/5 - she has little more movement in the knee now but it remains swollen with a large abrasion on the surface  RLS -  Myrbetriq   Hypothyroidism - resume Synthroid   Code Status: DNR Family Communication: son at bedside Consultants:   cardiology Procedures:   2 D ECHO 1. The left ventricle has hyperdynamic systolic function, with an ejection fraction of >65%. There is moderately increased left ventricular wall thickness. Left ventricular diastology could not be evaluated secondary to atrial fibrillation. No evidence  of left ventricular  regional wall motion abnormalities. 2. The right ventricle has normal systolic function. The cavity was normal. There is no increase in right ventricular wall thickness. 3. The mitral valve is normal in structure. 4. The aortic valve was not well visualized. 5. The ascending aorta and aortic root are normal in size and structure. 6. No evidence of left ventricular regional wall motion abnormalities.    Discharge Exam: Vitals:   09/01/18 0427 09/01/18 0923  BP: (!) 134/55 (!) 164/83  Pulse: 99 (!) 104  Resp: 18 18  Temp: (!) 97.5 F (36.4 C) 97.9 F (36.6 C)  SpO2: 97% 98%   Vitals:   08/31/18 2323 09/01/18 0136 09/01/18 0427 09/01/18 0923  BP:   (!) 134/55 (!) 164/83  Pulse: 98 96 99 (!) 104  Resp:   18 18  Temp:   (!) 97.5 F (36.4 C) 97.9 F (36.6 C)  TempSrc:    Oral  SpO2:   97% 98%  Weight:   69.4 kg   Height:        General: Pt is alert, awake, not in acute distress Cardiovascular: RRR, S1/S2 +, no rubs, no gallops Respiratory: CTA bilaterally, no wheezing, no rhonchi Abdominal: Soft, NT, ND, bowel sounds + MSK- left knee swollen with large abrasion Extremities: no edema, no cyanosis   Discharge Instructions  Discharge Instructions    Diet - low sodium heart healthy   Complete by:  As directed    Increase activity slowly   Complete by:  As directed      Allergies as of 09/01/2018      Reactions   Codeine Nausea Only   Meperidine Hcl Nausea Only   Morphine Nausea Only   Penicillins Nausea Only   Has patient had a PCN reaction causing immediate rash, facial/tongue/throat swelling, SOB or lightheadedness with hypotension: Unk Has patient had a PCN reaction causing severe rash involving mucus membranes or skin necrosis: Unk Has patient had a PCN reaction that required hospitalization: Unk Has patient had a PCN reaction occurring within the last 10 years: >50 years ago If all of the above answers are "NO", then may proceed with Cephalosporin use.       Medication List    STOP taking these medications   amLODipine 2.5 MG tablet Commonly known as:  NORVASC   isosorbide mononitrate 30 MG 24 hr tablet Commonly known as:  IMDUR   isosorbide mononitrate 60 MG 24 hr tablet Commonly known as:  IMDUR   metoprolol tartrate 100 MG tablet Commonly known as:  LOPRESSOR   metoprolol tartrate 50 MG tablet Commonly known as:  LOPRESSOR   traZODone 50 MG tablet Commonly known as:  DESYREL   triamterene-hydrochlorothiazide 37.5-25 MG tablet Commonly known as:  MAXZIDE-25     TAKE these medications   aspirin EC 81 MG tablet Take 81 mg by mouth at bedtime.   baclofen 20 MG tablet Commonly known as:  LIORESAL Take 20-30 mg by mouth See admin instructions. Take 1 1/2 tablets (30 mg) by mouth every morning, 1 tablet (20 mg) in the afternoon and 1 1/2 tablets (30 mg)  at night   Calcium 500+D High Potency 500-400 MG-UNIT tablet Generic drug:  calcium-vitamin D Take 1 tablet by mouth 2 (two) times daily.   clopidogrel 75 MG tablet Commonly known as:  PLAVIX TAKE 1 TABLET BY MOUTH  DAILY What changed:  when to take this   Cyanocobalamin 2500 MCG Tabs Take 2,500 mcg by mouth daily. Vitamin B12   gabapentin 400 MG capsule Commonly known as:  NEURONTIN TAKE 1 CAPSULE (400 MG TOTAL) BY MOUTH 4 (FOUR) TIMES DAILY. What changed:  additional instructions   levothyroxine 75 MCG tablet Commonly known as:  SYNTHROID, LEVOTHROID Take 75 mcg by mouth daily.   mirabegron ER 25 MG Tb24 tablet Commonly known as:  MYRBETRIQ Take 25 mg by mouth daily.   multivitamin with minerals Tabs tablet Take 1 tablet by mouth daily.   Nitrostat 0.4 MG SL tablet Generic drug:  nitroGLYCERIN DISSOLVE 1 TAB UNDER THE  TONGUE EVERY 5 MIN AS  NEEDED FOR CHEST PAIN. MAX  3 TOTAL DOSES/15MIN. 911 IF NO RELIEF What changed:    how much to take  how to take this  when to take this  reasons to take this  additional instructions   ranitidine 150 MG  tablet Commonly known as:  ZANTAC Take 150 mg by mouth 2 (two) times daily.   simvastatin 20 MG tablet Commonly known as:  ZOCOR Take 20 mg by mouth at bedtime.   Tylenol Arthritis Pain 650 MG CR tablet Generic drug:  acetaminophen Take 1,300 mg by mouth 3 (three) times daily.      Contact information for after-discharge care    Destination    [facility] .   Service:  Skilled Nursing Contact information: [address] [place] [address] [phone]             Allergies  Allergen Reactions  . Codeine Nausea Only  . Meperidine Hcl Nausea Only  . Morphine Nausea Only  . Penicillins Nausea Only    Has patient had a PCN reaction causing immediate rash, facial/tongue/throat swelling, SOB or lightheadedness with hypotension: Unk Has patient had a PCN reaction causing severe rash involving mucus membranes or skin necrosis: Unk Has patient had a PCN reaction that required hospitalization: Unk Has patient had a PCN reaction occurring within the last 10 years: >50 years ago If all of the above answers are "NO", then may proceed with Cephalosporin use.      Procedures/Studies:  Aspiration of left knee  Ct Head Wo Contrast  Result Date: 08/28/2018 CLINICAL DATA:  Found on floor at independent living facility by family, patient does remember falling, history coronary artery disease post MI, hypertension, seizures, EXAM: CT HEAD WITHOUT CONTRAST CT CERVICAL SPINE WITHOUT CONTRAST TECHNIQUE: Multidetector CT imaging of the head and cervical spine was performed following the standard protocol without intravenous contrast. Multiplanar CT image reconstructions of the cervical spine were also generated. COMPARISON:  12/12/2017 FINDINGS: CT HEAD FINDINGS Brain: Generalized atrophy. Normal ventricular morphology. No midline shift or mass effect. Small vessel chronic ischemic changes of deep cerebral white matter. No intracranial hemorrhage,  mass lesion, evidence of acute infarction, or extra-axial fluid collection. Vascular: Atherosclerotic calcifications of internal carotid and vertebral arteries at skull base Skull: Intact Sinuses/Orbits: Mucosal retention cyst RIGHT maxillary sinus. Otherwise clear. Other: N/A CT CERVICAL SPINE FINDINGS Alignment: Normal Skull base and vertebrae: Osseous demineralization. Visualized skull base intact. Prior anterior fusion C5-C7. Multilevel degenerative disc disease changes with disc space narrowing and  endplate spur formation. Multilevel facet degenerative changes. Vertebral body heights maintained without fracture or subluxation. Minimal scattered motion artifacts. Endplate spurring at C6-C7 causes AP narrowing of the spinal canal and probable cord compression greater LEFT of midline. Hardware appears intact. Soft tissues and spinal canal: Prevertebral soft tissues normal thickness. Atherosclerotic calcifications at carotid bifurcations. Remaining cervical soft tissues unremarkable. Disc levels:  As above Upper chest: Lung apices clear Other: N/A IMPRESSION: Mild atrophy with small vessel chronic ischemic changes of deep cerebral white matter. No acute intracranial abnormalities. Degenerative disc and facet disease changes of the cervical spine. Prior anterior cervical fusion C5-C7. No acute cervical spine abnormalities. Electronically Signed   By: Ulyses Southward M.D.   On: 08/28/2018 15:57   Ct Cervical Spine Wo Contrast  Result Date: 08/28/2018 CLINICAL DATA:  Found on floor at independent living facility by family, patient does remember falling, history coronary artery disease post MI, hypertension, seizures, EXAM: CT HEAD WITHOUT CONTRAST CT CERVICAL SPINE WITHOUT CONTRAST TECHNIQUE: Multidetector CT imaging of the head and cervical spine was performed following the standard protocol without intravenous contrast. Multiplanar CT image reconstructions of the cervical spine were also generated. COMPARISON:   12/12/2017 FINDINGS: CT HEAD FINDINGS Brain: Generalized atrophy. Normal ventricular morphology. No midline shift or mass effect. Small vessel chronic ischemic changes of deep cerebral white matter. No intracranial hemorrhage, mass lesion, evidence of acute infarction, or extra-axial fluid collection. Vascular: Atherosclerotic calcifications of internal carotid and vertebral arteries at skull base Skull: Intact Sinuses/Orbits: Mucosal retention cyst RIGHT maxillary sinus. Otherwise clear. Other: N/A CT CERVICAL SPINE FINDINGS Alignment: Normal Skull base and vertebrae: Osseous demineralization. Visualized skull base intact. Prior anterior fusion C5-C7. Multilevel degenerative disc disease changes with disc space narrowing and endplate spur formation. Multilevel facet degenerative changes. Vertebral body heights maintained without fracture or subluxation. Minimal scattered motion artifacts. Endplate spurring at C6-C7 causes AP narrowing of the spinal canal and probable cord compression greater LEFT of midline. Hardware appears intact. Soft tissues and spinal canal: Prevertebral soft tissues normal thickness. Atherosclerotic calcifications at carotid bifurcations. Remaining cervical soft tissues unremarkable. Disc levels:  As above Upper chest: Lung apices clear Other: N/A IMPRESSION: Mild atrophy with small vessel chronic ischemic changes of deep cerebral white matter. No acute intracranial abnormalities. Degenerative disc and facet disease changes of the cervical spine. Prior anterior cervical fusion C5-C7. No acute cervical spine abnormalities. Electronically Signed   By: Ulyses Southward M.D.   On: 08/28/2018 15:57   Dg Chest Port 1 View  Result Date: 08/28/2018 CLINICAL DATA:  Shortness of breath. EXAM: PORTABLE CHEST 1 VIEW COMPARISON:  April 27, 2017 FINDINGS: No pneumothorax. A transcutaneous pacer overlies left-sided chest. The heart, hila, mediastinum, lungs, and pleura are unremarkable. Minimal atelectasis  in the left base. IMPRESSION: No active disease. Electronically Signed   By: Gerome Sam III M.D   On: 08/28/2018 15:07   Dg Knee Complete 4 Views Left  Result Date: 08/30/2018 CLINICAL DATA:  The left knee anterior abrasions, swelling and pain following a fall 5 days ago EXAM: LEFT KNEE - COMPLETE 4+ VIEW COMPARISON:  None. FINDINGS: Mild lateral joint space narrowing. Medial and lateral meniscal calcifications. Minimal spur formation involving all 3 joint compartments. Small to moderate-sized effusion. No fracture or dislocation seen. IMPRESSION: 1. Small to moderate-sized effusion. 2. No fracture. 3. Minimal tricompartmental degenerative changes. 4. Chondrocalcinosis. Electronically Signed   By: Beckie Salts M.D.   On: 08/30/2018 19:48     The results of significant diagnostics  from this hospitalization (including imaging, microbiology, ancillary and laboratory) are listed below for reference.     Microbiology: No results found for this or any previous visit (from the past 240 hour(s)).   Labs: BNP (last 3 results) No results for input(s): BNP in the last 8760 hours. Basic Metabolic Panel: Recent Labs  Lab 08/28/18 1442 08/30/18 0853 08/31/18 0627 09/01/18 0432  NA 138 137 138 137  K 3.3* 3.1* 3.4* 3.9  CL 100 97* 98 102  CO2 24 28 30 28   GLUCOSE 127* 162* 131* 119*  BUN 18 13 11 12   CREATININE 0.92 0.88 0.83 0.70  CALCIUM 10.0 8.8* 8.7* 8.5*  MG  --  1.6* 2.2  --    Liver Function Tests: Recent Labs  Lab 08/28/18 1442  AST 55*  ALT 23  ALKPHOS 42  BILITOT 0.7  PROT 6.8  ALBUMIN 3.8   No results for input(s): LIPASE, AMYLASE in the last 168 hours. No results for input(s): AMMONIA in the last 168 hours. CBC: Recent Labs  Lab 08/28/18 1442  WBC 10.0  NEUTROABS 7.5  HGB 14.6  HCT 45.8  MCV 88.6  PLT 183   Cardiac Enzymes: Recent Labs  Lab 08/28/18 1504 08/30/18 0853  CKTOTAL 1,438* 364*  TROPONINI  --  0.03*   BNP: Invalid input(s):  POCBNP CBG: No results for input(s): GLUCAP in the last 168 hours. D-Dimer No results for input(s): DDIMER in the last 72 hours. Hgb A1c No results for input(s): HGBA1C in the last 72 hours. Lipid Profile No results for input(s): CHOL, HDL, LDLCALC, TRIG, CHOLHDL, LDLDIRECT in the last 72 hours. Thyroid function studies No results for input(s): TSH, T4TOTAL, T3FREE, THYROIDAB in the last 72 hours.  Invalid input(s): FREET3 Anemia work up No results for input(s): VITAMINB12, FOLATE, FERRITIN, TIBC, IRON, RETICCTPCT in the last 72 hours. Urinalysis    Component Value Date/Time   COLORURINE YELLOW 08/01/2018 1438   APPEARANCEUR CLOUDY (A) 08/01/2018 1438   LABSPEC 1.013 08/01/2018 1438   PHURINE 8.0 08/01/2018 1438   GLUCOSEU NEGATIVE 08/01/2018 1438   HGBUR NEGATIVE 08/01/2018 1438   BILIRUBINUR NEGATIVE 08/01/2018 1438   KETONESUR NEGATIVE 08/01/2018 1438   PROTEINUR NEGATIVE 08/01/2018 1438   NITRITE NEGATIVE 08/01/2018 1438   LEUKOCYTESUR TRACE (A) 08/01/2018 1438   Sepsis Labs Invalid input(s): PROCALCITONIN,  WBC,  LACTICIDVEN Microbiology No results found for this or any previous visit (from the past 240 hour(s)).   Time coordinating discharge in minutes: 65  SIGNED:   Calvert Cantor, MD  [facility] 09/01/2018, 11:50 AM Pager   If 7PM-7AM, please contact night-coverage [email or web address] Password [email or web address]

## 2018-09-01 NOTE — Progress Notes (Signed)
Patient was discharged to [facility]. Called [facility] and spoke with Josselyn RN to give report.  Peripheral Iv removed , clean dry and intact, pressure and dressing applied. Patient dressed and ready to be picked up. Discharge packet given to [facility].

## 2018-09-01 NOTE — Progress Notes (Signed)
     Discussed the hospital admission with patient and her son  I agree with not starting oral anticoagulation at this point.   She has become very weak and has fallen several times Would also hold Plavix for now   If she regains significant strength and remains stable, we can readdress this issue at a later time   Agree that she will need more care than she is currently getting . She will be going to [facility] for 2 weeks and then son will decide if she will come to live with him or go to SNF long term .  Will have her follow up with me or APP in several months     Kristeen Miss, MD  09/01/2018 10:52 AM    [facility] [address] [place],  [address] [place], [place]  [address] Pager [phone] Phone: [phone]; Fax: [phone]

## 2018-09-01 NOTE — Clinical Social Work Note (Addendum)
Insurance authorization still pending. Patient and son aware. Patient will transport to SNF by [facility].  Charlynn Court, CSW [phone]  1:53 pm Insurance authorization approved. Waiting on admissions coordinator to call and say transport can be arranged.  Charlynn Court, CSW [phone]

## 2018-09-01 NOTE — Progress Notes (Signed)
Occupational Therapy Treatment Patient Details Name: Kimberly Bass MRN: [id number] DOB: [birth year] Today's Date: 09/01/2018    History of present illness Pt is a 85 y.o. female admitted 08/28/18 after falling at home and being found down next morning by son; bradycardic with EMS transport requiring external pacemaker. Head and cervical CT negative for acute abnormality. Worked up for mild rhabdomyolysis and acute metabolic encephalopathy. Pt with worsening L knee pain; xray showed effusion, no fx. PMH includes HTN, CAD, TIA.   OT comments  Pt making limited progress towards OT goals this session. Limited by pain. Pt able to perform bed mobility at mod A +2 for rolling to complete LB dressing (Max +2) at bed level this session. Min A for UB dressing. Pt limited to bed level session due to pending DC to SNF for continued therapy. Pt was able to participate in BLE leg exercises. Current POC remains appropriate. Thank you for the opportunity to serve this patient.    Follow Up Recommendations  SNF;Supervision/Assistance - 24 hour    Equipment Recommendations  3 in 1 bedside commode    Recommendations for Other Services      Precautions / Restrictions Precautions Precautions: Fall Restrictions Weight Bearing Restrictions: No       Mobility Bed Mobility Overal bed mobility: Needs Assistance Bed Mobility: Rolling Rolling: Max assist;+2 for safety/equipment         General bed mobility comments: Cues to initiate movement, increased time and effort grimacing/moaning due to LLE pain throughout; use of bed pad to assist with hips  Transfers                      Balance                                           ADL either performed or assessed with clinical judgement   ADL Overall ADL's : Needs assistance/impaired     Grooming: Wash/dry face;Wash/dry hands;Set up;Bed level           Upper Body Dressing : Minimal assistance;Sitting Upper Body  Dressing Details (indicate cue type and reason): assist due to lines Lower Body Dressing: +2 for physical assistance;Maximal assistance;Bed level Lower Body Dressing Details (indicate cue type and reason): rolling to don paper scrubs     Toileting- Clothing Manipulation and Hygiene: Maximal assistance;Bed level Toileting - Clothing Manipulation Details (indicate cue type and reason): Pt utilizing purewick     Functional mobility during ADLs: (NT this session)       Vision       Perception     Praxis      Cognition Arousal/Alertness: Awake/alert Behavior During Therapy: WFL for tasks assessed/performed Overall Cognitive Status: Impaired/Different from baseline Area of Impairment: Attention;Following commands;Safety/judgement;Awareness;Problem solving;Memory                   Current Attention Level: Selective Memory: Decreased short-term memory Following Commands: Follows one step commands with increased time;Follows multi-step commands inconsistently Safety/Judgement: Decreased awareness of deficits;Decreased awareness of safety Awareness: Emergent Problem Solving: Slow processing;Requires verbal cues General Comments: able to follow simple commands throughout session for dressing        Exercises Exercises: Other exercises Other Exercises Other Exercises: AAROM LAQ; L knee flexion/heel slides limited to ~60' due to knee pain Other Exercises: heel pumps x10 Other Exercises: hip abduction BLE x10 out to approx '  45 Other Exercises: quad activation (pushing knee into bed) x10 BLE   Shoulder Instructions       General Comments no family present    Pertinent Vitals/ Pain       Pain Assessment: Faces Faces Pain Scale: Hurts even more Pain Location: L knee Pain Descriptors / Indicators: Grimacing;Guarding;Sore;Moaning Pain Intervention(s): Monitored during session;Limited activity within patient's tolerance;Repositioned  Home Living                                           Prior Functioning/Environment              Frequency  Min 2X/week        Progress Toward Goals  OT Goals(current goals can now be found in the care plan section)  Progress towards OT goals: Progressing toward goals  Acute Rehab OT Goals Patient Stated Goal: Be able to return home OT Goal Formulation: With patient Time For Goal Achievement: 09/13/18 Potential to Achieve Goals: Good  Plan Discharge plan remains appropriate;Frequency remains appropriate    Co-evaluation                 AM-PAC OT "6 Clicks" Daily Activity     Outcome Measure   Help from another person eating meals?: None Help from another person taking care of personal grooming?: A Little Help from another person toileting, which includes using toliet, bedpan, or urinal?: Total Help from another person bathing (including washing, rinsing, drying)?: A Lot Help from another person to put on and taking off regular upper body clothing?: A Little Help from another person to put on and taking off regular lower body clothing?: Total 6 Click Score: 14    End of Session Equipment Utilized During Treatment: Oxygen  OT Visit Diagnosis: Unsteadiness on feet (R26.81);Pain;Muscle weakness (generalized) (M62.81);Other symptoms and signs involving cognitive function Pain - Right/Left: Left Pain - part of body: Knee   Activity Tolerance Patient limited by pain   Patient Left in bed;with call bell/phone within reach;with bed alarm set   Nurse Communication Mobility status        Time: 0109-3235 OT Time Calculation (min): 26 min  Charges: OT General Charges $OT Visit: 1 Visit OT Treatments $Self Care/Home Management : 23-37 mins  Sherryl Manges OTR/L Acute Rehabilitation Services Pager: [phone] Office: [phone]  Kimberly Bass 09/01/2018, 2:18 PM

## 2018-09-01 NOTE — Clinical Social Work Placement (Signed)
   CLINICAL SOCIAL WORK PLACEMENT  NOTE  Date:  09/01/2018  Patient Details  Name: Kimberly Bass MRN: [id number] Date of Birth: 1925/10/06  Clinical Social Work is seeking post-discharge placement for this patient at the Skilled  Nursing Facility level of care (*CSW will initial, date and re-position this form in  chart as items are completed):      Patient/family provided with [facility] Clinical Social Work Department's list of facilities offering this level of care within the geographic area requested by the patient (or if unable, by the patient's family).      Patient/family informed of their freedom to choose among providers that offer the needed level of care, that participate in Medicare, Medicaid or managed care program needed by the patient, have an available bed and are willing to accept the patient.      Patient/family informed of [place]'s ownership interest in [facility] and [facility], as well as of the fact that they are under no obligation to receive care at these facilities.  PASRR submitted to EDS on 08/30/18     PASRR number received on       Existing PASRR number confirmed on 08/30/18     FL2 transmitted to all facilities in geographic area requested by pt/family on 08/30/18     FL2 transmitted to all facilities within larger geographic area on       Patient informed that his/her managed care company has contracts with or will negotiate with certain facilities, including the following:        Yes   Patient/family informed of bed offers received.  Patient chooses bed at [facility]     Physician recommends and patient chooses bed at      Patient to be transferred to [facility] on 09/01/18.  Patient to be transferred to facility by [facility]     Patient family notified on 09/01/18 of transfer.  Name of family member notified:  Baker Pierini     PHYSICIAN Please prepare prescriptions     Additional Comment:     _______________________________________________ Margarito Liner, LCSW 09/01/2018, 1:54 PM

## 2018-09-01 NOTE — Clinical Social Work Note (Signed)
CSW facilitated patient discharge including contacting patient family and facility to confirm patient discharge plans. Clinical information faxed to facility and family agreeable with plan. CSW arranged ambulance transport via [facility] to [facility]. RN to call report prior to discharge ([phone]).  CSW will sign off for now as social work intervention is no longer needed. Please consult us again if new needs arise.  Nanea Jared, CSW [phone]   

## 2018-09-04 ENCOUNTER — Encounter: Payer: Self-pay | Admitting: Internal Medicine

## 2018-09-04 ENCOUNTER — Non-Acute Institutional Stay (SKILLED_NURSING_FACILITY): Payer: Medicare Other | Admitting: Internal Medicine

## 2018-09-04 DIAGNOSIS — T796XXD Traumatic ischemia of muscle, subsequent encounter: Secondary | ICD-10-CM

## 2018-09-04 DIAGNOSIS — J45909 Unspecified asthma, uncomplicated: Secondary | ICD-10-CM | POA: Insufficient documentation

## 2018-09-04 DIAGNOSIS — I480 Paroxysmal atrial fibrillation: Secondary | ICD-10-CM

## 2018-09-04 DIAGNOSIS — E039 Hypothyroidism, unspecified: Secondary | ICD-10-CM

## 2018-09-04 DIAGNOSIS — S8002XA Contusion of left knee, initial encounter: Secondary | ICD-10-CM

## 2018-09-04 DIAGNOSIS — R001 Bradycardia, unspecified: Secondary | ICD-10-CM | POA: Diagnosis not present

## 2018-09-04 DIAGNOSIS — R55 Syncope and collapse: Secondary | ICD-10-CM | POA: Diagnosis not present

## 2018-09-04 DIAGNOSIS — J4520 Mild intermittent asthma, uncomplicated: Secondary | ICD-10-CM

## 2018-09-04 NOTE — Assessment & Plan Note (Signed)
DuoNeb treatments every 4-6 as needed Supplemental oxygen if O2 sats less than 90%

## 2018-09-04 NOTE — Progress Notes (Signed)
NURSING HOME LOCATION:  [facility] ROOM NUMBER:  [id number]  CODE STATUS: DNR  PCP:  Merri Brunette MD  This is a comprehensive admission note to [facility] performed on this date less than 30 days from date of admission. Included are preadmission medical/surgical history; reconciled medication list; family history; social history and comprehensive review of systems.  Corrections and additions to the records were documented. Comprehensive physical exam was also performed. Additionally a clinical summary was entered for each active diagnosis pertinent to this admission in the Problem List to enhance continuity of care.  HPI: Patient was hospitalized 3/2-09/01/2018 admitted from home after being found in the bathroom floor overnight.  It is believed that she had a possible syncopal episode approximately 10:51 PM but was not found by her son until next morning.  EMS documented severe bradycardia necessitating external pacemaker in route.  In the ED bradycardia had resolved.  Blood pressure was in the 70s over 30s.  Metoprolol, Norvasc, Imdur, and triamterene HCTZ were held.  At admission the patient was markedly obtunded with CK elevation to 1438 as well as troponin elevation to 0.13.  Troponin elevation was felt to be related to the rhabdomyolysis rather than an ACS although the patient does have coronary artery disease which is being managed medically.  Her son requested a rule out of MI.  Echo revealed no wall abnormalities and normal ejection fraction.  The PAF was felt to be possibly related to diastolic dysfunction.   Mentation actually worsened initially as did the hypotension.  The son agreed to transition to comfort measures. It was felt that the syncope was most likely due to bradycardia with hypotension and dehydration.  Heart rate varied from the 20s to the 60s.  As comfort status was elected, MRI was not pursued. PAF was noted but rate was adequately controlled despite being off the  metoprolol.  Chads vasc score was 5.  Cardiology did not recommend starting the DOAC. Hypotension did resolve and metoprolol at low dose was reinitiated with systolics of over 160. The patient experienced pain in the left knee and had a large abrasion over the dorsal aspect; x-ray revealed an effusion.  Orthopedics aspirated 15 cc of blood on 3/5.  Past medical and surgical history:  includes RLS, hypothyroidism, history of possible seizures, myocardial infarction, migraine headaches, dyslipidemia, & history of TIA. Surgeries include cardiac catheterization 2007, breast biopsy, anterior cervical disc surgery, and abdominal hysterectomy  Social history: Nondrinker, never smoked  Family history: Reviewed.  Strong family history of heart disease   Review of systems: She stated that the syncope occurred when she rose from the commode.  She denied any difficulty starting the urinary stream and was not constipated.  She denied any cardiac or neurologic prodrome. She believes she was told that she had a "stomach virus" recently. She does state that she passed out in June 2019 while she was standing in her hallway.  Again there was no definite prodrome. She states she is on 2 medications, 1 of which is gabapentin for her restless leg syndrome.  Constitutional: No fever, significant weight change  Eyes: No redness, discharge, pain, vision change ENT/mouth: No nasal congestion, purulent discharge, earache, change in hearing, sore throat  Cardiovascular: No chest pain, palpitations, paroxysmal nocturnal dyspnea, claudication, edema  Respiratory: No cough, sputum production, hemoptysis, DOE, significant snoring, apnea Gastrointestinal: No heartburn, dysphagia, abdominal pain, nausea /vomiting, rectal bleeding, melena, change in bowels ( see comment above) Genitourinary: No dysuria, hematuria, pyuria, incontinence, nocturia  Musculoskeletal: No joint stiffness, joint swelling, weakness Dermatologic: No  rash, pruritus, change in appearance of skin Neurologic: No dizziness, headache, seizures, numbness, tingling Psychiatric: No significant anxiety, depression, insomnia, anorexia Endocrine: No change in hair/skin/nails, excessive thirst, excessive hunger, excessive urination  Hematologic/lymphatic: No significant bruising, lymphadenopathy, abnormal bleeding Allergy/immunology: No itchy/watery eyes, significant sneezing, urticaria, angioedema  Physical exam:  Pertinent or positive findings: She exhibits a prominent stare.  She has a singsong, halting cadence to her voice.  Relative mandibular micrognathia is present.  She has low-grade expiratory musical rhonchi.  She also has rhonchi over the upper airway.Tachyarrhythmia was suggested on auscultation of the chest.  Abdomen is protuberant.  She has 1/2+ pitting edema at the sock line.  Posterior tibial is are stronger than the dorsalis pedis pulses. L knee bandaged.  General appearance: Adequately nourished; no acute distress, increased work of breathing is present.   Lymphatic: No lymphadenopathy about the head, neck, axilla. Eyes: No conjunctival inflammation or lid edema is present. There is no scleral icterus. Ears:  External ear exam shows no significant lesions or deformities.   Nose:  External nasal examination shows no deformity or inflammation. Nasal mucosa are pink and moist without lesions, exudates Oral exam: Lips and gums are healthy appearing.There is no oropharyngeal erythema or exudate. Neck:  No thyromegaly, masses, tenderness noted.    Heart:  No gallop, murmur, click, rub.  Lungs:  without wheezes, rales, rubs. Abdomen: Bowel sounds are normal.  Abdomen is soft and nontender with no organomegaly, hernias, masses. GU: Deferred  Extremities:  No cyanosis, clubbing. Neurologic exam:  Strength equal  in upper & lower extremities. Balance, Rhomberg, finger to nose testing could not be completed due to clinical state Skin: Warm &  dry w/o tenting. No significant lesions or rash.  See clinical summary under each active problem in the Problem List with associated updated therapeutic plan

## 2018-09-04 NOTE — Progress Notes (Signed)
   NURSING HOME LOCATION:  [facility] ROOM NUMBER:  211/A  CODE STATUS: DNR  PCP: Merri Brunette MD   This is a comprehensive admission note to [facility] performed on this date less than 30 days from date of admission. Included are preadmission medical/surgical history; reconciled medication list; family history; social history and comprehensive review of systems.  Corrections and additions to the records were documented. Comprehensive physical exam was also performed. Additionally a clinical summary was entered for each active diagnosis pertinent to this admission in the Problem List to enhance continuity of care.  HPI:  Past medical and surgical history:  Social history:  Family history:   Review of systems:  Could not be completed due to dementia. Date given as Constitutional: No fever, significant weight change, fatigue  Eyes: No redness, discharge, pain, vision change ENT/mouth: No nasal congestion, purulent discharge, earache, change in hearing, sore throat  Cardiovascular: No chest pain, palpitations, paroxysmal nocturnal dyspnea, claudication, edema  Respiratory: No cough, sputum production, hemoptysis, DOE, significant snoring, apnea Gastrointestinal: No heartburn, dysphagia, abdominal pain, nausea /vomiting, rectal bleeding, melena, change in bowels Genitourinary: No dysuria, hematuria, pyuria, incontinence, nocturia Musculoskeletal: No joint stiffness, joint swelling, weakness, pain Dermatologic: No rash, pruritus, change in appearance of skin Neurologic: No dizziness, headache, syncope, seizures, numbness, tingling Psychiatric: No significant anxiety, depression, insomnia, anorexia Endocrine: No change in hair/skin/nails, excessive thirst, excessive hunger, excessive urination  Hematologic/lymphatic: No significant bruising, lymphadenopathy, abnormal bleeding Allergy/immunology: No itchy/watery eyes, significant sneezing, urticaria, angioedema  Physical  exam:  Pertinent or positive findings: General appearance: Adequately nourished; no acute distress, increased work of breathing is present.   Lymphatic: No lymphadenopathy about the head, neck, axilla. Eyes: No conjunctival inflammation or lid edema is present. There is no scleral icterus. Ears:  External ear exam shows no significant lesions or deformities.   Nose:  External nasal examination shows no deformity or inflammation. Nasal mucosa are pink and moist without lesions, exudates Oral exam: Lips and gums are healthy appearing.There is no oropharyngeal erythema or exudate. Neck:  No thyromegaly, masses, tenderness noted.    Heart:  Normal rate and regular rhythm. S1 and S2 normal without gallop, murmur, click, rub.  Lungs: Chest clear to auscultation without wheezes, rhonchi, rales, rubs. Abdomen: Bowel sounds are normal.  Abdomen is soft and nontender with no organomegaly, hernias, masses. GU: Deferred  Extremities:  No cyanosis, clubbing, edema. Neurologic exam:  Strength equal  in upper & lower extremities. Balance, Rhomberg, finger to nose testing could not be completed due to clinical state Deep tendon reflexes are equal Skin: Warm & dry w/o tenting. No significant lesions or rash.  See clinical summary under each active problem in the Problem List with associated updated therapeutic plan

## 2018-09-04 NOTE — Assessment & Plan Note (Signed)
Adjust beta-blocker dose depending on rate

## 2018-09-04 NOTE — Patient Instructions (Addendum)
See assessment and plan under each diagnosis in the problem list and acutely for this visit Note:Asthmatic bronchitis new issue;no PMH of reactive airways disease. CXray 3/2 revealed NAD (personally reviewed). Repeat film if findings persist or progress despite nebulizer treatments.

## 2018-09-04 NOTE — Assessment & Plan Note (Addendum)
Update TSH; it was therapeutic in June 2019 Avoid overcorrection because of the risk of PAF with rapid ventricular response

## 2018-09-04 NOTE — Assessment & Plan Note (Signed)
09/04/2018 slight tachyarrhythmia; rate is adequately controlled

## 2018-09-04 NOTE — Assessment & Plan Note (Signed)
Monitor renal function 

## 2018-09-04 NOTE — Assessment & Plan Note (Addendum)
Adjust beta-blocker dose based on heart rate and blood pressure Defer to Dr. Melburn Popper as to completion of event monitor to rule out intermittent complete heart block

## 2018-09-06 NOTE — Assessment & Plan Note (Signed)
No clinical sequellae documented 3/9

## 2018-09-10 ENCOUNTER — Other Ambulatory Visit: Payer: Self-pay | Admitting: Cardiovascular Disease

## 2018-09-14 ENCOUNTER — Encounter: Payer: Self-pay | Admitting: Internal Medicine

## 2018-09-14 ENCOUNTER — Non-Acute Institutional Stay (SKILLED_NURSING_FACILITY): Payer: Medicare Other | Admitting: Internal Medicine

## 2018-09-14 DIAGNOSIS — R82998 Other abnormal findings in urine: Secondary | ICD-10-CM

## 2018-09-14 DIAGNOSIS — L309 Dermatitis, unspecified: Secondary | ICD-10-CM | POA: Diagnosis not present

## 2018-09-14 NOTE — Progress Notes (Signed)
   NURSING HOME LOCATION:  [facility] ROOM NUMBER: 211/A   CODE STATUS:  DNR  PCP:  Pecola Lawless MD.  This is a nursing facility follow up for specific acute issues of leg rash and abnormal urine appearance.  Interim medical record and care since last [facility] visit was updated with review of diagnostic studies and change in clinical status since last visit were documented.  HPI: Her son stated that the patient was having dysuria and the urine was cloudy.  Staff reported there has been no fever, mental status changes or hypotension .She had been hospitalized in February for UTI. Today a rash of the right calf was reported by the staff.  Review of systems: She denies any pain or pruritus associated with the rash.  In fact she was unaware of the rash until it was brought to her attention.  She has any constitutional symptoms and also denies dysuria.  Does state that she has some phlegm production in the morning but this is scant.  She has no extrinsic symptoms.  Constitutional: No fever, significant weight change, fatigue  Eyes: No redness, discharge, pain, vision change ENT/mouth: No nasal congestion,  purulent discharge, earache, change in hearing, sore throat  Cardiovascular: No chest pain, palpitations, paroxysmal nocturnal dyspnea, claudication, edema  Respiratory: No hemoptysis, DOE, significant snoring, apnea   Gastrointestinal: No heartburn, dysphagia, abdominal pain, nausea /vomiting, rectal bleeding, melena, change in bowels Genitourinary: No dysuria, hematuria, pyuria, incontinence, nocturia Musculoskeletal: No joint stiffness, joint swelling, weakness, pain Dermatologic: No rash, pruritus, change in appearance of skin Neurologic: No dizziness, headache, syncope, seizures, numbness, tingling Psychiatric: No significant anxiety, depression, insomnia, anorexia Endocrine: No change in hair/skin/nails, excessive thirst, excessive hunger, excessive urination   Hematologic/lymphatic: No significant bruising, lymphadenopathy, abnormal bleeding Allergy/immunology: No itchy/watery eyes, significant sneezing, urticaria, angioedema  Physical exam:  Pertinent or positive findings: She exhibits a prominent stare.  She has a slight, subtle head tremor as well as some intermittent side to side movement of the mandible.  Her speech is slow and deliberate and she stammers somewhat at times.  An S4 is noted.  She has 1+ edema at the ankles.  Homan's sign is negative.  She has a bandage over the left knee.  There is an irregular violaceous rash over the left posterior medial calf which does not blanch.  There is no associated increased temperature, vesicles, purulence or cellulitis.  General appearance: Adequately nourished; no acute distress, increased work of breathing is present.   Lymphatic: No lymphadenopathy about the head, neck, axilla. Eyes: No conjunctival inflammation or lid edema is present. There is no scleral icterus. Ears:  External ear exam shows no significant lesions or deformities.   Nose:  External nasal examination shows no deformity or inflammation. Nasal mucosa are pink and moist without lesions, exudates Oral exam:  Lips and gums are healthy appearing. There is no oropharyngeal erythema or exudate. Neck:  No thyromegaly, masses, tenderness noted.    Heart:  Normal rate and regular rhythm without gallop, murmur, click, rub .  Lungs: Chest clear to auscultation without wheezes, rhonchi, rales, rubs. Abdomen: Bowel sounds are normal. Abdomen is soft and nontender with no organomegaly, hernias, masses. GU: Deferred  Extremities:  No cyanosis, clubbing  Skin: Warm & dry w/o tenting.  See summary under each acute problem with associated updated therapeutic plan

## 2018-09-14 NOTE — Patient Instructions (Signed)
See assessment and plan under each diagnosis acutely for this visit  

## 2018-09-16 ENCOUNTER — Encounter: Payer: Self-pay | Admitting: Internal Medicine

## 2018-09-19 ENCOUNTER — Non-Acute Institutional Stay (SKILLED_NURSING_FACILITY): Payer: Medicare Other | Admitting: Internal Medicine

## 2018-09-19 DIAGNOSIS — R300 Dysuria: Secondary | ICD-10-CM | POA: Diagnosis not present

## 2018-09-19 DIAGNOSIS — L959 Vasculitis limited to the skin, unspecified: Secondary | ICD-10-CM

## 2018-09-19 NOTE — Assessment & Plan Note (Signed)
Discontinue baclofen and monitor rash and monitor for signs of any systemic process.

## 2018-09-19 NOTE — Patient Instructions (Addendum)
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  Compression hose can be of benefit.  There is no sign of a systemic process here.  Because of the remote possibility this is drug-induced, baclofen will be discontinued.  Gabapentin will and Myrbetriq have less than 1% chance of causing this.  She agrees to discontinuation at least temporarily of the Baclofen. Total time 32 minutes; greater than 50% of the visit spent counseling patient about & its significance and coordinating care for problems addressed at this encounter

## 2018-09-19 NOTE — Progress Notes (Signed)
   NURSING HOME LOCATION:  [facility] ROOM NUMBER:  211  CODE STATUS:  DNR  PCP:  Merri Brunette MD  This is a nursing facility follow up for specific acute issue of progressive dermatitis.  Interim medical record and care since last [facility] visit was updated with review of diagnostic studies and change in clinical status since last visit were documented.  HPI: The patient developed a rash over the left lower extremity approximately 3/18.  This was evaluated 3/19 and it was completely asymptomatic.  In fact the patient was unaware of the rash.  It was noted to be bland and nonblanching and violaceous in appearance.  It has progressed and has begun to have some exfoliation. Staff has requested reassessment as to significance & communicability.  Review of systems: She denies any constitutional or extrinsic symptoms.  She does describe some initial dysuria which resolves with urination.  She is on Macrobid at this time pending the results of urine culture and sensitivity.  Constitutional: No fever, significant weight change, fatigue  Eyes: No redness, discharge, pain, vision change ENT/mouth: No nasal congestion,  purulent discharge, earache, change in hearing, sore throat  Cardiovascular: No chest pain, palpitations, paroxysmal nocturnal dyspnea, claudication  Respiratory: No cough, sputum production, hemoptysis, DOE, significant snoring, apnea   Gastrointestinal: No heartburn, dysphagia, abdominal pain, nausea /vomiting, rectal bleeding, melena, change in bowels Genitourinary: No hematuria, pyuria, incontinence, nocturia Musculoskeletal: No joint stiffness, joint swelling, weakness, pain Dermatologic: No  pruritus, pain, purulence Neurologic: No dizziness, headache, syncope, seizures, numbness, tingling Psychiatric: No significant anxiety, depression, insomnia, anorexia Endocrine: No change in hair/skin/nails, excessive thirst, excessive hunger, excessive urination   Hematologic/lymphatic: No significant bruising, lymphadenopathy, abnormal bleeding Allergy/immunology: No itchy/watery eyes, significant sneezing, urticaria, angioedema  Physical exam:  Pertinent or positive findings: She exhibits a stammering type voice pattern.  She has intermittent tremor of the head.  Stare is prominent.  Breath sounds are slightly decreased; she has minor rales.  Edema at the ankles.  This is slightly tender to palpation.  Left knee is bandaged.  She has a large irregular violaceous rash with minimal exfoliation over the left posterior medial calf.  Again there is no evidence of cellulitis, vesicles, or pustules.  It is nontender to compression and does not blanch.  General appearance: Adequately nourished; no acute distress, increased work of breathing is present.   Lymphatic: No lymphadenopathy about the head, neck, axilla. Eyes: No conjunctival inflammation or lid edema is present. There is no scleral icterus. Ears:  External ear exam shows no significant lesions or deformities.   Nose:  External nasal examination shows no deformity or inflammation. Nasal mucosa are pink and moist without lesions, exudates Oral exam:  Lips and gums are healthy appearing. There is no oropharyngeal erythema or exudate. Neck:  No thyromegaly, masses, tenderness noted.    Heart:  No gallop, murmur, click, rub .  Abdomen: Bowel sounds are normal. Abdomen is soft and nontender with no organomegaly, hernias, masses. GU: Deferred  Extremities:  No cyanosis, clubbing  Skin: Warm & dry w/o tenting.  See summary under each active problem in the Problem List with associated updated therapeutic plan

## 2018-09-20 ENCOUNTER — Encounter: Payer: Self-pay | Admitting: Internal Medicine

## 2018-10-03 ENCOUNTER — Other Ambulatory Visit: Payer: Self-pay | Admitting: Internal Medicine

## 2018-10-03 MED ORDER — PANTOPRAZOLE SODIUM 20 MG PO TBEC: 20.0000 mg | DELAYED_RELEASE_TABLET | Freq: Every day | ORAL | Status: DC

## 2018-10-05 ENCOUNTER — Non-Acute Institutional Stay (SKILLED_NURSING_FACILITY): Payer: Medicare Other | Admitting: Internal Medicine

## 2018-10-05 ENCOUNTER — Encounter: Payer: Self-pay | Admitting: Internal Medicine

## 2018-10-05 DIAGNOSIS — E039 Hypothyroidism, unspecified: Secondary | ICD-10-CM | POA: Diagnosis not present

## 2018-10-05 DIAGNOSIS — I480 Paroxysmal atrial fibrillation: Secondary | ICD-10-CM

## 2018-10-05 DIAGNOSIS — I2510 Atherosclerotic heart disease of native coronary artery without angina pectoris: Secondary | ICD-10-CM

## 2018-10-05 DIAGNOSIS — G2581 Restless legs syndrome: Secondary | ICD-10-CM

## 2018-10-05 DIAGNOSIS — J4520 Mild intermittent asthma, uncomplicated: Secondary | ICD-10-CM

## 2018-10-05 DIAGNOSIS — R55 Syncope and collapse: Secondary | ICD-10-CM | POA: Diagnosis not present

## 2018-10-05 NOTE — Progress Notes (Signed)
Location:   [facility] Room Number: 211A Place of Service:  SNF [phone]) Provider:  Edmon Crape, PA  Patient Care Team: Merri Brunette, MD as PCP - General (Internal Medicine) Nahser, Deloris Ping, MD as PCP - Cardiology (Cardiology)  Extended Emergency Contact Information Primary Emergency Contact: Blythe Stanford [place] Phone: [phone] Relation: Son Secondary Emergency Contact: Gregery Na [place] of [place] Mobile Phone: [phone] Relation: Daughter  Code Status:  DNR Goals of care: Advanced Directive information Advanced Directives 09/14/2018  Does Patient Have a Medical Advance Directive? Yes  Type of Advance Directive Out of facility DNR (pink MOST or yellow form)  Does patient want to make changes to medical advance directive? No - Patient declined  Copy of Healthcare Power of Attorney in Chart? No - copy requested  Would patient like information on creating a medical advance directive? -  Pre-existing out of facility DNR order (yellow form or pink MOST form) -     Chief Complaint  Patient presents with  . Medical Management of Chronic Issues    Routine visit  For medical management of chronic medical issues including history of syncope with bradycardia. History of a fall with rhabdomyoloysis Coronary artery disease as well as atrial fibrillation asthmatic bronchitis and left knee traumatichematoma     HPI:  Pt is a 85 y.o. female seen today for medical management of chronic diseases.  As noted above.  She was hospitalized in early March for being found the bathroom floor at her home.  She was noted to be bradycardic required an external pacemaker in route to the hospital.  Metoprolol Norvasc Imdur and diuretic were held  CK level was elevated as well as troponin was thought due to rhabdomyolysis   She had atrial fibrillation this was thought possibly secondary to diastolic dysfunction  Patient's mental  status deteriorated some and she was hypotensive and did agree to comfort measures.  Is felt syncope was related to bradycardia with hypotension and dehydration  Hypotension did resolve and metoprolol was started at low dose.  Patient also experienced left knee pain had a large abrasion there effusion this appears to have largely resolved  Her other medical issues include restless leg syndrome as well as hypothyroidism she is on Synthroid for possible seizures- history of Neri artery disease  Dyslipidemia and history of TIA   currently she is sitting in her wheelchair comfortably she is about to eat lunch and has no complaints Do note her blood pressures are somewhat variable ranging from 116-160 systolic  at times I do not see consistent elevations but this will have to be watched pulses vary from 60's--100     Past Medical History:  Diagnosis Date  . Arthritis    "mild; in all my joints" (04/27/2017)  . Coronary artery disease   . Cystitis with hematuria 04/26/2017  . Gait disorder   . H/O radioactive iodine thyroid ablation   . Hx of transient ischemic attack (TIA)   . Hyperlipidemia   . Hypertension    "not since MI in 2007" (04/27/2017)  . Hyperthyroidism    H/O radioactive iodine thyroid ablation [Z92.3]  . Hypothyroidism   . Lower extremity myoclonus   . Lumbosacral spondylosis   . Migraine    "I've had one in my lifetime" (04/27/2017)  . Mild obesity   . Myocardial infarction (HCC) 12/2005  . Myoclonus    Lower extremity involvement  . Obesity   . Pneumonia    "lots  when I was a small child; not at all since" (04/27/2017)  . Seizures (HCC)    History is questionable  . Thyroid disease    Past Surgical History:  Procedure Laterality Date  . ABDOMINAL HYSTERECTOMY  1972   for uterine fibroids/notes 11/10/2010  . ANTERIOR CERVICAL DECOMP/DISCECTOMY FUSION  03/2004   [name]/notes 11/10/2010  . APPENDECTOMY    . BACK SURGERY    . BREAST BIOPSY Right 1996   nodule  resected/notes 11/10/2010  . CARDIAC CATHETERIZATION  01/03/2006  . CATARACT EXTRACTION W/ INTRAOCULAR LENS  IMPLANT, BILATERAL Bilateral   . COLONOSCOPY  11/2003   [name]/notes 11/10/2010  . ESOPHAGOGASTRODUODENOSCOPY  09/2006   [name]/notes 11/10/2010  . LUMBAR DISC SURGERY  1990s X 1; 05/2003   "a nerve was pinching"; [name]/notes 11/10/2010  . OVARY SURGERY Left 1999   benign tumor resected /notes 11/10/2010  . TONSILLECTOMY  ~ 1947    Allergies  Allergen Reactions  . Codeine Nausea Only  . Meperidine Hcl Nausea Only  . Morphine Nausea Only  . Penicillins Nausea Only    Has patient had a PCN reaction causing immediate rash, facial/tongue/throat swelling, SOB or lightheadedness with hypotension: Unk Has patient had a PCN reaction causing severe rash involving mucus membranes or skin necrosis: Unk Has patient had a PCN reaction that required hospitalization: Unk Has patient had a PCN reaction occurring within the last 10 years: >50 years ago If all of the above answers are "NO", then may proceed with Cephalosporin use.     Allergies as of 10/05/2018      Reactions   Codeine Nausea Only   Meperidine Hcl Nausea Only   Morphine Nausea Only   Penicillins Nausea Only   Has patient had a PCN reaction causing immediate rash, facial/tongue/throat swelling, SOB or lightheadedness with hypotension: Unk Has patient had a PCN reaction causing severe rash involving mucus membranes or skin necrosis: Unk Has patient had a PCN reaction that required hospitalization: Unk Has patient had a PCN reaction occurring within the last 10 years: >50 years ago If all of the above answers are "NO", then may proceed with Cephalosporin use.      Medication List       Accurate as of October 05, 2018 10:36 AM. Always use your most recent med list.        aspirin EC 81 MG tablet Take 81 mg by mouth at bedtime.   Calcium 500+D High Potency 500-400 MG-UNIT tablet Generic drug:  calcium-vitamin D Take 1 tablet by mouth  daily.   clopidogrel 75 MG tablet Commonly known as:  PLAVIX TAKE 1 TABLET BY MOUTH  DAILY   Cyanocobalamin 2500 MCG Tabs Take 2,500 mcg by mouth daily. Vitamin B12   gabapentin 400 MG capsule Commonly known as:  NEURONTIN TAKE 1 CAPSULE (400 MG TOTAL) BY MOUTH 4 (FOUR) TIMES DAILY.   ipratropium-albuterol 0.5-2.5 (3) MG/3ML Soln Commonly known as:  DUONEB Take 3 mLs by nebulization. INHALE 1 VIAL VIA NEBULIZER EVERY 6 HOURS AS NEEDED FOR WHEEZING   levothyroxine 100 MCG tablet Commonly known as:  SYNTHROID, LEVOTHROID Take 100 mcg by mouth daily before breakfast.   magnesium hydroxide 400 MG/5ML suspension Commonly known as:  MILK OF MAGNESIA Constipation (1 of 4): If no BM in 3 days, give 30 cc Milk of Magnesium p.o. x 1 dose in 24 hours as needed (Do not use standing constipation orders for residents with renal failure CFR less than 30. Contact MD for orders)   metoprolol  tartrate 25 MG tablet Commonly known as:  LOPRESSOR Take 12.5 mg by mouth 2 (two) times daily.   multivitamin with minerals Tabs tablet Take 1 tablet by mouth daily.   Nitrostat 0.4 MG SL tablet Generic drug:  nitroGLYCERIN DISSOLVE 1 TAB UNDER THE  TONGUE EVERY 5 MINUTES AS  NEEDED FOR CHEST PAIN. MAX  3 TOTAL DOSES/15MIN. CALL  911 IF NO RELIEF   oxybutynin 15 MG 24 hr tablet Commonly known as:  DITROPAN XL Take 15 mg by mouth. TAKE 1 TABLET BY MOUTH ONCE DAILY FOR OVERACTIVE BLADDER (DO NOT CRUSH)   pantoprazole 20 MG tablet Commonly known as:  PROTONIX Take 20 mg by mouth. TAKE 1 TABLET BY MOUTH ONCE DAILY FOR REFLUX (DO NOT CRUSH)   simvastatin 20 MG tablet Commonly known as:  ZOCOR Take 20 mg by mouth at bedtime.   Tylenol Arthritis Pain 650 MG CR tablet Generic drug:  acetaminophen Take 1,300 mg by mouth every 8 (eight) hours.       Review of Systems    In general she is not complaining of any fever chills  Skin does not complain of rashes or itching she did have a rash of her  left calf but this appears to have resolved  Head ears eyes nose mouth and throat she has prescription lenses she does not complain of visual changes or sore throat  Respiratory does not complain of shortness of breath or cough  Cardiac denies chest pain has mild lower extremity edema  GI does not complain of abdominal pain nausea vomiting diarrhea constipation says her appetite is pretty good when she likes the food  GU is not complaining of dysuria she did have a urine culture done last month which grew out  only 35,000 colonies of Proteus- she is not complaining of dysuria  Musculoskeletal apparently has gained strength she does not complain of joint pain or left leg pain at this time  Neurologic does not complain of dizziness headache or syncope  Psych continues to be in good spirits does not complain of being depressed or anxious appears to be active and engaged   There is no immunization history on file for this patient. Pertinent  Health Maintenance Due  Topic Date Due  . DEXA SCAN  10/05/2018 (Originally 12/03/1990)  . PNA vac Low Risk Adult (1 of 2 - PCV13) 10/05/2018 (Originally 12/03/1990)  . INFLUENZA VACCINE  01/27/2019   Fall Risk  11/02/2017  Falls in the past year? Yes  Number falls in past yr: 1  Injury with Fall? Yes  Comment hospital x 3 days  Risk for fall due to : Other (Comment)  Risk for fall due to: Comment sick with UTI   Functional Status Survey:    Vitals:   10/05/18 0845  BP: (!) 156/77  Pulse: 100  Resp: 18  Temp: 98 F (36.7 C)  Weight: 143 lb 6.4 oz (65 kg)  Height:  (1.499 m)  As noted above systolic blood pressures ranged from the lower 100s up to around 160 quite  a bit of variability Body mass index is 28.96 kg/m. Physical Exam In general   this is a very pleasant female who looks younger than her stated age  Skin is warm and dry she does have a well-healed abrasion on her left knee  Eyes she has prescription lenses visual  acuity appears to be intact sclera and conjunctive are clear  Oropharynx is clear mucous membranes moist  Chest is clear  to auscultation with shallow air entry  Heart distant heart sounds regular rate and rhythm she has mild lower extremity edema bilaterally  Abdomen is somewhat obese soft nontender with positive bowel sounds   Musculoskeletal Limited exam since she is in wheelchair but is able to move all extremities x4.  Left knee is still slightly edematous and again only a residual abrasion appears visible There is no increased erythema or tenderness to palpation compared to the right knee  Neurologic is grossly intact her speech is clear   psych she continues to be very pleasant and appropriate--is conversational Labs reviewed: Recent Labs    08/03/18 0927  08/30/18 0853 08/31/18 0627 09/01/18 0432  NA 138   < > 137 138 137  K 3.5   < > 3.1* 3.4* 3.9  CL 102   < > 97* 98 102  CO2 27   < > GLUCOSE 111*   < > 162* 131* 119*  BUN 22   < > CREATININE 0.72   < > 0.88 0.83 0.70  CALCIUM 9.3   < > 8.8* 8.7* 8.5*  MG 2.0  --  1.6* 2.2  --    < > = values in this interval not displayed.   Recent Labs    12/12/17 2250 08/01/18 1438 08/28/18 1442  AST 28 34 55*  ALT ALKPHOS 37* 35* 42  BILITOT 0.9 0.8 0.7  PROT 6.4* 5.8* 6.8  ALBUMIN 3.8 3.3* 3.8   Recent Labs    12/13/17 0217 12/14/17 0615 08/01/18 1639 08/28/18 1442  WBC 8.4 8.2 6.7 10.0  NEUTROABS 6.4  --  4.6 7.5  HGB 15.3* 14.7 15.3* 14.6  HCT 47.0* 44.4 46.7* 45.8  MCV 89.0 87.2 89.6 88.6  PLT 160 149* 166 183   Lab Results  Component Value Date   TSH 2.114 12/13/2017   No results found for: HGBA1C No results found for: CHOL, HDL, LDLCALC, LDLDIRECT, TRIG, CHOLHDL  Significant Diagnostic Results in last 30 days:  No results found.  Assessment/Plan  #1 history of syncope with bradycardia-there is consideration for possible event monitor with cardiology--Dr  Nasher--however this is been complicated somewhat with the coronavirus concerns Please she appears to be stable appears to be tolerating Lopressor well pulses very in the 60s 100-blood pressures are variable but not unstable at this point continue to monitor clinically she appears to be doing quite well.  2.  History of coronary artery disease this is been largely asymptomatic during her stay here she is on aspirin Plavix and a statin she also has nitro as needed but I do not believe this ihas been used  She does not complain of any chest pain or shortness of breath  3.  History of asthmatic bronchitis this is been stable as well she does have duo nebs if needed.  4 history of hypothyroidism she is on Synthroid- TSH was mildly elevated at 8.6- per review by Dr. Alwyn Ren there is emphasis on conservative dosage changes secondary to concerns with overcorrection with her history of A. fib  At this point appears stable.  4.  History of A. fib again she continues on beta-blocker and aspirin appears to be well-tolerated.  5.  History of rhabdomylolysis-this appears stable her renal function is stable on lab done last month with a creatinine of 0.7. Have been conservative adding updated labs because of coronavirus concerns clinically she appears stable.  6.  History of  low magnesium and low potassium in hospital this was supplemented and normalized clinically appears stable-we have been conservative and ordering labs secondary to coronavirus concerns and limiting visitors to facility   restless legs she continues on Neurontin andMybetriq --she is not complaining of symptoms today  Clinically she appears to be doing well at this point continue to monitor.  [id number]

## 2018-10-17 ENCOUNTER — Encounter: Payer: Self-pay | Admitting: Internal Medicine

## 2018-10-17 ENCOUNTER — Non-Acute Institutional Stay (SKILLED_NURSING_FACILITY): Payer: Medicare Other | Admitting: Internal Medicine

## 2018-10-17 DIAGNOSIS — E039 Hypothyroidism, unspecified: Secondary | ICD-10-CM

## 2018-10-17 DIAGNOSIS — I119 Hypertensive heart disease without heart failure: Secondary | ICD-10-CM | POA: Diagnosis not present

## 2018-10-17 DIAGNOSIS — N390 Urinary tract infection, site not specified: Secondary | ICD-10-CM

## 2018-10-17 DIAGNOSIS — M150 Primary generalized (osteo)arthritis: Secondary | ICD-10-CM | POA: Diagnosis not present

## 2018-10-17 DIAGNOSIS — I2510 Atherosclerotic heart disease of native coronary artery without angina pectoris: Secondary | ICD-10-CM | POA: Diagnosis not present

## 2018-10-17 DIAGNOSIS — R319 Hematuria, unspecified: Secondary | ICD-10-CM

## 2018-10-17 DIAGNOSIS — R55 Syncope and collapse: Secondary | ICD-10-CM

## 2018-10-17 NOTE — Assessment & Plan Note (Addendum)
At SNF urine was noted to be cloudy and urinalysis revealed 30-50 white cells without significant hematuria.  Culture revealed only 35,000 colonies of Proteus.  Empiric Macrobid which had been initiated was discontinued.

## 2018-10-17 NOTE — Patient Instructions (Signed)
See assessment and plan under each diagnosis in the problem list and acutely for this visit 

## 2018-10-17 NOTE — Assessment & Plan Note (Addendum)
Tentatively Cardiology follow-up is scheduled 11/22/2018.  [facility] follow-up  5/12 . There has been no recurrence of the profound bradycardia or syncope at the SNF.

## 2018-10-17 NOTE — Assessment & Plan Note (Signed)
09/05/2018 TSH 8.60; L-thyroxine increased to 100 mcg daily.

## 2018-10-17 NOTE — Progress Notes (Signed)
NURSING HOME LOCATION:  [facility] ROOM NUMBER: [id number]   CODE STATUS:  DNR  PCP:  Merri BrunetteWalter Pharr MD.  This is a nursing facility follow up of chronic medical diagnoses as a prelude to discharge & transfer to another assisted living facility 4/28.  Interim medical record and care since last [facility] visit was updated with review of diagnostic studies and change in clinical status since last visit were documented.  HPI: The patient has been a resident of the facility since 3/6 following hospitalization 3/2-3/6 for possible syncopal episode with associated intermittent profound severe bradycardia and hypotension.  Her syncopal episode was complicated by rhabdomyolysis and was associated with some elevation of troponin.  Today she tells me the episode occurred when she was attempting to stand from the commode and support herself with her walker.  She stated she became "swimmy headed" and fell.  She stated that this has been an intermittent phenomena over the last couple of years.  She denies any cardiac or neurologic prodrome prior to that episode or any of the others. She did have PAF felt to be related to diastolic dysfunction.  Antihypertensives were held because of the hypotension; subsequently low-dose metoprolol was reinitiated. The syncope was also complicated by knee hemarthrosis which necessitated joint aspiration of 15 cc of blood on 3/5.   Other diagnoses include hypothyroidism, possible seizure history, MI, migraine headaches, dyslipidemia, and history of TIA. Here at the SNF she was seen acutely 3/19 for rash over the left calf.  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  Baclofen was discontinued.  The rash has resolved.  She states that it was "bright red" but never symptomatic otherwise.   Also there was concern on her son's part for possible UTI based on urine appearance.Urinalysis did show 30-50 white cells, but culture revealed only 35,000 colonies of Proteus.  She had  been placed on Macrobid twice a day until the final culture results returned and this was discontinued. On 3/10 her TSH was 8.6 so and L-thyroxine was increased to 100 mcg daily. [facility]ative [facility] to [facility] 4/28.  The patient has been on quarantine along with all other residents here at the facility.  She has exhibited no signs or symptoms of coronavirus & testing has not been performed.  She realizes that she will need to be in quarantine for 14 days at the new facility.  Review of systems: She is an excellent historian and fully oriented.  She states her only symptom is dry mouth, mainly at night.  She has no associated xero- ophthalmia.  Also denies dysphagia.  Constitutional: No fever, significant weight change, fatigue  Eyes: No redness, discharge, pain, vision change ENT/mouth: No nasal congestion,  purulent discharge, earache, change in hearing, sore throat  Cardiovascular: No chest pain, palpitations, paroxysmal nocturnal dyspnea, claudication, edema  Respiratory: No cough, sputum production, hemoptysis, DOE, significant snoring, apnea   Gastrointestinal: No heartburn,  abdominal pain, nausea /vomiting, rectal bleeding, melena, change in bowels Genitourinary: No dysuria, hematuria, pyuria, incontinence, nocturia Musculoskeletal: No joint stiffness, joint swelling, weakness, pain Dermatologic: No rash, pruritus, change in appearance of skin Neurologic: No dizziness, headache, syncope, seizures, numbness, tingling Psychiatric: No significant anxiety, depression, insomnia, anorexia Endocrine: No change in hair/skin/nails, excessive thirst, excessive hunger, excessive urination  Hematologic/lymphatic: No significant bruising, lymphadenopathy, abnormal bleeding Allergy/immunology: No itchy/watery eyes, significant sneezing, urticaria, angioedema  Physical exam:  Pertinent or positive findings: She exhibits somewhat of a masked facies.  Intermittently the  left  angle of the mouth does sag but her smile is symmetric.  Speech pattern is deliberate and slightly halting.  Tongue is moist.  One half + edema is present.  Pedal pulses are palpable.  She has scattered somewhat circular hyperpigmented lesions over the forearms, greater on the right.  She also has a irregular large bland faint hyperpigmentation of the left gastrocnemius. (see PE 09/19/2018; rash was violaceous with minimal exfoliation)  This does not blanch to pressure.  Strength is good to opposition.  General appearance: Adequately nourished; no acute distress, increased work of breathing is present.   Lymphatic: No lymphadenopathy about the head, neck, axilla. Eyes: No conjunctival inflammation or lid edema is present. There is no scleral icterus. Ears:  External ear exam shows no significant lesions or deformities.   Nose:  External nasal examination shows no deformity or inflammation. Nasal mucosa are pink and moist without lesions, exudates Oral exam:  Lips and gums are healthy appearing. There is no oropharyngeal erythema or exudate. Neck:  No thyromegaly, masses, tenderness noted.    Heart:  Normal rate and regular rhythm. S1 and S2 normal without gallop, murmur, click, rub .  Lungs: Chest clear to auscultation without wheezes, rhonchi, rales, rubs. Abdomen: Bowel sounds are normal. Abdomen is soft and nontender with no organomegaly, hernias, masses. GU: Deferred  Extremities:  No cyanosis, clubbing  Neurologic exam : Cn 2-7 intact Strength equal  in upper & lower extremities Balance, Rhomberg, finger to nose testing could not be completed due to clinical state Deep tendon reflexes are equal Skin: Warm & dry w/o tenting. No significant lesions or rash beyond hyperpigmentation changes as noted.  See summary under each active problem in the Problem List with associated updated therapeutic plan

## 2018-10-22 DIAGNOSIS — I480 Paroxysmal atrial fibrillation: Secondary | ICD-10-CM | POA: Diagnosis not present

## 2018-10-22 DIAGNOSIS — M1990 Unspecified osteoarthritis, unspecified site: Secondary | ICD-10-CM | POA: Diagnosis not present

## 2018-10-22 DIAGNOSIS — M6282 Rhabdomyolysis: Secondary | ICD-10-CM | POA: Diagnosis not present

## 2018-10-24 DIAGNOSIS — S8002XD Contusion of left knee, subsequent encounter: Secondary | ICD-10-CM | POA: Diagnosis not present

## 2018-10-24 DIAGNOSIS — Z7982 Long term (current) use of aspirin: Secondary | ICD-10-CM | POA: Diagnosis not present

## 2018-10-24 DIAGNOSIS — M6282 Rhabdomyolysis: Secondary | ICD-10-CM | POA: Diagnosis not present

## 2018-10-24 DIAGNOSIS — G2581 Restless legs syndrome: Secondary | ICD-10-CM | POA: Diagnosis not present

## 2018-10-24 DIAGNOSIS — I10 Essential (primary) hypertension: Secondary | ICD-10-CM | POA: Diagnosis not present

## 2018-10-24 DIAGNOSIS — I2510 Atherosclerotic heart disease of native coronary artery without angina pectoris: Secondary | ICD-10-CM | POA: Diagnosis not present

## 2018-10-24 DIAGNOSIS — W19XXXD Unspecified fall, subsequent encounter: Secondary | ICD-10-CM | POA: Diagnosis not present

## 2018-10-24 DIAGNOSIS — M1990 Unspecified osteoarthritis, unspecified site: Secondary | ICD-10-CM | POA: Diagnosis not present

## 2018-10-24 DIAGNOSIS — L959 Vasculitis limited to the skin, unspecified: Secondary | ICD-10-CM | POA: Diagnosis not present

## 2018-10-24 DIAGNOSIS — Z7902 Long term (current) use of antithrombotics/antiplatelets: Secondary | ICD-10-CM | POA: Diagnosis not present

## 2018-10-24 DIAGNOSIS — I480 Paroxysmal atrial fibrillation: Secondary | ICD-10-CM | POA: Diagnosis not present

## 2018-10-24 DIAGNOSIS — J45909 Unspecified asthma, uncomplicated: Secondary | ICD-10-CM | POA: Diagnosis not present

## 2018-10-24 DIAGNOSIS — Z8673 Personal history of transient ischemic attack (TIA), and cerebral infarction without residual deficits: Secondary | ICD-10-CM | POA: Diagnosis not present

## 2018-10-25 DIAGNOSIS — M1990 Unspecified osteoarthritis, unspecified site: Secondary | ICD-10-CM | POA: Diagnosis not present

## 2018-10-25 DIAGNOSIS — I480 Paroxysmal atrial fibrillation: Secondary | ICD-10-CM | POA: Diagnosis not present

## 2018-10-25 DIAGNOSIS — M6282 Rhabdomyolysis: Secondary | ICD-10-CM | POA: Diagnosis not present

## 2018-10-26 DIAGNOSIS — I2510 Atherosclerotic heart disease of native coronary artery without angina pectoris: Secondary | ICD-10-CM | POA: Diagnosis not present

## 2018-10-26 DIAGNOSIS — I10 Essential (primary) hypertension: Secondary | ICD-10-CM | POA: Diagnosis not present

## 2018-10-26 DIAGNOSIS — Z8673 Personal history of transient ischemic attack (TIA), and cerebral infarction without residual deficits: Secondary | ICD-10-CM | POA: Diagnosis not present

## 2018-10-26 DIAGNOSIS — K219 Gastro-esophageal reflux disease without esophagitis: Secondary | ICD-10-CM | POA: Diagnosis not present

## 2018-10-26 DIAGNOSIS — E039 Hypothyroidism, unspecified: Secondary | ICD-10-CM | POA: Diagnosis not present

## 2018-10-26 DIAGNOSIS — S8002XD Contusion of left knee, subsequent encounter: Secondary | ICD-10-CM | POA: Diagnosis not present

## 2018-10-26 DIAGNOSIS — Z7902 Long term (current) use of antithrombotics/antiplatelets: Secondary | ICD-10-CM | POA: Diagnosis not present

## 2018-10-26 DIAGNOSIS — W19XXXD Unspecified fall, subsequent encounter: Secondary | ICD-10-CM | POA: Diagnosis not present

## 2018-10-26 DIAGNOSIS — M1990 Unspecified osteoarthritis, unspecified site: Secondary | ICD-10-CM | POA: Diagnosis not present

## 2018-10-26 DIAGNOSIS — R001 Bradycardia, unspecified: Secondary | ICD-10-CM | POA: Diagnosis not present

## 2018-10-26 DIAGNOSIS — J45909 Unspecified asthma, uncomplicated: Secondary | ICD-10-CM | POA: Diagnosis not present

## 2018-10-26 DIAGNOSIS — Z7982 Long term (current) use of aspirin: Secondary | ICD-10-CM | POA: Diagnosis not present

## 2018-10-26 DIAGNOSIS — M6282 Rhabdomyolysis: Secondary | ICD-10-CM | POA: Diagnosis not present

## 2018-10-26 DIAGNOSIS — I480 Paroxysmal atrial fibrillation: Secondary | ICD-10-CM | POA: Diagnosis not present

## 2018-10-26 DIAGNOSIS — L959 Vasculitis limited to the skin, unspecified: Secondary | ICD-10-CM | POA: Diagnosis not present

## 2018-10-26 DIAGNOSIS — G2581 Restless legs syndrome: Secondary | ICD-10-CM | POA: Diagnosis not present

## 2018-10-26 DIAGNOSIS — Z79899 Other long term (current) drug therapy: Secondary | ICD-10-CM | POA: Diagnosis not present

## 2018-10-26 DIAGNOSIS — R531 Weakness: Secondary | ICD-10-CM | POA: Diagnosis not present

## 2018-10-30 DIAGNOSIS — Z7982 Long term (current) use of aspirin: Secondary | ICD-10-CM | POA: Diagnosis not present

## 2018-10-30 DIAGNOSIS — I480 Paroxysmal atrial fibrillation: Secondary | ICD-10-CM | POA: Diagnosis not present

## 2018-10-30 DIAGNOSIS — Z8673 Personal history of transient ischemic attack (TIA), and cerebral infarction without residual deficits: Secondary | ICD-10-CM | POA: Diagnosis not present

## 2018-10-30 DIAGNOSIS — J45909 Unspecified asthma, uncomplicated: Secondary | ICD-10-CM | POA: Diagnosis not present

## 2018-10-30 DIAGNOSIS — S8002XD Contusion of left knee, subsequent encounter: Secondary | ICD-10-CM | POA: Diagnosis not present

## 2018-10-30 DIAGNOSIS — W19XXXD Unspecified fall, subsequent encounter: Secondary | ICD-10-CM | POA: Diagnosis not present

## 2018-10-30 DIAGNOSIS — M1990 Unspecified osteoarthritis, unspecified site: Secondary | ICD-10-CM | POA: Diagnosis not present

## 2018-10-30 DIAGNOSIS — M6282 Rhabdomyolysis: Secondary | ICD-10-CM | POA: Diagnosis not present

## 2018-10-30 DIAGNOSIS — Z7902 Long term (current) use of antithrombotics/antiplatelets: Secondary | ICD-10-CM | POA: Diagnosis not present

## 2018-10-30 DIAGNOSIS — I10 Essential (primary) hypertension: Secondary | ICD-10-CM | POA: Diagnosis not present

## 2018-10-30 DIAGNOSIS — L959 Vasculitis limited to the skin, unspecified: Secondary | ICD-10-CM | POA: Diagnosis not present

## 2018-10-30 DIAGNOSIS — G2581 Restless legs syndrome: Secondary | ICD-10-CM | POA: Diagnosis not present

## 2018-10-30 DIAGNOSIS — I2510 Atherosclerotic heart disease of native coronary artery without angina pectoris: Secondary | ICD-10-CM | POA: Diagnosis not present

## 2018-10-31 DIAGNOSIS — M1990 Unspecified osteoarthritis, unspecified site: Secondary | ICD-10-CM | POA: Diagnosis not present

## 2018-10-31 DIAGNOSIS — G2581 Restless legs syndrome: Secondary | ICD-10-CM | POA: Diagnosis not present

## 2018-10-31 DIAGNOSIS — I480 Paroxysmal atrial fibrillation: Secondary | ICD-10-CM | POA: Diagnosis not present

## 2018-10-31 DIAGNOSIS — J45909 Unspecified asthma, uncomplicated: Secondary | ICD-10-CM | POA: Diagnosis not present

## 2018-10-31 DIAGNOSIS — M6282 Rhabdomyolysis: Secondary | ICD-10-CM | POA: Diagnosis not present

## 2018-10-31 DIAGNOSIS — Z7902 Long term (current) use of antithrombotics/antiplatelets: Secondary | ICD-10-CM | POA: Diagnosis not present

## 2018-10-31 DIAGNOSIS — S8002XD Contusion of left knee, subsequent encounter: Secondary | ICD-10-CM | POA: Diagnosis not present

## 2018-10-31 DIAGNOSIS — Z7982 Long term (current) use of aspirin: Secondary | ICD-10-CM | POA: Diagnosis not present

## 2018-10-31 DIAGNOSIS — W19XXXD Unspecified fall, subsequent encounter: Secondary | ICD-10-CM | POA: Diagnosis not present

## 2018-10-31 DIAGNOSIS — I10 Essential (primary) hypertension: Secondary | ICD-10-CM | POA: Diagnosis not present

## 2018-10-31 DIAGNOSIS — I2510 Atherosclerotic heart disease of native coronary artery without angina pectoris: Secondary | ICD-10-CM | POA: Diagnosis not present

## 2018-10-31 DIAGNOSIS — Z8673 Personal history of transient ischemic attack (TIA), and cerebral infarction without residual deficits: Secondary | ICD-10-CM | POA: Diagnosis not present

## 2018-10-31 DIAGNOSIS — L959 Vasculitis limited to the skin, unspecified: Secondary | ICD-10-CM | POA: Diagnosis not present

## 2018-11-02 DIAGNOSIS — R001 Bradycardia, unspecified: Secondary | ICD-10-CM | POA: Diagnosis not present

## 2018-11-02 DIAGNOSIS — R531 Weakness: Secondary | ICD-10-CM | POA: Diagnosis not present

## 2018-11-02 DIAGNOSIS — I10 Essential (primary) hypertension: Secondary | ICD-10-CM | POA: Diagnosis not present

## 2018-11-02 DIAGNOSIS — E039 Hypothyroidism, unspecified: Secondary | ICD-10-CM | POA: Diagnosis not present

## 2018-11-02 DIAGNOSIS — K219 Gastro-esophageal reflux disease without esophagitis: Secondary | ICD-10-CM | POA: Diagnosis not present

## 2018-11-03 DIAGNOSIS — M6282 Rhabdomyolysis: Secondary | ICD-10-CM | POA: Diagnosis not present

## 2018-11-03 DIAGNOSIS — L959 Vasculitis limited to the skin, unspecified: Secondary | ICD-10-CM | POA: Diagnosis not present

## 2018-11-03 DIAGNOSIS — S8002XD Contusion of left knee, subsequent encounter: Secondary | ICD-10-CM | POA: Diagnosis not present

## 2018-11-03 DIAGNOSIS — M1990 Unspecified osteoarthritis, unspecified site: Secondary | ICD-10-CM | POA: Diagnosis not present

## 2018-11-03 DIAGNOSIS — Z7982 Long term (current) use of aspirin: Secondary | ICD-10-CM | POA: Diagnosis not present

## 2018-11-03 DIAGNOSIS — G2581 Restless legs syndrome: Secondary | ICD-10-CM | POA: Diagnosis not present

## 2018-11-03 DIAGNOSIS — Z7902 Long term (current) use of antithrombotics/antiplatelets: Secondary | ICD-10-CM | POA: Diagnosis not present

## 2018-11-03 DIAGNOSIS — Z8673 Personal history of transient ischemic attack (TIA), and cerebral infarction without residual deficits: Secondary | ICD-10-CM | POA: Diagnosis not present

## 2018-11-03 DIAGNOSIS — I2510 Atherosclerotic heart disease of native coronary artery without angina pectoris: Secondary | ICD-10-CM | POA: Diagnosis not present

## 2018-11-03 DIAGNOSIS — J45909 Unspecified asthma, uncomplicated: Secondary | ICD-10-CM | POA: Diagnosis not present

## 2018-11-03 DIAGNOSIS — W19XXXD Unspecified fall, subsequent encounter: Secondary | ICD-10-CM | POA: Diagnosis not present

## 2018-11-03 DIAGNOSIS — I480 Paroxysmal atrial fibrillation: Secondary | ICD-10-CM | POA: Diagnosis not present

## 2018-11-03 DIAGNOSIS — I10 Essential (primary) hypertension: Secondary | ICD-10-CM | POA: Diagnosis not present

## 2018-11-06 DIAGNOSIS — W19XXXD Unspecified fall, subsequent encounter: Secondary | ICD-10-CM | POA: Diagnosis not present

## 2018-11-06 DIAGNOSIS — I480 Paroxysmal atrial fibrillation: Secondary | ICD-10-CM | POA: Diagnosis not present

## 2018-11-06 DIAGNOSIS — I10 Essential (primary) hypertension: Secondary | ICD-10-CM | POA: Diagnosis not present

## 2018-11-06 DIAGNOSIS — M6282 Rhabdomyolysis: Secondary | ICD-10-CM | POA: Diagnosis not present

## 2018-11-06 DIAGNOSIS — M1990 Unspecified osteoarthritis, unspecified site: Secondary | ICD-10-CM | POA: Diagnosis not present

## 2018-11-06 DIAGNOSIS — S8002XD Contusion of left knee, subsequent encounter: Secondary | ICD-10-CM | POA: Diagnosis not present

## 2018-11-06 DIAGNOSIS — J45909 Unspecified asthma, uncomplicated: Secondary | ICD-10-CM | POA: Diagnosis not present

## 2018-11-06 DIAGNOSIS — I2510 Atherosclerotic heart disease of native coronary artery without angina pectoris: Secondary | ICD-10-CM | POA: Diagnosis not present

## 2018-11-06 DIAGNOSIS — G2581 Restless legs syndrome: Secondary | ICD-10-CM | POA: Diagnosis not present

## 2018-11-06 DIAGNOSIS — Z7902 Long term (current) use of antithrombotics/antiplatelets: Secondary | ICD-10-CM | POA: Diagnosis not present

## 2018-11-06 DIAGNOSIS — Z8673 Personal history of transient ischemic attack (TIA), and cerebral infarction without residual deficits: Secondary | ICD-10-CM | POA: Diagnosis not present

## 2018-11-06 DIAGNOSIS — L959 Vasculitis limited to the skin, unspecified: Secondary | ICD-10-CM | POA: Diagnosis not present

## 2018-11-06 DIAGNOSIS — Z7982 Long term (current) use of aspirin: Secondary | ICD-10-CM | POA: Diagnosis not present

## 2018-11-07 ENCOUNTER — Ambulatory Visit: Payer: Medicare Other | Admitting: Adult Health

## 2018-11-07 DIAGNOSIS — G2581 Restless legs syndrome: Secondary | ICD-10-CM | POA: Diagnosis not present

## 2018-11-07 DIAGNOSIS — Z7902 Long term (current) use of antithrombotics/antiplatelets: Secondary | ICD-10-CM | POA: Diagnosis not present

## 2018-11-07 DIAGNOSIS — I2510 Atherosclerotic heart disease of native coronary artery without angina pectoris: Secondary | ICD-10-CM | POA: Diagnosis not present

## 2018-11-07 DIAGNOSIS — I480 Paroxysmal atrial fibrillation: Secondary | ICD-10-CM | POA: Diagnosis not present

## 2018-11-07 DIAGNOSIS — S8002XD Contusion of left knee, subsequent encounter: Secondary | ICD-10-CM | POA: Diagnosis not present

## 2018-11-07 DIAGNOSIS — I10 Essential (primary) hypertension: Secondary | ICD-10-CM | POA: Diagnosis not present

## 2018-11-07 DIAGNOSIS — W19XXXD Unspecified fall, subsequent encounter: Secondary | ICD-10-CM | POA: Diagnosis not present

## 2018-11-07 DIAGNOSIS — Z7982 Long term (current) use of aspirin: Secondary | ICD-10-CM | POA: Diagnosis not present

## 2018-11-07 DIAGNOSIS — M6282 Rhabdomyolysis: Secondary | ICD-10-CM | POA: Diagnosis not present

## 2018-11-07 DIAGNOSIS — M1990 Unspecified osteoarthritis, unspecified site: Secondary | ICD-10-CM | POA: Diagnosis not present

## 2018-11-07 DIAGNOSIS — L959 Vasculitis limited to the skin, unspecified: Secondary | ICD-10-CM | POA: Diagnosis not present

## 2018-11-07 DIAGNOSIS — Z8673 Personal history of transient ischemic attack (TIA), and cerebral infarction without residual deficits: Secondary | ICD-10-CM | POA: Diagnosis not present

## 2018-11-07 DIAGNOSIS — J45909 Unspecified asthma, uncomplicated: Secondary | ICD-10-CM | POA: Diagnosis not present

## 2018-11-09 DIAGNOSIS — Z7902 Long term (current) use of antithrombotics/antiplatelets: Secondary | ICD-10-CM | POA: Diagnosis not present

## 2018-11-09 DIAGNOSIS — I10 Essential (primary) hypertension: Secondary | ICD-10-CM | POA: Diagnosis not present

## 2018-11-09 DIAGNOSIS — Z7982 Long term (current) use of aspirin: Secondary | ICD-10-CM | POA: Diagnosis not present

## 2018-11-09 DIAGNOSIS — S8002XD Contusion of left knee, subsequent encounter: Secondary | ICD-10-CM | POA: Diagnosis not present

## 2018-11-09 DIAGNOSIS — M1990 Unspecified osteoarthritis, unspecified site: Secondary | ICD-10-CM | POA: Diagnosis not present

## 2018-11-09 DIAGNOSIS — J45909 Unspecified asthma, uncomplicated: Secondary | ICD-10-CM | POA: Diagnosis not present

## 2018-11-09 DIAGNOSIS — Z8673 Personal history of transient ischemic attack (TIA), and cerebral infarction without residual deficits: Secondary | ICD-10-CM | POA: Diagnosis not present

## 2018-11-09 DIAGNOSIS — M6282 Rhabdomyolysis: Secondary | ICD-10-CM | POA: Diagnosis not present

## 2018-11-09 DIAGNOSIS — L959 Vasculitis limited to the skin, unspecified: Secondary | ICD-10-CM | POA: Diagnosis not present

## 2018-11-09 DIAGNOSIS — G2581 Restless legs syndrome: Secondary | ICD-10-CM | POA: Diagnosis not present

## 2018-11-09 DIAGNOSIS — I480 Paroxysmal atrial fibrillation: Secondary | ICD-10-CM | POA: Diagnosis not present

## 2018-11-09 DIAGNOSIS — W19XXXD Unspecified fall, subsequent encounter: Secondary | ICD-10-CM | POA: Diagnosis not present

## 2018-11-09 DIAGNOSIS — I2510 Atherosclerotic heart disease of native coronary artery without angina pectoris: Secondary | ICD-10-CM | POA: Diagnosis not present

## 2018-11-10 DIAGNOSIS — I10 Essential (primary) hypertension: Secondary | ICD-10-CM | POA: Diagnosis not present

## 2018-11-10 DIAGNOSIS — Z7982 Long term (current) use of aspirin: Secondary | ICD-10-CM | POA: Diagnosis not present

## 2018-11-10 DIAGNOSIS — S8002XD Contusion of left knee, subsequent encounter: Secondary | ICD-10-CM | POA: Diagnosis not present

## 2018-11-10 DIAGNOSIS — Z8673 Personal history of transient ischemic attack (TIA), and cerebral infarction without residual deficits: Secondary | ICD-10-CM | POA: Diagnosis not present

## 2018-11-10 DIAGNOSIS — I2510 Atherosclerotic heart disease of native coronary artery without angina pectoris: Secondary | ICD-10-CM | POA: Diagnosis not present

## 2018-11-10 DIAGNOSIS — L959 Vasculitis limited to the skin, unspecified: Secondary | ICD-10-CM | POA: Diagnosis not present

## 2018-11-10 DIAGNOSIS — Z7902 Long term (current) use of antithrombotics/antiplatelets: Secondary | ICD-10-CM | POA: Diagnosis not present

## 2018-11-10 DIAGNOSIS — M1990 Unspecified osteoarthritis, unspecified site: Secondary | ICD-10-CM | POA: Diagnosis not present

## 2018-11-10 DIAGNOSIS — M6282 Rhabdomyolysis: Secondary | ICD-10-CM | POA: Diagnosis not present

## 2018-11-10 DIAGNOSIS — J45909 Unspecified asthma, uncomplicated: Secondary | ICD-10-CM | POA: Diagnosis not present

## 2018-11-10 DIAGNOSIS — I480 Paroxysmal atrial fibrillation: Secondary | ICD-10-CM | POA: Diagnosis not present

## 2018-11-10 DIAGNOSIS — W19XXXD Unspecified fall, subsequent encounter: Secondary | ICD-10-CM | POA: Diagnosis not present

## 2018-11-10 DIAGNOSIS — G2581 Restless legs syndrome: Secondary | ICD-10-CM | POA: Diagnosis not present

## 2018-11-13 DIAGNOSIS — Z8673 Personal history of transient ischemic attack (TIA), and cerebral infarction without residual deficits: Secondary | ICD-10-CM | POA: Diagnosis not present

## 2018-11-13 DIAGNOSIS — I2510 Atherosclerotic heart disease of native coronary artery without angina pectoris: Secondary | ICD-10-CM | POA: Diagnosis not present

## 2018-11-13 DIAGNOSIS — G2581 Restless legs syndrome: Secondary | ICD-10-CM | POA: Diagnosis not present

## 2018-11-13 DIAGNOSIS — I10 Essential (primary) hypertension: Secondary | ICD-10-CM | POA: Diagnosis not present

## 2018-11-13 DIAGNOSIS — J45909 Unspecified asthma, uncomplicated: Secondary | ICD-10-CM | POA: Diagnosis not present

## 2018-11-13 DIAGNOSIS — S8002XD Contusion of left knee, subsequent encounter: Secondary | ICD-10-CM | POA: Diagnosis not present

## 2018-11-13 DIAGNOSIS — M1990 Unspecified osteoarthritis, unspecified site: Secondary | ICD-10-CM | POA: Diagnosis not present

## 2018-11-13 DIAGNOSIS — Z7982 Long term (current) use of aspirin: Secondary | ICD-10-CM | POA: Diagnosis not present

## 2018-11-13 DIAGNOSIS — W19XXXD Unspecified fall, subsequent encounter: Secondary | ICD-10-CM | POA: Diagnosis not present

## 2018-11-13 DIAGNOSIS — I480 Paroxysmal atrial fibrillation: Secondary | ICD-10-CM | POA: Diagnosis not present

## 2018-11-13 DIAGNOSIS — M6282 Rhabdomyolysis: Secondary | ICD-10-CM | POA: Diagnosis not present

## 2018-11-13 DIAGNOSIS — Z7902 Long term (current) use of antithrombotics/antiplatelets: Secondary | ICD-10-CM | POA: Diagnosis not present

## 2018-11-13 DIAGNOSIS — L959 Vasculitis limited to the skin, unspecified: Secondary | ICD-10-CM | POA: Diagnosis not present

## 2018-11-15 DIAGNOSIS — M1990 Unspecified osteoarthritis, unspecified site: Secondary | ICD-10-CM | POA: Diagnosis not present

## 2018-11-15 DIAGNOSIS — J45909 Unspecified asthma, uncomplicated: Secondary | ICD-10-CM | POA: Diagnosis not present

## 2018-11-15 DIAGNOSIS — L959 Vasculitis limited to the skin, unspecified: Secondary | ICD-10-CM | POA: Diagnosis not present

## 2018-11-15 DIAGNOSIS — G2581 Restless legs syndrome: Secondary | ICD-10-CM | POA: Diagnosis not present

## 2018-11-15 DIAGNOSIS — I10 Essential (primary) hypertension: Secondary | ICD-10-CM | POA: Diagnosis not present

## 2018-11-15 DIAGNOSIS — I480 Paroxysmal atrial fibrillation: Secondary | ICD-10-CM | POA: Diagnosis not present

## 2018-11-15 DIAGNOSIS — Z7982 Long term (current) use of aspirin: Secondary | ICD-10-CM | POA: Diagnosis not present

## 2018-11-15 DIAGNOSIS — Z8673 Personal history of transient ischemic attack (TIA), and cerebral infarction without residual deficits: Secondary | ICD-10-CM | POA: Diagnosis not present

## 2018-11-15 DIAGNOSIS — W19XXXD Unspecified fall, subsequent encounter: Secondary | ICD-10-CM | POA: Diagnosis not present

## 2018-11-15 DIAGNOSIS — M6282 Rhabdomyolysis: Secondary | ICD-10-CM | POA: Diagnosis not present

## 2018-11-15 DIAGNOSIS — S8002XD Contusion of left knee, subsequent encounter: Secondary | ICD-10-CM | POA: Diagnosis not present

## 2018-11-15 DIAGNOSIS — I2510 Atherosclerotic heart disease of native coronary artery without angina pectoris: Secondary | ICD-10-CM | POA: Diagnosis not present

## 2018-11-15 DIAGNOSIS — Z7902 Long term (current) use of antithrombotics/antiplatelets: Secondary | ICD-10-CM | POA: Diagnosis not present

## 2018-11-16 ENCOUNTER — Telehealth: Payer: Self-pay | Admitting: Nurse Practitioner

## 2018-11-16 DIAGNOSIS — N390 Urinary tract infection, site not specified: Secondary | ICD-10-CM | POA: Diagnosis not present

## 2018-11-16 DIAGNOSIS — I2510 Atherosclerotic heart disease of native coronary artery without angina pectoris: Secondary | ICD-10-CM | POA: Diagnosis not present

## 2018-11-16 DIAGNOSIS — J45909 Unspecified asthma, uncomplicated: Secondary | ICD-10-CM | POA: Diagnosis not present

## 2018-11-16 DIAGNOSIS — W19XXXD Unspecified fall, subsequent encounter: Secondary | ICD-10-CM | POA: Diagnosis not present

## 2018-11-16 DIAGNOSIS — G2581 Restless legs syndrome: Secondary | ICD-10-CM | POA: Diagnosis not present

## 2018-11-16 DIAGNOSIS — L959 Vasculitis limited to the skin, unspecified: Secondary | ICD-10-CM | POA: Diagnosis not present

## 2018-11-16 DIAGNOSIS — M1990 Unspecified osteoarthritis, unspecified site: Secondary | ICD-10-CM | POA: Diagnosis not present

## 2018-11-16 DIAGNOSIS — I480 Paroxysmal atrial fibrillation: Secondary | ICD-10-CM | POA: Diagnosis not present

## 2018-11-16 DIAGNOSIS — I10 Essential (primary) hypertension: Secondary | ICD-10-CM | POA: Diagnosis not present

## 2018-11-16 DIAGNOSIS — I119 Hypertensive heart disease without heart failure: Secondary | ICD-10-CM | POA: Diagnosis not present

## 2018-11-16 DIAGNOSIS — S8002XD Contusion of left knee, subsequent encounter: Secondary | ICD-10-CM | POA: Diagnosis not present

## 2018-11-16 DIAGNOSIS — Z7902 Long term (current) use of antithrombotics/antiplatelets: Secondary | ICD-10-CM | POA: Diagnosis not present

## 2018-11-16 DIAGNOSIS — Z7982 Long term (current) use of aspirin: Secondary | ICD-10-CM | POA: Diagnosis not present

## 2018-11-16 DIAGNOSIS — M150 Primary generalized (osteo)arthritis: Secondary | ICD-10-CM | POA: Diagnosis not present

## 2018-11-16 DIAGNOSIS — Z8673 Personal history of transient ischemic attack (TIA), and cerebral infarction without residual deficits: Secondary | ICD-10-CM | POA: Diagnosis not present

## 2018-11-16 DIAGNOSIS — M6282 Rhabdomyolysis: Secondary | ICD-10-CM | POA: Diagnosis not present

## 2018-11-16 NOTE — Telephone Encounter (Signed)
Called patient's son regarding appointment with Dr. Elease Hashimoto on 5/27 for post-hospital follow-up. Son states patient is in SNF and is evaluated regularly by [facility] MD. He denies cardiac concerns at this time. Due to the restrictions at the patient's SNF, patient cannot leave the facility without being placed in 14 day quarantine upon return. Son will call in the future with cardiac concerns or questions. He thanked me for the call.

## 2018-11-17 DIAGNOSIS — Z7902 Long term (current) use of antithrombotics/antiplatelets: Secondary | ICD-10-CM | POA: Diagnosis not present

## 2018-11-17 DIAGNOSIS — J45909 Unspecified asthma, uncomplicated: Secondary | ICD-10-CM | POA: Diagnosis not present

## 2018-11-17 DIAGNOSIS — I2510 Atherosclerotic heart disease of native coronary artery without angina pectoris: Secondary | ICD-10-CM | POA: Diagnosis not present

## 2018-11-17 DIAGNOSIS — M1990 Unspecified osteoarthritis, unspecified site: Secondary | ICD-10-CM | POA: Diagnosis not present

## 2018-11-17 DIAGNOSIS — Z8673 Personal history of transient ischemic attack (TIA), and cerebral infarction without residual deficits: Secondary | ICD-10-CM | POA: Diagnosis not present

## 2018-11-17 DIAGNOSIS — M6282 Rhabdomyolysis: Secondary | ICD-10-CM | POA: Diagnosis not present

## 2018-11-17 DIAGNOSIS — L959 Vasculitis limited to the skin, unspecified: Secondary | ICD-10-CM | POA: Diagnosis not present

## 2018-11-17 DIAGNOSIS — I480 Paroxysmal atrial fibrillation: Secondary | ICD-10-CM | POA: Diagnosis not present

## 2018-11-17 DIAGNOSIS — I10 Essential (primary) hypertension: Secondary | ICD-10-CM | POA: Diagnosis not present

## 2018-11-17 DIAGNOSIS — S8002XD Contusion of left knee, subsequent encounter: Secondary | ICD-10-CM | POA: Diagnosis not present

## 2018-11-17 DIAGNOSIS — W19XXXD Unspecified fall, subsequent encounter: Secondary | ICD-10-CM | POA: Diagnosis not present

## 2018-11-17 DIAGNOSIS — G2581 Restless legs syndrome: Secondary | ICD-10-CM | POA: Diagnosis not present

## 2018-11-17 DIAGNOSIS — Z7982 Long term (current) use of aspirin: Secondary | ICD-10-CM | POA: Diagnosis not present

## 2018-11-21 DIAGNOSIS — I480 Paroxysmal atrial fibrillation: Secondary | ICD-10-CM | POA: Diagnosis not present

## 2018-11-21 DIAGNOSIS — S8002XD Contusion of left knee, subsequent encounter: Secondary | ICD-10-CM | POA: Diagnosis not present

## 2018-11-21 DIAGNOSIS — W19XXXD Unspecified fall, subsequent encounter: Secondary | ICD-10-CM | POA: Diagnosis not present

## 2018-11-21 DIAGNOSIS — L959 Vasculitis limited to the skin, unspecified: Secondary | ICD-10-CM | POA: Diagnosis not present

## 2018-11-21 DIAGNOSIS — J45909 Unspecified asthma, uncomplicated: Secondary | ICD-10-CM | POA: Diagnosis not present

## 2018-11-21 DIAGNOSIS — Z7902 Long term (current) use of antithrombotics/antiplatelets: Secondary | ICD-10-CM | POA: Diagnosis not present

## 2018-11-21 DIAGNOSIS — Z8673 Personal history of transient ischemic attack (TIA), and cerebral infarction without residual deficits: Secondary | ICD-10-CM | POA: Diagnosis not present

## 2018-11-21 DIAGNOSIS — M6282 Rhabdomyolysis: Secondary | ICD-10-CM | POA: Diagnosis not present

## 2018-11-21 DIAGNOSIS — G2581 Restless legs syndrome: Secondary | ICD-10-CM | POA: Diagnosis not present

## 2018-11-21 DIAGNOSIS — M1990 Unspecified osteoarthritis, unspecified site: Secondary | ICD-10-CM | POA: Diagnosis not present

## 2018-11-21 DIAGNOSIS — Z7982 Long term (current) use of aspirin: Secondary | ICD-10-CM | POA: Diagnosis not present

## 2018-11-21 DIAGNOSIS — I2510 Atherosclerotic heart disease of native coronary artery without angina pectoris: Secondary | ICD-10-CM | POA: Diagnosis not present

## 2018-11-21 DIAGNOSIS — I10 Essential (primary) hypertension: Secondary | ICD-10-CM | POA: Diagnosis not present

## 2018-11-22 ENCOUNTER — Ambulatory Visit: Payer: Medicare Other | Admitting: Cardiovascular Disease

## 2018-11-24 DIAGNOSIS — I2510 Atherosclerotic heart disease of native coronary artery without angina pectoris: Secondary | ICD-10-CM | POA: Diagnosis not present

## 2018-11-24 DIAGNOSIS — I480 Paroxysmal atrial fibrillation: Secondary | ICD-10-CM | POA: Diagnosis not present

## 2018-11-24 DIAGNOSIS — M6282 Rhabdomyolysis: Secondary | ICD-10-CM | POA: Diagnosis not present

## 2018-11-24 DIAGNOSIS — Z8673 Personal history of transient ischemic attack (TIA), and cerebral infarction without residual deficits: Secondary | ICD-10-CM | POA: Diagnosis not present

## 2018-11-24 DIAGNOSIS — S8002XD Contusion of left knee, subsequent encounter: Secondary | ICD-10-CM | POA: Diagnosis not present

## 2018-11-24 DIAGNOSIS — Z7902 Long term (current) use of antithrombotics/antiplatelets: Secondary | ICD-10-CM | POA: Diagnosis not present

## 2018-11-24 DIAGNOSIS — G2581 Restless legs syndrome: Secondary | ICD-10-CM | POA: Diagnosis not present

## 2018-11-24 DIAGNOSIS — J45909 Unspecified asthma, uncomplicated: Secondary | ICD-10-CM | POA: Diagnosis not present

## 2018-11-24 DIAGNOSIS — Z7982 Long term (current) use of aspirin: Secondary | ICD-10-CM | POA: Diagnosis not present

## 2018-11-24 DIAGNOSIS — I10 Essential (primary) hypertension: Secondary | ICD-10-CM | POA: Diagnosis not present

## 2018-11-24 DIAGNOSIS — W19XXXD Unspecified fall, subsequent encounter: Secondary | ICD-10-CM | POA: Diagnosis not present

## 2018-11-24 DIAGNOSIS — M1990 Unspecified osteoarthritis, unspecified site: Secondary | ICD-10-CM | POA: Diagnosis not present

## 2018-11-24 DIAGNOSIS — L959 Vasculitis limited to the skin, unspecified: Secondary | ICD-10-CM | POA: Diagnosis not present

## 2018-11-28 DIAGNOSIS — I2510 Atherosclerotic heart disease of native coronary artery without angina pectoris: Secondary | ICD-10-CM | POA: Diagnosis not present

## 2018-11-28 DIAGNOSIS — I480 Paroxysmal atrial fibrillation: Secondary | ICD-10-CM | POA: Diagnosis not present

## 2018-11-28 DIAGNOSIS — M6282 Rhabdomyolysis: Secondary | ICD-10-CM | POA: Diagnosis not present

## 2018-11-28 DIAGNOSIS — Z7902 Long term (current) use of antithrombotics/antiplatelets: Secondary | ICD-10-CM | POA: Diagnosis not present

## 2018-11-28 DIAGNOSIS — L959 Vasculitis limited to the skin, unspecified: Secondary | ICD-10-CM | POA: Diagnosis not present

## 2018-11-28 DIAGNOSIS — I10 Essential (primary) hypertension: Secondary | ICD-10-CM | POA: Diagnosis not present

## 2018-11-28 DIAGNOSIS — J45909 Unspecified asthma, uncomplicated: Secondary | ICD-10-CM | POA: Diagnosis not present

## 2018-11-28 DIAGNOSIS — G2581 Restless legs syndrome: Secondary | ICD-10-CM | POA: Diagnosis not present

## 2018-11-28 DIAGNOSIS — Z7982 Long term (current) use of aspirin: Secondary | ICD-10-CM | POA: Diagnosis not present

## 2018-11-28 DIAGNOSIS — Z8673 Personal history of transient ischemic attack (TIA), and cerebral infarction without residual deficits: Secondary | ICD-10-CM | POA: Diagnosis not present

## 2018-11-28 DIAGNOSIS — Z9181 History of falling: Secondary | ICD-10-CM | POA: Diagnosis not present

## 2018-11-28 DIAGNOSIS — M1990 Unspecified osteoarthritis, unspecified site: Secondary | ICD-10-CM | POA: Diagnosis not present

## 2018-11-29 DIAGNOSIS — I480 Paroxysmal atrial fibrillation: Secondary | ICD-10-CM | POA: Diagnosis not present

## 2018-11-29 DIAGNOSIS — M6282 Rhabdomyolysis: Secondary | ICD-10-CM | POA: Diagnosis not present

## 2018-11-29 DIAGNOSIS — G2581 Restless legs syndrome: Secondary | ICD-10-CM | POA: Diagnosis not present

## 2018-11-29 DIAGNOSIS — M1990 Unspecified osteoarthritis, unspecified site: Secondary | ICD-10-CM | POA: Diagnosis not present

## 2018-11-29 DIAGNOSIS — L959 Vasculitis limited to the skin, unspecified: Secondary | ICD-10-CM | POA: Diagnosis not present

## 2018-11-29 DIAGNOSIS — Z9181 History of falling: Secondary | ICD-10-CM | POA: Diagnosis not present

## 2018-11-29 DIAGNOSIS — Z7982 Long term (current) use of aspirin: Secondary | ICD-10-CM | POA: Diagnosis not present

## 2018-11-29 DIAGNOSIS — J45909 Unspecified asthma, uncomplicated: Secondary | ICD-10-CM | POA: Diagnosis not present

## 2018-11-29 DIAGNOSIS — I10 Essential (primary) hypertension: Secondary | ICD-10-CM | POA: Diagnosis not present

## 2018-11-29 DIAGNOSIS — Z7902 Long term (current) use of antithrombotics/antiplatelets: Secondary | ICD-10-CM | POA: Diagnosis not present

## 2018-11-29 DIAGNOSIS — Z8673 Personal history of transient ischemic attack (TIA), and cerebral infarction without residual deficits: Secondary | ICD-10-CM | POA: Diagnosis not present

## 2018-11-29 DIAGNOSIS — I2510 Atherosclerotic heart disease of native coronary artery without angina pectoris: Secondary | ICD-10-CM | POA: Diagnosis not present

## 2018-12-06 DIAGNOSIS — J45909 Unspecified asthma, uncomplicated: Secondary | ICD-10-CM | POA: Diagnosis not present

## 2018-12-06 DIAGNOSIS — M1990 Unspecified osteoarthritis, unspecified site: Secondary | ICD-10-CM | POA: Diagnosis not present

## 2018-12-06 DIAGNOSIS — M6282 Rhabdomyolysis: Secondary | ICD-10-CM | POA: Diagnosis not present

## 2018-12-06 DIAGNOSIS — L959 Vasculitis limited to the skin, unspecified: Secondary | ICD-10-CM | POA: Diagnosis not present

## 2018-12-06 DIAGNOSIS — Z9181 History of falling: Secondary | ICD-10-CM | POA: Diagnosis not present

## 2018-12-06 DIAGNOSIS — I480 Paroxysmal atrial fibrillation: Secondary | ICD-10-CM | POA: Diagnosis not present

## 2018-12-06 DIAGNOSIS — G2581 Restless legs syndrome: Secondary | ICD-10-CM | POA: Diagnosis not present

## 2018-12-06 DIAGNOSIS — Z7902 Long term (current) use of antithrombotics/antiplatelets: Secondary | ICD-10-CM | POA: Diagnosis not present

## 2018-12-06 DIAGNOSIS — I10 Essential (primary) hypertension: Secondary | ICD-10-CM | POA: Diagnosis not present

## 2018-12-06 DIAGNOSIS — I2510 Atherosclerotic heart disease of native coronary artery without angina pectoris: Secondary | ICD-10-CM | POA: Diagnosis not present

## 2018-12-06 DIAGNOSIS — Z7982 Long term (current) use of aspirin: Secondary | ICD-10-CM | POA: Diagnosis not present

## 2018-12-06 DIAGNOSIS — Z8673 Personal history of transient ischemic attack (TIA), and cerebral infarction without residual deficits: Secondary | ICD-10-CM | POA: Diagnosis not present

## 2018-12-07 DIAGNOSIS — K219 Gastro-esophageal reflux disease without esophagitis: Secondary | ICD-10-CM | POA: Diagnosis not present

## 2018-12-07 DIAGNOSIS — E039 Hypothyroidism, unspecified: Secondary | ICD-10-CM | POA: Diagnosis not present

## 2018-12-07 DIAGNOSIS — K5901 Slow transit constipation: Secondary | ICD-10-CM | POA: Diagnosis not present

## 2018-12-07 DIAGNOSIS — R531 Weakness: Secondary | ICD-10-CM | POA: Diagnosis not present

## 2018-12-07 DIAGNOSIS — I10 Essential (primary) hypertension: Secondary | ICD-10-CM | POA: Diagnosis not present

## 2018-12-10 ENCOUNTER — Emergency Department (HOSPITAL_COMMUNITY): Payer: Medicare Other

## 2018-12-10 ENCOUNTER — Encounter (HOSPITAL_COMMUNITY): Payer: Self-pay | Admitting: *Deleted

## 2018-12-10 ENCOUNTER — Other Ambulatory Visit: Payer: Self-pay

## 2018-12-10 ENCOUNTER — Observation Stay (HOSPITAL_COMMUNITY)
Admission: EM | Admit: 2018-12-10 | Discharge: 2018-12-12 | Payer: Medicare Other | Attending: Internal Medicine | Admitting: Internal Medicine

## 2018-12-10 DIAGNOSIS — E039 Hypothyroidism, unspecified: Secondary | ICD-10-CM | POA: Insufficient documentation

## 2018-12-10 DIAGNOSIS — E785 Hyperlipidemia, unspecified: Secondary | ICD-10-CM | POA: Diagnosis not present

## 2018-12-10 DIAGNOSIS — Z8249 Family history of ischemic heart disease and other diseases of the circulatory system: Secondary | ICD-10-CM | POA: Insufficient documentation

## 2018-12-10 DIAGNOSIS — Z888 Allergy status to other drugs, medicaments and biological substances status: Secondary | ICD-10-CM | POA: Diagnosis not present

## 2018-12-10 DIAGNOSIS — I2510 Atherosclerotic heart disease of native coronary artery without angina pectoris: Secondary | ICD-10-CM | POA: Insufficient documentation

## 2018-12-10 DIAGNOSIS — Z823 Family history of stroke: Secondary | ICD-10-CM | POA: Insufficient documentation

## 2018-12-10 DIAGNOSIS — R0789 Other chest pain: Principal | ICD-10-CM | POA: Insufficient documentation

## 2018-12-10 DIAGNOSIS — Z880 Allergy status to penicillin: Secondary | ICD-10-CM | POA: Diagnosis not present

## 2018-12-10 DIAGNOSIS — R0902 Hypoxemia: Secondary | ICD-10-CM | POA: Diagnosis not present

## 2018-12-10 DIAGNOSIS — R079 Chest pain, unspecified: Secondary | ICD-10-CM | POA: Diagnosis present

## 2018-12-10 DIAGNOSIS — Z885 Allergy status to narcotic agent status: Secondary | ICD-10-CM | POA: Insufficient documentation

## 2018-12-10 DIAGNOSIS — R7989 Other specified abnormal findings of blood chemistry: Secondary | ICD-10-CM | POA: Diagnosis not present

## 2018-12-10 DIAGNOSIS — Z66 Do not resuscitate: Secondary | ICD-10-CM | POA: Diagnosis not present

## 2018-12-10 DIAGNOSIS — Z7902 Long term (current) use of antithrombotics/antiplatelets: Secondary | ICD-10-CM | POA: Insufficient documentation

## 2018-12-10 DIAGNOSIS — E876 Hypokalemia: Secondary | ICD-10-CM | POA: Diagnosis not present

## 2018-12-10 DIAGNOSIS — E669 Obesity, unspecified: Secondary | ICD-10-CM | POA: Insufficient documentation

## 2018-12-10 DIAGNOSIS — Z1159 Encounter for screening for other viral diseases: Secondary | ICD-10-CM | POA: Insufficient documentation

## 2018-12-10 DIAGNOSIS — Z79890 Hormone replacement therapy (postmenopausal): Secondary | ICD-10-CM | POA: Insufficient documentation

## 2018-12-10 DIAGNOSIS — R778 Other specified abnormalities of plasma proteins: Secondary | ICD-10-CM

## 2018-12-10 DIAGNOSIS — Z6829 Body mass index (BMI) 29.0-29.9, adult: Secondary | ICD-10-CM | POA: Diagnosis not present

## 2018-12-10 DIAGNOSIS — Z7982 Long term (current) use of aspirin: Secondary | ICD-10-CM | POA: Diagnosis not present

## 2018-12-10 DIAGNOSIS — Z79899 Other long term (current) drug therapy: Secondary | ICD-10-CM | POA: Insufficient documentation

## 2018-12-10 DIAGNOSIS — Z8673 Personal history of transient ischemic attack (TIA), and cerebral infarction without residual deficits: Secondary | ICD-10-CM | POA: Diagnosis not present

## 2018-12-10 DIAGNOSIS — R000 Tachycardia, unspecified: Secondary | ICD-10-CM | POA: Diagnosis not present

## 2018-12-10 DIAGNOSIS — I252 Old myocardial infarction: Secondary | ICD-10-CM | POA: Diagnosis not present

## 2018-12-10 DIAGNOSIS — I10 Essential (primary) hypertension: Secondary | ICD-10-CM | POA: Diagnosis not present

## 2018-12-10 HISTORY — DX: Cerebral infarction, unspecified: I63.9

## 2018-12-10 HISTORY — DX: Rhabdomyolysis: M62.82

## 2018-12-10 HISTORY — DX: Metabolic encephalopathy: G93.41

## 2018-12-10 LAB — CBC WITH DIFFERENTIAL/PLATELET
Abs Immature Granulocytes: 0.01 10*3/uL (ref 0.00–0.07)
Basophils Absolute: 0 10*3/uL (ref 0.0–0.1)
Basophils Relative: 1 %
Eosinophils Absolute: 0.1 10*3/uL (ref 0.0–0.5)
Eosinophils Relative: 1 %
HCT: 42.9 % (ref 36.0–46.0)
Hemoglobin: 14.1 g/dL (ref 12.0–15.0)
Immature Granulocytes: 0 %
Lymphocytes Relative: 22 %
Lymphs Abs: 1.5 10*3/uL (ref 0.7–4.0)
MCH: 28.6 pg (ref 26.0–34.0)
MCHC: 32.9 g/dL (ref 30.0–36.0)
MCV: 87 fL (ref 80.0–100.0)
Monocytes Absolute: 0.5 10*3/uL (ref 0.1–1.0)
Monocytes Relative: 7 %
Neutro Abs: 4.6 10*3/uL (ref 1.7–7.7)
Neutrophils Relative %: 69 %
Platelets: 196 10*3/uL (ref 150–400)
RBC: 4.93 MIL/uL (ref 3.87–5.11)
RDW: 13.9 % (ref 11.5–15.5)
WBC: 6.7 10*3/uL (ref 4.0–10.5)
nRBC: 0 % (ref 0.0–0.2)

## 2018-12-10 LAB — COMPREHENSIVE METABOLIC PANEL
ALT: 18 U/L (ref 0–44)
AST: 24 U/L (ref 15–41)
Albumin: 3.9 g/dL (ref 3.5–5.0)
Alkaline Phosphatase: 43 U/L (ref 38–126)
Anion gap: 12 (ref 5–15)
BUN: 9 mg/dL (ref 8–23)
CO2: 29 mmol/L (ref 22–32)
Calcium: 9.6 mg/dL (ref 8.9–10.3)
Chloride: 103 mmol/L (ref 98–111)
Creatinine, Ser: 0.71 mg/dL (ref 0.44–1.00)
GFR calc Af Amer: >60 mL/min (ref >60)
GFR calc non Af Amer: >60 mL/min (ref >60)
Glucose, Bld: 105 mg/dL — ABNORMAL HIGH (ref 70–99)
Potassium: 2.8 mmol/L — ABNORMAL LOW (ref 3.5–5.1)
Sodium: 144 mmol/L (ref 135–145)
Total Bilirubin: 0.6 mg/dL (ref 0.3–1.2)
Total Protein: 6.9 g/dL (ref 6.5–8.1)

## 2018-12-10 LAB — TROPONIN I: Troponin I: 0.03 ng/mL (ref <0.03)

## 2018-12-10 LAB — MAGNESIUM: Magnesium: 1.7 mg/dL (ref 1.7–2.4)

## 2018-12-10 LAB — BRAIN NATRIURETIC PEPTIDE: B Natriuretic Peptide: 54.9 pg/mL (ref 0.0–100.0)

## 2018-12-10 MED ORDER — ACETAMINOPHEN ER 650 MG PO TBCR: 1300.0000 mg | EXTENDED_RELEASE_TABLET | Freq: Two times a day (BID) | ORAL | Status: DC

## 2018-12-10 MED ORDER — ONDANSETRON HCL 4 MG/2ML IJ SOLN: 4.0000 mg | Freq: Four times a day (QID) | INTRAMUSCULAR | Status: DC | PRN

## 2018-12-10 MED ORDER — ACETAMINOPHEN 325 MG PO TABS
650.0000 mg | ORAL_TABLET | ORAL | Status: DC | PRN
  Administered 2018-12-11: 650 mg via ORAL
  Filled 2018-12-10: qty 2

## 2018-12-10 MED ORDER — CALCIUM CARBONATE-VITAMIN D 500-200 MG-UNIT PO TABS
1.0000 | ORAL_TABLET | Freq: Every day | ORAL | Status: DC
  Administered 2018-12-11 – 2018-12-12 (×2): 1 via ORAL
  Filled 2018-12-10 (×2): qty 1

## 2018-12-10 MED ORDER — ASPIRIN 81 MG PO CHEW
244.0000 mg | CHEWABLE_TABLET | Freq: Once | ORAL | Status: AC
  Administered 2018-12-10: 244 mg via ORAL
  Filled 2018-12-10: qty 4

## 2018-12-10 MED ORDER — METOPROLOL TARTRATE 12.5 MG HALF TABLET
12.5000 mg | ORAL_TABLET | Freq: Two times a day (BID) | ORAL | Status: DC
  Administered 2018-12-10 – 2018-12-11 (×2): 12.5 mg via ORAL
  Filled 2018-12-10 (×2): qty 1

## 2018-12-10 MED ORDER — ASPIRIN EC 81 MG PO TBEC
81.0000 mg | DELAYED_RELEASE_TABLET | Freq: Every day | ORAL | Status: DC
  Administered 2018-12-11 – 2018-12-12 (×2): 81 mg via ORAL
  Filled 2018-12-10 (×2): qty 1

## 2018-12-10 MED ORDER — NITROGLYCERIN 0.4 MG SL SUBL: 0.4000 mg | SUBLINGUAL_TABLET | SUBLINGUAL | Status: DC | PRN

## 2018-12-10 MED ORDER — SIMVASTATIN 20 MG PO TABS
20.0000 mg | ORAL_TABLET | Freq: Every day | ORAL | Status: DC
  Administered 2018-12-10 – 2018-12-11 (×2): 20 mg via ORAL
  Filled 2018-12-10 (×2): qty 1

## 2018-12-10 MED ORDER — POTASSIUM CHLORIDE 10 MEQ/100ML IV SOLN
10.0000 meq | Freq: Once | INTRAVENOUS | Status: AC
  Administered 2018-12-10: 23:00:00 10 meq via INTRAVENOUS
  Filled 2018-12-10: qty 100

## 2018-12-10 MED ORDER — PANTOPRAZOLE SODIUM 20 MG PO TBEC
20.0000 mg | DELAYED_RELEASE_TABLET | Freq: Every day | ORAL | Status: DC
  Administered 2018-12-11 – 2018-12-12 (×2): 20 mg via ORAL
  Filled 2018-12-10 (×2): qty 1

## 2018-12-10 MED ORDER — POTASSIUM CHLORIDE CRYS ER 20 MEQ PO TBCR
40.0000 meq | EXTENDED_RELEASE_TABLET | Freq: Once | ORAL | Status: AC
  Administered 2018-12-10: 40 meq via ORAL
  Filled 2018-12-10: qty 2

## 2018-12-10 MED ORDER — AMLODIPINE BESYLATE 2.5 MG PO TABS
5.0000 mg | ORAL_TABLET | Freq: Every day | ORAL | Status: DC
  Administered 2018-12-11 – 2018-12-12 (×2): 5 mg via ORAL
  Filled 2018-12-10 (×2): qty 2

## 2018-12-10 MED ORDER — LEVOTHYROXINE SODIUM 100 MCG PO TABS
100.0000 ug | ORAL_TABLET | Freq: Every day | ORAL | Status: DC
  Administered 2018-12-12: 100 ug via ORAL
  Filled 2018-12-10 (×2): qty 1

## 2018-12-10 MED ORDER — VITAMIN B-12 1000 MCG PO TABS
2500.0000 ug | ORAL_TABLET | Freq: Every day | ORAL | Status: DC
  Administered 2018-12-11 – 2018-12-12 (×2): 2500 ug via ORAL
  Filled 2018-12-10 (×2): qty 3

## 2018-12-10 MED ORDER — HYDRALAZINE HCL 20 MG/ML IJ SOLN: 10.0000 mg | INTRAMUSCULAR | Status: DC | PRN

## 2018-12-10 MED ORDER — MIRABEGRON ER 50 MG PO TB24
50.0000 mg | ORAL_TABLET | Freq: Every day | ORAL | Status: DC
  Administered 2018-12-11 – 2018-12-12 (×2): 50 mg via ORAL
  Filled 2018-12-10 (×2): qty 1

## 2018-12-10 MED ORDER — CLOPIDOGREL BISULFATE 75 MG PO TABS
75.0000 mg | ORAL_TABLET | Freq: Every day | ORAL | Status: DC
  Administered 2018-12-11 – 2018-12-12 (×2): 75 mg via ORAL
  Filled 2018-12-10 (×2): qty 1

## 2018-12-10 MED ORDER — GABAPENTIN 400 MG PO CAPS
400.0000 mg | ORAL_CAPSULE | Freq: Four times a day (QID) | ORAL | Status: DC
  Administered 2018-12-10 – 2018-12-12 (×6): 400 mg via ORAL
  Filled 2018-12-10 (×6): qty 1

## 2018-12-10 MED ORDER — ADULT MULTIVITAMIN W/MINERALS CH
1.0000 | ORAL_TABLET | Freq: Every day | ORAL | Status: DC
  Administered 2018-12-11 – 2018-12-12 (×2): 1 via ORAL
  Filled 2018-12-10 (×2): qty 1

## 2018-12-10 MED ORDER — IPRATROPIUM-ALBUTEROL 0.5-2.5 (3) MG/3ML IN SOLN: 3.0000 mL | Freq: Four times a day (QID) | RESPIRATORY_TRACT | Status: DC | PRN

## 2018-12-10 MED ORDER — ENOXAPARIN SODIUM 40 MG/0.4ML ~~LOC~~ SOLN
40.0000 mg | SUBCUTANEOUS | Status: DC
  Administered 2018-12-10 – 2018-12-11 (×2): 40 mg via SUBCUTANEOUS
  Filled 2018-12-10 (×2): qty 0.4

## 2018-12-10 NOTE — ED Provider Notes (Signed)
[facility] EMERGENCY DEPARTMENT Provider Note   CSN: [id number] Arrival date & time: 12/10/18  1802    History   Chief Complaint Chief Complaint  Patient presents with  . Chest Pain    HPI Kimberly Bass is a 85 y.o. female.     Kimberly Bass is a 85 y.o. female with a history of CAD, hypertension, hyperlipidemia, MRI, thyroid disease, and stroke, who presents to the emergency department from [facility] via EMS for evaluation of chest pain.  Patient reports this afternoon she had episode of severe left-sided chest pain she describes as an intense pressure that radiated to the left arm.  Chest pain came on at rest and was relieved by 3 tablets of nitroglycerin.  Nitro is prescribed to the patient, but she reports she never has to use it until today.  Since nitro patient has been chest pain-free.  No EKG changes noted with EMS, no other medications given while in transit.  She denies associated shortness of breath, lightheadedness or syncope, or lower extremity swelling.  She notes that 4 days prior her blood pressure has been higher than usual, reporting that on Thursday it was 200/110, she was restarted on amlodipine, reports today her blood pressure was in the 160s-170s systolic, but improved after she was given nitro.  She has not had any chest pain over the past 4 days aside from sutures prior to arrival.  Denies any nausea vomiting or diaphoresis, no abdominal pain.  No other aggravating or alleviating factors.     Past Medical History:  Diagnosis Date  . Arthritis    "mild; in all my joints" (04/27/2017)  . Coronary artery disease   . Cystitis with hematuria 04/26/2017  . Gait disorder   . H/O radioactive iodine thyroid ablation   . Hx of transient ischemic attack (TIA)   . Hyperlipidemia   . Hypertension    "not since MI in 2007" (04/27/2017)  . Hyperthyroidism    H/O radioactive iodine thyroid ablation [Z92.3]  . Hypothyroidism   . Lower  extremity myoclonus   . Lumbosacral spondylosis   . Metabolic encephalopathy   . Migraine    "I've had one in my lifetime" (04/27/2017)  . Mild obesity   . Myocardial infarction (HCC) 12/2005  . Myoclonus    Lower extremity involvement  . Obesity   . Pneumonia    "lots when I was a small child; not at all since" (04/27/2017)  . Rhabdomyolysis   . Seizures (HCC)    History is questionable  . Stroke (HCC)   . Thyroid disease     Patient Active Problem List   Diagnosis Date Noted  . 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 Asthmatic bronchitis 09/04/2018  . Syncope 09/01/2018  . PAF (paroxysmal atrial fibrillation) (HCC) 09/01/2018  . Traumatic hematoma of knee, left, initial encounter 09/01/2018  . RLS (restless legs syndrome) 09/01/2018  . Bradycardia 08/30/2018  . Elevated troponin 08/30/2018  . Rhabdomyolysis 08/29/2018  . Hypomagnesemia   . Pyuria 08/01/2018  . AMS (altered mental status) 08/01/2018  . Altered mental status 08/01/2018  . Hypothyroidism 12/12/2017  . Acute encephalopathy 12/12/2017  . Essential hypertension 12/12/2017  . Hypokalemia 12/12/2017  . Hyperlipidemia 05/05/2017  . Urinary tract infection with hematuria 04/27/2017  . Tremor, essential 11/03/2015  . 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 Abnormality of gait 08/15/2012  . Essential and other specified forms of tremor 08/15/2012  . Degeneration of lumbar or lumbosacral intervertebral disc 08/15/2012  . Hx of  low back pain 08/15/2012  . CAD (coronary artery disease) 12/23/2010  . Hx of transient ischemic attack (TIA)   . CONSTIPATION 02/27/2009  . PERSONAL HX COLONIC POLYPS 02/27/2009    Past Surgical History:  Procedure Laterality Date  . ABDOMINAL HYSTERECTOMY  1972   for uterine fibroids/notes 11/10/2010  . ANTERIOR CERVICAL DECOMP/DISCECTOMY FUSION  03/2004   Archie Endo 11/10/2010  . APPENDECTOMY    . BACK SURGERY    . BREAST BIOPSY Right 1996   nodule resected/notes 11/10/2010  . CARDIAC CATHETERIZATION   01/03/2006  . CATARACT EXTRACTION W/ INTRAOCULAR LENS  IMPLANT, BILATERAL Bilateral   . COLONOSCOPY  11/2003   Archie Endo 11/10/2010  . ESOPHAGOGASTRODUODENOSCOPY  09/2006   Archie Endo 11/10/2010  . LUMBAR DISC SURGERY  1990s X 1; 05/2003   "a nerve was pinching"; Archie Endo 11/10/2010  . OVARY SURGERY Left 1999   benign tumor resected /notes 11/10/2010  . TONSILLECTOMY  ~ 1947     OB History   No obstetric history on file.      Home Medications    Prior to Admission medications   Medication Sig Start Date End Date Taking? Authorizing Provider  acetaminophen (TYLENOL ARTHRITIS PAIN) 650 MG CR tablet Take 1,300 mg by mouth 2 (two) times a day.    Yes [provider]  amLODipine (NORVASC) 5 MG tablet Take 5 mg by mouth daily. 12/07/18  Yes [provider]  aspirin EC 81 MG tablet Take 81 mg by mouth daily.    Yes [provider]  calcium-vitamin D (CALCIUM 500+D HIGH POTENCY) 500-400 MG-UNIT tablet Take 1 tablet by mouth daily.    Yes [provider]  clopidogrel (PLAVIX) 75 MG tablet TAKE 1 TABLET BY MOUTH  DAILY Patient taking differently: Take 75 mg by mouth daily.  07/04/18  Yes Nahser, Wonda Cheng, MD  Cyanocobalamin (VITAMIN B-12) 2500 MCG SUBL Place 2,500 mcg under the tongue daily.   Yes [provider]  gabapentin (NEURONTIN) 400 MG capsule TAKE 1 CAPSULE (400 MG TOTAL) BY MOUTH 4 (FOUR) TIMES DAILY. 07/10/18  Yes Ward Givens, NP  levothyroxine (SYNTHROID, LEVOTHROID) 100 MCG tablet Take 100 mcg by mouth daily before breakfast.   Yes [provider]  metoprolol tartrate (LOPRESSOR) 25 MG tablet Take 12.5 mg by mouth 2 (two) times daily.   Yes [provider]  Multiple Vitamin (MULTIVITAMIN WITH MINERALS) TABS tablet Take 1 tablet by mouth daily.   Yes [provider]  NITROSTAT 0.4 MG SL tablet DISSOLVE 1 TAB UNDER THE  TONGUE EVERY 5 MINUTES AS  NEEDED FOR CHEST PAIN. MAX  3 TOTAL DOSES/15MIN. CALL  911 IF NO RELIEF  Patient taking differently: Place 0.4 mg under the tongue every 5 (five) minutes x 3 doses as needed for chest pain (AND CALL 9-1-1-, IF NO RELIEF).  09/11/18  Yes Nahser, Wonda Cheng, MD  pantoprazole (PROTONIX) 20 MG tablet Take 20 mg by mouth daily before breakfast. DO NOT CRUSH   Yes [provider]  simvastatin (ZOCOR) 20 MG tablet Take 20 mg by mouth at bedtime.    Yes [provider]  ipratropium-albuterol (DUONEB) 0.5-2.5 (3) MG/3ML SOLN Take 3 mLs by nebulization. INHALE 1 VIAL VIA NEBULIZER EVERY 6 HOURS AS NEEDED FOR WHEEZING    [provider]  magnesium hydroxide (MILK OF MAGNESIA) 400 MG/5ML suspension Constipation (1 of 4): If no BM in 3 days, give 30 cc Milk of Magnesium p.o. x 1 dose in 24 hours as needed (Do not  use standing constipation orders for residents with renal failure CFR less than 30. Contact MD for orders)    [provider]  NON FORMULARY Mighty Shake (chocolate if available) One by mouth BID    [provider]  oxybutynin (DITROPAN XL) 15 MG 24 hr tablet Take 15 mg by mouth. TAKE 1 TABLET BY MOUTH ONCE DAILY FOR OVERACTIVE BLADDER (DO NOT CRUSH)    [provider]    Family History Family History  Problem Relation Age of Onset  . Heart disease Father   . Heart attack Mother   . Stroke Sister   . Heart attack Sister   . Heart attack Brother        Possible  . Diabetes Sister     Social History Social History   Tobacco Use  . Smoking status: Never Smoker  . Smokeless tobacco: Never Used  Substance Use Topics  . Alcohol use: No  . Drug use: No     Allergies   Codeine, Meperidine hcl, Morphine, and Penicillins   Review of Systems Review of Systems  Constitutional: Negative for chills and fever.  HENT: Negative.   Eyes: Negative for visual disturbance.  Respiratory: Negative for cough and shortness of breath.   Cardiovascular: Positive for chest pain. Negative for palpitations and leg swelling.   Gastrointestinal: Negative for abdominal pain, nausea and vomiting.  Genitourinary: Negative for dysuria.  Musculoskeletal: Negative for arthralgias and myalgias.  Skin: Negative for color change and rash.  Neurological: Negative for dizziness, syncope, weakness, light-headedness and numbness.     Physical Exam Updated Vital Signs BP (!) 162/96   Pulse (!) 103   Temp 98.2 F (36.8 C) (Oral)   Resp 12   Ht 4\' 11"  (1.499 m)   Wt 67 kg   SpO2 96%   BMI 29.83 kg/m   Physical Exam Vitals signs and nursing note reviewed.  Constitutional:      General: She is not in acute distress.    Appearance: She is well-developed and normal weight. She is not ill-appearing or diaphoretic.  HENT:     Head: Normocephalic and atraumatic.  Eyes:     General:        Right eye: No discharge.        Left eye: No discharge.     Pupils: Pupils are equal, round, and reactive to light.  Neck:     Musculoskeletal: Neck supple.  Cardiovascular:     Rate and Rhythm: Normal rate and regular rhythm.     Pulses:          Radial pulses are 2+ on the right side and 2+ on the left side.       Dorsalis pedis pulses are 2+ on the right side and 2+ on the left side.     Heart sounds: Normal heart sounds. No murmur. No friction rub. No gallop.   Pulmonary:     Effort: Pulmonary effort is normal. No respiratory distress.     Breath sounds: Normal breath sounds. No wheezing or rales.     Comments: Respirations equal and unlabored, patient able to speak in full sentences, lungs clear to auscultation bilaterally Chest:     Chest wall: No tenderness.  Abdominal:     General: Bowel sounds are normal. There is no distension.     Palpations: Abdomen is soft. There is no mass.     Tenderness: There is no abdominal tenderness. There is no guarding.     Comments: Abdomen  soft, nondistended, nontender to palpation in all quadrants without guarding or peritoneal signs  Musculoskeletal:        General: No deformity.      Right lower leg: She exhibits no tenderness. No edema.     Left lower leg: She exhibits no tenderness. No edema.     Comments: Bilateral lower extremities warm and well perfused without edema or tenderness.  Skin:    General: Skin is warm and dry.     Capillary Refill: Capillary refill takes less than 2 seconds.  Neurological:     Mental Status: She is alert and oriented to person, place, and time.     Coordination: Coordination normal.     Comments: Speech is clear, able to follow commands CN III-XII intact Normal strength in upper and lower extremities bilaterally including dorsiflexion and plantar flexion, strong and equal grip strength Sensation normal to light and sharp touch Moves extremities without ataxia, coordination intact   Psychiatric:        Mood and Affect: Mood normal.        Behavior: Behavior normal.      ED Treatments / Results  Labs (all labs ordered are listed, but only abnormal results are displayed) Labs Reviewed  TROPONIN I - Abnormal; Notable for the following components:      Result Value   Troponin I 0.03 (*)    All other components within normal limits  COMPREHENSIVE METABOLIC PANEL - Abnormal; Notable for the following components:   Potassium 2.8 (*)    Glucose, Bld 105 (*)    All other components within normal limits  GLUCOSE, CAPILLARY - Abnormal; Notable for the following components:   Glucose-Capillary 102 (*)    All other components within normal limits  BASIC METABOLIC PANEL - Abnormal; Notable for the following components:   Glucose, Bld 115 (*)    All other components within normal limits  GLUCOSE, CAPILLARY - Abnormal; Notable for the following components:   Glucose-Capillary 101 (*)    All other components within normal limits  NOVEL CORONAVIRUS, NAA (HOSPITAL ORDER, SEND-OUT TO REF LAB)  CBC WITH DIFFERENTIAL/PLATELET  BRAIN NATRIURETIC PEPTIDE  MAGNESIUM  GLUCOSE, CAPILLARY    EKG EKG Interpretation  Date/Time:  Sunday  December 10 2018 18:21:01 EDT Ventricular Rate:  105 PR Interval:    QRS Duration: 105 QT Interval:  371 QTC Calculation: 491 R Axis:   -63 Text Interpretation:  Sinus tachycardia LAD, consider left anterior fascicular block Low voltage, precordial leads Borderline T abnormalities, lateral leads Borderline prolonged QT interval No significant change since last tracing Confirmed by Richardean CanalYao, David H [phone]([id number]) on 12/10/2018 7:09:17 PM   Radiology Dg Chest Port 1 View  Result Date: 12/10/2018 CLINICAL DATA:  Chest pain EXAM: PORTABLE CHEST 1 VIEW COMPARISON:  08/28/2018 FINDINGS: Heart is borderline in size. Lungs clear. No effusions or edema. No acute bony abnormality. IMPRESSION: No active disease. Electronically Signed   By: Charlett NoseKevin  Dover M.D.   On: 12/10/2018 19:09    Procedures Procedures (including critical care time)  Medications Ordered in ED Medications  aspirin chewable tablet 244 mg (244 mg Oral Given 12/10/18 2036)     Initial Impression / Assessment and Plan / ED Course  I have reviewed the triage vital signs and the nursing notes.  Pertinent labs & imaging results that were available during my care of the patient were reviewed by me and considered in my medical decision making (see chart for details).  Patient presents for evaluation of  left-sided chest pain radiating to the left arm which was relieved with 3 sublingual nitro tablets just prior to arrival.  History of CAD and MI.  No other episodes of chest pain in the previous days although patient reports her blood pressure has been elevated, hypertensive with systolics in the 160-170s on arrival, minimally tachycardic at 102, all other vitals normal.  Patient is currently chest pain-free, no shortness of breath, lower extremity swelling or pain.  EKG without ischemic changes.  Will check troponin, BNP, basic labs chest x-ray and COVID test.  Given chest pain relieved by nitro, this is concerning for ACS.  Chest x-ray shows no active  cardiopulmonary disease, images reviewed by myself.  No leukocytosis, normal hemoglobin, potassium of 2.8 but no other acute electrolyte derangements, mag within normal limits.  Normal renal and liver function.  Patient given p.o. and IV potassium replacement.  Called by lab for slightly elevated troponin of 0.03, BNP is not elevated.  Patient was given 244 mg of aspirin given that she had 81 mg prior to arrival.  Given slightly elevated troponin will discuss with hospitalist for admission, I have discussed with patient and she does not wish to pursue invasive cardiac catheterization at this time but would like to optimize medical management if troponin continues to elevate.  Case discussed with Dr. Julian ReilGardner with [facility] hospitalist who will see and admit the patient.  Final Clinical Impressions(s) / ED Diagnoses   Final diagnoses:  Chest pain, unspecified type  Elevated troponin    ED Discharge Orders    None       Legrand RamsFord, Aubriel Khanna N, PA-C 12/12/18 1620    Charlynne PanderYao, David Hsienta, MD 12/13/18 [phone]

## 2018-12-10 NOTE — ED Notes (Signed)
ED TO INPATIENT HANDOFF REPORT  ED Nurse Name and Phone #:  [phone]  S Name/Age/Gender Kimberly Bass 85 y.o. female Room/Bed: 024C/024C  Code Status   Code Status: DNR  Home/SNF/Other Nursing Home Patient oriented to: self, place, time and situation Is this baseline? Yes   Triage Complete: Triage complete  Chief Complaint Chest Pain  Triage Note Pt here via [facility] from [facility] for acute onset L sided chest pain that started at 0530.  Given 3 sl nitro that relieved her pain and pt was pain free when ems arrived.  Took 81 mg asa this am.  Pt pain free at this time.  SOB with exertion.   Allergies Allergies  Allergen Reactions  . Penicillins Shortness Of Breath, Nausea Only and Rash    Has patient had a PCN reaction causing immediate rash, facial/tongue/throat swelling, SOB or lightheadedness with hypotension: Yes Has patient had a PCN reaction causing severe rash involving mucus membranes or skin necrosis: Yes Has patient had a PCN reaction that required hospitalization: Yes Has patient had a PCN reaction occurring within the last 10 years: >50 years ago If all of the above answers are "NO", then may proceed with Cephalosporin use.   . Codeine Nausea Only  . Meperidine Hcl Nausea Only  . Morphine Nausea Only    Level of Care/Admitting Diagnosis ED Disposition    ED Disposition Condition [facility] Area: [place] [[id number]]  Level of Care: Telemetry Cardiac [103]  I expect the patient will be discharged within 24 hours: Yes  LOW acuity---Tx typically complete <24 hrs---ACUTE conditions typically can be evaluated <24 hours---LABS likely to return to acceptable levels <24 hours---IS near functional baseline---EXPECTED to return to current living arrangement---NOT newly hypoxic: Meets criteria for 5C-Observation unit  Covid Evaluation: Screening Protocol (No Symptoms)  Diagnosis: Chest pain, rule out acute myocardial infarction  [856314]  Admitting Physician: Etta Quill [[id number]]  Attending Physician: Etta Quill [[id number]]  PT Class (Do Not Modify): Observation [104]  PT Acc Code (Do Not Modify): Observation [10022]       B Medical/Surgery History Past Medical History:  Diagnosis Date  . Arthritis    "mild; in all my joints" (04/27/2017)  . Coronary artery disease   . Cystitis with hematuria 04/26/2017  . Gait disorder   . H/O radioactive iodine thyroid ablation   . Hx of transient ischemic attack (TIA)   . Hyperlipidemia   . Hypertension    "not since MI in 2007" (04/27/2017)  . Hyperthyroidism    H/O radioactive iodine thyroid ablation [Z92.3]  . Hypothyroidism   . Lower extremity myoclonus   . Lumbosacral spondylosis   . Metabolic encephalopathy   . Migraine    "I've had one in my lifetime" (04/27/2017)  . Mild obesity   . Myocardial infarction ([place]) 12/2005  . Myoclonus    Lower extremity involvement  . Obesity   . Pneumonia    "lots when I was a small child; not at all since" (04/27/2017)  . Rhabdomyolysis   . Seizures ([place])    History is questionable  . Stroke ([place])   . Thyroid disease    Past Surgical History:  Procedure Laterality Date  . ABDOMINAL HYSTERECTOMY  1972   for uterine fibroids/notes 11/10/2010  . ANTERIOR CERVICAL DECOMP/DISCECTOMY FUSION  03/2004   Archie Endo 11/10/2010  . APPENDECTOMY    . BACK SURGERY    . BREAST BIOPSY Right 1996   nodule resected/notes 11/10/2010  .  CARDIAC CATHETERIZATION  01/03/2006  . CATARACT EXTRACTION W/ INTRAOCULAR LENS  IMPLANT, BILATERAL Bilateral   . COLONOSCOPY  11/2003   [name]/notes 11/10/2010  . ESOPHAGOGASTRODUODENOSCOPY  09/2006   [name]/notes 11/10/2010  . LUMBAR DISC SURGERY  1990s X 1; 05/2003   "a nerve was pinching"; [name]/notes 11/10/2010  . OVARY SURGERY Left 1999   benign tumor resected /notes 11/10/2010  . TONSILLECTOMY  ~ 1947     A IV Location/Drains/Wounds Patient Lines/Drains/Airways Status   Active Line/Drains/Airways     Name:   Placement date:   Placement time:   Site:   Days:   Peripheral IV 12/10/18 Right Antecubital   12/10/18    1900    Antecubital   less than 1   External Urinary Catheter   08/31/18    1900    -   101          Intake/Output Last 24 hours No intake or output data in the 24 hours ending 12/10/18 2243  Labs/Imaging Results for orders placed or performed during the hospital encounter of 12/10/18 (from the past 48 hour(s))  CBC with Differential     Status: None   Collection Time: 12/10/18  7:57 PM  Result Value Ref Range   WBC 6.7 4.0 - 10.5 K/uL   RBC 4.93 3.87 - 5.11 MIL/uL   Hemoglobin 14.1 12.0 - 15.0 g/dL   HCT 16.142.9 09.636.0 - 04.546.0 %   MCV 87.0 80.0 - 100.0 fL   MCH 28.6 26.0 - 34.0 pg   MCHC 32.9 30.0 - 36.0 g/dL   RDW 40.913.9 81.111.5 - 91.415.5 %   Platelets 196 150 - 400 K/uL   nRBC 0.0 0.0 - 0.2 %   Neutrophils Relative % 69 %   Neutro Abs 4.6 1.7 - 7.7 K/uL   Lymphocytes Relative 22 %   Lymphs Abs 1.5 0.7 - 4.0 K/uL   Monocytes Relative 7 %   Monocytes Absolute 0.5 0.1 - 1.0 K/uL   Eosinophils Relative 1 %   Eosinophils Absolute 0.1 0.0 - 0.5 K/uL   Basophils Relative 1 %   Basophils Absolute 0.0 0.0 - 0.1 K/uL   Immature Granulocytes 0 %   Abs Immature Granulocytes 0.01 0.00 - 0.07 K/uL    Comment: Performed at [facility], [address] [place], [place] [address]  Brain natriuretic peptide     Status: None   Collection Time: 12/10/18  7:57 PM  Result Value Ref Range   B Natriuretic Peptide 54.9 0.0 - 100.0 pg/mL    Comment: Performed at [facility], [address] [place], [place] [address]  Troponin I - Once     Status: Abnormal   Collection Time: 12/10/18  7:57 PM  Result Value Ref Range   Troponin I 0.03 (HH) <0.03 ng/mL    Comment: CRITICAL RESULT CALLED TO, READ BACK BY AND VERIFIED WITH: Junaid Wurzer,M RN 12/10/2018 2201 [address]ANS Performed at [facility], [address] [place], [place] [address]   Comprehensive metabolic panel     Status:  Abnormal   Collection Time: 12/10/18  7:57 PM  Result Value Ref Range   Sodium 144 135 - 145 mmol/L   Potassium 2.8 (L) 3.5 - 5.1 mmol/L   Chloride 103 98 - 111 mmol/L   CO2 29 22 - 32 mmol/L   Glucose, Bld 105 (H) 70 - 99 mg/dL   BUN 9 8 - 23 mg/dL   Creatinine, Ser 4.690.71 0.44 - 1.00  mg/dL   Calcium 9.6 8.9 - 60.410.3 mg/dL   Total Protein 6.9 6.5 - 8.1 g/dL   Albumin 3.9 3.5 - 5.0 g/dL   AST 24 15 - 41 U/L   ALT 18 0 - 44 U/L   Alkaline Phosphatase 43 38 - 126 U/L   Total Bilirubin 0.6 0.3 - 1.2 mg/dL   GFR calc non Af Amer >60 >60 mL/min   GFR calc Af Amer >60 >60 mL/min   Anion gap 12 5 - 15    Comment: Performed at [facility], [address] [place], [place] [address]  Magnesium     Status: None   Collection Time: 12/10/18  7:57 PM  Result Value Ref Range   Magnesium 1.7 1.7 - 2.4 mg/dL    Comment: Performed at [facility], [address] [place], [place] [address]   Dg Chest Port 1 View  Result Date: 12/10/2018 CLINICAL DATA:  Chest pain EXAM: PORTABLE CHEST 1 VIEW COMPARISON:  08/28/2018 FINDINGS: Heart is borderline in size. Lungs clear. No effusions or edema. No acute bony abnormality. IMPRESSION: No active disease. Electronically Signed   By: Charlett NoseKevin  Dover M.D.   On: 12/10/2018 19:09    Pending Labs Unresulted Labs (From admission, onward)    Start     Ordered   12/10/18 2230  Troponin I - Now Then Q3H  Now then every 3 hours,   TIMED (with STAT occurrences)     12/10/18 2229   12/10/18 1911  Novel Coronavirus,NAA,(SEND-OUT TO REF LAB - TAT 24-48 hrs); Hosp Order  (Asymptomatic Patients Labs)  Once,   STAT    Question:  Rule Out  Answer:  Yes   12/10/18 1910          Vitals/Pain Today's Vitals   12/10/18 2130 12/10/18 2145 12/10/18 2200 12/10/18 2215  BP: (!) 161/73 (!) 165/87 (!) 156/87 (!) 171/93  Pulse: 98 95 92 99  Resp: (!) 24 (!) 26 19 17   Temp:      TempSrc:      SpO2: 95% 92% 95% 96%  Weight:      Height:      PainSc:         Isolation Precautions No active isolations  Medications Medications  potassium chloride 10 mEq in 100 mL IVPB (10 mEq Intravenous New Bag/Given 12/10/18 2237)  clopidogrel (PLAVIX) tablet 75 mg (has no administration in time range)  amLODipine (NORVASC) tablet 5 mg (has no administration in time range)  aspirin EC tablet 81 mg (has no administration in time range)  calcium-vitamin D (OSCAL-500) 500-400 MG-UNIT per tablet 1 tablet (has no administration in time range)  acetaminophen (TYLENOL) CR tablet 1,300 mg (has no administration in time range)  Vitamin B-12 SUBL 2,500 mcg (has no administration in time range)  gabapentin (NEURONTIN) capsule 400 mg (has no administration in time range)  ipratropium-albuterol (DUONEB) 0.5-2.5 (3) MG/3ML nebulizer solution 3 mL (has no administration in time range)  levothyroxine (SYNTHROID) tablet 100 mcg (has no administration in time range)  mirabegron ER (MYRBETRIQ) tablet 50 mg (has no administration in time range)  metoprolol tartrate (LOPRESSOR) tablet 12.5 mg (has no administration in time range)  multivitamin with minerals tablet 1 tablet (has no administration in time range)  pantoprazole (PROTONIX) EC tablet 20 mg (has no administration in time range)  simvastatin (ZOCOR) tablet 20 mg (has no administration in time range)  nitroGLYCERIN (NITROSTAT) SL tablet 0.4 mg (has no administration in time range)  acetaminophen (TYLENOL) tablet 650 mg (has no administration in time range)  ondansetron (ZOFRAN) injection 4 mg (has no administration in time range)  enoxaparin (LOVENOX) injection 40 mg (has no administration in time range)  aspirin chewable tablet 244 mg (244 mg Oral Given 12/10/18 2036)  potassium chloride SA (K-DUR) CR tablet 40 mEq (40 mEq Oral Given 12/10/18 2235)    Mobility walks with device     Focused Assessments Cardiac Assessment Handoff:  Cardiac Rhythm: Sinus tachycardia Lab Results  Component Value Date   CKTOTAL  364 (H) 08/30/2018   TROPONINI 0.03 (HH) 12/10/2018   No results found for: DDIMER Does the Patient currently have chest pain? No     R Recommendations: See Admitting Provider Note  Report given to:   Additional Notes:  Pt not reporting chest pain since arrival. Relieved by nitro before arrival

## 2018-12-10 NOTE — H&P (Signed)
History and Physical    Kimberly BrownieJeanneane H Hemmer ZOX:[id number] DOB: 06/22/1926 DOA: 12/10/2018  PCP: Merri BrunettePharr, Walter, MD  Patient coming from: SNF  I have personally briefly reviewed patient's old medical [facility]  Chief Complaint: CP  HPI: Kimberly Bass is a 85 y.o. female with medical history significant of HTN, stroke, CAD last LHC in 2007.  Patient presents to the ED with c/o CP.  Pain located in L chest.  Onset 530pm.  Given 3 SL NTG (which she never has to take but has script for just in case) this relieved pain and she remains pain free since then.  Does have SOB with exertion.   ED Course: BP running high in ED (nearly 180 systolic) but pt remains CP free.  Trop 0.03, BNP 54, CXR clear.  EKG is sinus at 105, has left axis deviation, probably an incomplete left bundle, this is similar to priors.   Review of Systems: As per HPI otherwise 10 point review of systems negative.   Past Medical History:  Diagnosis Date   Arthritis    "mild; in all my joints" (04/27/2017)   Coronary artery disease    Cystitis with hematuria 04/26/2017   Gait disorder    H/O radioactive iodine thyroid ablation    Hx of transient ischemic attack (TIA)    Hyperlipidemia    Hypertension    "not since MI in 2007" (04/27/2017)   Hyperthyroidism    H/O radioactive iodine thyroid ablation [Z92.3]   Hypothyroidism    Lower extremity myoclonus    Lumbosacral spondylosis    Metabolic encephalopathy    Migraine    "I've had one in my lifetime" (04/27/2017)   Mild obesity    Myocardial infarction ([facility]) 12/2005   Myoclonus    Lower extremity involvement   Obesity    Pneumonia    "lots when I was a small child; not at all since" (04/27/2017)   Rhabdomyolysis    Seizures ([facility])    History is questionable   Stroke [facility])    Thyroid disease     Past Surgical History:  Procedure Laterality Date   ABDOMINAL HYSTERECTOMY  1972   for uterine  fibroids/notes 11/10/2010   ANTERIOR CERVICAL DECOMP/DISCECTOMY FUSION  03/2004   [name]/notes 11/10/2010   APPENDECTOMY     BACK SURGERY     BREAST BIOPSY Right 1996   nodule resected/notes 11/10/2010   CARDIAC CATHETERIZATION  01/03/2006   CATARACT EXTRACTION W/ INTRAOCULAR LENS  IMPLANT, BILATERAL Bilateral    COLONOSCOPY  11/2003   [name]/notes 11/10/2010   ESOPHAGOGASTRODUODENOSCOPY  09/2006   [name]/notes 11/10/2010   LUMBAR DISC SURGERY  1990s X 1; 05/2003   "a nerve was pinching"; /notes 11/10/2010   OVARY SURGERY Left 1999   benign tumor resected /notes 11/10/2010   TONSILLECTOMY  ~ 1947     reports that she has never smoked. She has never used smokeless tobacco. She reports that she does not drink alcohol or use drugs.  Allergies  Allergen Reactions   Penicillins Shortness Of Breath, Nausea Only and Rash    Has patient had a PCN reaction causing immediate rash, facial/tongue/throat swelling, SOB or lightheadedness with hypotension: Yes Has patient had a PCN reaction causing severe rash involving mucus membranes or skin necrosis: Yes Has patient had a PCN reaction that required hospitalization: Yes Has patient had a PCN reaction occurring within the last 10 years: >50 years ago If all of the above answers are "NO", then may proceed  with Cephalosporin use.    Codeine Nausea Only   Meperidine Hcl Nausea Only   Morphine Nausea Only    Family History  Problem Relation Age of Onset   Heart disease Father    Heart attack Mother    Stroke Sister    Heart attack Sister    Heart attack Brother        Possible   Diabetes Sister      Prior to Admission medications   Medication Sig Start Date End Date Taking? Authorizing Provider  acetaminophen (TYLENOL ARTHRITIS PAIN) 650 MG CR tablet Take 1,300 mg by mouth 2 (two) times a day.    Yes [provider]  amLODipine (NORVASC) 5 MG tablet Take 5 mg by mouth daily. 12/07/18  Yes [provider]  aspirin EC 81  MG tablet Take 81 mg by mouth daily.    Yes [provider]  calcium-vitamin D (CALCIUM 500+D HIGH POTENCY) 500-400 MG-UNIT tablet Take 1 tablet by mouth daily.    Yes [provider]  clopidogrel (PLAVIX) 75 MG tablet TAKE 1 TABLET BY MOUTH  DAILY Patient taking differently: Take 75 mg by mouth daily.  07/04/18  Yes Nahser, Wonda Cheng, MD  Cyanocobalamin (VITAMIN B-12) 2500 MCG SUBL Place 2,500 mcg under the tongue daily.   Yes [provider]  Ensure (ENSURE) Take 237 mLs by mouth 2 (two) times daily as needed (for supplementation).   Yes [provider]  gabapentin (NEURONTIN) 400 MG capsule TAKE 1 CAPSULE (400 MG TOTAL) BY MOUTH 4 (FOUR) TIMES DAILY. 07/10/18  Yes Ward Givens, NP  levothyroxine (SYNTHROID, LEVOTHROID) 100 MCG tablet Take 100 mcg by mouth daily before breakfast.   Yes [provider]  metoprolol tartrate (LOPRESSOR) 25 MG tablet Take 12.5 mg by mouth 2 (two) times daily.   Yes [provider]  mirabegron ER (MYRBETRIQ) 50 MG TB24 tablet Take 50 mg by mouth daily.   Yes [provider]  Multiple Vitamin (MULTIVITAMIN WITH MINERALS) TABS tablet Take 1 tablet by mouth daily.   Yes [provider]  NITROSTAT 0.4 MG SL tablet DISSOLVE 1 TAB UNDER THE  TONGUE EVERY 5 MINUTES AS  NEEDED FOR CHEST PAIN. MAX  3 TOTAL DOSES/15MIN. CALL  911 IF NO RELIEF Patient taking differently: Place 0.4 mg under the tongue every 5 (five) minutes x 3 doses as needed for chest pain (AND CALL 9-1-1-, IF NO RELIEF).  09/11/18  Yes Nahser, Wonda Cheng, MD  pantoprazole (PROTONIX) 20 MG tablet Take 20 mg by mouth daily before breakfast. DO NOT CRUSH   Yes [provider]  simvastatin (ZOCOR) 20 MG tablet Take 20 mg by mouth at bedtime.    Yes [provider]  ipratropium-albuterol (DUONEB) 0.5-2.5 (3) MG/3ML SOLN Take 3 mLs by nebulization. INHALE 1 VIAL VIA NEBULIZER EVERY 6 HOURS AS NEEDED FOR WHEEZING    [provider]  magnesium hydroxide (MILK OF MAGNESIA) 400 MG/5ML suspension Constipation (1 of 4): If no BM in 3 days, give 30 cc Milk of Magnesium p.o. x 1 dose in 24 hours as needed (Do not use standing constipation orders for residents with renal failure CFR less than 30. Contact MD for orders)    [provider]  NON FORMULARY Mighty Shake (chocolate if available) Drink 1 shake by mouth two times a day    [provider]    Physical Exam: Vitals:   12/10/18 2130 12/10/18 2145 12/10/18 2200 12/10/18 2215  BP: Marland Kitchen)  161/73 (!) 165/87 (!) 156/87 (!) 171/93  Pulse: 98 95 92 99  Resp: (!) 24 (!) 26 19 17   Temp:      TempSrc:      SpO2: 95% 92% 95% 96%  Weight:      Height:        Constitutional: NAD, calm, comfortable Eyes: PERRL, lids and conjunctivae normal ENMT: Mucous membranes are moist. Posterior pharynx clear of any exudate or lesions.Normal dentition.  Neck: normal, supple, no masses, no thyromegaly Respiratory: clear to auscultation bilaterally, no wheezing, no crackles. Normal respiratory effort. No accessory muscle use.  Cardiovascular: Regular rate and rhythm, no murmurs / rubs / gallops. No extremity edema. 2+ pedal pulses. No carotid bruits.  Abdomen: no tenderness, no masses palpated. No hepatosplenomegaly. Bowel sounds positive.  Musculoskeletal: no clubbing / cyanosis. No joint deformity upper and lower extremities. Good ROM, no contractures. Normal muscle tone.  Skin: no rashes, lesions, ulcers. No induration Neurologic: CN 2-12 grossly intact. Sensation intact, DTR normal. Strength 5/5 in all 4.  Psychiatric: Normal judgment and insight. Alert and oriented x 3. Normal mood.    Labs on Admission: I have personally reviewed following labs and imaging studies  CBC: Recent Labs  Lab 12/10/18 1957  WBC 6.7  NEUTROABS 4.6  HGB 14.1  HCT 42.9  MCV 87.0  PLT 196   Basic Metabolic Panel: Recent Labs  Lab 12/10/18 1957  NA 144  K 2.8*  CL 103   CO2 29  GLUCOSE 105*  BUN 9  CREATININE 0.71  CALCIUM 9.6  MG 1.7   GFR: Estimated Creatinine Clearance: 38.1 mL/min (by C-G formula based on SCr of 0.71 mg/dL). Liver Function Tests: Recent Labs  Lab 12/10/18 1957  AST 24  ALT 18  ALKPHOS 43  BILITOT 0.6  PROT 6.9  ALBUMIN 3.9   No results for input(s): LIPASE, AMYLASE in the last 168 hours. No results for input(s): AMMONIA in the last 168 hours. Coagulation Profile: No results for input(s): INR, PROTIME in the last 168 hours. Cardiac Enzymes: Recent Labs  Lab 12/10/18 1957  TROPONINI 0.03*   BNP (last 3 results) No results for input(s): PROBNP in the last 8760 hours. HbA1C: No results for input(s): HGBA1C in the last 72 hours. CBG: No results for input(s): GLUCAP in the last 168 hours. Lipid Profile: No results for input(s): CHOL, HDL, LDLCALC, TRIG, CHOLHDL, LDLDIRECT in the last 72 hours. Thyroid Function Tests: No results for input(s): TSH, T4TOTAL, FREET4, T3FREE, THYROIDAB in the last 72 hours. Anemia Panel: No results for input(s): VITAMINB12, FOLATE, FERRITIN, TIBC, IRON, RETICCTPCT in the last 72 hours. Urine analysis:    Component Value Date/Time   COLORURINE YELLOW 08/01/2018 1438   APPEARANCEUR CLOUDY (A) 08/01/2018 1438   LABSPEC 1.013 08/01/2018 1438   PHURINE 8.0 08/01/2018 1438   GLUCOSEU NEGATIVE 08/01/2018 1438   HGBUR NEGATIVE 08/01/2018 1438   BILIRUBINUR NEGATIVE 08/01/2018 1438   KETONESUR NEGATIVE 08/01/2018 1438   PROTEINUR NEGATIVE 08/01/2018 1438   NITRITE NEGATIVE 08/01/2018 1438   LEUKOCYTESUR TRACE (A) 08/01/2018 1438    Radiological Exams on Admission: Dg Chest Port 1 View  Result Date: 12/10/2018 CLINICAL DATA:  Chest pain EXAM: PORTABLE CHEST 1 VIEW COMPARISON:  08/28/2018 FINDINGS: Heart is borderline in size. Lungs clear. No effusions or edema. No acute bony abnormality. IMPRESSION: No active disease. Electronically Signed   By: Charlett NoseKevin  Dover M.D.   On: 12/10/2018 19:09      EKG: Independently reviewed.  Assessment/Plan Principal  Problem:   Chest pain, rule out acute myocardial infarction Active Problems:   Essential hypertension    1. CP r/o - 1. CP obs pathway 2. Serial trops 3. Tele monitor 4. NPO after MN 5. Cards eval in AM 2. HTN - 1. Continue home BP meds 2. Give evening BB dose now 3. Adding PRN hydralazine IV 3. Hypokalemia - Replace K  DVT prophylaxis: Lovenox Code Status: DNR Family Communication: No family in room Disposition Plan: SNF after admit Consults called: Message sent to P. Trent for routine cards eval in AM Admission status: [place]    Sreshta Cressler, [place] M. DO [facility]  How to contact the [facility] Attending or Consulting provider 7A - 7P or covering provider during after hours 7P -7A, for this patient?  1. Check the care team in [facility] and look for a) attending/consulting [facility] provider listed and b) the [facility] team listed 2. Log into [email or web address]  Amion Physician Scheduling and messaging for groups and whole hospitals  On call and physician scheduling software for group practices, residents, hospitalists and other medical providers for call, clinic, rotation and shift schedules. OnCall [employer] is a hospital-wide system for scheduling doctors and paging doctors on call. EasyPlot is for scientific plotting and data analysis.  [email or web address]  and use [place]'s universal password to access. If you do not have the password, please contact the hospital operator.  3. Locate the [facility] provider you are looking for under [facility] and page to a number that you can be directly reached. 4. If you still have difficulty reaching the provider, please page the [facility] (Director on Call) for the Hospitalists listed on amion for assistance.  12/10/2018, 10:52 PM

## 2018-12-10 NOTE — ED Triage Notes (Signed)
Pt here via [facility] from [facility] for acute onset L sided chest pain that started at 0530.  Given 3 sl nitro that relieved her pain and pt was pain free when ems arrived.  Took 81 mg asa this am.  Pt pain free at this time.  SOB with exertion.

## 2018-12-10 NOTE — ED Notes (Signed)
RN updated nurse from [facility].

## 2018-12-11 DIAGNOSIS — I10 Essential (primary) hypertension: Secondary | ICD-10-CM | POA: Diagnosis not present

## 2018-12-11 DIAGNOSIS — R079 Chest pain, unspecified: Secondary | ICD-10-CM | POA: Diagnosis not present

## 2018-12-11 LAB — BASIC METABOLIC PANEL
Anion gap: 12 (ref 5–15)
BUN: 8 mg/dL (ref 8–23)
CO2: 25 mmol/L (ref 22–32)
Calcium: 9 mg/dL (ref 8.9–10.3)
Chloride: 108 mmol/L (ref 98–111)
Creatinine, Ser: 0.57 mg/dL (ref 0.44–1.00)
GFR calc Af Amer: >60 mL/min (ref >60)
GFR calc non Af Amer: >60 mL/min (ref >60)
Glucose, Bld: 99 mg/dL (ref 70–99)
Potassium: 2.7 mmol/L — CL (ref 3.5–5.1)
Sodium: 145 mmol/L (ref 135–145)

## 2018-12-11 LAB — TROPONIN I
Troponin I: 0.04 ng/mL (ref <0.03)
Troponin I: 0.04 ng/mL (ref <0.03)
Troponin I: 0.04 ng/mL (ref <0.03)

## 2018-12-11 LAB — NOVEL CORONAVIRUS, NAA (HOSP ORDER, SEND-OUT TO REF LAB; TAT 18-24 HRS): SARS-CoV-2, NAA: NOT DETECTED

## 2018-12-11 LAB — GLUCOSE, CAPILLARY
Glucose-Capillary: 88 mg/dL (ref 70–99)
Glucose-Capillary: 102 mg/dL — ABNORMAL HIGH (ref 70–99)
Glucose-Capillary: 101 mg/dL — ABNORMAL HIGH (ref 70–99)

## 2018-12-11 MED ORDER — MAGNESIUM SULFATE 2 GM/50ML IV SOLN
2.0000 g | Freq: Once | INTRAVENOUS | Status: AC
  Administered 2018-12-11: 2 g via INTRAVENOUS
  Filled 2018-12-11: qty 50

## 2018-12-11 MED ORDER — POTASSIUM CHLORIDE CRYS ER 20 MEQ PO TBCR
40.0000 meq | EXTENDED_RELEASE_TABLET | ORAL | Status: AC
  Administered 2018-12-11 (×3): 40 meq via ORAL
  Filled 2018-12-11 (×3): qty 2

## 2018-12-11 MED ORDER — METOPROLOL TARTRATE 25 MG PO TABS
25.0000 mg | ORAL_TABLET | Freq: Two times a day (BID) | ORAL | Status: DC
  Administered 2018-12-11 – 2018-12-12 (×2): 25 mg via ORAL
  Filled 2018-12-11 (×2): qty 1

## 2018-12-11 MED ORDER — ISOSORBIDE MONONITRATE ER 30 MG PO TB24
15.0000 mg | ORAL_TABLET | Freq: Every day | ORAL | Status: DC
  Administered 2018-12-11 – 2018-12-12 (×2): 15 mg via ORAL
  Filled 2018-12-11 (×2): qty 1

## 2018-12-11 NOTE — Progress Notes (Signed)
I bag ok Krun 10 Meq IV which was started at ED was completed on the floor. Blood works at [employer]. [employer]

## 2018-12-11 NOTE — Consult Note (Addendum)
Cardiology Consultation:   Patient ID: LARIZA COTHRON; [id number]; 1925-12-14   Admit date: 12/10/2018 Date of Consult: 12/11/2018  Primary Care Provider: Deland Pretty, MD Primary Cardiologist: Dr. Grayland Jack, MD   Patient Profile:   Kimberly Bass is a 85 y.o. female with a hx of CAD, HTN, CVA and HLD who is being seen today for the evaluation of chest pain at the request of Dr. Tawanna Solo.   History of Present Illness:   Kimberly Bass is a 85yo F with a hx as stated above who presented to [facility] on 12/11/2018 with left sided bra line chest pain which was responsive SL NTG. She reports that the pain began about yesterday evening while sitting in her room at [place]. She reported her symptoms to the RN and was given SL NTG with relief. It was recommended that she reports to the ED for further evaluation given her hx of CAD. She denies SOB, palpitations, fever, cough, diaphoresis, N/V, orthopnea, LE swelling or syncope. She states that over the last several weeks her BP has been more elevated with a SBP in the 200 when taken at her facility. Over this time, her amlodipine has been added back to her regimen after being decreased secondary to hypotension.   In the ED, BP was noted to be elevated with a SBP in the 180 on presentation. Troponin level found to be elevated at 0.03. BNP was 54. CXR with no acute cardiopulmomary process. EKG with ST with HR 105 and LBBB.   She underwent a cath in 12/2005 which showed a very tight stenosis in the terminal aspect of a large posterior lateral branch. The stenosis was tortuous and was noted to be diffusely diseased, not amenable for PCI.   She was last seen by Dr. Acie Fredrickson on 05/08/2018 and was doing well without symptoms of angina, palpitations, SOB, dizziness or syncope. She was able to perform all of her normal activities without limitations. She used to experience remote angina but had not noticed in several years. Her BP  was noted to be relatively low at 106/50, so her amlodipine was decreased to 2.5 mg per day with plans to have close follow up for re-assessment.  I last saw her in f/u on 06/07/2018 for follow up of episodes of hypotension. She was doing well from a cardiac perspective. Her BP was improved from last visit to 132/78. She denies dizziness, presyncopal or syncopal episodes. In reviewing her medication list, she reported that she had not been taking the Imdur for at least one year.    Past Medical History:  Diagnosis Date  . Arthritis    "mild; in all my joints" (04/27/2017)  . Coronary artery disease   . Cystitis with hematuria 04/26/2017  . Gait disorder   . H/O radioactive iodine thyroid ablation   . Hx of transient ischemic attack (TIA)   . Hyperlipidemia   . Hypertension    "not since MI in 2007" (04/27/2017)  . Hyperthyroidism    H/O radioactive iodine thyroid ablation [Z92.3]  . Hypothyroidism   . Lower extremity myoclonus   . Lumbosacral spondylosis   . Metabolic encephalopathy   . Migraine    "I've had one in my lifetime" (04/27/2017)  . Mild obesity   . Myocardial infarction ([place]) 12/2005  . Myoclonus    Lower extremity involvement  . Obesity   . Pneumonia    "lots when I was a small child; not at all since" (04/27/2017)  .  Rhabdomyolysis   . Seizures (HCC)    History is questionable  . Stroke (HCC)   . Thyroid disease     Past Surgical History:  Procedure Laterality Date  . ABDOMINAL HYSTERECTOMY  1972   for uterine fibroids/notes 11/10/2010  . ANTERIOR CERVICAL DECOMP/DISCECTOMY FUSION  03/2004   [name]/notes 11/10/2010  . APPENDECTOMY    . BACK SURGERY    . BREAST BIOPSY Right 1996   nodule resected/notes 11/10/2010  . CARDIAC CATHETERIZATION  01/03/2006  . CATARACT EXTRACTION W/ INTRAOCULAR LENS  IMPLANT, BILATERAL Bilateral   . COLONOSCOPY  11/2003   [name]/notes 11/10/2010  . ESOPHAGOGASTRODUODENOSCOPY  09/2006   [name]/notes 11/10/2010  . LUMBAR DISC SURGERY  1990s X 1;  05/2003   "a nerve was pinching"; [name]/notes 11/10/2010  . OVARY SURGERY Left 1999   benign tumor resected /notes 11/10/2010  . TONSILLECTOMY  ~ 1947     Prior to Admission medications   Medication Sig Start Date End Date Taking? Authorizing Provider  acetaminophen (TYLENOL ARTHRITIS PAIN) 650 MG CR tablet Take 1,300 mg by mouth 2 (two) times a day.    Yes [provider]  amLODipine (NORVASC) 5 MG tablet Take 5 mg by mouth daily. 12/07/18  Yes [provider]  aspirin EC 81 MG tablet Take 81 mg by mouth daily.    Yes [provider]  calcium-vitamin D (CALCIUM 500+D HIGH POTENCY) 500-400 MG-UNIT tablet Take 1 tablet by mouth daily.    Yes [provider]  clopidogrel (PLAVIX) 75 MG tablet TAKE 1 TABLET BY MOUTH  DAILY Patient taking differently: Take 75 mg by mouth daily.  07/04/18  Yes Nahser, Deloris PingPhilip J, MD  Cyanocobalamin (VITAMIN B-12) 2500 MCG SUBL Place 2,500 mcg under the tongue daily.   Yes [provider]  Ensure (ENSURE) Take 237 mLs by mouth 2 (two) times daily as needed (for supplementation).   Yes [provider]  gabapentin (NEURONTIN) 400 MG capsule TAKE 1 CAPSULE (400 MG TOTAL) BY MOUTH 4 (FOUR) TIMES DAILY. 07/10/18  Yes Butch PennyMillikan, Megan, NP  levothyroxine (SYNTHROID, LEVOTHROID) 100 MCG tablet Take 100 mcg by mouth daily before breakfast.   Yes [provider]  metoprolol tartrate (LOPRESSOR) 25 MG tablet Take 12.5 mg by mouth 2 (two) times daily.   Yes [provider]  mirabegron ER (MYRBETRIQ) 50 MG TB24 tablet Take 50 mg by mouth daily.   Yes [provider]  Multiple Vitamin (MULTIVITAMIN WITH MINERALS) TABS tablet Take 1 tablet by mouth daily.   Yes [provider]  NITROSTAT 0.4 MG SL tablet DISSOLVE 1 TAB UNDER THE  TONGUE EVERY 5 MINUTES AS  NEEDED FOR CHEST PAIN. MAX  3 TOTAL DOSES/15MIN. CALL  911 IF NO RELIEF Patient taking differently: Place 0.4 mg under the tongue every 5 (five) minutes  x 3 doses as needed for chest pain (AND CALL 9-1-1-, IF NO RELIEF).  09/11/18  Yes Nahser, Deloris PingPhilip J, MD  pantoprazole (PROTONIX) 20 MG tablet Take 20 mg by mouth daily before breakfast. DO NOT CRUSH   Yes [provider]  simvastatin (ZOCOR) 20 MG tablet Take 20 mg by mouth at bedtime.    Yes [provider]  ipratropium-albuterol (DUONEB) 0.5-2.5 (3) MG/3ML SOLN Take 3 mLs by nebulization. INHALE 1 VIAL VIA NEBULIZER EVERY 6 HOURS AS NEEDED FOR WHEEZING    [provider]  magnesium hydroxide (MILK OF MAGNESIA) 400 MG/5ML suspension Constipation (1 of 4): If no BM in 3 days, give 30 cc  Milk of Magnesium p.o. x 1 dose in 24 hours as needed (Do not use standing constipation orders for residents with renal failure CFR less than 30. Contact MD for orders)    [provider]  NON FORMULARY Mighty Shake (chocolate if available) Drink 1 shake by mouth two times a day    [provider]    Inpatient Medications: Scheduled Meds: . amLODipine  5 mg Oral Daily  . aspirin EC  81 mg Oral Daily  . calcium-vitamin D  1 tablet Oral Daily  . clopidogrel  75 mg Oral Daily  . enoxaparin (LOVENOX) injection  40 mg Subcutaneous Q24H  . gabapentin  400 mg Oral QID  . levothyroxine  100 mcg Oral Q0600  . metoprolol tartrate  12.5 mg Oral BID  . mirabegron ER  50 mg Oral Daily  . multivitamin with minerals  1 tablet Oral Daily  . pantoprazole  20 mg Oral QAC breakfast  . potassium chloride  40 mEq Oral Q3H  . simvastatin  20 mg Oral QHS  . vitamin B-12  2,500 mcg Oral Daily   Continuous Infusions:  PRN Meds: acetaminophen, hydrALAZINE, ipratropium-albuterol, nitroGLYCERIN, ondansetron (ZOFRAN) IV  Allergies:    Allergies  Allergen Reactions  . Penicillins Shortness Of Breath, Nausea Only and Rash    Has patient had a PCN reaction causing immediate rash, facial/tongue/throat swelling, SOB or lightheadedness with hypotension: Yes Has patient had a PCN reaction  causing severe rash involving mucus membranes or skin necrosis: Yes Has patient had a PCN reaction that required hospitalization: Yes Has patient had a PCN reaction occurring within the last 10 years: >50 years ago If all of the above answers are "NO", then may proceed with Cephalosporin use.   . Codeine Nausea Only  . Meperidine Hcl Nausea Only  . Morphine Nausea Only    Social History:   Social History   Socioeconomic History  . Marital status: Widowed    Spouse name: Not on file  . Number of children: 1  . Years of education: HS  . Highest education level: Not on file  Occupational History  . Occupation: retired    Associate Professormployer: RETIRED  Social Needs  . Financial resource strain: Not on file  . Food insecurity    Worry: Not on file    Inability: Not on file  . Transportation needs    Medical: Not on file    Non-medical: Not on file  Tobacco Use  . Smoking status: Never Smoker  . Smokeless tobacco: Never Used  Substance and Sexual Activity  . Alcohol use: No  . Drug use: No  . Sexual activity: Not Currently  Lifestyle  . Physical activity    Days per week: Not on file    Minutes per session: Not on file  . Stress: Not on file  Relationships  . Social Musicianconnections    Talks on phone: Not on file    Gets together: Not on file    Attends religious service: Not on file    Active member of club or organization: Not on file    Attends meetings of clubs or organizations: Not on file    Relationship status: Not on file  . Intimate partner violence    Fear of current or ex partner: Not on file    Emotionally abused: Not on file    Physically abused: Not on file    Forced sexual activity: Not on file  Other Topics Concern  . Not on  file  Social History Narrative   Lives at [facility]   Patient is left handed, but has had to adapt to using her right hand lately.   Patient drinks 1/2 cup of caffeine each morning.    Family History:   Family  History  Problem Relation Age of Onset  . Heart disease Father   . Heart attack Mother   . Stroke Sister   . Heart attack Sister   . Heart attack Brother        Possible  . Diabetes Sister    Family Status:  Family Status  Relation Name Status  . Father  Deceased at age 75  . Mother  Deceased at age 71  . Sister twin Alive  . Brother  Deceased  . Sister  Deceased  . Brother  Deceased       Intracranial hemorrhage  . MGM  Deceased  . MGF  Deceased  . PGM  Deceased  . PGF  Deceased    ROS:  Please see the history of present illness.  All other ROS reviewed and negative.     Physical Exam/Data:   Vitals:   12/11/18 0554 12/11/18 0943 12/11/18 0951 12/11/18 1117  BP: (!) 141/61 (!) 148/89  (!) 150/73  Pulse: 81 94  82  Resp: Temp: 98.4 F (36.9 C) 98.2 F (36.8 C)  98.6 F (37 C)  TempSrc: Oral Oral  Oral  SpO2: 91% 92%  93%  Weight:   65.5 kg   Height:        Intake/Output Summary (Last 24 hours) at 12/11/2018 1413 Last data filed at 12/11/2018 1259 Gross per 24 hour  Intake 337 ml  Output 900 ml  Net -563 ml   Filed Weights   12/10/18 1821 12/10/18 2341 12/11/18 0951  Weight: 67 kg 65.8 kg 65.5 kg   Body mass index is 29.17 kg/m.   General: Elderly, NAD Skin: Warm, dry, intact  Head: Normocephalic, atraumatic, clear, moist mucus membranes. Neck: Negative for carotid bruits. No JVD Lungs:Clear to ausculation bilaterally. No wheezes, rales, or rhonchi. Breathing is unlabored. Cardiovascular: RRR with S1 S2. No murmurs, rubs, gallops, or LV heave appreciated. Abdomen: Soft, non-tender, non-distended. No obvious abdominal masses. Extremities: No edema. No clubbing or cyanosis. DP/PT pulses 1+ bilaterally Neuro: Alert and oriented. No focal deficits. No facial asymmetry. MAE spontaneously. Psych: Responds to questions appropriately with normal affect.     EKG:  The EKG was personally reviewed and demonstrates: 12/11/2018 Sinus tachycardia  with no acute ischemic changes  Telemetry:  Telemetry was personally reviewed and demonstrates: 12/11/2018 ST with rates in the 90's   Relevant CV Studies:  Echocardiogram 08/30/2018:  1. The left ventricle has hyperdynamic systolic function, with an ejection fraction of >65%. There is moderately increased left ventricular wall thickness. Left ventricular diastology could not be evaluated secondary to atrial fibrillation. No evidence  of left ventricular regional wall motion abnormalities.  2. The right ventricle has normal systolic function. The cavity was normal. There is no increase in right ventricular wall thickness.  3. The mitral valve is normal in structure.  4. The aortic valve was not well visualized.  5. The ascending aorta and aortic root are normal in size and structure.  6. No evidence of left ventricular regional wall motion abnormalities.  Cardiac catheterization: 11/09/2005:   CONCLUSION:  Single-vessel coronary artery disease.  She has a stenosis in  the distal aspect of the circumflex  posterolateral branch.  This lesion is  quite long and is very diffusely diseased.  In addition, it has a corkscrew  pattern  and some degree of it may be intramuscular.  This is most consistent with a  hypertensive heart.  This lesion is not amenable to angioplasty or stenting.  We will continue with aggressive medical therapy including blood pressure  control, beta blockers and some nitrates.  She has normal left ventricular  systolic function.   Laboratory Data:  Chemistry Recent Labs  Lab 12/10/18 1957 12/11/18 0545  NA 144 145  K 2.8* 2.7*  CL 103 108  CO2 29 25  GLUCOSE 105* 99  BUN 9 8  CREATININE 0.71 0.57  CALCIUM 9.6 9.0  GFRNONAA >60 >60  GFRAA >60 >60  ANIONGAP 12 12    Total Protein  Date Value Ref Range Status  12/10/2018 6.9 6.5 - 8.1 g/dL Corrected   Albumin  Date Value Ref Range Status  12/10/2018 3.9 3.5 - 5.0 g/dL Corrected   AST  Date Value  Ref Range Status  12/10/2018 24 15 - 41 U/L Corrected   ALT  Date Value Ref Range Status  12/10/2018 18 0 - 44 U/L Corrected   Alkaline Phosphatase  Date Value Ref Range Status  12/10/2018 43 38 - 126 U/L Corrected   Total Bilirubin  Date Value Ref Range Status  12/10/2018 0.6 0.3 - 1.2 mg/dL Corrected   Hematology Recent Labs  Lab 12/10/18 1957  WBC 6.7  RBC 4.93  HGB 14.1  HCT 42.9  MCV 87.0  MCH 28.6  MCHC 32.9  RDW 13.9  PLT 196   Cardiac Enzymes Recent Labs  Lab 12/10/18 1957 12/10/18 2351 12/11/18 0314 12/11/18 0545  TROPONINI 0.03* 0.04* 0.04* 0.04*   No results for input(s): TROPIPOC in the last 168 hours.  BNP Recent Labs  Lab 12/10/18 1957  BNP 54.9    DDimer No results for input(s): DDIMER in the last 168 hours. TSH:  Lab Results  Component Value Date   TSH 2.114 12/13/2017   Lipids:No results found for: CHOL, HDL, LDLCALC, LDLDIRECT, TRIG, CHOLHDL HgbA1c:No results found for: HGBA1C  Radiology/Studies:  Dg Chest Port 1 View  Result Date: 12/10/2018 CLINICAL DATA:  Chest pain EXAM: PORTABLE CHEST 1 VIEW COMPARISON:  08/28/2018 FINDINGS: Heart is borderline in size. Lungs clear. No effusions or edema. No acute bony abnormality. IMPRESSION: No active disease. Electronically Signed   By: Charlett Nose M.D.   On: 12/10/2018 19:09   Assessment and Plan:   1. Chest pain with hx of known LCX stenosis from 2007: -Presented with chest pain which was relieved with SL NTG -Troponin elevated at 0.03, 0.03, 0.03>>>flat trend, not consistent with ACS -EKG with no acute changes   -Has known CAD disease per cath cath in 2007>>treated medically  -No recurrent chest pain since admission and reports no prior episode  -Will continue ASA 81, Plavix, statin and metoprolol -Will increase metoprolol to  twice daily (rfom 12.5mg ) -Restart low dose Imdur  given c/o chest pain. -Not an invasive cath candidate given advanced age and patient preference.    2. HLD: -Stable, managed by PCP, Dr. [name] -Continue simvastatin  3. HTN: -Elevated on presentation at 169/90 -Continue amlodipine  -Increase metoprolol to  twice daily    For questions or updates, please contact [facility] Please consult [email or web address] for contact info under Cardiology/STEMI.   SignedGeorgie Chard NP-C [facility] Pager: [phone] 12/11/2018 2:13 PM    ATTENDING ATTESTATION  I have seen, examined and evaluated the patient this PM along with Georgie ChardJill McDaniel, NP-C.  After reviewing all the available data and chart, we discussed the patients laboratory, study & physical findings as well as symptoms in detail. I agree with her findings, examination as well as impression recommendations as per our discussion.    Kimberly Bass is a very pleasant 85 year old woman with a history of mild CAD treated medically from years ago.  She presented with an episode of left-sided chest discomfort that is relatively reproducible on exam on the rib just underneath her breast.  She also then noted shortly after that having more central chest pain with some radiation down the left arm.  The central chest pain is also somewhat reproducible on exam.  Very difficult historian, very difficult to tell truthfully whether this was made worse with any walking or not.  She noticed it while going to the nursing station at her living facility.  It sounds like she may have recently had some relief with sublingual nitroglycerin, but cannot tell me how fast it worked.  She does note that her blood pressure has been running a little high.  Her chest pain has both typical and atypical features.  She ruled out for MI with negative troponins, and EKG is relatively nonspecific.  We talked about the concept of potentially doing stress test versus other ischemic evaluations, but that would lead to a cardiac catheterization which at this point she is not necessarily interested in considering.  As such  for now, I would not recommend ischemic evaluation.  Plan: Agree with increasing beta-blocker dose and adding back low-dose Imdur.  Can be set up for follow-up visit in the clinic to determine if her symptoms are improving.  If he still has episodes of chest pain, we can then discuss her thoughts on ischemic evaluation versus simply increasing medications.  Would be okay to discharge from cardiology standpoint.   [facility] will sign off.   Medication Recommendations:  Per note - increase Metoprolol to 25 mg BID & Add Imdur 15 mg  Other recommendations (labs, testing, etc):  n/a Follow up as an outpatient:  ~ 1 month in Cardiology Clinic  (will arrange)     Bryan Lemmaavid Arcelia Pals, M.D., M.S. Interventional Cardiologist   Pager # [phone] Phone # [phone] [address] [place], [place] [address]

## 2018-12-11 NOTE — Progress Notes (Signed)
PROGRESS NOTE    Kimberly Bass  VWU:[id number] DOB: 09/26/1925 DOA: 12/10/2018 PCP: Merri BrunettePharr, Walter, MD   Brief Narrative: Patient is a 85 year old female with history of hypertension, stroke, coronary artery disease who presents to the emergency department from an assisted living facility with complaints of left-sided chest pain.  Chest pain improved with sublingual nitroglycerin.  Currently she remains chest pain-free.  Also found to have severe hypokalemia.  Being supplemented with potassium. Cardiology consulted.  Assessment & Plan:   Principal Problem:   Chest pain, rule out acute myocardial infarction Active Problems:   Essential hypertension  Chest pain: Likely atypical chest pain.  EKG did not show any new ischemic changes.  Troponins very mildly elevated and flat.  Currently chest pain-free. She has history of coronary artery disease. Continue her home meds.  Cardiology consulted.  Hypertension: Currently mildly hypertensive.  Continue her home meds.  Hypokalemia: Severe.  Currently being supplemented.  Magnesium also supplemented.  Will check BMP tomorrow.          DVT prophylaxis: Lovenox Code Status: DNR Family Communication: Called son on phone  Disposition Plan: Likely home tomorrow   Consultants: Cardiology  Procedures:None  Antimicrobials:  Anti-infectives (From admission, onward)   None      Subjective:  Patient seen and examined the bedside this afternoon.  Currently comfortable, chest pain-free.  Sitting in the chair.  Denies any complaints. Objective: Vitals:   12/11/18 0554 12/11/18 0943 12/11/18 0951 12/11/18 1117  BP: (!) 141/61 (!) 148/89  (!) 150/73  Pulse: 81 94  82  Resp: 18 16  16   Temp: 98.4 F (36.9 C) 98.2 F (36.8 C)  98.6 F (37 C)  TempSrc: Oral Oral  Oral  SpO2: 91% 92%  93%  Weight:   65.5 kg   Height:        Intake/Output Summary (Last 24 hours) at 12/11/2018 1302 Last data filed at 12/11/2018 1259 Gross per 24  hour  Intake 337 ml  Output 900 ml  Net -563 ml   Filed Weights   12/10/18 1821 12/10/18 2341 12/11/18 0951  Weight: 67 kg 65.8 kg 65.5 kg    Examination:  General exam: Appears calm and comfortable ,Not in distress,average built,pleasant elderly female HEENT:PERRL,Oral mucosa moist, Ear/Nose normal on gross exam Respiratory system: Bilateral equal air entry, normal vesicular breath sounds, no wheezes or crackles  Cardiovascular system: S1 & S2 heard, RRR. No JVD, murmurs, rubs, gallops or clicks. Trace pedal edema. Gastrointestinal system: Abdomen is nondistended, soft and nontender. No organomegaly or masses felt. Normal bowel sounds heard. Central nervous system: Alert and oriented. No focal neurological deficits. Extremities:Trace pedal dema, no clubbing ,no cyanosis, distal peripheral pulses palpable. Skin: No rashes, lesions or ulcers,no icterus ,no pallor MSK: Normal muscle bulk,tone ,power Psychiatry: Judgement and insight appear normal. Mood & affect appropriate.     Data Reviewed: I have personally reviewed following labs and imaging studies  CBC: Recent Labs  Lab 12/10/18 1957  WBC 6.7  NEUTROABS 4.6  HGB 14.1  HCT 42.9  MCV 87.0  PLT 196   Basic Metabolic Panel: Recent Labs  Lab 12/10/18 1957 12/11/18 0545  NA 144 145  K 2.8* 2.7*  CL 103 108  CO2 29 25  GLUCOSE 105* 99  BUN 9 8  CREATININE 0.71 0.57  CALCIUM 9.6 9.0  MG 1.7  --    GFR: Estimated Creatinine Clearance: 37.7 mL/min (by C-G formula based on SCr of 0.57 mg/dL). Liver Function Tests: Recent  Labs  Lab 12/10/18 1957  AST 24  ALT 18  ALKPHOS 43  BILITOT 0.6  PROT 6.9  ALBUMIN 3.9   No results for input(s): LIPASE, AMYLASE in the last 168 hours. No results for input(s): AMMONIA in the last 168 hours. Coagulation Profile: No results for input(s): INR, PROTIME in the last 168 hours. Cardiac Enzymes: Recent Labs  Lab 12/10/18 1957 12/10/18 2351 12/11/18 0314 12/11/18 0545   TROPONINI 0.03* 0.04* 0.04* 0.04*   BNP (last 3 results) No results for input(s): PROBNP in the last 8760 hours. HbA1C: No results for input(s): HGBA1C in the last 72 hours. CBG: Recent Labs  Lab 12/11/18 0734 12/11/18 1118  GLUCAP 88 102*   Lipid Profile: No results for input(s): CHOL, HDL, LDLCALC, TRIG, CHOLHDL, LDLDIRECT in the last 72 hours. Thyroid Function Tests: No results for input(s): TSH, T4TOTAL, FREET4, T3FREE, THYROIDAB in the last 72 hours. Anemia Panel: No results for input(s): VITAMINB12, FOLATE, FERRITIN, TIBC, IRON, RETICCTPCT in the last 72 hours. Sepsis Labs: No results for input(s): PROCALCITON, LATICACIDVEN in the last 168 hours.  No results found for this or any previous visit (from the past 240 hour(s)).       Radiology Studies: Dg Chest Port 1 View  Result Date: 12/10/2018 CLINICAL DATA:  Chest pain EXAM: PORTABLE CHEST 1 VIEW COMPARISON:  08/28/2018 FINDINGS: Heart is borderline in size. Lungs clear. No effusions or edema. No acute bony abnormality. IMPRESSION: No active disease. Electronically Signed   By: Rolm Baptise M.D.   On: 12/10/2018 19:09        Scheduled Meds: . amLODipine  5 mg Oral Daily  . aspirin EC  81 mg Oral Daily  . calcium-vitamin D  1 tablet Oral Daily  . clopidogrel  75 mg Oral Daily  . enoxaparin (LOVENOX) injection  40 mg Subcutaneous Q24H  . gabapentin  400 mg Oral QID  . levothyroxine  100 mcg Oral Q0600  . metoprolol tartrate  12.5 mg Oral BID  . mirabegron ER  50 mg Oral Daily  . multivitamin with minerals  1 tablet Oral Daily  . pantoprazole  20 mg Oral QAC breakfast  . potassium chloride  40 mEq Oral Q3H  . simvastatin  20 mg Oral QHS  . vitamin B-12  2,500 mcg Oral Daily   Continuous Infusions: . magnesium sulfate bolus IVPB 2 g (12/11/18 1209)     LOS: 0 days    Time spent: 35 mins.More than 50% of that time was spent in counseling and/or coordination of care.      Shelly Coss, MD [facility] Pager [phone]  If 7PM-7AM, please contact night-coverage [email or web address] Password [email or web address] 12/11/2018, 1:02 PM

## 2018-12-11 NOTE — TOC Initial Note (Signed)
Transition of Care [facility]) - Initial/Assessment Note    Patient Details  Name: Kimberly Bass MRN: [id number] Date of Birth: 27-Nov-1925  Transition of Care [facility]) CM/SW Contact:    Gelene Mink, [place] Phone Number: 12/11/2018, 3:06 PM  Clinical Narrative:                  CSW met with the patient at bedside. The patient was alert and oriented. CSW introduced herself and explained her role. The patient is expecting to discharge back to assisted living facility once medically stable.   CSW will continue to follow and assist with discharge planning.    Expected Discharge Plan: Assisted Living Barriers to Discharge: Continued Medical Work up   Patient Goals and CMS Choice Patient states their goals for this hospitalization and ongoing recovery are:: Pt will return to ALF once medically ready for discharge. CMS Medicare.gov Compare Post Acute Care list provided to:: Other (Comment Required)(NA) Choice offered to / list presented to : NA  Expected Discharge Plan and Services Expected Discharge Plan: Assisted Living In-house Referral: Clinical Social Work     Living arrangements for the past 2 months: [place]                                      Prior Living Arrangements/Services Living arrangements for the past 2 months: [place] Lives with:: Facility Resident Patient language and need for interpreter reviewed:: No Do you feel safe going back to the place where you live?: Yes      Need for Family Participation in Patient Care: Yes (Comment) Care giver support system in place?: Yes (comment)   Criminal Activity/Legal Involvement Pertinent to Current Situation/Hospitalization: No - Comment as needed  Activities of Daily Living Home Assistive Devices/Equipment: Bedside commode/3-in-1 ADL Screening (condition at time of admission) Patient's cognitive ability adequate to safely complete daily activities?: No Is the patient deaf or  have difficulty hearing?: Yes Does the patient have difficulty seeing, even when wearing glasses/contacts?: No Does the patient have difficulty concentrating, remembering, or making decisions?: No Patient able to express need for assistance with ADLs?: Yes Does the patient have difficulty dressing or bathing?: No Independently performs ADLs?: Yes (appropriate for developmental age) Does the patient have difficulty walking or climbing stairs?: Yes Weakness of Legs: Both Weakness of Arms/Hands: None  Permission Sought/Granted Permission sought to share information with : Case Manager Permission granted to share information with : Yes, Verbal Permission Granted  Share Information with NAME: Shanon Brow  Permission granted to share info w AGENCY: [employer] Executive [employer]  Permission granted to share info w Relationship: Son     Emotional Assessment Appearance:: Appears stated age Attitude/Demeanor/Rapport: Engaged Affect (typically observed): Calm Orientation: : Oriented to Self, Oriented to Place, Oriented to  Time, Oriented to Situation Alcohol / Substance Use: Not Applicable Psych Involvement: No (comment)  Admission diagnosis:  Chest Pain Patient Active Problem List   Diagnosis Date Noted  . Chest pain, rule out acute myocardial infarction 12/10/2018  . 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 Asthmatic bronchitis 09/04/2018  . Syncope 09/01/2018  . PAF (paroxysmal atrial fibrillation) ([place]) 09/01/2018  . Traumatic hematoma of knee, left, initial encounter 09/01/2018  . RLS (restless legs syndrome) 09/01/2018  . Bradycardia 08/30/2018  . Elevated troponin 08/30/2018  . Rhabdomyolysis 08/29/2018  . Hypomagnesemia   . Pyuria 08/01/2018  . AMS (altered mental status) 08/01/2018  . Altered  mental status 08/01/2018  . Hypothyroidism 12/12/2017  . Acute encephalopathy 12/12/2017  . Essential hypertension 12/12/2017  . Hypokalemia 12/12/2017  . Hyperlipidemia 05/05/2017  . Urinary tract infection with  hematuria 04/27/2017  . Tremor, essential 11/03/2015  . 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 Abnormality of gait 08/15/2012  . Essential and other specified forms of tremor 08/15/2012  . Degeneration of lumbar or lumbosacral intervertebral disc 08/15/2012  . Hx of low back pain 08/15/2012  . CAD (coronary artery disease) 12/23/2010  . Hx of transient ischemic attack (TIA)   . CONSTIPATION 02/27/2009  . PERSONAL HX COLONIC POLYPS 02/27/2009   PCP:  Deland Pretty, MD Pharmacy:   [facility], [address] [place] [address] Phone: [phone] Fax: [phone]     Social Determinants of Health (SDOH) Interventions    Readmission Risk Interventions No flowsheet data found.

## 2018-12-11 NOTE — [place] FL2 (Signed)
[place] MEDICAID FL2 LEVEL OF CARE SCREENING TOOL     IDENTIFICATION  Patient Name: Kimberly BrownieJeanneane H Cislo Birthdate: 10/18/1925 Sex: female Admission Date (Current Location): 12/10/2018  [facility] [place]   Facility and Address:  [facility], [address], [place], [place] [address]      Provider Number: [id number]  Attending Physician Name and Address:  Burnadette PopAdhikari, Amrit, MD  Relative Name and Phone Number:  Ellwood HandlerDavid Wyrick, Son, [phone]    Current Level of Care: Hospital Recommended Level of Care: Assisted Living Facility Prior Approval Number:    Date Approved/Denied:   PASRR Number:    Discharge Plan: Home    Current Diagnoses: Patient Active Problem List   Diagnosis Date Noted  . Chest pain, rule out acute myocardial infarction 12/10/2018  . 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 Asthmatic bronchitis 09/04/2018  . Syncope 09/01/2018  . PAF (paroxysmal atrial fibrillation) ([facility]) 09/01/2018  . Traumatic hematoma of knee, left, initial encounter 09/01/2018  . RLS (restless legs syndrome) 09/01/2018  . Bradycardia 08/30/2018  . Elevated troponin 08/30/2018  . Rhabdomyolysis 08/29/2018  . Hypomagnesemia   . Pyuria 08/01/2018  . AMS (altered mental status) 08/01/2018  . Altered mental status 08/01/2018  . Hypothyroidism 12/12/2017  . Acute encephalopathy 12/12/2017  . Essential hypertension 12/12/2017  . Hypokalemia 12/12/2017  . Hyperlipidemia 05/05/2017  . Urinary tract infection with hematuria 04/27/2017  . Tremor, essential 11/03/2015  . 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 Abnormality of gait 08/15/2012  . Essential and other specified forms of tremor 08/15/2012  . Degeneration of lumbar or lumbosacral intervertebral disc 08/15/2012  . Hx of low back pain 08/15/2012  . CAD (coronary artery disease) 12/23/2010  . Hx of transient ischemic attack (TIA)   . CONSTIPATION 02/27/2009  . PERSONAL HX COLONIC POLYPS 02/27/2009     Orientation RESPIRATION BLADDER Height & Weight     Self, Time, Situation, Place  Normal Continent, External catheter Weight: 144 lb 6.4 oz (65.5 kg) Height:  4\' 11"  (149.9 cm)  BEHAVIORAL SYMPTOMS/MOOD NEUROLOGICAL BOWEL NUTRITION STATUS      Continent Diet(Heart Healthy, thin liquids)  AMBULATORY STATUS COMMUNICATION OF NEEDS Skin   Independent Verbally Normal(Dry Skin)                       Personal Care Assistance Level of Assistance  Bathing, Feeding, Dressing, Total care Bathing Assistance: Limited assistance Feeding assistance: Independent Dressing Assistance: Limited assistance Total Care Assistance: Limited assistance   Functional Limitations Info  Sight, Hearing, Speech Sight Info: Impaired Hearing Info: Impaired Speech Info: Adequate    SPECIAL CARE FACTORS FREQUENCY                       Contractures Contractures Info: Not present    Additional Factors Info  Code Status, Allergies Code Status Info: DNR Allergies Info: Penicillins, Codeine, Meperidine Hcl, Morphine           Current Medications (12/11/2018):  This is the current hospital active medication list Current Facility-Administered Medications  Medication Dose Route Frequency Provider Last Rate Last Dose  . acetaminophen (TYLENOL) tablet 650 mg  650 mg Oral Q4H PRN Hillary BowGardner, Jared M, DO      . amLODipine (NORVASC) tablet 5 mg  5 mg Oral Daily Lyda PeroneGardner, Jared M, DO   5 mg at 12/11/18 [id number]  . aspirin EC tablet 81 mg  81 mg Oral Daily Hillary BowGardner, Jared M, DO  81 mg at 12/11/18 0927  . calcium-vitamin D (OSCAL WITH D) 500-200 MG-UNIT per tablet 1 tablet  1 tablet Oral Daily Etta Quill, DO   1 tablet at 12/11/18 0938  . clopidogrel (PLAVIX) tablet 75 mg  75 mg Oral Daily Etta Quill, DO   75 mg at 12/11/18 1829  . enoxaparin (LOVENOX) injection 40 mg  40 mg Subcutaneous Q24H Jennette Kettle M, DO   40 mg at 12/10/18 2357  . gabapentin (NEURONTIN) capsule 400 mg  400 mg Oral QID  Jennette Kettle M, DO   400 mg at 12/11/18 1437  . hydrALAZINE (APRESOLINE) injection 10-20 mg  10-20 mg Intravenous Q4H PRN Etta Quill, DO      . ipratropium-albuterol (DUONEB) 0.5-2.5 (3) MG/3ML nebulizer solution 3 mL  3 mL Nebulization Q6H PRN Etta Quill, DO      . levothyroxine (SYNTHROID) tablet 100 mcg  100 mcg Oral Q0600 Etta Quill, DO      . metoprolol tartrate (LOPRESSOR) tablet 12.5 mg  12.5 mg Oral BID Jennette Kettle M, DO   12.5 mg at 12/11/18 9371  . mirabegron ER (MYRBETRIQ) tablet 50 mg  50 mg Oral Daily Jennette Kettle M, DO   50 mg at 12/11/18 0940  . multivitamin with minerals tablet 1 tablet  1 tablet Oral Daily Etta Quill, DO   1 tablet at 12/11/18 6967  . nitroGLYCERIN (NITROSTAT) SL tablet 0.4 mg  0.4 mg Sublingual Q5 Min x 3 PRN Etta Quill, DO      . ondansetron [facility]) injection 4 mg  4 mg Intravenous Q6H PRN Etta Quill, DO      . pantoprazole (PROTONIX) EC tablet 20 mg  20 mg Oral QAC breakfast Jennette Kettle M, DO   20 mg at 12/11/18 8938  . potassium chloride SA (K-DUR) CR tablet 40 mEq  40 mEq Oral Q3H Adhikari, Amrit, MD   40 mEq at 12/11/18 1437  . simvastatin (ZOCOR) tablet 20 mg  20 mg Oral QHS Jennette Kettle M, DO   20 mg at 12/10/18 2357  . vitamin B-12 (CYANOCOBALAMIN) tablet 2,500 mcg  2,500 mcg Oral Daily Jennette Kettle M, DO   2,500 mcg at 12/11/18 1017     Discharge Medications: Please see discharge summary for a list of discharge medications.  Relevant Imaging Results:  Relevant Lab Results:   Additional Information SSN: [id number]  Philippa Chester Amayrany Cafaro, LCSWA

## 2018-12-12 DIAGNOSIS — R079 Chest pain, unspecified: Secondary | ICD-10-CM | POA: Diagnosis not present

## 2018-12-12 LAB — BASIC METABOLIC PANEL
Anion gap: 8 (ref 5–15)
BUN: 14 mg/dL (ref 8–23)
CO2: 28 mmol/L (ref 22–32)
Calcium: 9.3 mg/dL (ref 8.9–10.3)
Chloride: 109 mmol/L (ref 98–111)
Creatinine, Ser: 0.59 mg/dL (ref 0.44–1.00)
GFR calc Af Amer: >60 mL/min (ref >60)
GFR calc non Af Amer: >60 mL/min (ref >60)
Glucose, Bld: 115 mg/dL — ABNORMAL HIGH (ref 70–99)
Potassium: 4.6 mmol/L (ref 3.5–5.1)
Sodium: 145 mmol/L (ref 135–145)

## 2018-12-12 MED ORDER — ISOSORBIDE MONONITRATE ER 30 MG PO TB24: 15.0000 mg | ORAL_TABLET | Freq: Every day | ORAL | 0 refills | Status: AC

## 2018-12-12 MED ORDER — METOPROLOL TARTRATE 25 MG PO TABS: 25.0000 mg | ORAL_TABLET | Freq: Two times a day (BID) | ORAL | 0 refills | Status: AC

## 2018-12-12 NOTE — Care Management (Signed)
1248 12-12-18 Patient's son will transport the patient via private vehicle back to [place]. Son stated that he will pick the patient up around 1400. No further needs from CM at this time. Bethena Roys, RN,BSN Case Manager [phone]

## 2018-12-12 NOTE — TOC Transition Note (Signed)
Transition of Care [facility]) - CM/SW Discharge Note   Patient Details  Name: ADISEN BENNION MRN: [id number] Date of Birth: 12/23/1925  [facility] of [facility]) CM/SW Contact:  Eileen Stanford, LCSW Phone Number: 12/12/2018, 1:31 PM   Clinical Narrative:   Clinical Social Worker facilitated patient discharge including contacting patient family and facility to confirm patient discharge plans.  Clinical information faxed to facility and family agreeable with plan. Pt's son will transport pt to [place] .  RN to call [phone] for report prior to discharge.    Final next level of care: Assisted Living Barriers to Discharge: No Barriers Identified   Patient Goals and CMS Choice Patient states their goals for this hospitalization and ongoing recovery are:: to return to [place] CMS Medicare.gov Compare Post Acute Care list provided to:: Other (Comment Required)(NA) Choice offered to / list presented to : NA  Discharge Placement                Patient to be transferred to facility by: Son Name of family member notified: Shanon Brow Patient and family notified of of transfer: 12/12/18  Discharge Plan and Services In-house Referral: Clinical Social Work   Post Acute Care Choice: NA                               Social Determinants of Health (SDOH) Interventions     Readmission Risk Interventions No flowsheet data found.

## 2018-12-12 NOTE — Care Management Obs Status (Signed)
[place] NOTIFICATION   Patient Details  Name: Kimberly Bass MRN: [id number] Date of Birth: 06/19/1926   Medicare Observation Status Notification Given:  Yes    Bethena Roys, RN 12/12/2018, 10:06 AM

## 2018-12-12 NOTE — Discharge Summary (Signed)
Physician Discharge Summary  Pearlean BrownieJeanneane H Cantu ZOX:[id number] DOB: 09/25/1925 DOA: 12/10/2018  PCP: Merri BrunettePharr, Walter, MD  Admit date: 12/10/2018 Discharge date: 12/12/2018  Admitted From: Home Disposition:  Home  Discharge Condition:Stable CODE STATUS: DNR Diet recommendation: Heart Healthy  Brief/Interim Summary: Patient is a 85 year old female with history of hypertension, stroke, coronary artery disease who presents to the emergency department from an assisted living facility with complaints of left-sided chest pain.  Chest pain improved with sublingual nitroglycerin.  Currently she remains chest pain-free.  Also found to have severe hypokalemia.  Supplemented with potassium. Cardiology consulted.  There is no plan for further intervention for now.  She will follow-up with cardiology as an outpatient.  She is hemodynamically stable for discharge today to home.  Following problems were addressed during her  Hospitalization:  Chest pain: Likely atypical chest pain.  EKG did not show any new ischemic changes.  Troponins very mildly elevated and flat.  Currently chest pain-free. She has history of coronary artery disease treated medically  Cardiology consulted.  Dose of metoprolol increased, started on Imdur.  She will follow-up with cardiology as an outpatient.  Hypertension: Adjusted medications.  Continue her other home meds.  Hypokalemia: Supplemented and corrected.  Discharge Diagnoses:  Principal Problem:   Chest pain, rule out acute myocardial infarction Active Problems:   Essential hypertension    Discharge Instructions  Discharge Instructions    Diet - low sodium heart healthy   Complete by: As directed    Discharge instructions   Complete by: As directed    1)Please follow up with your PCP as an outpatient in a week. 2)Take prescribed medicines as instructed. 3)You will be called by cardiology for an appointment.   Increase activity slowly   Complete by: As  directed      Allergies as of 12/12/2018      Reactions   Penicillins Shortness Of Breath, Nausea Only, Rash   Has patient had a PCN reaction causing immediate rash, facial/tongue/throat swelling, SOB or lightheadedness with hypotension: Yes Has patient had a PCN reaction causing severe rash involving mucus membranes or skin necrosis: Yes Has patient had a PCN reaction that required hospitalization: Yes Has patient had a PCN reaction occurring within the last 10 years: >50 years ago If all of the above answers are "NO", then may proceed with Cephalosporin use.   Codeine Nausea Only   Meperidine Hcl Nausea Only   Morphine Nausea Only      Medication List    TAKE these medications   amLODipine 5 MG tablet Commonly known as: NORVASC Take 5 mg by mouth daily.   aspirin EC 81 MG tablet Take 81 mg by mouth daily.   Calcium 500+D High Potency 500-400 MG-UNIT tablet Generic drug: calcium-vitamin D Take 1 tablet by mouth daily.   clopidogrel 75 MG tablet Commonly known as: PLAVIX TAKE 1 TABLET BY MOUTH  DAILY   Ensure Take 237 mLs by mouth 2 (two) times daily as needed (for supplementation).   gabapentin 400 MG capsule Commonly known as: NEURONTIN TAKE 1 CAPSULE (400 MG TOTAL) BY MOUTH 4 (FOUR) TIMES DAILY.   ipratropium-albuterol 0.5-2.5 (3) MG/3ML Soln Commonly known as: DUONEB Take 3 mLs by nebulization. INHALE 1 VIAL VIA NEBULIZER EVERY 6 HOURS AS NEEDED FOR WHEEZING   isosorbide mononitrate 30 MG 24 hr tablet Commonly known as: IMDUR Take 0.5 tablets (15 mg total) by mouth daily. Start taking on: December 13, 2018   levothyroxine 100 MCG tablet Commonly known as:  SYNTHROID Take 100 mcg by mouth daily before breakfast.   magnesium hydroxide 400 MG/5ML suspension Commonly known as: MILK OF MAGNESIA Constipation (1 of 4): If no BM in 3 days, give 30 cc Milk of Magnesium p.o. x 1 dose in 24 hours as needed (Do not use standing constipation orders for residents with renal  failure CFR less than 30. Contact MD for orders)   metoprolol tartrate 25 MG tablet Commonly known as: LOPRESSOR Take 1 tablet (25 mg total) by mouth 2 (two) times daily. What changed: how much to take   multivitamin with minerals Tabs tablet Take 1 tablet by mouth daily.   Myrbetriq 50 MG Tb24 tablet Generic drug: mirabegron ER Take 50 mg by mouth daily.   Nitrostat 0.4 MG SL tablet Generic drug: nitroGLYCERIN DISSOLVE 1 TAB UNDER THE  TONGUE EVERY 5 MINUTES AS  NEEDED FOR CHEST PAIN. MAX  3 TOTAL DOSES/15MIN. CALL  911 IF NO RELIEF What changed:   how much to take  how to take this  when to take this  reasons to take this  additional instructions   NON FORMULARY Mighty Shake (chocolate if available) Drink 1 shake by mouth two times a day   pantoprazole 20 MG tablet Commonly known as: PROTONIX Take 20 mg by mouth daily before breakfast. DO NOT CRUSH   simvastatin 20 MG tablet Commonly known as: ZOCOR Take 20 mg by mouth at bedtime.   Tylenol Arthritis Pain 650 MG CR tablet Generic drug: acetaminophen Take 1,300 mg by mouth 2 (two) times a day.   Vitamin B-12 2500 MCG Subl Place 2,500 mcg under the tongue daily.      Follow-up Information    Merri BrunettePharr, Walter, MD.   Specialty: Internal Medicine Contact information: [address] [place] [address] [phone]          Allergies  Allergen Reactions  . Penicillins Shortness Of Breath, Nausea Only and Rash    Has patient had a PCN reaction causing immediate rash, facial/tongue/throat swelling, SOB or lightheadedness with hypotension: Yes Has patient had a PCN reaction causing severe rash involving mucus membranes or skin necrosis: Yes Has patient had a PCN reaction that required hospitalization: Yes Has patient had a PCN reaction occurring within the last 10 years: >50 years ago If all of the above answers are "NO", then may proceed with Cephalosporin use.   . Codeine Nausea Only  .  Meperidine Hcl Nausea Only  . Morphine Nausea Only    Consultations:  Cardiology   Procedures/Studies: Dg Chest Port 1 View  Result Date: 12/10/2018 CLINICAL DATA:  Chest pain EXAM: PORTABLE CHEST 1 VIEW COMPARISON:  08/28/2018 FINDINGS: Heart is borderline in size. Lungs clear. No effusions or edema. No acute bony abnormality. IMPRESSION: No active disease. Electronically Signed   By: Charlett NoseKevin  Dover M.D.   On: 12/10/2018 19:09       Subjective: Patient seen and examined the bedside this morning.  Remains comfortable.  Hemodynamically stable for discharge.  Discharge Exam: Vitals:   12/12/18 0029 12/12/18 0632  BP: (!) 98/54 (!) 139/91  Pulse: 79 86  Resp: 18 18  Temp: 98.9 F (37.2 C) 98.3 F (36.8 C)  SpO2: 93% 94%   Vitals:   12/11/18 1117 12/12/18 0029 12/12/18 0500 12/12/18 0632  BP: (!) 150/73 (!) 98/54  (!) 139/91  Pulse: 82 79  86  Resp: 16 18  18   Temp: 98.6 F (37 C) 98.9 F (37.2 C)  98.3 F (36.8  C)  TempSrc: Oral   Oral  SpO2: 93% 93%  94%  Weight:   65.3 kg   Height:        General: Pt is alert, awake, not in acute distress Cardiovascular: RRR, S1/S2 +, no rubs, no gallops Respiratory: CTA bilaterally, no wheezing, no rhonchi Abdominal: Soft, NT, ND, bowel sounds + Extremities: no edema, no cyanosis    The results of significant diagnostics from this hospitalization (including imaging, microbiology, ancillary and laboratory) are listed below for reference.     Microbiology: Recent Results (from the past 240 hour(s))  Novel Coronavirus,NAA,(SEND-OUT TO REF LAB - TAT 24-48 hrs); Hosp Order     Status: None   Collection Time: 12/10/18  7:11 PM   Specimen: Nasopharyngeal Swab; Respiratory  Result Value Ref Range Status   SARS-CoV-2, NAA NOT DETECTED NOT DETECTED Final    Comment: (NOTE) Testing was performed using the cobas(R) SARS-CoV-2 test. This test was developed and its performance characteristics determined by [employer].  This test has not been FDA cleared or approved. This test has been authorized by FDA under an Emergency Use Authorization (EUA). This test is only authorized for the duration of time the declaration that circumstances exist justifying the authorization of the emergency use of in vitro diagnostic tests for detection of SARS-CoV-2 virus and/or diagnosis of COVID-19 infection under section 564(b)(1) of the Act, 21 U.S.C. 381WEX-9(B)(7), unless the authorization is terminated or revoked sooner. When diagnostic testing is negative, the possibility of a false negative result should be considered in the context of a patient's recent exposures and the presence of clinical signs and symptoms consistent with COVID-19. An individual without symptoms of COVID-19 and who is not shedding SARS-CoV-2 virus would expect to have  a negative (not detected) result in this assay. Performed At: [facility] [address] [place], [place] [id number] Rush Farmer MD BO:[id number]    [place]  Final    Comment: Performed at [facility], [place] [address] [place], [place] [address]     Labs: BNP (last 3 results) Recent Labs    12/10/18 1957  BNP 23.5   Basic Metabolic Panel: Recent Labs  Lab 12/10/18 1957 12/11/18 0545 12/12/18 0532  NA 144 145 145  K 2.8* 2.7* 4.6  CL 103 108 109  CO2 29 25 28   GLUCOSE 105* 99 115*  BUN 9 8 14   CREATININE 0.71 0.57 0.59  CALCIUM 9.6 9.0 9.3  MG 1.7  --   --    Liver Function Tests: Recent Labs  Lab 12/10/18 1957  AST 24  ALT 18  ALKPHOS 43  BILITOT 0.6  PROT 6.9  ALBUMIN 3.9   No results for input(s): LIPASE, AMYLASE in the last 168 hours. No results for input(s): AMMONIA in the last 168 hours. CBC: Recent Labs  Lab 12/10/18 1957  WBC 6.7  NEUTROABS 4.6  HGB 14.1  HCT 42.9  MCV 87.0  PLT 196   Cardiac Enzymes: Recent Labs  Lab 12/10/18 1957 12/10/18 2351 12/11/18 0314 12/11/18 0545   TROPONINI 0.03* 0.04* 0.04* 0.04*   BNP: Invalid input(s): POCBNP CBG: Recent Labs  Lab 12/11/18 0734 12/11/18 1118 12/11/18 1724  GLUCAP 88 102* 101*   D-Dimer No results for input(s): DDIMER in the last 72 hours. Hgb A1c No results for input(s): HGBA1C in the last 72 hours. Lipid Profile No results for input(s): CHOL, HDL, LDLCALC, TRIG, CHOLHDL, LDLDIRECT in the last 72 hours. Thyroid function studies No results  for input(s): TSH, T4TOTAL, T3FREE, THYROIDAB in the last 72 hours.  Invalid input(s): FREET3 Anemia work up No results for input(s): VITAMINB12, FOLATE, FERRITIN, TIBC, IRON, RETICCTPCT in the last 72 hours. Urinalysis    Component Value Date/Time   COLORURINE YELLOW 08/01/2018 1438   APPEARANCEUR CLOUDY (A) 08/01/2018 1438   LABSPEC 1.013 08/01/2018 1438   PHURINE 8.0 08/01/2018 1438   GLUCOSEU NEGATIVE 08/01/2018 1438   HGBUR NEGATIVE 08/01/2018 1438   BILIRUBINUR NEGATIVE 08/01/2018 1438   KETONESUR NEGATIVE 08/01/2018 1438   PROTEINUR NEGATIVE 08/01/2018 1438   NITRITE NEGATIVE 08/01/2018 1438   LEUKOCYTESUR TRACE (A) 08/01/2018 1438   Sepsis Labs Invalid input(s): PROCALCITONIN,  WBC,  LACTICIDVEN Microbiology Recent Results (from the past 240 hour(s))  Novel Coronavirus,NAA,(SEND-OUT TO REF LAB - TAT 24-48 hrs); Hosp Order     Status: None   Collection Time: 12/10/18  7:11 PM   Specimen: Nasopharyngeal Swab; Respiratory  Result Value Ref Range Status   SARS-CoV-2, NAA NOT DETECTED NOT DETECTED Final    Comment: (NOTE) Testing was performed using the cobas(R) SARS-CoV-2 test. This test was developed and its performance characteristics determined by [facility]. This test has not been FDA cleared or approved. This test has been authorized by FDA under an Emergency Use Authorization (EUA). This test is only authorized for the duration of time the declaration that circumstances exist justifying the authorization of the emergency use of  in vitro diagnostic tests for detection of SARS-CoV-2 virus and/or diagnosis of COVID-19 infection under section 564(b)(1) of the Act, 21 U.S.C. 409WJX-9(J)(4360bbb-3(b)(1), unless the authorization is terminated or revoked sooner. When diagnostic testing is negative, the possibility of a false negative result should be considered in the context of a patient's recent exposures and the presence of clinical signs and symptoms consistent with COVID-19. An individual without symptoms of COVID-19 and who is not shedding SARS-CoV-2 virus would expect to have  a negative (not detected) result in this assay. Performed At: [facility] [address] [place], [place] [id number] Jolene SchimkeNagendra Sanjai MD YQ:[id number]:[phone]    Coronavirus Source NASOPHARYNGEAL  Final    Comment: Performed at [facility], [address] [place], [place] [address]    Please note: You were cared for by a hospitalist during your hospital stay. Once you are discharged, your primary care physician will handle any further medical issues. Please note that NO REFILLS for any discharge medications will be authorized once you are discharged, as it is imperative that you return to your primary care physician (or establish a relationship with a primary care physician if you do not have one) for your post hospital discharge needs so that they can reassess your need for medications and monitor your lab values.    Time coordinating discharge: 40 minutes  SIGNED:   Burnadette PopAmrit Tamorah Hada, MD  [facility] 12/12/2018, 10:55 AM Pager [phone]  If 7PM-7AM, please contact night-coverage [email or web address] Password [email or web address]

## 2018-12-13 DIAGNOSIS — I10 Essential (primary) hypertension: Secondary | ICD-10-CM | POA: Diagnosis not present

## 2018-12-13 DIAGNOSIS — G2581 Restless legs syndrome: Secondary | ICD-10-CM | POA: Diagnosis not present

## 2018-12-13 DIAGNOSIS — M1990 Unspecified osteoarthritis, unspecified site: Secondary | ICD-10-CM | POA: Diagnosis not present

## 2018-12-13 DIAGNOSIS — Z9181 History of falling: Secondary | ICD-10-CM | POA: Diagnosis not present

## 2018-12-13 DIAGNOSIS — M6282 Rhabdomyolysis: Secondary | ICD-10-CM | POA: Diagnosis not present

## 2018-12-13 DIAGNOSIS — L959 Vasculitis limited to the skin, unspecified: Secondary | ICD-10-CM | POA: Diagnosis not present

## 2018-12-13 DIAGNOSIS — Z8673 Personal history of transient ischemic attack (TIA), and cerebral infarction without residual deficits: Secondary | ICD-10-CM | POA: Diagnosis not present

## 2018-12-13 DIAGNOSIS — I2510 Atherosclerotic heart disease of native coronary artery without angina pectoris: Secondary | ICD-10-CM | POA: Diagnosis not present

## 2018-12-13 DIAGNOSIS — Z7902 Long term (current) use of antithrombotics/antiplatelets: Secondary | ICD-10-CM | POA: Diagnosis not present

## 2018-12-13 DIAGNOSIS — Z7982 Long term (current) use of aspirin: Secondary | ICD-10-CM | POA: Diagnosis not present

## 2018-12-13 DIAGNOSIS — I480 Paroxysmal atrial fibrillation: Secondary | ICD-10-CM | POA: Diagnosis not present

## 2018-12-13 DIAGNOSIS — J45909 Unspecified asthma, uncomplicated: Secondary | ICD-10-CM | POA: Diagnosis not present

## 2018-12-14 DIAGNOSIS — I10 Essential (primary) hypertension: Secondary | ICD-10-CM | POA: Diagnosis not present

## 2018-12-14 DIAGNOSIS — K5901 Slow transit constipation: Secondary | ICD-10-CM | POA: Diagnosis not present

## 2018-12-14 DIAGNOSIS — R531 Weakness: Secondary | ICD-10-CM | POA: Diagnosis not present

## 2018-12-14 DIAGNOSIS — R079 Chest pain, unspecified: Secondary | ICD-10-CM | POA: Diagnosis not present

## 2018-12-14 DIAGNOSIS — E876 Hypokalemia: Secondary | ICD-10-CM | POA: Diagnosis not present

## 2018-12-15 DIAGNOSIS — Z9181 History of falling: Secondary | ICD-10-CM | POA: Diagnosis not present

## 2018-12-15 DIAGNOSIS — I10 Essential (primary) hypertension: Secondary | ICD-10-CM | POA: Diagnosis not present

## 2018-12-15 DIAGNOSIS — Z7902 Long term (current) use of antithrombotics/antiplatelets: Secondary | ICD-10-CM | POA: Diagnosis not present

## 2018-12-15 DIAGNOSIS — M6282 Rhabdomyolysis: Secondary | ICD-10-CM | POA: Diagnosis not present

## 2018-12-15 DIAGNOSIS — I480 Paroxysmal atrial fibrillation: Secondary | ICD-10-CM | POA: Diagnosis not present

## 2018-12-15 DIAGNOSIS — M1990 Unspecified osteoarthritis, unspecified site: Secondary | ICD-10-CM | POA: Diagnosis not present

## 2018-12-15 DIAGNOSIS — I2510 Atherosclerotic heart disease of native coronary artery without angina pectoris: Secondary | ICD-10-CM | POA: Diagnosis not present

## 2018-12-15 DIAGNOSIS — J45909 Unspecified asthma, uncomplicated: Secondary | ICD-10-CM | POA: Diagnosis not present

## 2018-12-15 DIAGNOSIS — L959 Vasculitis limited to the skin, unspecified: Secondary | ICD-10-CM | POA: Diagnosis not present

## 2018-12-15 DIAGNOSIS — Z7982 Long term (current) use of aspirin: Secondary | ICD-10-CM | POA: Diagnosis not present

## 2018-12-15 DIAGNOSIS — Z8673 Personal history of transient ischemic attack (TIA), and cerebral infarction without residual deficits: Secondary | ICD-10-CM | POA: Diagnosis not present

## 2018-12-15 DIAGNOSIS — G2581 Restless legs syndrome: Secondary | ICD-10-CM | POA: Diagnosis not present

## 2018-12-18 DIAGNOSIS — J45909 Unspecified asthma, uncomplicated: Secondary | ICD-10-CM | POA: Diagnosis not present

## 2018-12-18 DIAGNOSIS — Z7902 Long term (current) use of antithrombotics/antiplatelets: Secondary | ICD-10-CM | POA: Diagnosis not present

## 2018-12-18 DIAGNOSIS — I480 Paroxysmal atrial fibrillation: Secondary | ICD-10-CM | POA: Diagnosis not present

## 2018-12-18 DIAGNOSIS — I2510 Atherosclerotic heart disease of native coronary artery without angina pectoris: Secondary | ICD-10-CM | POA: Diagnosis not present

## 2018-12-18 DIAGNOSIS — I10 Essential (primary) hypertension: Secondary | ICD-10-CM | POA: Diagnosis not present

## 2018-12-18 DIAGNOSIS — Z9181 History of falling: Secondary | ICD-10-CM | POA: Diagnosis not present

## 2018-12-18 DIAGNOSIS — L959 Vasculitis limited to the skin, unspecified: Secondary | ICD-10-CM | POA: Diagnosis not present

## 2018-12-18 DIAGNOSIS — G2581 Restless legs syndrome: Secondary | ICD-10-CM | POA: Diagnosis not present

## 2018-12-18 DIAGNOSIS — M6282 Rhabdomyolysis: Secondary | ICD-10-CM | POA: Diagnosis not present

## 2018-12-18 DIAGNOSIS — Z7982 Long term (current) use of aspirin: Secondary | ICD-10-CM | POA: Diagnosis not present

## 2018-12-18 DIAGNOSIS — M1990 Unspecified osteoarthritis, unspecified site: Secondary | ICD-10-CM | POA: Diagnosis not present

## 2018-12-18 DIAGNOSIS — Z8673 Personal history of transient ischemic attack (TIA), and cerebral infarction without residual deficits: Secondary | ICD-10-CM | POA: Diagnosis not present

## 2018-12-21 DIAGNOSIS — R531 Weakness: Secondary | ICD-10-CM | POA: Diagnosis not present

## 2018-12-21 DIAGNOSIS — I10 Essential (primary) hypertension: Secondary | ICD-10-CM | POA: Diagnosis not present

## 2018-12-21 DIAGNOSIS — K5901 Slow transit constipation: Secondary | ICD-10-CM | POA: Diagnosis not present

## 2018-12-21 DIAGNOSIS — E876 Hypokalemia: Secondary | ICD-10-CM | POA: Diagnosis not present

## 2018-12-21 DIAGNOSIS — R079 Chest pain, unspecified: Secondary | ICD-10-CM | POA: Diagnosis not present

## 2018-12-22 DIAGNOSIS — I480 Paroxysmal atrial fibrillation: Secondary | ICD-10-CM | POA: Diagnosis not present

## 2018-12-22 DIAGNOSIS — L959 Vasculitis limited to the skin, unspecified: Secondary | ICD-10-CM | POA: Diagnosis not present

## 2018-12-22 DIAGNOSIS — I2510 Atherosclerotic heart disease of native coronary artery without angina pectoris: Secondary | ICD-10-CM | POA: Diagnosis not present

## 2018-12-22 DIAGNOSIS — I10 Essential (primary) hypertension: Secondary | ICD-10-CM | POA: Diagnosis not present

## 2018-12-22 DIAGNOSIS — M1990 Unspecified osteoarthritis, unspecified site: Secondary | ICD-10-CM | POA: Diagnosis not present

## 2018-12-22 DIAGNOSIS — Z8673 Personal history of transient ischemic attack (TIA), and cerebral infarction without residual deficits: Secondary | ICD-10-CM | POA: Diagnosis not present

## 2018-12-22 DIAGNOSIS — Z9181 History of falling: Secondary | ICD-10-CM | POA: Diagnosis not present

## 2018-12-22 DIAGNOSIS — Z7982 Long term (current) use of aspirin: Secondary | ICD-10-CM | POA: Diagnosis not present

## 2018-12-22 DIAGNOSIS — G2581 Restless legs syndrome: Secondary | ICD-10-CM | POA: Diagnosis not present

## 2018-12-22 DIAGNOSIS — Z7902 Long term (current) use of antithrombotics/antiplatelets: Secondary | ICD-10-CM | POA: Diagnosis not present

## 2018-12-22 DIAGNOSIS — J45909 Unspecified asthma, uncomplicated: Secondary | ICD-10-CM | POA: Diagnosis not present

## 2018-12-22 DIAGNOSIS — M6282 Rhabdomyolysis: Secondary | ICD-10-CM | POA: Diagnosis not present

## 2018-12-23 DIAGNOSIS — I2510 Atherosclerotic heart disease of native coronary artery without angina pectoris: Secondary | ICD-10-CM | POA: Diagnosis not present

## 2018-12-23 DIAGNOSIS — I10 Essential (primary) hypertension: Secondary | ICD-10-CM | POA: Diagnosis not present

## 2018-12-23 DIAGNOSIS — M6282 Rhabdomyolysis: Secondary | ICD-10-CM | POA: Diagnosis not present

## 2018-12-26 DIAGNOSIS — I480 Paroxysmal atrial fibrillation: Secondary | ICD-10-CM | POA: Diagnosis not present

## 2018-12-26 DIAGNOSIS — I10 Essential (primary) hypertension: Secondary | ICD-10-CM | POA: Diagnosis not present

## 2018-12-26 DIAGNOSIS — Z9181 History of falling: Secondary | ICD-10-CM | POA: Diagnosis not present

## 2018-12-26 DIAGNOSIS — J45909 Unspecified asthma, uncomplicated: Secondary | ICD-10-CM | POA: Diagnosis not present

## 2018-12-26 DIAGNOSIS — I2510 Atherosclerotic heart disease of native coronary artery without angina pectoris: Secondary | ICD-10-CM | POA: Diagnosis not present

## 2018-12-26 DIAGNOSIS — G2581 Restless legs syndrome: Secondary | ICD-10-CM | POA: Diagnosis not present

## 2018-12-26 DIAGNOSIS — M6282 Rhabdomyolysis: Secondary | ICD-10-CM | POA: Diagnosis not present

## 2018-12-26 DIAGNOSIS — L959 Vasculitis limited to the skin, unspecified: Secondary | ICD-10-CM | POA: Diagnosis not present

## 2018-12-26 DIAGNOSIS — Z8673 Personal history of transient ischemic attack (TIA), and cerebral infarction without residual deficits: Secondary | ICD-10-CM | POA: Diagnosis not present

## 2018-12-26 DIAGNOSIS — Z7902 Long term (current) use of antithrombotics/antiplatelets: Secondary | ICD-10-CM | POA: Diagnosis not present

## 2018-12-26 DIAGNOSIS — Z7982 Long term (current) use of aspirin: Secondary | ICD-10-CM | POA: Diagnosis not present

## 2018-12-26 DIAGNOSIS — M1990 Unspecified osteoarthritis, unspecified site: Secondary | ICD-10-CM | POA: Diagnosis not present

## 2018-12-28 DIAGNOSIS — Z7982 Long term (current) use of aspirin: Secondary | ICD-10-CM | POA: Diagnosis not present

## 2018-12-28 DIAGNOSIS — I2510 Atherosclerotic heart disease of native coronary artery without angina pectoris: Secondary | ICD-10-CM | POA: Diagnosis not present

## 2018-12-28 DIAGNOSIS — Z7902 Long term (current) use of antithrombotics/antiplatelets: Secondary | ICD-10-CM | POA: Diagnosis not present

## 2018-12-28 DIAGNOSIS — G2581 Restless legs syndrome: Secondary | ICD-10-CM | POA: Diagnosis not present

## 2018-12-28 DIAGNOSIS — I10 Essential (primary) hypertension: Secondary | ICD-10-CM | POA: Diagnosis not present

## 2018-12-28 DIAGNOSIS — Z9181 History of falling: Secondary | ICD-10-CM | POA: Diagnosis not present

## 2018-12-28 DIAGNOSIS — M1990 Unspecified osteoarthritis, unspecified site: Secondary | ICD-10-CM | POA: Diagnosis not present

## 2018-12-28 DIAGNOSIS — M6282 Rhabdomyolysis: Secondary | ICD-10-CM | POA: Diagnosis not present

## 2018-12-28 DIAGNOSIS — J45909 Unspecified asthma, uncomplicated: Secondary | ICD-10-CM | POA: Diagnosis not present

## 2018-12-28 DIAGNOSIS — I480 Paroxysmal atrial fibrillation: Secondary | ICD-10-CM | POA: Diagnosis not present

## 2018-12-28 DIAGNOSIS — L959 Vasculitis limited to the skin, unspecified: Secondary | ICD-10-CM | POA: Diagnosis not present

## 2018-12-28 DIAGNOSIS — Z8673 Personal history of transient ischemic attack (TIA), and cerebral infarction without residual deficits: Secondary | ICD-10-CM | POA: Diagnosis not present

## 2019-01-04 DIAGNOSIS — K5901 Slow transit constipation: Secondary | ICD-10-CM | POA: Diagnosis not present

## 2019-01-04 DIAGNOSIS — I10 Essential (primary) hypertension: Secondary | ICD-10-CM | POA: Diagnosis not present

## 2019-01-04 DIAGNOSIS — Z7902 Long term (current) use of antithrombotics/antiplatelets: Secondary | ICD-10-CM | POA: Diagnosis not present

## 2019-01-04 DIAGNOSIS — M1990 Unspecified osteoarthritis, unspecified site: Secondary | ICD-10-CM | POA: Diagnosis not present

## 2019-01-04 DIAGNOSIS — Z8673 Personal history of transient ischemic attack (TIA), and cerebral infarction without residual deficits: Secondary | ICD-10-CM | POA: Diagnosis not present

## 2019-01-04 DIAGNOSIS — M6282 Rhabdomyolysis: Secondary | ICD-10-CM | POA: Diagnosis not present

## 2019-01-04 DIAGNOSIS — L959 Vasculitis limited to the skin, unspecified: Secondary | ICD-10-CM | POA: Diagnosis not present

## 2019-01-04 DIAGNOSIS — Z7982 Long term (current) use of aspirin: Secondary | ICD-10-CM | POA: Diagnosis not present

## 2019-01-04 DIAGNOSIS — I480 Paroxysmal atrial fibrillation: Secondary | ICD-10-CM | POA: Diagnosis not present

## 2019-01-04 DIAGNOSIS — G2581 Restless legs syndrome: Secondary | ICD-10-CM | POA: Diagnosis not present

## 2019-01-04 DIAGNOSIS — E876 Hypokalemia: Secondary | ICD-10-CM | POA: Diagnosis not present

## 2019-01-04 DIAGNOSIS — Z9181 History of falling: Secondary | ICD-10-CM | POA: Diagnosis not present

## 2019-01-04 DIAGNOSIS — I2510 Atherosclerotic heart disease of native coronary artery without angina pectoris: Secondary | ICD-10-CM | POA: Diagnosis not present

## 2019-01-04 DIAGNOSIS — J45909 Unspecified asthma, uncomplicated: Secondary | ICD-10-CM | POA: Diagnosis not present

## 2019-01-05 DIAGNOSIS — Z7902 Long term (current) use of antithrombotics/antiplatelets: Secondary | ICD-10-CM | POA: Diagnosis not present

## 2019-01-05 DIAGNOSIS — I2510 Atherosclerotic heart disease of native coronary artery without angina pectoris: Secondary | ICD-10-CM | POA: Diagnosis not present

## 2019-01-05 DIAGNOSIS — Z8673 Personal history of transient ischemic attack (TIA), and cerebral infarction without residual deficits: Secondary | ICD-10-CM | POA: Diagnosis not present

## 2019-01-05 DIAGNOSIS — J45909 Unspecified asthma, uncomplicated: Secondary | ICD-10-CM | POA: Diagnosis not present

## 2019-01-05 DIAGNOSIS — M1990 Unspecified osteoarthritis, unspecified site: Secondary | ICD-10-CM | POA: Diagnosis not present

## 2019-01-05 DIAGNOSIS — I480 Paroxysmal atrial fibrillation: Secondary | ICD-10-CM | POA: Diagnosis not present

## 2019-01-05 DIAGNOSIS — Z7982 Long term (current) use of aspirin: Secondary | ICD-10-CM | POA: Diagnosis not present

## 2019-01-05 DIAGNOSIS — G2581 Restless legs syndrome: Secondary | ICD-10-CM | POA: Diagnosis not present

## 2019-01-05 DIAGNOSIS — I10 Essential (primary) hypertension: Secondary | ICD-10-CM | POA: Diagnosis not present

## 2019-01-05 DIAGNOSIS — L959 Vasculitis limited to the skin, unspecified: Secondary | ICD-10-CM | POA: Diagnosis not present

## 2019-01-05 DIAGNOSIS — M6282 Rhabdomyolysis: Secondary | ICD-10-CM | POA: Diagnosis not present

## 2019-01-05 DIAGNOSIS — Z9181 History of falling: Secondary | ICD-10-CM | POA: Diagnosis not present

## 2019-01-08 ENCOUNTER — Other Ambulatory Visit: Payer: Self-pay

## 2019-01-08 ENCOUNTER — Encounter: Payer: Self-pay | Admitting: Podiatry

## 2019-01-08 ENCOUNTER — Ambulatory Visit (INDEPENDENT_AMBULATORY_CARE_PROVIDER_SITE_OTHER): Payer: Medicare Other | Admitting: Podiatry

## 2019-01-08 VITALS — BP 122/70 | HR 73 | Temp 98.5°F

## 2019-01-08 DIAGNOSIS — M79675 Pain in left toe(s): Secondary | ICD-10-CM | POA: Diagnosis not present

## 2019-01-08 DIAGNOSIS — G2581 Restless legs syndrome: Secondary | ICD-10-CM | POA: Diagnosis not present

## 2019-01-08 DIAGNOSIS — M1990 Unspecified osteoarthritis, unspecified site: Secondary | ICD-10-CM | POA: Diagnosis not present

## 2019-01-08 DIAGNOSIS — I480 Paroxysmal atrial fibrillation: Secondary | ICD-10-CM | POA: Diagnosis not present

## 2019-01-08 DIAGNOSIS — Z9181 History of falling: Secondary | ICD-10-CM | POA: Diagnosis not present

## 2019-01-08 DIAGNOSIS — M79674 Pain in right toe(s): Secondary | ICD-10-CM

## 2019-01-08 DIAGNOSIS — B351 Tinea unguium: Secondary | ICD-10-CM

## 2019-01-08 DIAGNOSIS — I10 Essential (primary) hypertension: Secondary | ICD-10-CM | POA: Diagnosis not present

## 2019-01-08 DIAGNOSIS — L959 Vasculitis limited to the skin, unspecified: Secondary | ICD-10-CM | POA: Diagnosis not present

## 2019-01-08 DIAGNOSIS — Z7902 Long term (current) use of antithrombotics/antiplatelets: Secondary | ICD-10-CM | POA: Diagnosis not present

## 2019-01-08 DIAGNOSIS — J45909 Unspecified asthma, uncomplicated: Secondary | ICD-10-CM | POA: Diagnosis not present

## 2019-01-08 DIAGNOSIS — Z8673 Personal history of transient ischemic attack (TIA), and cerebral infarction without residual deficits: Secondary | ICD-10-CM | POA: Diagnosis not present

## 2019-01-08 DIAGNOSIS — M6282 Rhabdomyolysis: Secondary | ICD-10-CM | POA: Diagnosis not present

## 2019-01-08 DIAGNOSIS — I2510 Atherosclerotic heart disease of native coronary artery without angina pectoris: Secondary | ICD-10-CM | POA: Diagnosis not present

## 2019-01-08 DIAGNOSIS — Z7982 Long term (current) use of aspirin: Secondary | ICD-10-CM | POA: Diagnosis not present

## 2019-01-08 NOTE — Patient Instructions (Signed)

## 2019-01-10 ENCOUNTER — Telehealth: Payer: Self-pay | Admitting: Podiatry

## 2019-01-10 NOTE — Telephone Encounter (Signed)
Ardi with [employer] is requesting notes from date of service 13 July be faxed to them at [phone].

## 2019-01-11 DIAGNOSIS — R269 Unspecified abnormalities of gait and mobility: Secondary | ICD-10-CM | POA: Diagnosis not present

## 2019-01-11 DIAGNOSIS — Z7982 Long term (current) use of aspirin: Secondary | ICD-10-CM | POA: Diagnosis not present

## 2019-01-11 DIAGNOSIS — L959 Vasculitis limited to the skin, unspecified: Secondary | ICD-10-CM | POA: Diagnosis not present

## 2019-01-11 DIAGNOSIS — M6282 Rhabdomyolysis: Secondary | ICD-10-CM | POA: Diagnosis not present

## 2019-01-11 DIAGNOSIS — J45909 Unspecified asthma, uncomplicated: Secondary | ICD-10-CM | POA: Diagnosis not present

## 2019-01-11 DIAGNOSIS — I2510 Atherosclerotic heart disease of native coronary artery without angina pectoris: Secondary | ICD-10-CM | POA: Diagnosis not present

## 2019-01-11 DIAGNOSIS — Z9181 History of falling: Secondary | ICD-10-CM | POA: Diagnosis not present

## 2019-01-11 DIAGNOSIS — Z8673 Personal history of transient ischemic attack (TIA), and cerebral infarction without residual deficits: Secondary | ICD-10-CM | POA: Diagnosis not present

## 2019-01-11 DIAGNOSIS — M1990 Unspecified osteoarthritis, unspecified site: Secondary | ICD-10-CM | POA: Diagnosis not present

## 2019-01-11 DIAGNOSIS — G2581 Restless legs syndrome: Secondary | ICD-10-CM | POA: Diagnosis not present

## 2019-01-11 DIAGNOSIS — I480 Paroxysmal atrial fibrillation: Secondary | ICD-10-CM | POA: Diagnosis not present

## 2019-01-11 DIAGNOSIS — S60812A Abrasion of left wrist, initial encounter: Secondary | ICD-10-CM | POA: Diagnosis not present

## 2019-01-11 DIAGNOSIS — Z7902 Long term (current) use of antithrombotics/antiplatelets: Secondary | ICD-10-CM | POA: Diagnosis not present

## 2019-01-11 DIAGNOSIS — K5901 Slow transit constipation: Secondary | ICD-10-CM | POA: Diagnosis not present

## 2019-01-11 DIAGNOSIS — E876 Hypokalemia: Secondary | ICD-10-CM | POA: Diagnosis not present

## 2019-01-11 DIAGNOSIS — I10 Essential (primary) hypertension: Secondary | ICD-10-CM | POA: Diagnosis not present

## 2019-01-11 NOTE — Progress Notes (Signed)
Subjective: Kimberly Bass presents today referred by Deland Pretty, MD with cc of painful, discolored, thick toenails which interfere with daily activities.  Pain is aggravated when wearing enclosed shoe gear.   She is a resident at [facility].  Past Medical History:  Diagnosis Date  . Arthritis    "mild; in all my joints" (04/27/2017)  . Coronary artery disease   . Cystitis with hematuria 04/26/2017  . Gait disorder   . H/O radioactive iodine thyroid ablation   . Hx of transient ischemic attack (TIA)   . Hyperlipidemia   . Hypertension    "not since MI in 2007" (04/27/2017)  . Hyperthyroidism    H/O radioactive iodine thyroid ablation [Z92.3]  . Hypothyroidism   . Lower extremity myoclonus   . Lumbosacral spondylosis   . Metabolic encephalopathy   . Migraine    "I've had one in my lifetime" (04/27/2017)  . Mild obesity   . Myocardial infarction ([place]) 12/2005  . Myoclonus    Lower extremity involvement  . Obesity   . Pneumonia    "lots when I was a small child; not at all since" (04/27/2017)  . Rhabdomyolysis   . Seizures ([place])    History is questionable  . Stroke ([place])   . Thyroid disease      Patient Active Problem List   Diagnosis Date Noted  . Chest pain, rule out acute myocardial infarction 12/10/2018  . 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 Asthmatic bronchitis 09/04/2018  . Syncope 09/01/2018  . PAF (paroxysmal atrial fibrillation) ([place]) 09/01/2018  . Traumatic hematoma of knee, left, initial encounter 09/01/2018  . RLS (restless legs syndrome) 09/01/2018  . Bradycardia 08/30/2018  . Elevated troponin 08/30/2018  . Rhabdomyolysis 08/29/2018  . Hypomagnesemia   . Pyuria 08/01/2018  . AMS (altered mental status) 08/01/2018  . Altered mental status 08/01/2018  . Hypothyroidism 12/12/2017  . Acute encephalopathy 12/12/2017  . Essential hypertension 12/12/2017  . Hypokalemia 12/12/2017  . Hyperlipidemia 05/05/2017  . Urinary tract infection  with hematuria 04/27/2017  . Tremor, essential 11/03/2015  . 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 Abnormality of gait 08/15/2012  . Essential and other specified forms of tremor 08/15/2012  . Degeneration of lumbar or lumbosacral intervertebral disc 08/15/2012  . Hx of low back pain 08/15/2012  . CAD (coronary artery disease) 12/23/2010  . Hx of transient ischemic attack (TIA)   . CONSTIPATION 02/27/2009  . PERSONAL HX COLONIC POLYPS 02/27/2009     Past Surgical History:  Procedure Laterality Date  . ABDOMINAL HYSTERECTOMY  1972   for uterine fibroids/notes 11/10/2010  . ANTERIOR CERVICAL DECOMP/DISCECTOMY FUSION  03/2004   Archie Endo 11/10/2010  . APPENDECTOMY    . BACK SURGERY    . BREAST BIOPSY Right 1996   nodule resected/notes 11/10/2010  . CARDIAC CATHETERIZATION  01/03/2006  . CATARACT EXTRACTION W/ INTRAOCULAR LENS  IMPLANT, BILATERAL Bilateral   . COLONOSCOPY  11/2003   Archie Endo 11/10/2010  . ESOPHAGOGASTRODUODENOSCOPY  09/2006   Archie Endo 11/10/2010  . LUMBAR DISC SURGERY  1990s X 1; 05/2003   "a nerve was pinching"; Archie Endo 11/10/2010  . OVARY SURGERY Left 1999   benign tumor resected /notes 11/10/2010  . TONSILLECTOMY  ~ 1947     Current Outpatient Medications:  .  acetaminophen (TYLENOL ARTHRITIS PAIN) 650 MG CR tablet, Take 1,300 mg by mouth 2 (two) times a day. , Disp: , Rfl:  .  amLODipine (NORVASC) 5 MG tablet, Take 5 mg by mouth daily., Disp: , Rfl:  .  aspirin EC 81 MG tablet, Take 81 mg by mouth daily. , Disp: , Rfl:  .  calcium-vitamin D (CALCIUM 500+D HIGH POTENCY) 500-400 MG-UNIT tablet, Take 1 tablet by mouth daily. , Disp: , Rfl:  .  clopidogrel (PLAVIX) 75 MG tablet, TAKE 1 TABLET BY MOUTH  DAILY (Patient taking differently: Take 75 mg by mouth daily. ), Disp: 90 tablet, Rfl: 3 .  Cyanocobalamin (VITAMIN B-12) 2500 MCG SUBL, Place 2,500 mcg under the tongue daily., Disp: , Rfl:  .  Ensure (ENSURE), Take 237 mLs by mouth 2 (two) times daily as needed (for supplementation).,  Disp: , Rfl:  .  gabapentin (NEURONTIN) 400 MG capsule, TAKE 1 CAPSULE (400 MG TOTAL) BY MOUTH 4 (FOUR) TIMES DAILY., Disp: 360 capsule, Rfl: 3 .  ipratropium-albuterol (DUONEB) 0.5-2.5 (3) MG/3ML SOLN, Take 3 mLs by nebulization. INHALE 1 VIAL VIA NEBULIZER EVERY 6 HOURS AS NEEDED FOR WHEEZING, Disp: , Rfl:  .  isosorbide mononitrate (IMDUR) 30 MG 24 hr tablet, Take 0.5 tablets (15 mg total) by mouth daily., Disp: 30 tablet, Rfl: 0 .  levothyroxine (SYNTHROID, LEVOTHROID) 100 MCG tablet, Take 100 mcg by mouth daily before breakfast., Disp: , Rfl:  .  magnesium hydroxide (MILK OF MAGNESIA) 400 MG/5ML suspension, Constipation (1 of 4): If no BM in 3 days, give 30 cc Milk of Magnesium p.o. x 1 dose in 24 hours as needed (Do not use standing constipation orders for residents with renal failure CFR less than 30. Contact MD for orders), Disp: , Rfl:  .  metoprolol tartrate (LOPRESSOR) 25 MG tablet, Take 1 tablet (25 mg total) by mouth 2 (two) times daily., Disp: 60 tablet, Rfl: 0 .  mirabegron ER (MYRBETRIQ) 50 MG TB24 tablet, Take 50 mg by mouth daily., Disp: , Rfl:  .  Multiple Vitamin (MULTIVITAMIN WITH MINERALS) TABS tablet, Take 1 tablet by mouth daily., Disp: , Rfl:  .  NITROSTAT 0.4 MG SL tablet, DISSOLVE 1 TAB UNDER THE  TONGUE EVERY 5 MINUTES AS  NEEDED FOR CHEST PAIN. MAX  3 TOTAL DOSES/15MIN. CALL  911 IF NO RELIEF (Patient taking differently: Place 0.4 mg under the tongue every 5 (five) minutes x 3 doses as needed for chest pain (AND CALL 9-1-1-, IF NO RELIEF). ), Disp: 100 tablet, Rfl: 0 .  NON FORMULARY, Mighty Shake (chocolate if available) Drink 1 shake by mouth two times a day, Disp: , Rfl:  .  pantoprazole (PROTONIX) 20 MG tablet, Take 20 mg by mouth daily before breakfast. DO NOT CRUSH, Disp: , Rfl:  .  simvastatin (ZOCOR) 20 MG tablet, Take 20 mg by mouth at bedtime. , Disp: , Rfl:    Allergies  Allergen Reactions  . Penicillins Shortness Of Breath, Nausea Only and Rash    Has patient  had a PCN reaction causing immediate rash, facial/tongue/throat swelling, SOB or lightheadedness with hypotension: Yes Has patient had a PCN reaction causing severe rash involving mucus membranes or skin necrosis: Yes Has patient had a PCN reaction that required hospitalization: Yes Has patient had a PCN reaction occurring within the last 10 years: >50 years ago If all of the above answers are "NO", then may proceed with Cephalosporin use.   . Codeine Nausea Only  . Meperidine Hcl Nausea Only  . Morphine Nausea Only     Social History   Occupational History  . Occupation: retired    Associate Professormployer: RETIRED  Tobacco Use  . Smoking status: Never Smoker  . Smokeless tobacco: Never Used  Substance and Sexual Activity  . Alcohol use: No  . Drug use: No  . Sexual activity: Not Currently     Family History  Problem Relation Age of Onset  . Heart disease Father   . Heart attack Mother   . Stroke Sister   . Heart attack Sister   . Heart attack Brother        Possible  . Diabetes Sister      There is no immunization history on file for this patient.   Review of systems: Positive Findings in bold print.  Constitutional:  chills, fatigue, fever, sweats, weight change Communication: Nurse, learning disabilitytranslator, sign Presenter, broadcastinglanguage translator, hand writing, iPad/Android device Head: headaches, head injury Eyes: changes in vision, eye pain, glaucoma, cataracts, macular degeneration, diplopia, glare,  light sensitivity, eyeglasses or contacts, blindness Ears nose mouth throat: hearing impaired, hearing aids,  ringing in ears, deaf, sign language,  vertigo,   nosebleeds,  rhinitis,  cold sores, snoring, swollen glands Cardiovascular: HTN, edema, arrhythmia, pacemaker in place, defibrillator in place, chest pain/tightness, chronic anticoagulation, blood clot, heart failure, MI Peripheral Vascular: leg cramps, varicose veins, blood clots, lymphedema, varicosities Respiratory:  difficulty breathing, denies congestion,  SOB, wheezing, cough, emphysema Gastrointestinal: change in appetite or weight, abdominal pain, constipation, diarrhea, nausea, vomiting, vomiting blood, change in bowel habits, abdominal pain, jaundice, rectal bleeding, hemorrhoids, GERD Genitourinary:  nocturia,  pain on urination, polyuria,  blood in urine, Foley catheter, urinary urgency, ESRD on hemodialysis Musculoskeletal: amputation, cramping, stiff joints, painful joints, decreased joint motion, fractures, OA, gout, hemiplegia, paraplegia, uses cane, wheelchair bound, uses walker, uses rollator Skin: +changes in toenails, color change, dryness, itching, mole changes,  rash, wound(s) Neurological: headaches, numbness in feet, paresthesias in feet, burning in feet, fainting,  seizures, change in speech. denies headaches, memory problems/poor historian, cerebral palsy, weakness, paralysis, CVA, TIA Endocrine: diabetes, hypothyroidism, hyperthyroidism,  goiter, dry mouth, flushing, heat intolerance,  cold intolerance,  excessive thirst, denies polyuria,  nocturia Hematological:  easy bleeding, excessive bleeding, easy bruising, enlarged lymph nodes, on long term blood thinner, history of past transusions Allergy/immunological:  hives, eczema, frequent infections, multiple drug allergies, seasonal allergies, transplant recipient, multiple food allergies Psychiatric:  anxiety, depression, mood disorder, suicidal ideations, hallucinations, insomnia  Objective: Vitals:   01/08/19 1420  BP: 122/70  Pulse: 73  Temp: 98.5 F (36.9 C)   Vascular Examination: Capillary refill time immediate x 10 digits.  Dorsalis pedis pulses palpable b/l.   Posterior tibial pulses palpable b/l.   No digital hair x 10 digits.  Skin temperature gradient WNL b/l  Dermatological Examination: Skin with normal turgor, texture and tone b/l.  Toenails 1-5 b/l discolored, thick, dystrophic with subungual debris and pain with palpation to nailbeds due to  thickness of nails.  Well healed surgical scar dorsal aspect 3rd webspace left foot.  Musculoskeletal: Muscle strength 5/5 to all LE muscle groups.  Hammertoes 2-5 b/l.  HAV with bunion b/l.  Neurological: Sensation intact with 10 gram monofilament.  Vibratory sensation intact.  Assessment: 1. Painful onychomycosis toenails 1-5 b/l   Plan: 1. Discussed onychomycosis and treatment options.  Literature dispensed on today. 2. Toenails 1-5 b/l were debrided in length and girth without iatrogenic bleeding. 3. Patient to continue soft, supportive shoe gear daily. 4. Patient to report any pedal injuries to medical professional immediately. 5. Follow up 3 months.  6. Patient/POA to call should there be a concern in the interim.

## 2019-01-15 DIAGNOSIS — G2581 Restless legs syndrome: Secondary | ICD-10-CM | POA: Diagnosis not present

## 2019-01-15 DIAGNOSIS — J45909 Unspecified asthma, uncomplicated: Secondary | ICD-10-CM | POA: Diagnosis not present

## 2019-01-15 DIAGNOSIS — E876 Hypokalemia: Secondary | ICD-10-CM | POA: Diagnosis not present

## 2019-01-15 DIAGNOSIS — Z7982 Long term (current) use of aspirin: Secondary | ICD-10-CM | POA: Diagnosis not present

## 2019-01-15 DIAGNOSIS — M6282 Rhabdomyolysis: Secondary | ICD-10-CM | POA: Diagnosis not present

## 2019-01-15 DIAGNOSIS — L959 Vasculitis limited to the skin, unspecified: Secondary | ICD-10-CM | POA: Diagnosis not present

## 2019-01-15 DIAGNOSIS — I480 Paroxysmal atrial fibrillation: Secondary | ICD-10-CM | POA: Diagnosis not present

## 2019-01-15 DIAGNOSIS — I2510 Atherosclerotic heart disease of native coronary artery without angina pectoris: Secondary | ICD-10-CM | POA: Diagnosis not present

## 2019-01-15 DIAGNOSIS — Z8673 Personal history of transient ischemic attack (TIA), and cerebral infarction without residual deficits: Secondary | ICD-10-CM | POA: Diagnosis not present

## 2019-01-15 DIAGNOSIS — I10 Essential (primary) hypertension: Secondary | ICD-10-CM | POA: Diagnosis not present

## 2019-01-15 DIAGNOSIS — M1990 Unspecified osteoarthritis, unspecified site: Secondary | ICD-10-CM | POA: Diagnosis not present

## 2019-01-15 DIAGNOSIS — Z9181 History of falling: Secondary | ICD-10-CM | POA: Diagnosis not present

## 2019-01-15 DIAGNOSIS — Z79899 Other long term (current) drug therapy: Secondary | ICD-10-CM | POA: Diagnosis not present

## 2019-01-15 DIAGNOSIS — Z7902 Long term (current) use of antithrombotics/antiplatelets: Secondary | ICD-10-CM | POA: Diagnosis not present

## 2019-01-17 DIAGNOSIS — Z9181 History of falling: Secondary | ICD-10-CM | POA: Diagnosis not present

## 2019-01-17 DIAGNOSIS — Z7982 Long term (current) use of aspirin: Secondary | ICD-10-CM | POA: Diagnosis not present

## 2019-01-17 DIAGNOSIS — M6282 Rhabdomyolysis: Secondary | ICD-10-CM | POA: Diagnosis not present

## 2019-01-17 DIAGNOSIS — L959 Vasculitis limited to the skin, unspecified: Secondary | ICD-10-CM | POA: Diagnosis not present

## 2019-01-17 DIAGNOSIS — I2510 Atherosclerotic heart disease of native coronary artery without angina pectoris: Secondary | ICD-10-CM | POA: Diagnosis not present

## 2019-01-17 DIAGNOSIS — Z8673 Personal history of transient ischemic attack (TIA), and cerebral infarction without residual deficits: Secondary | ICD-10-CM | POA: Diagnosis not present

## 2019-01-17 DIAGNOSIS — M1990 Unspecified osteoarthritis, unspecified site: Secondary | ICD-10-CM | POA: Diagnosis not present

## 2019-01-17 DIAGNOSIS — I10 Essential (primary) hypertension: Secondary | ICD-10-CM | POA: Diagnosis not present

## 2019-01-17 DIAGNOSIS — J45909 Unspecified asthma, uncomplicated: Secondary | ICD-10-CM | POA: Diagnosis not present

## 2019-01-17 DIAGNOSIS — Z7902 Long term (current) use of antithrombotics/antiplatelets: Secondary | ICD-10-CM | POA: Diagnosis not present

## 2019-01-17 DIAGNOSIS — G2581 Restless legs syndrome: Secondary | ICD-10-CM | POA: Diagnosis not present

## 2019-01-17 DIAGNOSIS — I480 Paroxysmal atrial fibrillation: Secondary | ICD-10-CM | POA: Diagnosis not present

## 2019-02-01 DIAGNOSIS — E876 Hypokalemia: Secondary | ICD-10-CM | POA: Diagnosis not present

## 2019-02-01 DIAGNOSIS — I10 Essential (primary) hypertension: Secondary | ICD-10-CM | POA: Diagnosis not present

## 2019-03-01 DIAGNOSIS — N3281 Overactive bladder: Secondary | ICD-10-CM | POA: Diagnosis not present

## 2019-03-01 DIAGNOSIS — K5901 Slow transit constipation: Secondary | ICD-10-CM | POA: Diagnosis not present

## 2019-03-01 DIAGNOSIS — I10 Essential (primary) hypertension: Secondary | ICD-10-CM | POA: Diagnosis not present

## 2019-03-15 DIAGNOSIS — K219 Gastro-esophageal reflux disease without esophagitis: Secondary | ICD-10-CM | POA: Diagnosis not present

## 2019-03-15 DIAGNOSIS — E876 Hypokalemia: Secondary | ICD-10-CM | POA: Diagnosis not present

## 2019-03-15 DIAGNOSIS — I10 Essential (primary) hypertension: Secondary | ICD-10-CM | POA: Diagnosis not present

## 2019-03-15 DIAGNOSIS — K5901 Slow transit constipation: Secondary | ICD-10-CM | POA: Diagnosis not present

## 2019-03-15 DIAGNOSIS — Z79899 Other long term (current) drug therapy: Secondary | ICD-10-CM | POA: Diagnosis not present

## 2019-03-22 DIAGNOSIS — I10 Essential (primary) hypertension: Secondary | ICD-10-CM | POA: Diagnosis not present

## 2019-03-22 DIAGNOSIS — K5901 Slow transit constipation: Secondary | ICD-10-CM | POA: Diagnosis not present

## 2019-03-22 DIAGNOSIS — Z79899 Other long term (current) drug therapy: Secondary | ICD-10-CM | POA: Diagnosis not present

## 2019-04-05 DIAGNOSIS — Z79899 Other long term (current) drug therapy: Secondary | ICD-10-CM | POA: Diagnosis not present

## 2019-04-05 DIAGNOSIS — K5901 Slow transit constipation: Secondary | ICD-10-CM | POA: Diagnosis not present

## 2019-04-05 DIAGNOSIS — R238 Other skin changes: Secondary | ICD-10-CM | POA: Diagnosis not present

## 2019-04-05 DIAGNOSIS — I10 Essential (primary) hypertension: Secondary | ICD-10-CM | POA: Diagnosis not present

## 2019-04-09 ENCOUNTER — Ambulatory Visit: Payer: Medicare Other | Admitting: Podiatry

## 2019-05-03 DIAGNOSIS — K5901 Slow transit constipation: Secondary | ICD-10-CM | POA: Diagnosis not present

## 2019-05-03 DIAGNOSIS — I10 Essential (primary) hypertension: Secondary | ICD-10-CM | POA: Diagnosis not present

## 2019-05-03 DIAGNOSIS — Z79899 Other long term (current) drug therapy: Secondary | ICD-10-CM | POA: Diagnosis not present

## 2019-05-03 DIAGNOSIS — R238 Other skin changes: Secondary | ICD-10-CM | POA: Diagnosis not present

## 2019-05-16 DIAGNOSIS — B351 Tinea unguium: Secondary | ICD-10-CM | POA: Diagnosis not present

## 2019-05-16 DIAGNOSIS — I739 Peripheral vascular disease, unspecified: Secondary | ICD-10-CM | POA: Diagnosis not present

## 2019-06-05 ENCOUNTER — Ambulatory Visit: Payer: Medicare Other | Admitting: Podiatry

## 2019-06-11 DIAGNOSIS — K219 Gastro-esophageal reflux disease without esophagitis: Secondary | ICD-10-CM | POA: Diagnosis not present

## 2019-06-11 DIAGNOSIS — Z79899 Other long term (current) drug therapy: Secondary | ICD-10-CM | POA: Diagnosis not present

## 2019-06-11 DIAGNOSIS — I10 Essential (primary) hypertension: Secondary | ICD-10-CM | POA: Diagnosis not present

## 2019-06-11 DIAGNOSIS — K5901 Slow transit constipation: Secondary | ICD-10-CM | POA: Diagnosis not present

## 2019-07-09 DIAGNOSIS — M25552 Pain in left hip: Secondary | ICD-10-CM | POA: Diagnosis not present

## 2019-07-09 DIAGNOSIS — K5901 Slow transit constipation: Secondary | ICD-10-CM | POA: Diagnosis not present

## 2019-07-09 DIAGNOSIS — I10 Essential (primary) hypertension: Secondary | ICD-10-CM | POA: Diagnosis not present

## 2019-07-09 DIAGNOSIS — Z79899 Other long term (current) drug therapy: Secondary | ICD-10-CM | POA: Diagnosis not present

## 2019-07-09 DIAGNOSIS — K219 Gastro-esophageal reflux disease without esophagitis: Secondary | ICD-10-CM | POA: Diagnosis not present

## 2019-07-16 DIAGNOSIS — M25552 Pain in left hip: Secondary | ICD-10-CM | POA: Diagnosis not present

## 2019-07-16 DIAGNOSIS — I10 Essential (primary) hypertension: Secondary | ICD-10-CM | POA: Diagnosis not present

## 2019-07-16 DIAGNOSIS — U071 COVID-19: Secondary | ICD-10-CM | POA: Diagnosis not present

## 2019-07-16 DIAGNOSIS — Z79899 Other long term (current) drug therapy: Secondary | ICD-10-CM | POA: Diagnosis not present

## 2019-07-20 DIAGNOSIS — Z79899 Other long term (current) drug therapy: Secondary | ICD-10-CM | POA: Diagnosis not present

## 2019-07-20 DIAGNOSIS — E039 Hypothyroidism, unspecified: Secondary | ICD-10-CM | POA: Diagnosis not present

## 2019-07-20 DIAGNOSIS — I10 Essential (primary) hypertension: Secondary | ICD-10-CM | POA: Diagnosis not present

## 2019-07-23 DIAGNOSIS — Z8616 Personal history of COVID-19: Secondary | ICD-10-CM | POA: Diagnosis not present

## 2019-07-23 DIAGNOSIS — Z79899 Other long term (current) drug therapy: Secondary | ICD-10-CM | POA: Diagnosis not present

## 2019-07-23 DIAGNOSIS — M25552 Pain in left hip: Secondary | ICD-10-CM | POA: Diagnosis not present

## 2019-07-25 IMAGING — CT CT CERVICAL SPINE W/O CM
5 of 8 series · 13 of 33 positions shown, 14 images · non-contrast
Comparison: 12/12/2017

CLINICAL DATA: Found on floor at independent living facility by
family, patient does remember falling, history coronary artery
disease post MI, hypertension, seizures,

EXAM:
CT HEAD WITHOUT CONTRAST
CT CERVICAL SPINE WITHOUT CONTRAST
TECHNIQUE: Multidetector CT imaging of the head and cervical spine was
performed following the standard protocol without intravenous
contrast. Multiplanar CT image reconstructions of the cervical spine
were also generated.

[Series 5: head bone · axial · 0.42mm/px · z∈[+1117,+1171]mm · 2 of 82 slices shown]
[im 28/82  bone]
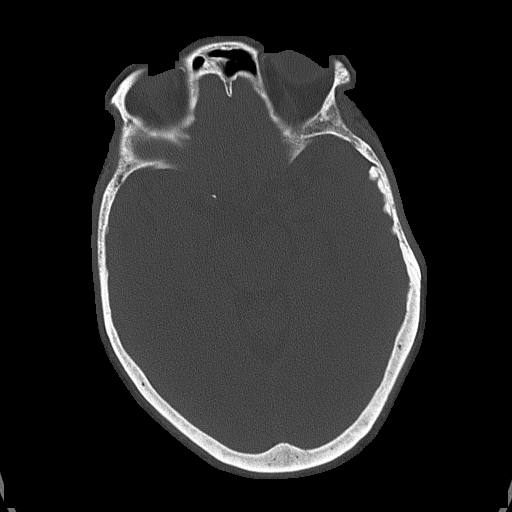
[im 55/82  bone]
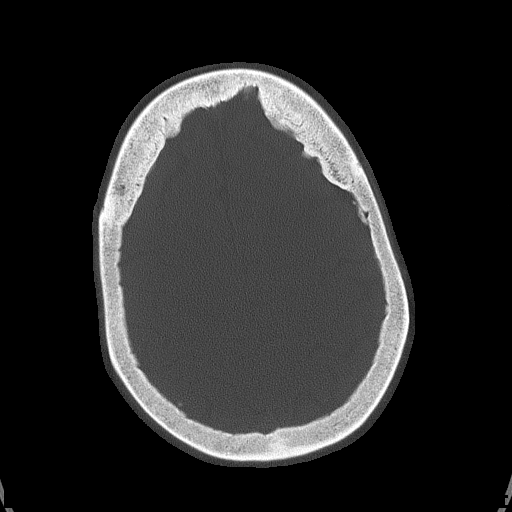

[Series 8: c_spine 2.0 st · axial · 0.39mm/px · z∈[+942,+1042]mm · 3 of 100 slices shown, 4 images]
[im 25/100  soft-tissue]
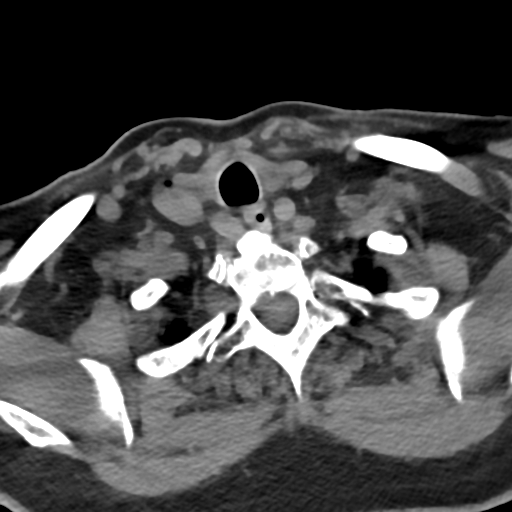
[im 25/100  bone]
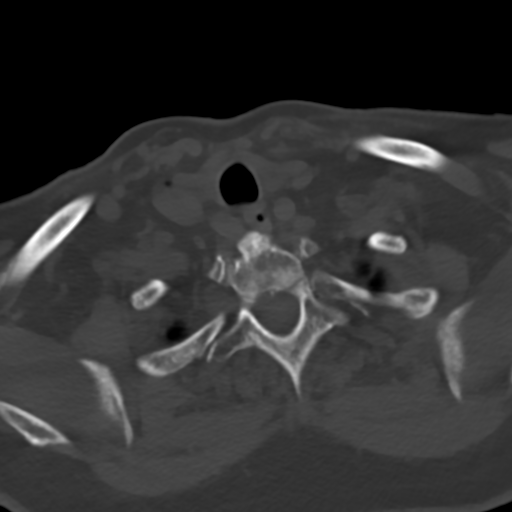
[im 50/100  bone]
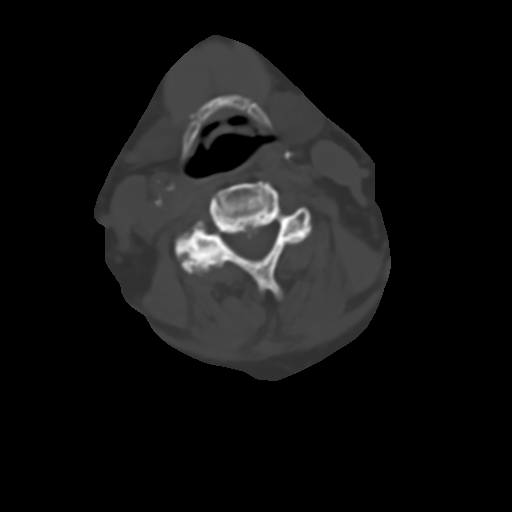
[im 75/100  bone]
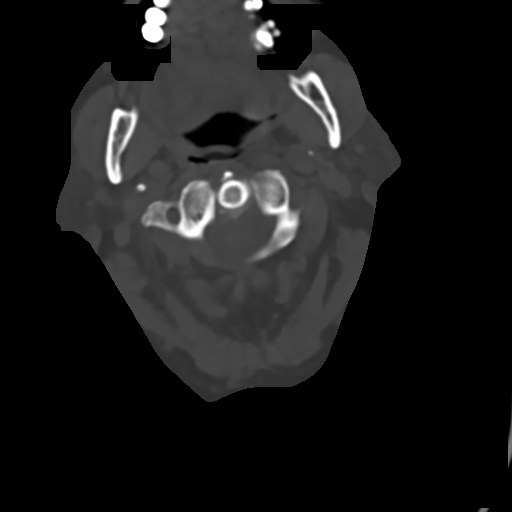

[Series 10: c_spine 2.0 sag bone · sagittal · 0.23mm/px · 5 of 61 slices shown]
[im 11/61  bone]
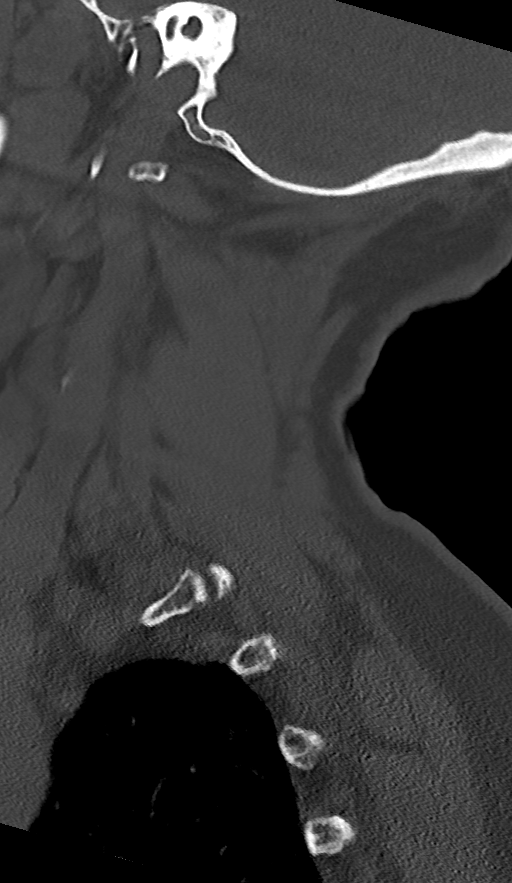
[im 21/61  bone]
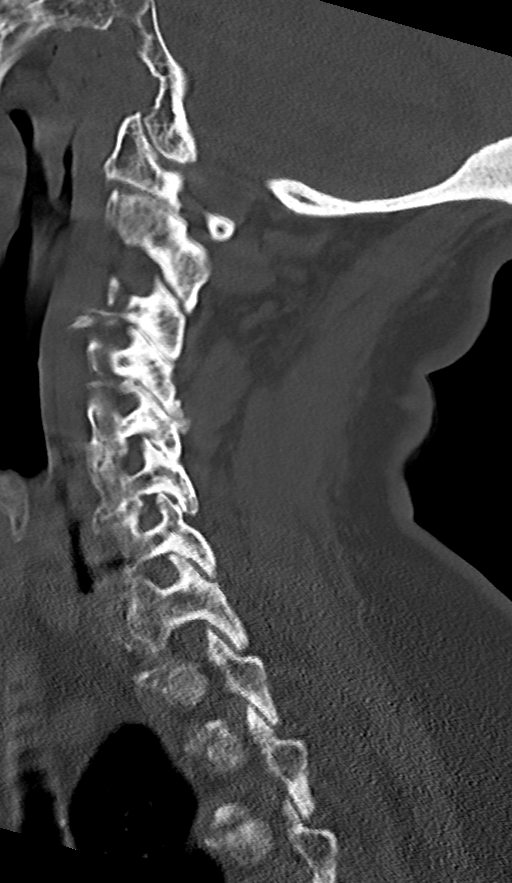
[im 31/61  bone]
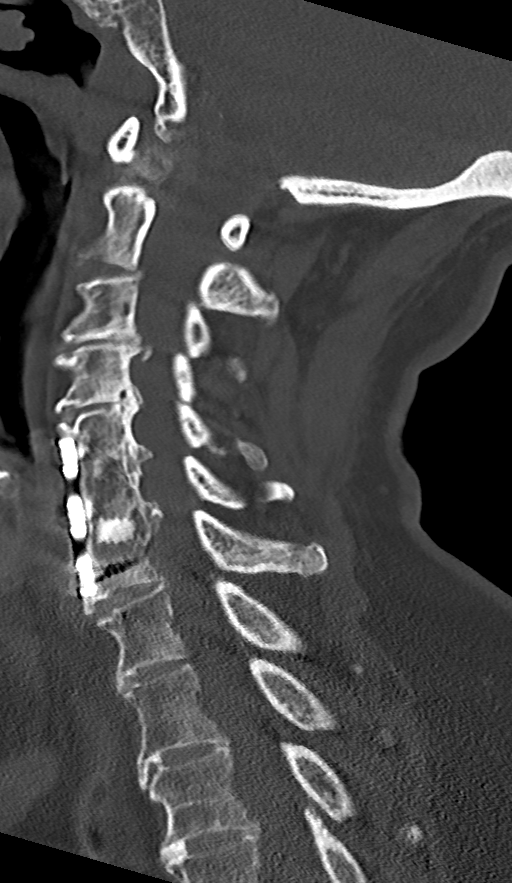
[im 41/61  bone]
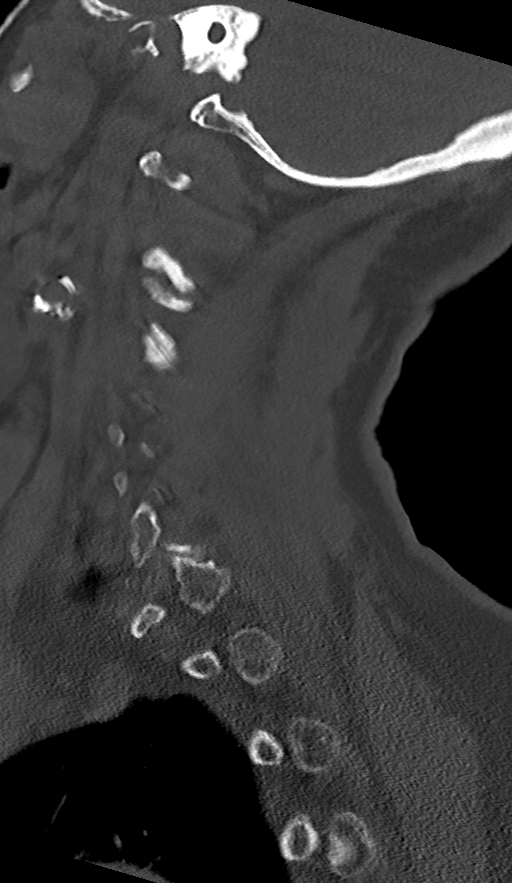
[im 51/61  bone]
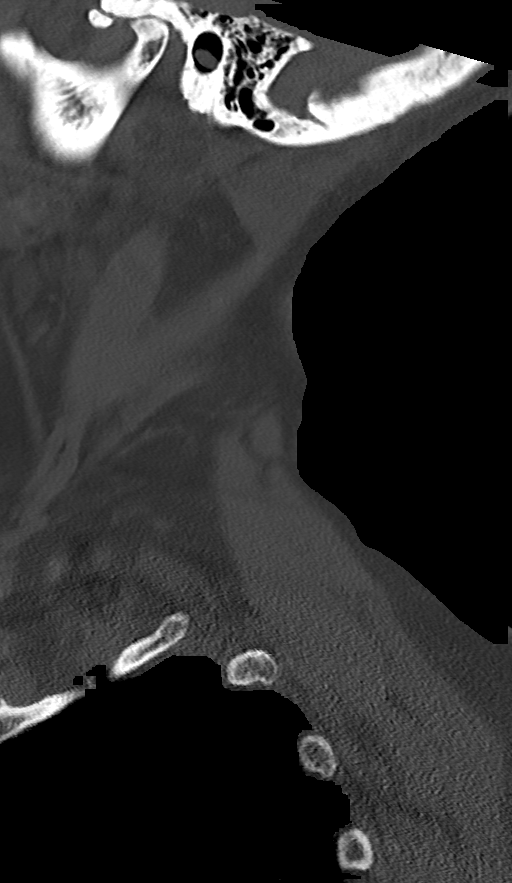

[Series 11: c_spine 2.0 cor bone · coronal · 0.21mm/px · 1 of 61 slices shown]
[im 31/61  bone]
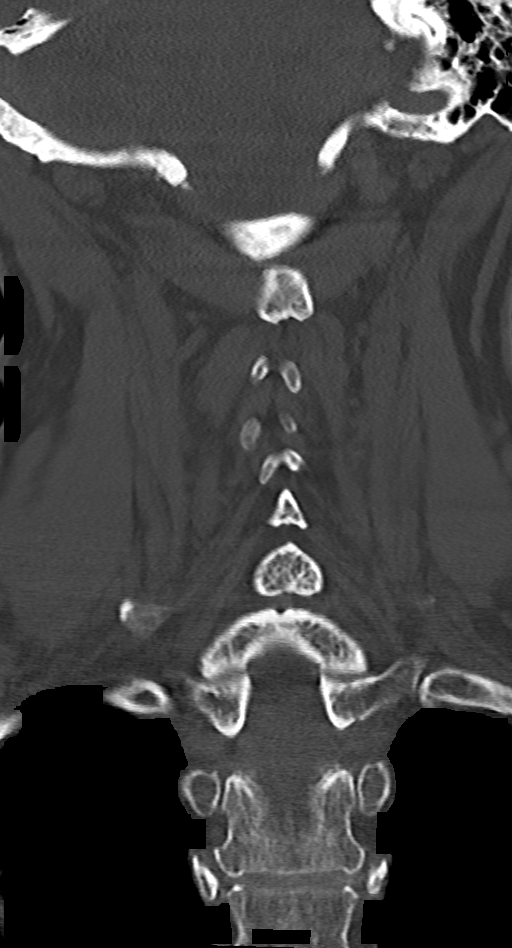

[Series 12: c_spine 2.0 orthogonals · axial · 0.21mm/px · z∈[+946,+1014]mm · 2 of 86 slices shown]
[im 29/86  bone]
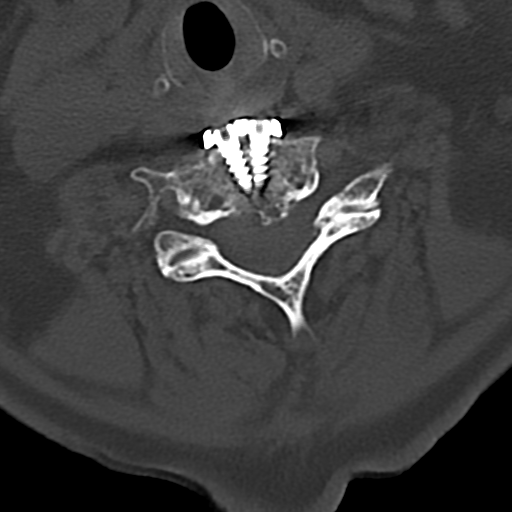
[im 57/86  bone]
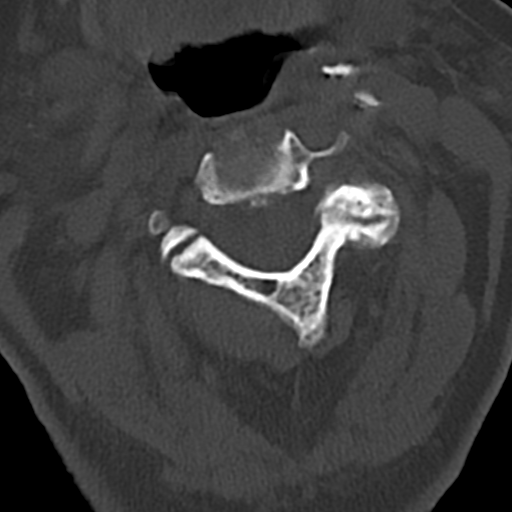

[13 of 33 positions shown; findings below may reference images not displayed]

FINDINGS: CT HEAD FINDINGS

Brain: Generalized atrophy. Normal ventricular morphology. No
midline shift or mass effect. Small vessel chronic ischemic changes
of deep cerebral white matter. No intracranial hemorrhage, mass
lesion, evidence of acute infarction, or extra-axial fluid
collection.

Vascular: Atherosclerotic calcifications of internal carotid and
vertebral arteries at skull base

Skull: Intact

Sinuses/Orbits: Mucosal retention cyst RIGHT maxillary sinus.
Otherwise clear.

Other: N/A

CT CERVICAL SPINE FINDINGS

Alignment: Normal

Skull base and vertebrae: Osseous demineralization. Visualized skull
base intact. Prior anterior fusion C5-C7. Multilevel degenerative
disc disease changes with disc space narrowing and endplate spur
formation. Multilevel facet degenerative changes. Vertebral body
heights maintained without fracture or subluxation. Minimal
scattered motion artifacts. Endplate spurring at C6-C7 causes AP
narrowing of the spinal canal and probable cord compression greater
LEFT of midline. Hardware appears intact.

Soft tissues and spinal canal: Prevertebral soft tissues normal
thickness. Atherosclerotic calcifications at carotid bifurcations.
Remaining cervical soft tissues unremarkable.

Disc levels:  As above

Upper chest: Lung apices clear

Other: N/A
IMPRESSION: Mild atrophy with small vessel chronic ischemic changes of deep
cerebral white matter.

No acute intracranial abnormalities.

Degenerative disc and facet disease changes of the cervical spine.

Prior anterior cervical fusion C5-C7.

No acute cervical spine abnormalities.

## 2019-07-27 IMAGING — DX DG KNEE COMPLETE 4+V*L*
4 series · 4 of 4 positions shown · non-contrast
Comparison: None.

CLINICAL DATA: The left knee anterior abrasions, swelling and pain
following a fall 5 days ago

EXAM:
LEFT KNEE - COMPLETE 4+ VIEW

[x knee ap left]
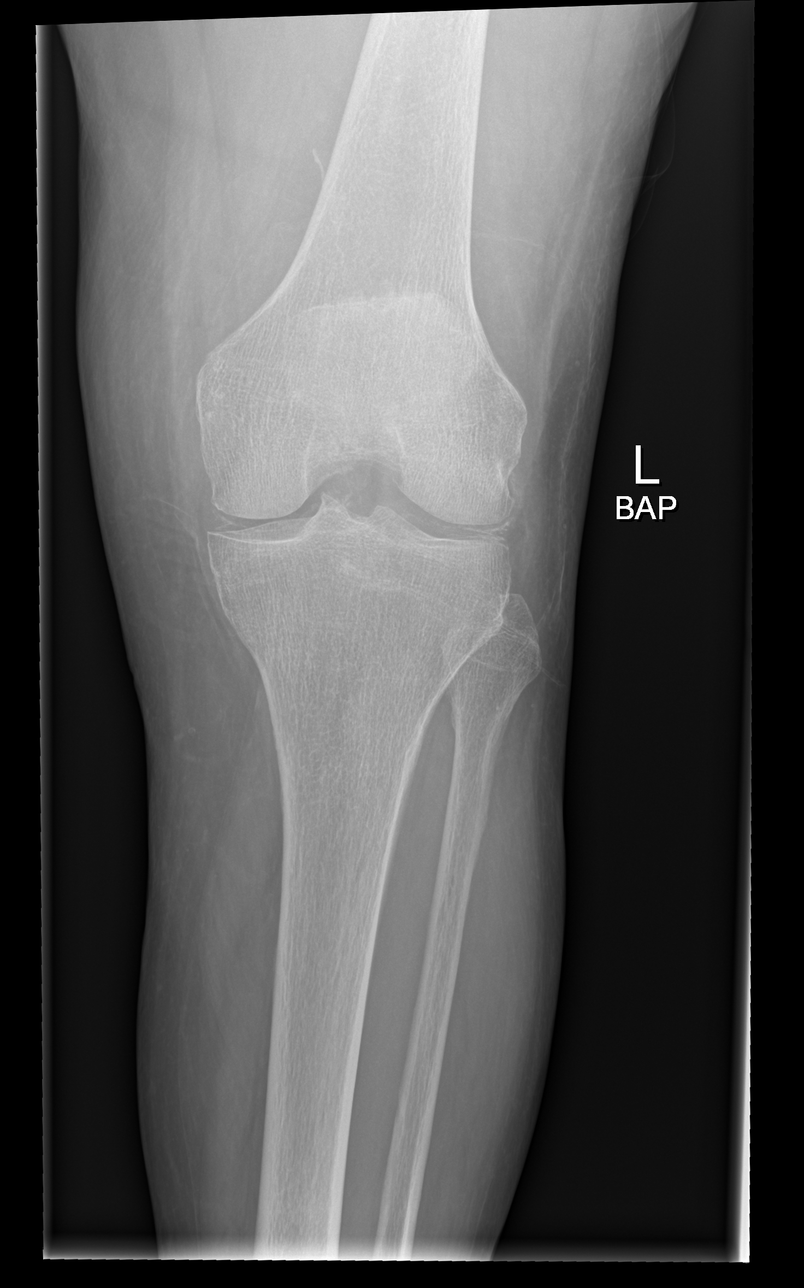

[x knee obl left (1 of 2)]
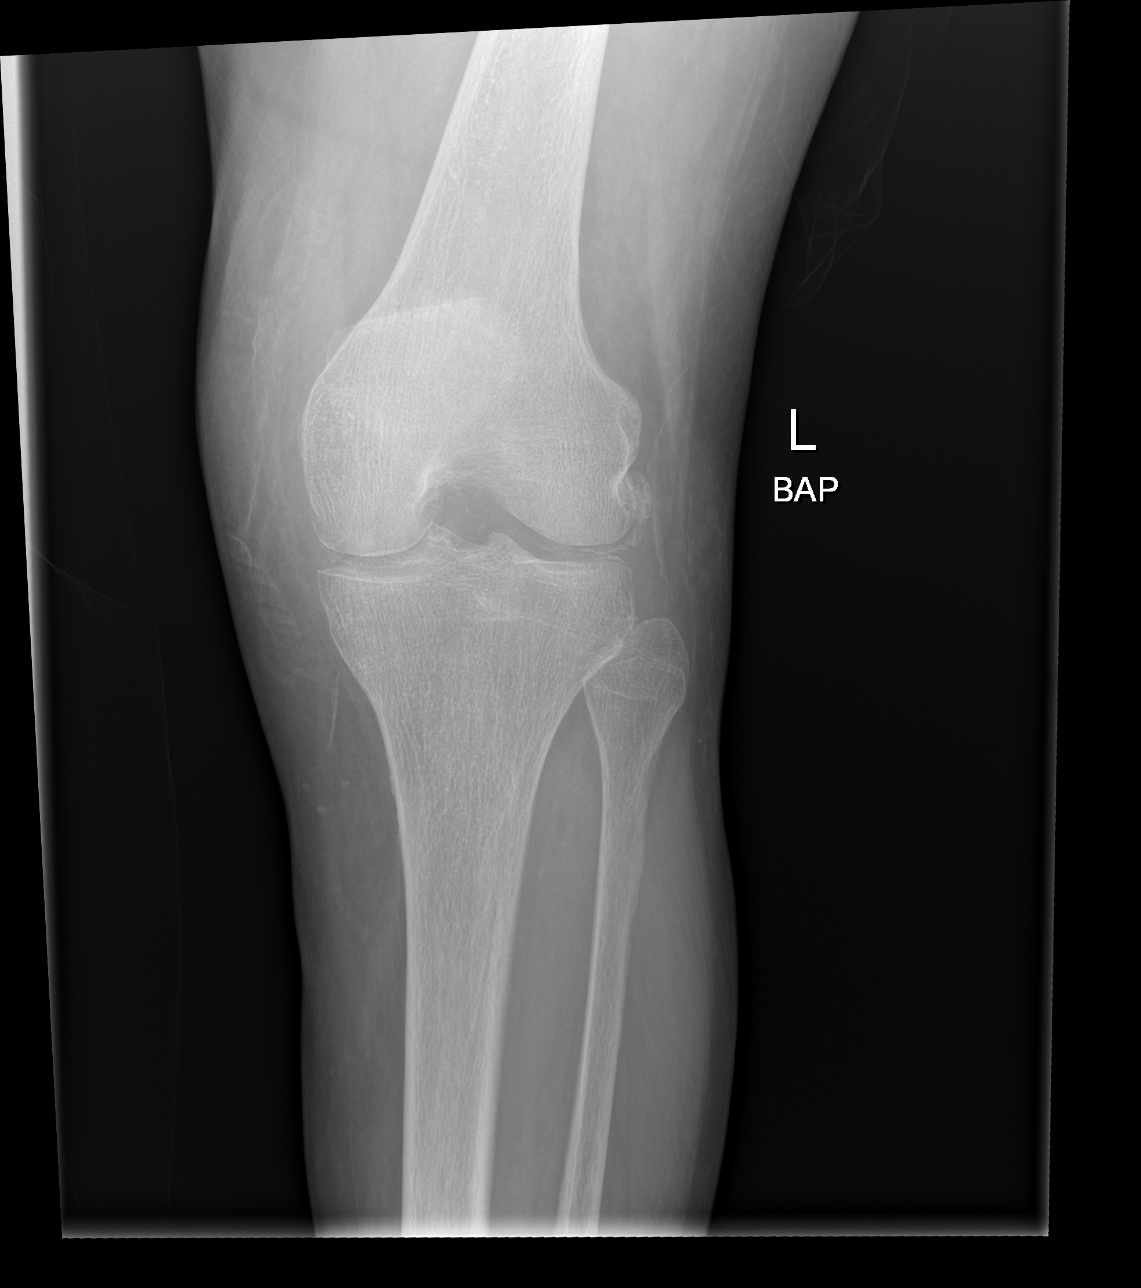

[x knee obl left (2 of 2)]
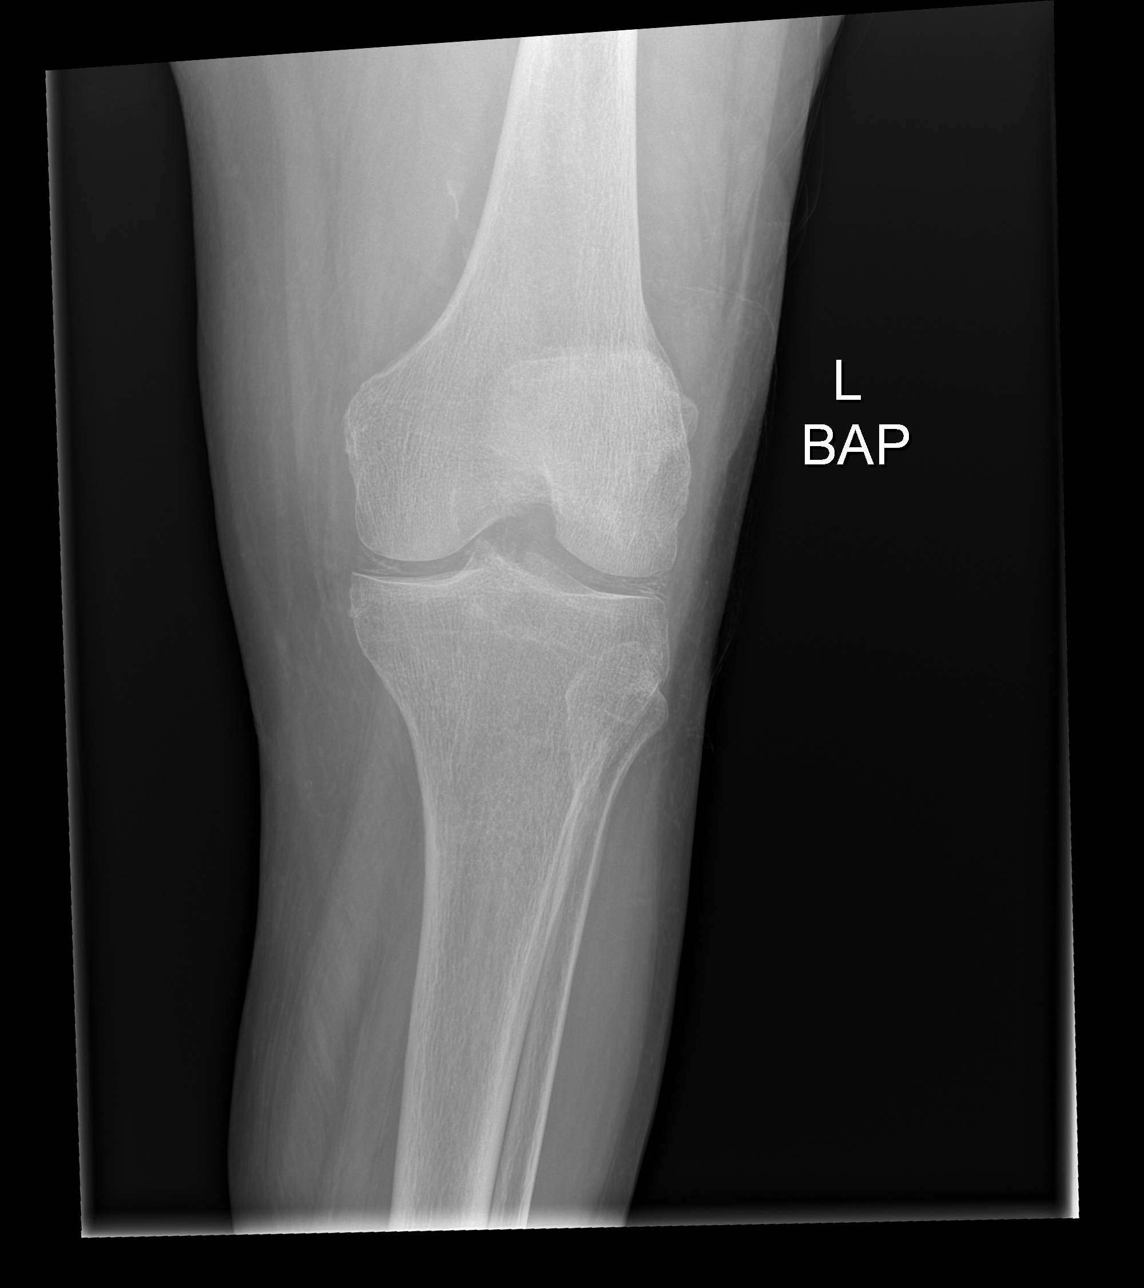

[x knee lat left]
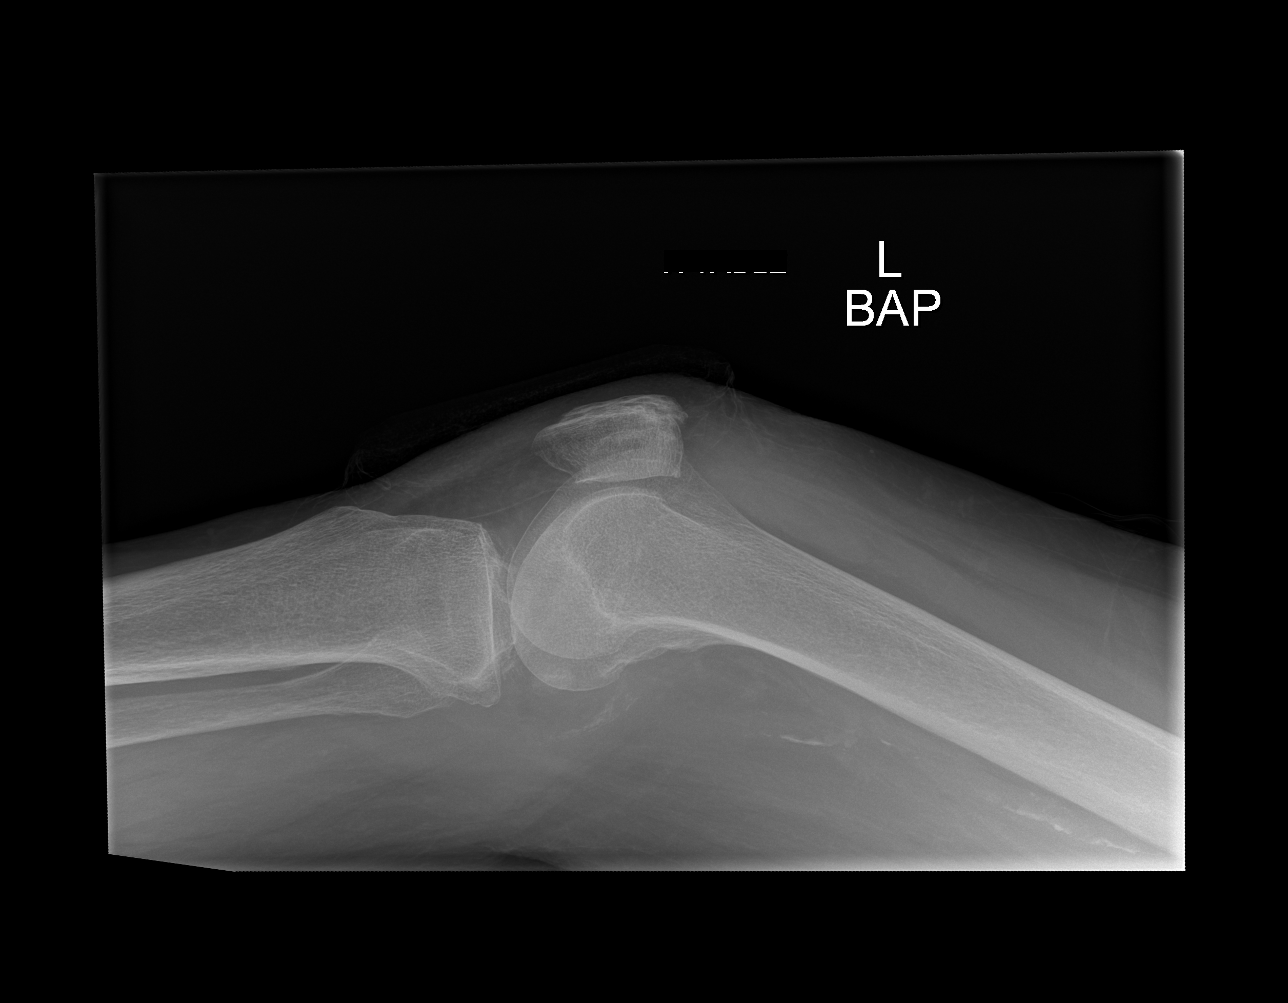

[4 of 4 positions shown; findings below may reference images not displayed]

FINDINGS: Mild lateral joint space narrowing. Medial and lateral meniscal
calcifications. Minimal spur formation involving all 3 joint
compartments. Small to moderate-sized effusion. No fracture or
dislocation seen.
IMPRESSION: 1. Small to moderate-sized effusion.
2. No fracture.
3. Minimal tricompartmental degenerative changes.
4. Chondrocalcinosis.

## 2019-08-20 DIAGNOSIS — Z8616 Personal history of COVID-19: Secondary | ICD-10-CM | POA: Diagnosis not present

## 2019-08-20 DIAGNOSIS — I10 Essential (primary) hypertension: Secondary | ICD-10-CM | POA: Diagnosis not present

## 2019-08-20 DIAGNOSIS — K5901 Slow transit constipation: Secondary | ICD-10-CM | POA: Diagnosis not present

## 2019-08-20 DIAGNOSIS — M25552 Pain in left hip: Secondary | ICD-10-CM | POA: Diagnosis not present

## 2019-08-20 DIAGNOSIS — Z79899 Other long term (current) drug therapy: Secondary | ICD-10-CM | POA: Diagnosis not present

## 2019-09-01 DIAGNOSIS — I739 Peripheral vascular disease, unspecified: Secondary | ICD-10-CM | POA: Diagnosis not present

## 2019-09-01 DIAGNOSIS — L602 Onychogryphosis: Secondary | ICD-10-CM | POA: Diagnosis not present

## 2019-09-10 DIAGNOSIS — K5901 Slow transit constipation: Secondary | ICD-10-CM | POA: Diagnosis not present

## 2019-09-10 DIAGNOSIS — I10 Essential (primary) hypertension: Secondary | ICD-10-CM | POA: Diagnosis not present

## 2019-09-10 DIAGNOSIS — Z79899 Other long term (current) drug therapy: Secondary | ICD-10-CM | POA: Diagnosis not present

## 2019-09-10 DIAGNOSIS — Z8616 Personal history of COVID-19: Secondary | ICD-10-CM | POA: Diagnosis not present

## 2019-09-11 DIAGNOSIS — Z79899 Other long term (current) drug therapy: Secondary | ICD-10-CM | POA: Diagnosis not present

## 2019-09-11 DIAGNOSIS — N390 Urinary tract infection, site not specified: Secondary | ICD-10-CM | POA: Diagnosis not present

## 2019-09-17 DIAGNOSIS — Z79899 Other long term (current) drug therapy: Secondary | ICD-10-CM | POA: Diagnosis not present

## 2019-09-17 DIAGNOSIS — I10 Essential (primary) hypertension: Secondary | ICD-10-CM | POA: Diagnosis not present

## 2019-09-17 DIAGNOSIS — K5901 Slow transit constipation: Secondary | ICD-10-CM | POA: Diagnosis not present

## 2019-09-17 DIAGNOSIS — Z8616 Personal history of COVID-19: Secondary | ICD-10-CM | POA: Diagnosis not present

## 2019-09-17 DIAGNOSIS — N390 Urinary tract infection, site not specified: Secondary | ICD-10-CM | POA: Diagnosis not present

## 2019-10-08 DIAGNOSIS — K5901 Slow transit constipation: Secondary | ICD-10-CM | POA: Diagnosis not present

## 2019-10-08 DIAGNOSIS — I10 Essential (primary) hypertension: Secondary | ICD-10-CM | POA: Diagnosis not present

## 2019-10-08 DIAGNOSIS — Z8616 Personal history of COVID-19: Secondary | ICD-10-CM | POA: Diagnosis not present

## 2019-10-08 DIAGNOSIS — Z79899 Other long term (current) drug therapy: Secondary | ICD-10-CM | POA: Diagnosis not present

## 2019-10-09 DIAGNOSIS — M170 Bilateral primary osteoarthritis of knee: Secondary | ICD-10-CM | POA: Diagnosis not present

## 2019-10-09 DIAGNOSIS — Z9181 History of falling: Secondary | ICD-10-CM | POA: Diagnosis not present

## 2019-10-09 DIAGNOSIS — Z87440 Personal history of urinary (tract) infections: Secondary | ICD-10-CM | POA: Diagnosis not present

## 2019-10-09 DIAGNOSIS — Z7982 Long term (current) use of aspirin: Secondary | ICD-10-CM | POA: Diagnosis not present

## 2019-10-09 DIAGNOSIS — Z8673 Personal history of transient ischemic attack (TIA), and cerebral infarction without residual deficits: Secondary | ICD-10-CM | POA: Diagnosis not present

## 2019-10-09 DIAGNOSIS — Z7902 Long term (current) use of antithrombotics/antiplatelets: Secondary | ICD-10-CM | POA: Diagnosis not present

## 2019-10-09 DIAGNOSIS — I10 Essential (primary) hypertension: Secondary | ICD-10-CM | POA: Diagnosis not present

## 2019-10-09 DIAGNOSIS — K5901 Slow transit constipation: Secondary | ICD-10-CM | POA: Diagnosis not present

## 2019-10-09 DIAGNOSIS — Z8616 Personal history of COVID-19: Secondary | ICD-10-CM | POA: Diagnosis not present

## 2019-10-11 DIAGNOSIS — K5901 Slow transit constipation: Secondary | ICD-10-CM | POA: Diagnosis not present

## 2019-10-11 DIAGNOSIS — Z8673 Personal history of transient ischemic attack (TIA), and cerebral infarction without residual deficits: Secondary | ICD-10-CM | POA: Diagnosis not present

## 2019-10-11 DIAGNOSIS — Z7982 Long term (current) use of aspirin: Secondary | ICD-10-CM | POA: Diagnosis not present

## 2019-10-11 DIAGNOSIS — Z87440 Personal history of urinary (tract) infections: Secondary | ICD-10-CM | POA: Diagnosis not present

## 2019-10-11 DIAGNOSIS — I10 Essential (primary) hypertension: Secondary | ICD-10-CM | POA: Diagnosis not present

## 2019-10-11 DIAGNOSIS — M170 Bilateral primary osteoarthritis of knee: Secondary | ICD-10-CM | POA: Diagnosis not present

## 2019-10-11 DIAGNOSIS — Z9181 History of falling: Secondary | ICD-10-CM | POA: Diagnosis not present

## 2019-10-11 DIAGNOSIS — Z7902 Long term (current) use of antithrombotics/antiplatelets: Secondary | ICD-10-CM | POA: Diagnosis not present

## 2019-10-11 DIAGNOSIS — Z8616 Personal history of COVID-19: Secondary | ICD-10-CM | POA: Diagnosis not present

## 2019-10-16 DIAGNOSIS — Z9181 History of falling: Secondary | ICD-10-CM | POA: Diagnosis not present

## 2019-10-16 DIAGNOSIS — M170 Bilateral primary osteoarthritis of knee: Secondary | ICD-10-CM | POA: Diagnosis not present

## 2019-10-16 DIAGNOSIS — Z7902 Long term (current) use of antithrombotics/antiplatelets: Secondary | ICD-10-CM | POA: Diagnosis not present

## 2019-10-16 DIAGNOSIS — I10 Essential (primary) hypertension: Secondary | ICD-10-CM | POA: Diagnosis not present

## 2019-10-16 DIAGNOSIS — Z8616 Personal history of COVID-19: Secondary | ICD-10-CM | POA: Diagnosis not present

## 2019-10-16 DIAGNOSIS — Z8673 Personal history of transient ischemic attack (TIA), and cerebral infarction without residual deficits: Secondary | ICD-10-CM | POA: Diagnosis not present

## 2019-10-16 DIAGNOSIS — Z87440 Personal history of urinary (tract) infections: Secondary | ICD-10-CM | POA: Diagnosis not present

## 2019-10-16 DIAGNOSIS — K5901 Slow transit constipation: Secondary | ICD-10-CM | POA: Diagnosis not present

## 2019-10-16 DIAGNOSIS — Z7982 Long term (current) use of aspirin: Secondary | ICD-10-CM | POA: Diagnosis not present

## 2019-10-18 DIAGNOSIS — K5901 Slow transit constipation: Secondary | ICD-10-CM | POA: Diagnosis not present

## 2019-10-18 DIAGNOSIS — Z87440 Personal history of urinary (tract) infections: Secondary | ICD-10-CM | POA: Diagnosis not present

## 2019-10-18 DIAGNOSIS — Z9181 History of falling: Secondary | ICD-10-CM | POA: Diagnosis not present

## 2019-10-18 DIAGNOSIS — M170 Bilateral primary osteoarthritis of knee: Secondary | ICD-10-CM | POA: Diagnosis not present

## 2019-10-18 DIAGNOSIS — Z8616 Personal history of COVID-19: Secondary | ICD-10-CM | POA: Diagnosis not present

## 2019-10-18 DIAGNOSIS — Z7982 Long term (current) use of aspirin: Secondary | ICD-10-CM | POA: Diagnosis not present

## 2019-10-18 DIAGNOSIS — I10 Essential (primary) hypertension: Secondary | ICD-10-CM | POA: Diagnosis not present

## 2019-10-18 DIAGNOSIS — Z8673 Personal history of transient ischemic attack (TIA), and cerebral infarction without residual deficits: Secondary | ICD-10-CM | POA: Diagnosis not present

## 2019-10-18 DIAGNOSIS — Z7902 Long term (current) use of antithrombotics/antiplatelets: Secondary | ICD-10-CM | POA: Diagnosis not present

## 2019-10-22 DIAGNOSIS — Z9181 History of falling: Secondary | ICD-10-CM | POA: Diagnosis not present

## 2019-10-22 DIAGNOSIS — K5901 Slow transit constipation: Secondary | ICD-10-CM | POA: Diagnosis not present

## 2019-10-22 DIAGNOSIS — Z7982 Long term (current) use of aspirin: Secondary | ICD-10-CM | POA: Diagnosis not present

## 2019-10-22 DIAGNOSIS — M170 Bilateral primary osteoarthritis of knee: Secondary | ICD-10-CM | POA: Diagnosis not present

## 2019-10-22 DIAGNOSIS — Z8616 Personal history of COVID-19: Secondary | ICD-10-CM | POA: Diagnosis not present

## 2019-10-22 DIAGNOSIS — Z87440 Personal history of urinary (tract) infections: Secondary | ICD-10-CM | POA: Diagnosis not present

## 2019-10-22 DIAGNOSIS — Z7902 Long term (current) use of antithrombotics/antiplatelets: Secondary | ICD-10-CM | POA: Diagnosis not present

## 2019-10-22 DIAGNOSIS — I10 Essential (primary) hypertension: Secondary | ICD-10-CM | POA: Diagnosis not present

## 2019-10-22 DIAGNOSIS — Z8673 Personal history of transient ischemic attack (TIA), and cerebral infarction without residual deficits: Secondary | ICD-10-CM | POA: Diagnosis not present

## 2019-10-25 DIAGNOSIS — Z9181 History of falling: Secondary | ICD-10-CM | POA: Diagnosis not present

## 2019-10-25 DIAGNOSIS — Z87440 Personal history of urinary (tract) infections: Secondary | ICD-10-CM | POA: Diagnosis not present

## 2019-10-25 DIAGNOSIS — Z8616 Personal history of COVID-19: Secondary | ICD-10-CM | POA: Diagnosis not present

## 2019-10-25 DIAGNOSIS — K5901 Slow transit constipation: Secondary | ICD-10-CM | POA: Diagnosis not present

## 2019-10-25 DIAGNOSIS — Z7982 Long term (current) use of aspirin: Secondary | ICD-10-CM | POA: Diagnosis not present

## 2019-10-25 DIAGNOSIS — I10 Essential (primary) hypertension: Secondary | ICD-10-CM | POA: Diagnosis not present

## 2019-10-25 DIAGNOSIS — Z8673 Personal history of transient ischemic attack (TIA), and cerebral infarction without residual deficits: Secondary | ICD-10-CM | POA: Diagnosis not present

## 2019-10-25 DIAGNOSIS — Z7902 Long term (current) use of antithrombotics/antiplatelets: Secondary | ICD-10-CM | POA: Diagnosis not present

## 2019-10-25 DIAGNOSIS — M170 Bilateral primary osteoarthritis of knee: Secondary | ICD-10-CM | POA: Diagnosis not present

## 2019-10-30 DIAGNOSIS — I10 Essential (primary) hypertension: Secondary | ICD-10-CM | POA: Diagnosis not present

## 2019-10-30 DIAGNOSIS — Z9181 History of falling: Secondary | ICD-10-CM | POA: Diagnosis not present

## 2019-10-30 DIAGNOSIS — K5901 Slow transit constipation: Secondary | ICD-10-CM | POA: Diagnosis not present

## 2019-10-30 DIAGNOSIS — M170 Bilateral primary osteoarthritis of knee: Secondary | ICD-10-CM | POA: Diagnosis not present

## 2019-10-30 DIAGNOSIS — Z8616 Personal history of COVID-19: Secondary | ICD-10-CM | POA: Diagnosis not present

## 2019-10-30 DIAGNOSIS — Z7902 Long term (current) use of antithrombotics/antiplatelets: Secondary | ICD-10-CM | POA: Diagnosis not present

## 2019-10-30 DIAGNOSIS — Z7982 Long term (current) use of aspirin: Secondary | ICD-10-CM | POA: Diagnosis not present

## 2019-10-30 DIAGNOSIS — Z8673 Personal history of transient ischemic attack (TIA), and cerebral infarction without residual deficits: Secondary | ICD-10-CM | POA: Diagnosis not present

## 2019-10-30 DIAGNOSIS — Z87440 Personal history of urinary (tract) infections: Secondary | ICD-10-CM | POA: Diagnosis not present

## 2019-10-31 DIAGNOSIS — R208 Other disturbances of skin sensation: Secondary | ICD-10-CM | POA: Diagnosis not present

## 2019-10-31 DIAGNOSIS — I739 Peripheral vascular disease, unspecified: Secondary | ICD-10-CM | POA: Diagnosis not present

## 2019-10-31 DIAGNOSIS — R600 Localized edema: Secondary | ICD-10-CM | POA: Diagnosis not present

## 2019-10-31 DIAGNOSIS — I8393 Asymptomatic varicose veins of bilateral lower extremities: Secondary | ICD-10-CM | POA: Diagnosis not present

## 2019-10-31 DIAGNOSIS — B351 Tinea unguium: Secondary | ICD-10-CM | POA: Diagnosis not present

## 2019-11-01 DIAGNOSIS — Z9181 History of falling: Secondary | ICD-10-CM | POA: Diagnosis not present

## 2019-11-01 DIAGNOSIS — I10 Essential (primary) hypertension: Secondary | ICD-10-CM | POA: Diagnosis not present

## 2019-11-01 DIAGNOSIS — Z87440 Personal history of urinary (tract) infections: Secondary | ICD-10-CM | POA: Diagnosis not present

## 2019-11-01 DIAGNOSIS — Z7902 Long term (current) use of antithrombotics/antiplatelets: Secondary | ICD-10-CM | POA: Diagnosis not present

## 2019-11-01 DIAGNOSIS — K5901 Slow transit constipation: Secondary | ICD-10-CM | POA: Diagnosis not present

## 2019-11-01 DIAGNOSIS — Z8616 Personal history of COVID-19: Secondary | ICD-10-CM | POA: Diagnosis not present

## 2019-11-01 DIAGNOSIS — Z8673 Personal history of transient ischemic attack (TIA), and cerebral infarction without residual deficits: Secondary | ICD-10-CM | POA: Diagnosis not present

## 2019-11-01 DIAGNOSIS — Z7982 Long term (current) use of aspirin: Secondary | ICD-10-CM | POA: Diagnosis not present

## 2019-11-01 DIAGNOSIS — M170 Bilateral primary osteoarthritis of knee: Secondary | ICD-10-CM | POA: Diagnosis not present

## 2019-11-05 DIAGNOSIS — I10 Essential (primary) hypertension: Secondary | ICD-10-CM | POA: Diagnosis not present

## 2019-11-05 DIAGNOSIS — E039 Hypothyroidism, unspecified: Secondary | ICD-10-CM | POA: Diagnosis not present

## 2019-11-05 DIAGNOSIS — K219 Gastro-esophageal reflux disease without esophagitis: Secondary | ICD-10-CM | POA: Diagnosis not present

## 2019-11-06 DIAGNOSIS — K5901 Slow transit constipation: Secondary | ICD-10-CM | POA: Diagnosis not present

## 2019-11-06 DIAGNOSIS — Z87440 Personal history of urinary (tract) infections: Secondary | ICD-10-CM | POA: Diagnosis not present

## 2019-11-06 DIAGNOSIS — Z9181 History of falling: Secondary | ICD-10-CM | POA: Diagnosis not present

## 2019-11-06 DIAGNOSIS — I10 Essential (primary) hypertension: Secondary | ICD-10-CM | POA: Diagnosis not present

## 2019-11-06 DIAGNOSIS — Z8673 Personal history of transient ischemic attack (TIA), and cerebral infarction without residual deficits: Secondary | ICD-10-CM | POA: Diagnosis not present

## 2019-11-06 DIAGNOSIS — M170 Bilateral primary osteoarthritis of knee: Secondary | ICD-10-CM | POA: Diagnosis not present

## 2019-11-06 DIAGNOSIS — Z7902 Long term (current) use of antithrombotics/antiplatelets: Secondary | ICD-10-CM | POA: Diagnosis not present

## 2019-11-06 DIAGNOSIS — Z8616 Personal history of COVID-19: Secondary | ICD-10-CM | POA: Diagnosis not present

## 2019-11-06 DIAGNOSIS — Z7982 Long term (current) use of aspirin: Secondary | ICD-10-CM | POA: Diagnosis not present

## 2019-11-13 DIAGNOSIS — Z87440 Personal history of urinary (tract) infections: Secondary | ICD-10-CM | POA: Diagnosis not present

## 2019-11-13 DIAGNOSIS — Z8616 Personal history of COVID-19: Secondary | ICD-10-CM | POA: Diagnosis not present

## 2019-11-13 DIAGNOSIS — Z8673 Personal history of transient ischemic attack (TIA), and cerebral infarction without residual deficits: Secondary | ICD-10-CM | POA: Diagnosis not present

## 2019-11-13 DIAGNOSIS — M170 Bilateral primary osteoarthritis of knee: Secondary | ICD-10-CM | POA: Diagnosis not present

## 2019-11-13 DIAGNOSIS — Z7902 Long term (current) use of antithrombotics/antiplatelets: Secondary | ICD-10-CM | POA: Diagnosis not present

## 2019-11-13 DIAGNOSIS — I10 Essential (primary) hypertension: Secondary | ICD-10-CM | POA: Diagnosis not present

## 2019-11-13 DIAGNOSIS — Z7982 Long term (current) use of aspirin: Secondary | ICD-10-CM | POA: Diagnosis not present

## 2019-11-13 DIAGNOSIS — K5901 Slow transit constipation: Secondary | ICD-10-CM | POA: Diagnosis not present

## 2019-11-13 DIAGNOSIS — Z9181 History of falling: Secondary | ICD-10-CM | POA: Diagnosis not present

## 2019-11-15 DIAGNOSIS — I10 Essential (primary) hypertension: Secondary | ICD-10-CM | POA: Diagnosis not present

## 2019-11-15 DIAGNOSIS — Z9181 History of falling: Secondary | ICD-10-CM | POA: Diagnosis not present

## 2019-11-15 DIAGNOSIS — Z8673 Personal history of transient ischemic attack (TIA), and cerebral infarction without residual deficits: Secondary | ICD-10-CM | POA: Diagnosis not present

## 2019-11-15 DIAGNOSIS — Z8616 Personal history of COVID-19: Secondary | ICD-10-CM | POA: Diagnosis not present

## 2019-11-15 DIAGNOSIS — M170 Bilateral primary osteoarthritis of knee: Secondary | ICD-10-CM | POA: Diagnosis not present

## 2019-11-15 DIAGNOSIS — Z87440 Personal history of urinary (tract) infections: Secondary | ICD-10-CM | POA: Diagnosis not present

## 2019-11-15 DIAGNOSIS — K5901 Slow transit constipation: Secondary | ICD-10-CM | POA: Diagnosis not present

## 2019-11-15 DIAGNOSIS — Z7902 Long term (current) use of antithrombotics/antiplatelets: Secondary | ICD-10-CM | POA: Diagnosis not present

## 2019-11-15 DIAGNOSIS — Z7982 Long term (current) use of aspirin: Secondary | ICD-10-CM | POA: Diagnosis not present

## 2019-11-19 DIAGNOSIS — I10 Essential (primary) hypertension: Secondary | ICD-10-CM | POA: Diagnosis not present

## 2019-11-19 DIAGNOSIS — E039 Hypothyroidism, unspecified: Secondary | ICD-10-CM | POA: Diagnosis not present

## 2019-11-19 DIAGNOSIS — K219 Gastro-esophageal reflux disease without esophagitis: Secondary | ICD-10-CM | POA: Diagnosis not present

## 2019-11-19 DIAGNOSIS — M1909 Primary osteoarthritis, other specified site: Secondary | ICD-10-CM | POA: Diagnosis not present

## 2019-11-20 DIAGNOSIS — I10 Essential (primary) hypertension: Secondary | ICD-10-CM | POA: Diagnosis not present

## 2019-11-20 DIAGNOSIS — Z9181 History of falling: Secondary | ICD-10-CM | POA: Diagnosis not present

## 2019-11-20 DIAGNOSIS — Z8673 Personal history of transient ischemic attack (TIA), and cerebral infarction without residual deficits: Secondary | ICD-10-CM | POA: Diagnosis not present

## 2019-11-20 DIAGNOSIS — Z7902 Long term (current) use of antithrombotics/antiplatelets: Secondary | ICD-10-CM | POA: Diagnosis not present

## 2019-11-20 DIAGNOSIS — M170 Bilateral primary osteoarthritis of knee: Secondary | ICD-10-CM | POA: Diagnosis not present

## 2019-11-20 DIAGNOSIS — Z7982 Long term (current) use of aspirin: Secondary | ICD-10-CM | POA: Diagnosis not present

## 2019-11-20 DIAGNOSIS — Z87440 Personal history of urinary (tract) infections: Secondary | ICD-10-CM | POA: Diagnosis not present

## 2019-11-20 DIAGNOSIS — Z8616 Personal history of COVID-19: Secondary | ICD-10-CM | POA: Diagnosis not present

## 2019-11-20 DIAGNOSIS — K5901 Slow transit constipation: Secondary | ICD-10-CM | POA: Diagnosis not present

## 2019-11-22 DIAGNOSIS — I10 Essential (primary) hypertension: Secondary | ICD-10-CM | POA: Diagnosis not present

## 2019-11-22 DIAGNOSIS — M170 Bilateral primary osteoarthritis of knee: Secondary | ICD-10-CM | POA: Diagnosis not present

## 2019-11-23 DIAGNOSIS — I10 Essential (primary) hypertension: Secondary | ICD-10-CM | POA: Diagnosis not present

## 2019-11-23 DIAGNOSIS — Z9181 History of falling: Secondary | ICD-10-CM | POA: Diagnosis not present

## 2019-11-23 DIAGNOSIS — Z7902 Long term (current) use of antithrombotics/antiplatelets: Secondary | ICD-10-CM | POA: Diagnosis not present

## 2019-11-23 DIAGNOSIS — K5901 Slow transit constipation: Secondary | ICD-10-CM | POA: Diagnosis not present

## 2019-11-23 DIAGNOSIS — Z8616 Personal history of COVID-19: Secondary | ICD-10-CM | POA: Diagnosis not present

## 2019-11-23 DIAGNOSIS — Z87440 Personal history of urinary (tract) infections: Secondary | ICD-10-CM | POA: Diagnosis not present

## 2019-11-23 DIAGNOSIS — Z7982 Long term (current) use of aspirin: Secondary | ICD-10-CM | POA: Diagnosis not present

## 2019-11-23 DIAGNOSIS — M170 Bilateral primary osteoarthritis of knee: Secondary | ICD-10-CM | POA: Diagnosis not present

## 2019-11-23 DIAGNOSIS — Z8673 Personal history of transient ischemic attack (TIA), and cerebral infarction without residual deficits: Secondary | ICD-10-CM | POA: Diagnosis not present

## 2019-11-27 DIAGNOSIS — M170 Bilateral primary osteoarthritis of knee: Secondary | ICD-10-CM | POA: Diagnosis not present

## 2019-11-27 DIAGNOSIS — K5901 Slow transit constipation: Secondary | ICD-10-CM | POA: Diagnosis not present

## 2019-11-27 DIAGNOSIS — Z87440 Personal history of urinary (tract) infections: Secondary | ICD-10-CM | POA: Diagnosis not present

## 2019-11-27 DIAGNOSIS — Z8673 Personal history of transient ischemic attack (TIA), and cerebral infarction without residual deficits: Secondary | ICD-10-CM | POA: Diagnosis not present

## 2019-11-27 DIAGNOSIS — Z7902 Long term (current) use of antithrombotics/antiplatelets: Secondary | ICD-10-CM | POA: Diagnosis not present

## 2019-11-27 DIAGNOSIS — Z9181 History of falling: Secondary | ICD-10-CM | POA: Diagnosis not present

## 2019-11-27 DIAGNOSIS — I10 Essential (primary) hypertension: Secondary | ICD-10-CM | POA: Diagnosis not present

## 2019-11-27 DIAGNOSIS — Z7982 Long term (current) use of aspirin: Secondary | ICD-10-CM | POA: Diagnosis not present

## 2019-11-27 DIAGNOSIS — Z8616 Personal history of COVID-19: Secondary | ICD-10-CM | POA: Diagnosis not present

## 2019-11-29 DIAGNOSIS — Z9181 History of falling: Secondary | ICD-10-CM | POA: Diagnosis not present

## 2019-11-29 DIAGNOSIS — Z7982 Long term (current) use of aspirin: Secondary | ICD-10-CM | POA: Diagnosis not present

## 2019-11-29 DIAGNOSIS — I10 Essential (primary) hypertension: Secondary | ICD-10-CM | POA: Diagnosis not present

## 2019-11-29 DIAGNOSIS — K5901 Slow transit constipation: Secondary | ICD-10-CM | POA: Diagnosis not present

## 2019-11-29 DIAGNOSIS — Z7902 Long term (current) use of antithrombotics/antiplatelets: Secondary | ICD-10-CM | POA: Diagnosis not present

## 2019-11-29 DIAGNOSIS — Z87440 Personal history of urinary (tract) infections: Secondary | ICD-10-CM | POA: Diagnosis not present

## 2019-11-29 DIAGNOSIS — Z8673 Personal history of transient ischemic attack (TIA), and cerebral infarction without residual deficits: Secondary | ICD-10-CM | POA: Diagnosis not present

## 2019-11-29 DIAGNOSIS — M170 Bilateral primary osteoarthritis of knee: Secondary | ICD-10-CM | POA: Diagnosis not present

## 2019-11-29 DIAGNOSIS — Z8616 Personal history of COVID-19: Secondary | ICD-10-CM | POA: Diagnosis not present

## 2019-12-01 DIAGNOSIS — L602 Onychogryphosis: Secondary | ICD-10-CM | POA: Diagnosis not present

## 2019-12-01 DIAGNOSIS — I739 Peripheral vascular disease, unspecified: Secondary | ICD-10-CM | POA: Diagnosis not present

## 2019-12-01 DIAGNOSIS — L84 Corns and callosities: Secondary | ICD-10-CM | POA: Diagnosis not present

## 2019-12-03 DIAGNOSIS — Z7902 Long term (current) use of antithrombotics/antiplatelets: Secondary | ICD-10-CM | POA: Diagnosis not present

## 2019-12-03 DIAGNOSIS — K5901 Slow transit constipation: Secondary | ICD-10-CM | POA: Diagnosis not present

## 2019-12-03 DIAGNOSIS — Z8673 Personal history of transient ischemic attack (TIA), and cerebral infarction without residual deficits: Secondary | ICD-10-CM | POA: Diagnosis not present

## 2019-12-03 DIAGNOSIS — Z9181 History of falling: Secondary | ICD-10-CM | POA: Diagnosis not present

## 2019-12-03 DIAGNOSIS — Z7982 Long term (current) use of aspirin: Secondary | ICD-10-CM | POA: Diagnosis not present

## 2019-12-03 DIAGNOSIS — Z8616 Personal history of COVID-19: Secondary | ICD-10-CM | POA: Diagnosis not present

## 2019-12-03 DIAGNOSIS — M170 Bilateral primary osteoarthritis of knee: Secondary | ICD-10-CM | POA: Diagnosis not present

## 2019-12-03 DIAGNOSIS — I10 Essential (primary) hypertension: Secondary | ICD-10-CM | POA: Diagnosis not present

## 2019-12-03 DIAGNOSIS — Z87440 Personal history of urinary (tract) infections: Secondary | ICD-10-CM | POA: Diagnosis not present

## 2019-12-05 DIAGNOSIS — Z87440 Personal history of urinary (tract) infections: Secondary | ICD-10-CM | POA: Diagnosis not present

## 2019-12-05 DIAGNOSIS — K5901 Slow transit constipation: Secondary | ICD-10-CM | POA: Diagnosis not present

## 2019-12-05 DIAGNOSIS — Z8616 Personal history of COVID-19: Secondary | ICD-10-CM | POA: Diagnosis not present

## 2019-12-05 DIAGNOSIS — I10 Essential (primary) hypertension: Secondary | ICD-10-CM | POA: Diagnosis not present

## 2019-12-05 DIAGNOSIS — Z9181 History of falling: Secondary | ICD-10-CM | POA: Diagnosis not present

## 2019-12-05 DIAGNOSIS — Z7902 Long term (current) use of antithrombotics/antiplatelets: Secondary | ICD-10-CM | POA: Diagnosis not present

## 2019-12-05 DIAGNOSIS — Z8673 Personal history of transient ischemic attack (TIA), and cerebral infarction without residual deficits: Secondary | ICD-10-CM | POA: Diagnosis not present

## 2019-12-05 DIAGNOSIS — Z7982 Long term (current) use of aspirin: Secondary | ICD-10-CM | POA: Diagnosis not present

## 2019-12-05 DIAGNOSIS — M170 Bilateral primary osteoarthritis of knee: Secondary | ICD-10-CM | POA: Diagnosis not present

## 2019-12-10 DIAGNOSIS — N3000 Acute cystitis without hematuria: Secondary | ICD-10-CM | POA: Diagnosis not present

## 2019-12-10 DIAGNOSIS — K219 Gastro-esophageal reflux disease without esophagitis: Secondary | ICD-10-CM | POA: Diagnosis not present

## 2019-12-10 DIAGNOSIS — I10 Essential (primary) hypertension: Secondary | ICD-10-CM | POA: Diagnosis not present

## 2019-12-10 DIAGNOSIS — M1909 Primary osteoarthritis, other specified site: Secondary | ICD-10-CM | POA: Diagnosis not present

## 2020-01-02 DIAGNOSIS — M2011 Hallux valgus (acquired), right foot: Secondary | ICD-10-CM | POA: Diagnosis not present

## 2020-01-02 DIAGNOSIS — R600 Localized edema: Secondary | ICD-10-CM | POA: Diagnosis not present

## 2020-01-02 DIAGNOSIS — I739 Peripheral vascular disease, unspecified: Secondary | ICD-10-CM | POA: Diagnosis not present

## 2020-01-02 DIAGNOSIS — R208 Other disturbances of skin sensation: Secondary | ICD-10-CM | POA: Diagnosis not present

## 2020-01-02 DIAGNOSIS — B351 Tinea unguium: Secondary | ICD-10-CM | POA: Diagnosis not present

## 2020-01-07 DIAGNOSIS — I10 Essential (primary) hypertension: Secondary | ICD-10-CM | POA: Diagnosis not present

## 2020-01-07 DIAGNOSIS — K5901 Slow transit constipation: Secondary | ICD-10-CM | POA: Diagnosis not present

## 2020-01-07 DIAGNOSIS — K219 Gastro-esophageal reflux disease without esophagitis: Secondary | ICD-10-CM | POA: Diagnosis not present

## 2020-01-07 DIAGNOSIS — M1909 Primary osteoarthritis, other specified site: Secondary | ICD-10-CM | POA: Diagnosis not present

## 2020-02-04 DIAGNOSIS — K219 Gastro-esophageal reflux disease without esophagitis: Secondary | ICD-10-CM | POA: Diagnosis not present

## 2020-02-04 DIAGNOSIS — I10 Essential (primary) hypertension: Secondary | ICD-10-CM | POA: Diagnosis not present

## 2020-02-04 DIAGNOSIS — K5901 Slow transit constipation: Secondary | ICD-10-CM | POA: Diagnosis not present

## 2020-02-04 DIAGNOSIS — M1909 Primary osteoarthritis, other specified site: Secondary | ICD-10-CM | POA: Diagnosis not present

## 2020-03-10 DIAGNOSIS — I10 Essential (primary) hypertension: Secondary | ICD-10-CM | POA: Diagnosis not present

## 2020-03-10 DIAGNOSIS — K219 Gastro-esophageal reflux disease without esophagitis: Secondary | ICD-10-CM | POA: Diagnosis not present

## 2020-03-10 DIAGNOSIS — K5901 Slow transit constipation: Secondary | ICD-10-CM | POA: Diagnosis not present

## 2020-03-27 DIAGNOSIS — R208 Other disturbances of skin sensation: Secondary | ICD-10-CM | POA: Diagnosis not present

## 2020-03-27 DIAGNOSIS — R600 Localized edema: Secondary | ICD-10-CM | POA: Diagnosis not present

## 2020-03-27 DIAGNOSIS — I739 Peripheral vascular disease, unspecified: Secondary | ICD-10-CM | POA: Diagnosis not present

## 2020-03-27 DIAGNOSIS — M2011 Hallux valgus (acquired), right foot: Secondary | ICD-10-CM | POA: Diagnosis not present

## 2020-03-27 DIAGNOSIS — B351 Tinea unguium: Secondary | ICD-10-CM | POA: Diagnosis not present

## 2020-04-07 DIAGNOSIS — I10 Essential (primary) hypertension: Secondary | ICD-10-CM | POA: Diagnosis not present

## 2020-04-07 DIAGNOSIS — K219 Gastro-esophageal reflux disease without esophagitis: Secondary | ICD-10-CM | POA: Diagnosis not present

## 2020-04-07 DIAGNOSIS — K5901 Slow transit constipation: Secondary | ICD-10-CM | POA: Diagnosis not present

## 2020-04-11 DIAGNOSIS — I10 Essential (primary) hypertension: Secondary | ICD-10-CM | POA: Diagnosis not present

## 2020-04-11 DIAGNOSIS — K5901 Slow transit constipation: Secondary | ICD-10-CM | POA: Diagnosis not present

## 2020-04-11 DIAGNOSIS — K219 Gastro-esophageal reflux disease without esophagitis: Secondary | ICD-10-CM | POA: Diagnosis not present

## 2020-05-12 DIAGNOSIS — N3000 Acute cystitis without hematuria: Secondary | ICD-10-CM | POA: Diagnosis not present

## 2020-05-14 DIAGNOSIS — N390 Urinary tract infection, site not specified: Secondary | ICD-10-CM | POA: Diagnosis not present

## 2020-06-09 DIAGNOSIS — I10 Essential (primary) hypertension: Secondary | ICD-10-CM | POA: Diagnosis not present

## 2020-06-09 DIAGNOSIS — K219 Gastro-esophageal reflux disease without esophagitis: Secondary | ICD-10-CM | POA: Diagnosis not present

## 2020-06-09 DIAGNOSIS — K5901 Slow transit constipation: Secondary | ICD-10-CM | POA: Diagnosis not present

## 2020-06-09 DIAGNOSIS — Z87440 Personal history of urinary (tract) infections: Secondary | ICD-10-CM | POA: Diagnosis not present

## 2020-06-26 DIAGNOSIS — Z03818 Encounter for observation for suspected exposure to other biological agents ruled out: Secondary | ICD-10-CM | POA: Diagnosis not present

## 2020-07-16 DIAGNOSIS — Z03818 Encounter for observation for suspected exposure to other biological agents ruled out: Secondary | ICD-10-CM | POA: Diagnosis not present

## 2020-08-25 DIAGNOSIS — R197 Diarrhea, unspecified: Secondary | ICD-10-CM | POA: Diagnosis not present

## 2020-08-25 DIAGNOSIS — I10 Essential (primary) hypertension: Secondary | ICD-10-CM | POA: Diagnosis not present

## 2020-08-25 DIAGNOSIS — K219 Gastro-esophageal reflux disease without esophagitis: Secondary | ICD-10-CM | POA: Diagnosis not present

## 2020-09-02 DIAGNOSIS — N390 Urinary tract infection, site not specified: Secondary | ICD-10-CM | POA: Diagnosis not present

## 2020-11-17 DIAGNOSIS — Z03818 Encounter for observation for suspected exposure to other biological agents ruled out: Secondary | ICD-10-CM | POA: Diagnosis not present

## 2020-11-17 DIAGNOSIS — G603 Idiopathic progressive neuropathy: Secondary | ICD-10-CM | POA: Diagnosis not present

## 2020-11-25 DIAGNOSIS — Z03818 Encounter for observation for suspected exposure to other biological agents ruled out: Secondary | ICD-10-CM | POA: Diagnosis not present

## 2020-11-30 DIAGNOSIS — Z03818 Encounter for observation for suspected exposure to other biological agents ruled out: Secondary | ICD-10-CM | POA: Diagnosis not present

## 2020-12-01 DIAGNOSIS — I10 Essential (primary) hypertension: Secondary | ICD-10-CM | POA: Diagnosis not present

## 2020-12-01 DIAGNOSIS — K5901 Slow transit constipation: Secondary | ICD-10-CM | POA: Diagnosis not present

## 2020-12-01 DIAGNOSIS — N3000 Acute cystitis without hematuria: Secondary | ICD-10-CM | POA: Diagnosis not present

## 2020-12-01 DIAGNOSIS — H6123 Impacted cerumen, bilateral: Secondary | ICD-10-CM | POA: Diagnosis not present

## 2020-12-01 DIAGNOSIS — K219 Gastro-esophageal reflux disease without esophagitis: Secondary | ICD-10-CM | POA: Diagnosis not present

## 2020-12-22 DIAGNOSIS — G603 Idiopathic progressive neuropathy: Secondary | ICD-10-CM | POA: Diagnosis not present

## 2022-11-26 ENCOUNTER — Encounter (HOSPITAL_COMMUNITY): Payer: Self-pay

## 2022-11-26 ENCOUNTER — Emergency Department (HOSPITAL_COMMUNITY): Payer: Medicare Other

## 2022-11-26 ENCOUNTER — Other Ambulatory Visit: Payer: Self-pay

## 2022-11-26 ENCOUNTER — Inpatient Hospital Stay (HOSPITAL_COMMUNITY)
Admission: EM | Admit: 2022-11-26 | Discharge: 2022-11-30 | DRG: 086 | Disposition: A | Discharge status: Skilled Nursing Facility | Payer: Medicare Other | Source: Skilled Nursing Facility | Attending: Internal Medicine | Admitting: Internal Medicine

## 2022-11-26 DIAGNOSIS — W01198A Fall on same level from slipping, tripping and stumbling with subsequent striking against other object, initial encounter: Secondary | ICD-10-CM | POA: Diagnosis present

## 2022-11-26 DIAGNOSIS — Z6824 Body mass index (BMI) 24.0-24.9, adult: Secondary | ICD-10-CM

## 2022-11-26 DIAGNOSIS — J90 Pleural effusion, not elsewhere classified: Secondary | ICD-10-CM

## 2022-11-26 DIAGNOSIS — Z8249 Family history of ischemic heart disease and other diseases of the circulatory system: Secondary | ICD-10-CM

## 2022-11-26 DIAGNOSIS — I10 Essential (primary) hypertension: Secondary | ICD-10-CM | POA: Diagnosis present

## 2022-11-26 DIAGNOSIS — E785 Hyperlipidemia, unspecified: Secondary | ICD-10-CM | POA: Diagnosis present

## 2022-11-26 DIAGNOSIS — E039 Hypothyroidism, unspecified: Secondary | ICD-10-CM | POA: Diagnosis present

## 2022-11-26 DIAGNOSIS — R32 Unspecified urinary incontinence: Secondary | ICD-10-CM | POA: Diagnosis present

## 2022-11-26 DIAGNOSIS — K219 Gastro-esophageal reflux disease without esophagitis: Secondary | ICD-10-CM | POA: Diagnosis present

## 2022-11-26 DIAGNOSIS — Z90710 Acquired absence of both cervix and uterus: Secondary | ICD-10-CM

## 2022-11-26 DIAGNOSIS — I252 Old myocardial infarction: Secondary | ICD-10-CM

## 2022-11-26 DIAGNOSIS — Z823 Family history of stroke: Secondary | ICD-10-CM

## 2022-11-26 DIAGNOSIS — I609 Nontraumatic subarachnoid hemorrhage, unspecified: Secondary | ICD-10-CM

## 2022-11-26 DIAGNOSIS — Z66 Do not resuscitate: Secondary | ICD-10-CM | POA: Diagnosis present

## 2022-11-26 DIAGNOSIS — Z981 Arthrodesis status: Secondary | ICD-10-CM

## 2022-11-26 DIAGNOSIS — Y92099 Unspecified place in other non-institutional residence as the place of occurrence of the external cause: Secondary | ICD-10-CM

## 2022-11-26 DIAGNOSIS — N390 Urinary tract infection, site not specified: Secondary | ICD-10-CM | POA: Diagnosis present

## 2022-11-26 DIAGNOSIS — Z885 Allergy status to narcotic agent status: Secondary | ICD-10-CM

## 2022-11-26 DIAGNOSIS — N3000 Acute cystitis without hematuria: Secondary | ICD-10-CM | POA: Diagnosis present

## 2022-11-26 DIAGNOSIS — I2510 Atherosclerotic heart disease of native coronary artery without angina pectoris: Secondary | ICD-10-CM | POA: Diagnosis present

## 2022-11-26 DIAGNOSIS — Z888 Allergy status to other drugs, medicaments and biological substances status: Secondary | ICD-10-CM

## 2022-11-26 DIAGNOSIS — Z961 Presence of intraocular lens: Secondary | ICD-10-CM | POA: Diagnosis present

## 2022-11-26 DIAGNOSIS — S066X0A Traumatic subarachnoid hemorrhage without loss of consciousness, initial encounter: Principal | ICD-10-CM

## 2022-11-26 DIAGNOSIS — Z79899 Other long term (current) drug therapy: Secondary | ICD-10-CM

## 2022-11-26 DIAGNOSIS — Z7902 Long term (current) use of antithrombotics/antiplatelets: Secondary | ICD-10-CM

## 2022-11-26 DIAGNOSIS — I4891 Unspecified atrial fibrillation: Secondary | ICD-10-CM | POA: Diagnosis present

## 2022-11-26 DIAGNOSIS — Z7901 Long term (current) use of anticoagulants: Secondary | ICD-10-CM

## 2022-11-26 DIAGNOSIS — Z880 Allergy status to penicillin: Secondary | ICD-10-CM

## 2022-11-26 DIAGNOSIS — Z79890 Hormone replacement therapy (postmenopausal): Secondary | ICD-10-CM

## 2022-11-26 DIAGNOSIS — Z923 Personal history of irradiation: Secondary | ICD-10-CM

## 2022-11-26 DIAGNOSIS — Z8673 Personal history of transient ischemic attack (TIA), and cerebral infarction without residual deficits: Secondary | ICD-10-CM

## 2022-11-26 DIAGNOSIS — W19XXXA Unspecified fall, initial encounter: Secondary | ICD-10-CM

## 2022-11-26 DIAGNOSIS — R531 Weakness: Secondary | ICD-10-CM | POA: Diagnosis present

## 2022-11-26 DIAGNOSIS — Z7982 Long term (current) use of aspirin: Secondary | ICD-10-CM

## 2022-11-26 DIAGNOSIS — E669 Obesity, unspecified: Secondary | ICD-10-CM | POA: Diagnosis present

## 2022-11-26 LAB — SAMPLE TO BLOOD BANK

## 2022-11-26 LAB — I-STAT CHEM 8, ED
BUN: 11 mg/dL (ref 8–23)
Calcium, Ion: 1.09 mmol/L — ABNORMAL LOW (ref 1.15–1.40)
Chloride: 99 mmol/L (ref 98–111)
Creatinine, Ser: 0.7 mg/dL (ref 0.44–1.00)
Glucose, Bld: 117 mg/dL — ABNORMAL HIGH (ref 70–99)
HCT: 41 % (ref 36.0–46.0)
Hemoglobin: 13.9 g/dL (ref 12.0–15.0)
Potassium: 3.8 mmol/L (ref 3.5–5.1)
Sodium: 135 mmol/L (ref 135–145)
TCO2: 25 mmol/L (ref 22–32)

## 2022-11-26 NOTE — ED Notes (Signed)
Trauma Response Nurse Documentation   Kimberly Bass is a 89 y.o. female arriving to [facility] ED via [facility]  On clopidogrel 75 mg daily. Trauma was activated as a Level 2 by Kimberly Bass based on the following trauma criteria Elderly patients > 65 with head trauma on anti-coagulation (excluding ASA).  Patient cleared for CT by Dr. Particia Nearing. Pt transported to CT with trauma response nurse present to monitor. RN remained with the patient throughout their absence from the department for clinical observation.   GCS 15.  History   Past Medical History:  Diagnosis Date   Arthritis    "mild; in all my joints" (04/27/2017)   Coronary artery disease    Cystitis with hematuria 04/26/2017   Gait disorder    H/O radioactive iodine thyroid ablation    Hx of transient ischemic attack (TIA)    Hyperlipidemia    Hypertension    "not since MI in 2007" (04/27/2017)   Hyperthyroidism    H/O radioactive iodine thyroid ablation [Z92.3]   Hypothyroidism    Lower extremity myoclonus    Lumbosacral spondylosis    Metabolic encephalopathy    Migraine    "I've had one in my lifetime" (04/27/2017)   Mild obesity    Myocardial infarction (HCC) 12/2005   Myoclonus    Lower extremity involvement   Obesity    Pneumonia    "lots when I was a small child; not at all since" (04/27/2017)   Rhabdomyolysis    Seizures (HCC)    History is questionable   Stroke [facility])    Thyroid disease      Past Surgical History:  Procedure Laterality Date   ABDOMINAL HYSTERECTOMY  1972   for uterine fibroids/notes 11/10/2010   ANTERIOR CERVICAL DECOMP/DISCECTOMY FUSION  03/2004   Kimberly Bass 11/10/2010   APPENDECTOMY     BACK SURGERY     BREAST BIOPSY Right 1996   nodule resected/notes 11/10/2010   CARDIAC CATHETERIZATION  01/03/2006   CATARACT EXTRACTION W/ INTRAOCULAR LENS  IMPLANT, BILATERAL Bilateral    COLONOSCOPY  11/2003   Kimberly Bass 11/10/2010   ESOPHAGOGASTRODUODENOSCOPY  09/2006   Kimberly Bass  11/10/2010   LUMBAR DISC SURGERY  1990s X 1; 05/2003   "a nerve was pinching"; /notes 11/10/2010   OVARY SURGERY Left 1999   benign tumor resected /notes 11/10/2010   TONSILLECTOMY  ~ 1947       Initial Focused Assessment (If applicable, or please see trauma documentation): Airway-- intact, no visible obstruction Breathing-- spontaneous, unlabored Circulation-- no obvious bleeding noted  CT's Completed:   CT Head and CT C-Spine   Interventions:  See event summary  Plan for disposition:  {Trauma Dispo:26867}   Consults completed:  {Trauma Consults:26862} at ***.  Event Summary: Patient brought in by [facility] from nursing facility. Patient fell this evening in the restroom striking her head on the wall. Only complaint of left hip pain from prior fall. Manual BP obtained. 20 G PIV LAC established. Trauma labs obtained. Xray chest and pelvis completed. Patient to CT with TRN. CT head and c-spine completed.  MTP Summary (If applicable):  N/A  Bedside handoff with ED RN Johnna Acosta.    Kimberly Bass  Trauma Response RN  Please call TRN at [phone] for further assistance.

## 2022-11-26 NOTE — ED Triage Notes (Signed)
Pt fell at [facility] while in the shower. + headstrike. + blood thinners. Previous fall yesterday with L hip pain, declined to come to ED

## 2022-11-26 NOTE — ED Provider Notes (Signed)
[place] EMERGENCY DEPARTMENT AT [facility] Provider Note   CSN: [id number] Arrival date & time: 11/26/22  2243     History {Add pertinent medical, surgical, social history, OB history to HPI:1} Chief Complaint  Patient presents with   Kimberly Bass is a 89 y.o. female.  89 year old female with a history of atrial fibrillation (on Eliquis) presents to the emergency department via EMS from [facility].  Patient was in the bathroom when she lost her balance and fell backwards into the wall striking her head.  She was found by staff immediately following her fall.  She had no loss of consciousness and denies any headache, neck pain.  She did have a fall yesterday onto her left hip which has caused some residual soreness and aggravation of her balance issues.  She has continued to ambulate at baseline despite this hip pain.  Hip discomfort is aggravated with flexion and improved when lying flat.  She has not had any extremity numbness or paresthesias, nausea, vomiting.  The history is provided by the patient. No language interpreter was used.  Fall       Home Medications Prior to Admission medications   Medication Sig Start Date End Date Taking? Authorizing Provider  acetaminophen (TYLENOL ARTHRITIS PAIN) 650 MG CR tablet Take 1,300 mg by mouth 2 (two) times a day.     [provider]  amLODipine (NORVASC) 5 MG tablet Take 5 mg by mouth daily. 12/07/18   [provider]  aspirin EC 81 MG tablet Take 81 mg by mouth daily.     [provider]  calcium-vitamin D (CALCIUM 500+D HIGH POTENCY) 500-400 MG-UNIT tablet Take 1 tablet by mouth daily.     [provider]  clopidogrel (PLAVIX) 75 MG tablet TAKE 1 TABLET BY MOUTH  DAILY Patient taking differently: Take 75 mg by mouth daily.  07/04/18   Nahser, Deloris Ping, MD  Cyanocobalamin (VITAMIN B-12) 2500 MCG SUBL Place 2,500 mcg under the tongue daily.    [provider]   Ensure (ENSURE) Take 237 mLs by mouth 2 (two) times daily as needed (for supplementation).    [provider]  gabapentin (NEURONTIN) 400 MG capsule TAKE 1 CAPSULE (400 MG TOTAL) BY MOUTH 4 (FOUR) TIMES DAILY. 07/10/18   Butch Penny, NP  ipratropium-albuterol (DUONEB) 0.5-2.5 (3) MG/3ML SOLN Take 3 mLs by nebulization. INHALE 1 VIAL VIA NEBULIZER EVERY 6 HOURS AS NEEDED FOR WHEEZING    [provider]  isosorbide mononitrate (IMDUR) 30 MG 24 hr tablet Take 0.5 tablets (15 mg total) by mouth daily. 12/13/18   Burnadette Pop, MD  levothyroxine (SYNTHROID, LEVOTHROID) 100 MCG tablet Take 100 mcg by mouth daily before breakfast.    [provider]  magnesium hydroxide (MILK OF MAGNESIA) 400 MG/5ML suspension Constipation (1 of 4): If no BM in 3 days, give 30 cc Milk of Magnesium p.o. x 1 dose in 24 hours as needed (Do not use standing constipation orders for residents with renal failure CFR less than 30. Contact MD for orders)    [provider]  metoprolol tartrate (LOPRESSOR) 25 MG tablet Take 1 tablet (25 mg total) by mouth 2 (two) times daily. 12/12/18   Burnadette Pop, MD  mirabegron ER (MYRBETRIQ) 50 MG TB24 tablet Take 50 mg by mouth daily.    [provider]  Multiple Vitamin (MULTIVITAMIN WITH MINERALS) TABS tablet Take 1 tablet by mouth daily.    [provider]  NITROSTAT 0.4 MG SL tablet DISSOLVE 1 TAB UNDER THE  TONGUE EVERY 5 MINUTES AS  NEEDED FOR CHEST PAIN. MAX  3 TOTAL DOSES/15MIN. CALL  911 IF NO RELIEF Patient taking differently: Place 0.4 mg under the tongue every 5 (five) minutes x 3 doses as needed for chest pain (AND CALL 9-1-1-, IF NO RELIEF).  09/11/18   Nahser, Deloris Ping, MD  NON FORMULARY Mighty Shake (chocolate if available) Drink 1 shake by mouth two times a day    [provider]  pantoprazole (PROTONIX) 20 MG tablet Take 20 mg by mouth daily before breakfast. DO NOT CRUSH    [provider]   simvastatin (ZOCOR) 20 MG tablet Take 20 mg by mouth at bedtime.     [provider]      Allergies    Penicillins, Codeine, Meperidine hcl, and Morphine    Review of Systems   Review of Systems Ten systems reviewed and are negative for acute change, except as noted in the HPI.    Physical Exam Updated Vital Signs BP 122/64 Comment: Manual  Pulse 90   Temp 98.4 F (36.9 C)   Resp 16   Ht 5\' 2"  (1.575 m)   Wt 60.3 kg   SpO2 96%   BMI 24.33 kg/m   Physical Exam Vitals and nursing note reviewed.  Constitutional:      General: She is not in acute distress.    Appearance: She is well-developed. She is not diaphoretic.     Comments: Nontoxic appearing, alert and conversant.  HENT:     Head: Normocephalic and atraumatic.     Comments: No battle's sign or raccoon's eyes. No hematoma or contusion to scalp. No skull instability. Eyes:     General: No scleral icterus.    Conjunctiva/sclera: Conjunctivae normal.  Neck:     Comments: No cervical midline TTP. No bony deformities, step offs, crepitus. Pulmonary:     Effort: Pulmonary effort is normal. No respiratory distress.     Comments: Respirations even and unlabored Musculoskeletal:        General: Normal range of motion.     Cervical back: Normal range of motion.     Comments: Normal ROM of the LLE. No leg shortening or malrotation.  Skin:    General: Skin is warm and dry.     Coloration: Skin is not pale.     Findings: No erythema or rash.  Neurological:     Mental Status: She is alert and oriented to person, place, and time.     Coordination: Coordination normal.  Psychiatric:        Behavior: Behavior normal.     ED Results / Procedures / Treatments   Labs (all labs ordered are listed, but only abnormal results are displayed) Labs Reviewed  I-STAT CHEM 8, ED - Abnormal; Notable for the following components:      Result Value   Glucose, Bld 117 (*)    Calcium, Ion 1.09 (*)    All other components  within normal limits  SAMPLE TO BLOOD BANK    EKG None  Radiology CT Cervical Spine Wo Contrast  Result Date: 11/26/2022 CLINICAL DATA:  Neck trauma (Age >= 65y) Fall in the shower. EXAM: CT CERVICAL SPINE WITHOUT CONTRAST TECHNIQUE: Multidetector CT imaging of the cervical spine was performed without intravenous contrast. Multiplanar CT image reconstructions were also generated. RADIATION DOSE REDUCTION: This exam was performed according to the departmental dose-optimization program which includes automated exposure control, adjustment  of the mA and/or kV according to patient size and/or use of iterative reconstruction technique. COMPARISON:  CT cervical spine 08/28/2018 FINDINGS: Alignment: Normal. Skull base and vertebrae: Anterior fusion C5 through C7 with interbody spacers. Intact hardware. No acute fracture. The dens and skull base are intact. Moderate C1-C2 degenerative change. Soft tissues and spinal canal: No prevertebral fluid or swelling. No visible canal hematoma. Disc levels: C3-C4 and C4-C5 disc space narrowing and spurring. Posterior spurring causes mass effect on the spinal canal, similar to prior exam. There is also posterior spurring at C6-C7 causing spinal canal narrowing. Moderate multilevel facet hypertrophy. Upper chest: No acute or unexpected findings. Other: Carotid calcifications. IMPRESSION: 1. No acute fracture or subluxation of the cervical spine. 2. Degenerative and postsurgical change. Electronically Signed   By: Narda Rutherford M.D.   On: 11/26/2022 23:22   DG Chest Port 1 View  Result Date: 11/26/2022 CLINICAL DATA:  Fall. EXAM: PORTABLE CHEST 1 VIEW COMPARISON:  12/10/2018 FINDINGS: Shallow inspiration. Heart size and pulmonary vascularity are normal. Blunting of the left costophrenic angles suggesting a small left pleural effusion. Lungs are clear. Calcification of the aorta. Degenerative changes in the spine. Postoperative changes in the cervical spine. IMPRESSION:  Small left pleural effusion.  No focal consolidation. Electronically Signed   By: Burman Nieves M.D.   On: 11/26/2022 23:10   DG Pelvis Portable  Result Date: 11/26/2022 CLINICAL DATA:  Fall yesterday.  Left hip pain. EXAM: PORTABLE PELVIS 1-2 VIEWS COMPARISON:  None Available. FINDINGS: Degenerative changes in the spine and hips. No evidence of acute fracture or dislocation. No focal bone lesion or bone destruction. SI joints and symphysis pubis are not displaced. Soft tissues are unremarkable. IMPRESSION: Degenerative changes in the hips.  No acute bony abnormalities. Electronically Signed   By: Burman Nieves M.D.   On: 11/26/2022 23:09    Procedures Procedures  {Document cardiac monitor, telemetry assessment procedure when appropriate:1}  Medications Ordered in ED Medications - No data to display  ED Course/ Medical Decision Making/ A&P Clinical Course as of 11/26/22 2327  Fri Nov 26, 2022  2327 CXR and pelvis Xray negative for acute changes or traumatic pathology. H/H normal. [KH]  2327 CT C-spine negative for acute process. CT head pending. [KH]    Clinical Course User Index [KH] Antony Madura, PA-C   {   Click here for ABCD2, HEART and other calculatorsREFRESH Note before signing :1}                          Medical Decision Making Amount and/or Complexity of Data Reviewed Radiology: ordered.   ***  {Document critical care time when appropriate:1} {Document review of labs and clinical decision tools ie heart score, Chads2Vasc2 etc:1}  {Document your independent review of radiology images, and any outside records:1} {Document your discussion with family members, caretakers, and with consultants:1} {Document social determinants of health affecting pt's care:1} {Document your decision making why or why not admission, treatments were needed:1} Final Clinical Impression(s) / ED Diagnoses Final diagnoses:  None    Rx / DC Orders ED Discharge Orders     None

## 2022-11-27 ENCOUNTER — Observation Stay (HOSPITAL_COMMUNITY): Payer: Medicare Other

## 2022-11-27 ENCOUNTER — Encounter (HOSPITAL_COMMUNITY): Payer: Self-pay | Admitting: Internal Medicine

## 2022-11-27 DIAGNOSIS — I509 Heart failure, unspecified: Secondary | ICD-10-CM | POA: Diagnosis not present

## 2022-11-27 DIAGNOSIS — W19XXXA Unspecified fall, initial encounter: Secondary | ICD-10-CM

## 2022-11-27 DIAGNOSIS — S066X0A Traumatic subarachnoid hemorrhage without loss of consciousness, initial encounter: Secondary | ICD-10-CM | POA: Diagnosis not present

## 2022-11-27 DIAGNOSIS — I609 Nontraumatic subarachnoid hemorrhage, unspecified: Secondary | ICD-10-CM

## 2022-11-27 DIAGNOSIS — I10 Essential (primary) hypertension: Secondary | ICD-10-CM | POA: Diagnosis not present

## 2022-11-27 DIAGNOSIS — N3000 Acute cystitis without hematuria: Secondary | ICD-10-CM

## 2022-11-27 DIAGNOSIS — Y92009 Unspecified place in unspecified non-institutional (private) residence as the place of occurrence of the external cause: Secondary | ICD-10-CM

## 2022-11-27 DIAGNOSIS — E039 Hypothyroidism, unspecified: Secondary | ICD-10-CM | POA: Diagnosis not present

## 2022-11-27 DIAGNOSIS — R32 Unspecified urinary incontinence: Secondary | ICD-10-CM

## 2022-11-27 DIAGNOSIS — E782 Mixed hyperlipidemia: Secondary | ICD-10-CM

## 2022-11-27 DIAGNOSIS — K219 Gastro-esophageal reflux disease without esophagitis: Secondary | ICD-10-CM

## 2022-11-27 LAB — COMPREHENSIVE METABOLIC PANEL
ALT: 21 U/L (ref 0–44)
AST: 24 U/L (ref 15–41)
Albumin: 3.3 g/dL — ABNORMAL LOW (ref 3.5–5.0)
Alkaline Phosphatase: 45 U/L (ref 38–126)
Anion gap: 10 (ref 5–15)
BUN: 8 mg/dL (ref 8–23)
CO2: 22 mmol/L (ref 22–32)
Calcium: 8.8 mg/dL — ABNORMAL LOW (ref 8.9–10.3)
Chloride: 102 mmol/L (ref 98–111)
Creatinine, Ser: 0.71 mg/dL (ref 0.44–1.00)
GFR, Estimated: >60 mL/min (ref >60)
Glucose, Bld: 111 mg/dL — ABNORMAL HIGH (ref 70–99)
Potassium: 3.7 mmol/L (ref 3.5–5.1)
Sodium: 134 mmol/L — ABNORMAL LOW (ref 135–145)
Total Bilirubin: 0.9 mg/dL (ref 0.3–1.2)
Total Protein: 6.2 g/dL — ABNORMAL LOW (ref 6.5–8.1)

## 2022-11-27 LAB — CBC WITH DIFFERENTIAL/PLATELET
Abs Immature Granulocytes: 0.03 10*3/uL (ref 0.00–0.07)
Abs Immature Granulocytes: 0.02 10*3/uL (ref 0.00–0.07)
Basophils Absolute: 0 10*3/uL (ref 0.0–0.1)
Basophils Absolute: 0 10*3/uL (ref 0.0–0.1)
Basophils Relative: 0 %
Basophils Relative: 0 %
Eosinophils Absolute: 0.1 10*3/uL (ref 0.0–0.5)
Eosinophils Absolute: 0.1 10*3/uL (ref 0.0–0.5)
Eosinophils Relative: 1 %
Eosinophils Relative: 1 %
HCT: 40.8 % (ref 36.0–46.0)
HCT: 39.3 % (ref 36.0–46.0)
Hemoglobin: 13.4 g/dL (ref 12.0–15.0)
Hemoglobin: 12.9 g/dL (ref 12.0–15.0)
Immature Granulocytes: 0 %
Immature Granulocytes: 0 %
Lymphocytes Relative: 14 %
Lymphocytes Relative: 17 %
Lymphs Abs: 1 10*3/uL (ref 0.7–4.0)
Lymphs Abs: 1.3 10*3/uL (ref 0.7–4.0)
MCH: 28.6 pg (ref 26.0–34.0)
MCH: 28.5 pg (ref 26.0–34.0)
MCHC: 32.8 g/dL (ref 30.0–36.0)
MCHC: 32.8 g/dL (ref 30.0–36.0)
MCV: 87 fL (ref 80.0–100.0)
MCV: 86.8 fL (ref 80.0–100.0)
Monocytes Absolute: 0.7 10*3/uL (ref 0.1–1.0)
Monocytes Absolute: 0.8 10*3/uL (ref 0.1–1.0)
Monocytes Relative: 9 %
Monocytes Relative: 10 %
Neutro Abs: 5.8 10*3/uL (ref 1.7–7.7)
Neutro Abs: 5.4 10*3/uL (ref 1.7–7.7)
Neutrophils Relative %: 76 %
Neutrophils Relative %: 72 %
Platelets: 124 10*3/uL — ABNORMAL LOW (ref 150–400)
Platelets: 123 10*3/uL — ABNORMAL LOW (ref 150–400)
RBC: 4.69 MIL/uL (ref 3.87–5.11)
RBC: 4.53 MIL/uL (ref 3.87–5.11)
RDW: 14.4 % (ref 11.5–15.5)
RDW: 14.3 % (ref 11.5–15.5)
WBC: 7.7 10*3/uL (ref 4.0–10.5)
WBC: 7.6 10*3/uL (ref 4.0–10.5)
nRBC: 0 % (ref 0.0–0.2)
nRBC: 0 % (ref 0.0–0.2)

## 2022-11-27 LAB — ECHOCARDIOGRAM COMPLETE
Area-P 1/2: 8.72 cm2
Height: 62 in
MV M vel: 1.09 m/s
MV Peak grad: 4.8 mmHg
S' Lateral: 2 cm
Weight: 2128 oz

## 2022-11-27 LAB — URINALYSIS, ROUTINE W REFLEX MICROSCOPIC
Bilirubin Urine: NEGATIVE
Glucose, UA: NEGATIVE mg/dL
Hgb urine dipstick: NEGATIVE
Ketones, ur: NEGATIVE mg/dL
Nitrite: NEGATIVE
Protein, ur: NEGATIVE mg/dL
Specific Gravity, Urine: 1.004 — ABNORMAL LOW (ref 1.005–1.030)
WBC, UA: >50 WBC/hpf (ref 0–5)
pH: 7 (ref 5.0–8.0)

## 2022-11-27 LAB — MAGNESIUM: Magnesium: 1.5 mg/dL — ABNORMAL LOW (ref 1.7–2.4)

## 2022-11-27 LAB — PROTIME-INR
INR: 1 (ref 0.8–1.2)
Prothrombin Time: 13.7 seconds (ref 11.4–15.2)

## 2022-11-27 MED ORDER — SODIUM CHLORIDE 0.9 % IV SOLN
1.0000 g | INTRAVENOUS | Status: DC
  Administered 2022-11-27 – 2022-11-30 (×4): 1 g via INTRAVENOUS
  Filled 2022-11-27 (×4): qty 10

## 2022-11-27 MED ORDER — OXYCODONE HCL 5 MG PO TABS: 5.0000 mg | ORAL_TABLET | Freq: Four times a day (QID) | ORAL | Status: DC | PRN

## 2022-11-27 MED ORDER — OXYCODONE HCL 5 MG PO TABS
5.0000 mg | ORAL_TABLET | Freq: Four times a day (QID) | ORAL | Status: DC | PRN
  Administered 2022-11-27: 5 mg via ORAL
  Filled 2022-11-27: qty 1

## 2022-11-27 MED ORDER — TRIAMCINOLONE ACETONIDE 0.5 % EX CREA
1.0000 | TOPICAL_CREAM | Freq: Two times a day (BID) | CUTANEOUS | Status: DC
  Administered 2022-11-27 – 2022-11-30 (×6): 1 via TOPICAL
  Filled 2022-11-27: qty 15

## 2022-11-27 MED ORDER — GABAPENTIN 400 MG PO CAPS
400.0000 mg | ORAL_CAPSULE | Freq: Four times a day (QID) | ORAL | Status: DC
  Administered 2022-11-27 – 2022-11-30 (×13): 400 mg via ORAL
  Filled 2022-11-27 (×13): qty 1

## 2022-11-27 MED ORDER — SIMVASTATIN 20 MG PO TABS: 20.0000 mg | ORAL_TABLET | Freq: Every day | ORAL | Status: DC

## 2022-11-27 MED ORDER — LEVOTHYROXINE SODIUM 88 MCG PO TABS
88.0000 ug | ORAL_TABLET | Freq: Every day | ORAL | Status: DC
  Administered 2022-11-27 – 2022-11-30 (×4): 88 ug via ORAL
  Filled 2022-11-27 (×4): qty 1

## 2022-11-27 MED ORDER — ACETAMINOPHEN 650 MG RE SUPP: 650.0000 mg | Freq: Four times a day (QID) | RECTAL | Status: DC | PRN

## 2022-11-27 MED ORDER — MELATONIN 3 MG PO TABS
3.0000 mg | ORAL_TABLET | Freq: Every evening | ORAL | Status: DC | PRN
  Administered 2022-11-27 – 2022-11-29 (×2): 3 mg via ORAL
  Filled 2022-11-27 (×2): qty 1

## 2022-11-27 MED ORDER — ACETAMINOPHEN 500 MG PO TABS
1000.0000 mg | ORAL_TABLET | Freq: Three times a day (TID) | ORAL | Status: DC
  Administered 2022-11-27 – 2022-11-30 (×10): 1000 mg via ORAL
  Filled 2022-11-27 (×10): qty 2

## 2022-11-27 MED ORDER — TRAMADOL HCL 50 MG PO TABS
25.0000 mg | ORAL_TABLET | Freq: Four times a day (QID) | ORAL | Status: DC | PRN
  Administered 2022-11-29: 25 mg via ORAL
  Filled 2022-11-27: qty 1

## 2022-11-27 MED ORDER — POLYETHYLENE GLYCOL 3350 17 G PO PACK: 17.0000 g | PACK | Freq: Every day | ORAL | Status: DC | PRN

## 2022-11-27 MED ORDER — ONDANSETRON HCL 4 MG/2ML IJ SOLN: 4.0000 mg | Freq: Four times a day (QID) | INTRAMUSCULAR | Status: DC | PRN

## 2022-11-27 MED ORDER — METOPROLOL TARTRATE 25 MG PO TABS
25.0000 mg | ORAL_TABLET | Freq: Two times a day (BID) | ORAL | Status: DC
  Administered 2022-11-27 – 2022-11-30 (×7): 25 mg via ORAL
  Filled 2022-11-27 (×7): qty 1

## 2022-11-27 MED ORDER — FUROSEMIDE 10 MG/ML IJ SOLN
20.0000 mg | Freq: Once | INTRAMUSCULAR | Status: AC
  Administered 2022-11-27: 20 mg via INTRAVENOUS
  Filled 2022-11-27: qty 2

## 2022-11-27 MED ORDER — ISOSORBIDE MONONITRATE ER 30 MG PO TB24
15.0000 mg | ORAL_TABLET | Freq: Every day | ORAL | Status: DC
  Administered 2022-11-27 – 2022-11-28 (×2): 15 mg via ORAL
  Filled 2022-11-27 (×2): qty 1

## 2022-11-27 MED ORDER — FESOTERODINE FUMARATE ER 4 MG PO TB24
4.0000 mg | ORAL_TABLET | Freq: Every day | ORAL | Status: DC
  Administered 2022-11-28 – 2022-11-30 (×3): 4 mg via ORAL
  Filled 2022-11-27 (×4): qty 1

## 2022-11-27 MED ORDER — AMLODIPINE BESYLATE 5 MG PO TABS
5.0000 mg | ORAL_TABLET | Freq: Every day | ORAL | Status: DC
  Administered 2022-11-27 – 2022-11-28 (×2): 5 mg via ORAL
  Filled 2022-11-27 (×2): qty 1

## 2022-11-27 MED ORDER — PANTOPRAZOLE SODIUM 20 MG PO TBEC
20.0000 mg | DELAYED_RELEASE_TABLET | Freq: Every day | ORAL | Status: DC
  Administered 2022-11-27 – 2022-11-30 (×4): 20 mg via ORAL
  Filled 2022-11-27 (×4): qty 1

## 2022-11-27 MED ORDER — METHOCARBAMOL 500 MG PO TABS
500.0000 mg | ORAL_TABLET | Freq: Three times a day (TID) | ORAL | Status: DC | PRN
  Administered 2022-11-27: 500 mg via ORAL
  Filled 2022-11-27: qty 1

## 2022-11-27 MED ORDER — MAGNESIUM SULFATE 2 GM/50ML IV SOLN
2.0000 g | Freq: Once | INTRAVENOUS | Status: AC
  Administered 2022-11-27: 2 g via INTRAVENOUS
  Filled 2022-11-27: qty 50

## 2022-11-27 MED ORDER — HYDRALAZINE HCL 20 MG/ML IJ SOLN: 10.0000 mg | INTRAMUSCULAR | Status: DC | PRN

## 2022-11-27 MED ORDER — ACETAMINOPHEN 325 MG PO TABS: 650.0000 mg | ORAL_TABLET | Freq: Four times a day (QID) | ORAL | Status: DC | PRN

## 2022-11-27 NOTE — ED Provider Notes (Signed)
1241 case d/w Dr. Franky Macho who advised a repeat head CT in 6 hours    Bingham Millette, MD 11/27/22 0981

## 2022-11-27 NOTE — Progress Notes (Signed)
  Echocardiogram 2D Echocardiogram has been performed.  Kimberly Bass 11/27/2022, 3:15 PM

## 2022-11-27 NOTE — ED Notes (Signed)
ED TO INPATIENT HANDOFF REPORT  ED Nurse Name and Phone #:  Theophilus Bones [phone]  S Name/Age/Gender Kimberly Bass 89 y.o. female Room/Bed: 027C/027C  Code Status   Code Status: DNR  Home/SNF/Other Skilled nursing facility Patient oriented to: self, place, time, and situation Is this baseline? Yes   Triage Complete: Triage complete  Chief Complaint Subarachnoid bleed (HCC) [I60.9]  Triage Note Pt fell at [facility] while in the shower. + headstrike. + blood thinners. Previous fall yesterday with L hip pain, declined to come to ED   Allergies Allergies  Allergen Reactions   Penicillins Shortness Of Breath, Nausea Only and Rash    Has patient had a PCN reaction causing immediate rash, facial/tongue/throat swelling, SOB or lightheadedness with hypotension: Yes Has patient had a PCN reaction causing severe rash involving mucus membranes or skin necrosis: Yes Has patient had a PCN reaction that required hospitalization: Yes Has patient had a PCN reaction occurring within the last 10 years: >50 years ago If all of the above answers are "NO", then may proceed with Cephalosporin use.    Codeine Nausea Only   Meperidine Hcl Nausea Only   Morphine Nausea Only    Level of Care/Admitting Diagnosis ED Disposition     ED Disposition  Admit   Condition  --   Comment  Hospital Area: [facility] [[id number]]  Level of Care: Progressive [102]  Admit to Progressive based on following criteria: MULTISYSTEM THREATS such as stable sepsis, metabolic/electrolyte imbalance with or without encephalopathy that is responding to early treatment.  May place patient in observation at [facility] or [facility] if equivalent level of care is available:: No  Covid Evaluation: Asymptomatic - no recent exposure (last 10 days) testing not required  Diagnosis: Subarachnoid bleed [facility]) [[id number]]  Admitting Physician: Angie Fava [[id number]]  Attending Physician: Angie Fava  [[id number]]          B Medical/Surgery History Past Medical History:  Diagnosis Date   Arthritis    "mild; in all my joints" (04/27/2017)   Coronary artery disease    Cystitis with hematuria 04/26/2017   Gait disorder    H/O radioactive iodine thyroid ablation    Hx of transient ischemic attack (TIA)    Hyperlipidemia    Hypertension    "not since MI in 2007" (04/27/2017)   Hyperthyroidism    H/O radioactive iodine thyroid ablation [Z92.3]   Hypothyroidism    Lower extremity myoclonus    Lumbosacral spondylosis    Metabolic encephalopathy    Migraine    "I've had one in my lifetime" (04/27/2017)   Mild obesity    Myocardial infarction (HCC) 12/2005   Myoclonus    Lower extremity involvement   Obesity    Pneumonia    "lots when I was a small child; not at all since" (04/27/2017)   Rhabdomyolysis    Seizures (HCC)    History is questionable   Stroke [facility])    Thyroid disease    Past Surgical History:  Procedure Laterality Date   ABDOMINAL HYSTERECTOMY  1972   for uterine fibroids/notes 11/10/2010   ANTERIOR CERVICAL DECOMP/DISCECTOMY FUSION  03/2004   [name] 11/10/2010   APPENDECTOMY     BACK SURGERY     BREAST BIOPSY Right 1996   nodule resected/notes 11/10/2010   CARDIAC CATHETERIZATION  01/03/2006   CATARACT EXTRACTION W/ INTRAOCULAR LENS  IMPLANT, BILATERAL Bilateral    COLONOSCOPY  11/2003   [name] 11/10/2010   ESOPHAGOGASTRODUODENOSCOPY  09/2006   /  notes 11/10/2010   LUMBAR DISC SURGERY  1990s X 1; 05/2003   "a nerve was pinching"; /notes 11/10/2010   OVARY SURGERY Left 1999   benign tumor resected /notes 11/10/2010   TONSILLECTOMY  ~ 1947     A IV Location/Drains/Wounds Patient Lines/Drains/Airways Status     Active Line/Drains/Airways     Name Placement date Placement time Site Days   Peripheral IV 11/27/22 18 G Left Antecubital 11/27/22  0156  Antecubital  less than 1            Intake/Output Last 24 hours  Intake/Output Summary (Last 24  hours) at 11/27/2022 1610 Last data filed at 11/27/2022 0530 Gross per 24 hour  Intake 100 ml  Output --  Net 100 ml    Labs/Imaging Results for orders placed or performed during the hospital encounter of 11/26/22 (from the past 48 hour(s))  Protime-INR     Status: None   Collection Time: 11/26/22 10:45 PM  Result Value Ref Range   Prothrombin Time 13.7 11.4 - 15.2 seconds   INR 1.0 0.8 - 1.2    Comment: (NOTE) INR goal varies based on device and disease states. Performed at [facility], [address], [place], [place] [address]   CBC with Differential     Status: Abnormal   Collection Time: 11/26/22 10:45 PM  Result Value Ref Range   WBC 7.7 4.0 - 10.5 K/uL   RBC 4.69 3.87 - 5.11 MIL/uL   Hemoglobin 13.4 12.0 - 15.0 g/dL   HCT 40.9 81.1 - 91.4 %   MCV 87.0 80.0 - 100.0 fL   MCH 28.6 26.0 - 34.0 pg   MCHC 32.8 30.0 - 36.0 g/dL   RDW 78.2 95.6 - 21.3 %   Platelets 124 (L) 150 - 400 K/uL    Comment: REPEATED TO VERIFY   nRBC 0.0 0.0 - 0.2 %   Neutrophils Relative % 76 %   Neutro Abs 5.8 1.7 - 7.7 K/uL   Lymphocytes Relative 14 %   Lymphs Abs 1.0 0.7 - 4.0 K/uL   Monocytes Relative 9 %   Monocytes Absolute 0.7 0.1 - 1.0 K/uL   Eosinophils Relative 1 %   Eosinophils Absolute 0.1 0.0 - 0.5 K/uL   Basophils Relative 0 %   Basophils Absolute 0.0 0.0 - 0.1 K/uL   Immature Granulocytes 0 %   Abs Immature Granulocytes 0.03 0.00 - 0.07 K/uL    Comment: Performed at [facility], [address] [place], [place] [address]  Sample to Blood Bank     Status: None   Collection Time: 11/26/22 10:49 PM  Result Value Ref Range   Blood Bank Specimen SAMPLE AVAILABLE FOR TESTING    Sample Expiration      11/29/2022,2359 Performed at [facility], [address] [place], [place] [address]   I-stat chem 8, ED (not at [facility], [facility] or [facility])     Status: Abnormal   Collection Time: 11/26/22 10:58 PM  Result Value Ref Range   Sodium 135 135 - 145 mmol/L   Potassium 3.8 3.5  - 5.1 mmol/L   Chloride 99 98 - 111 mmol/L   BUN 11 8 - 23 mg/dL   Creatinine, Ser 2.95 0.44 - 1.00 mg/dL   Glucose, Bld 284 (H) 70 - 99 mg/dL    Comment: Glucose reference range applies only to samples taken after fasting for at least 8 hours.   Calcium, Ion 1.09 (L) 1.15 - 1.40  mmol/L   TCO2 25 22 - 32 mmol/L   Hemoglobin 13.9 12.0 - 15.0 g/dL   HCT 69.6 29.5 - 28.4 %  Urinalysis, Routine w reflex microscopic -Urine, Clean Catch     Status: Abnormal   Collection Time: 11/27/22  1:41 AM  Result Value Ref Range   Color, Urine STRAW (A) YELLOW   APPearance CLEAR CLEAR   Specific Gravity, Urine 1.004 (L) 1.005 - 1.030   pH 7.0 5.0 - 8.0   Glucose, UA NEGATIVE NEGATIVE mg/dL   Hgb urine dipstick NEGATIVE NEGATIVE   Bilirubin Urine NEGATIVE NEGATIVE   Ketones, ur NEGATIVE NEGATIVE mg/dL   Protein, ur NEGATIVE NEGATIVE mg/dL   Nitrite NEGATIVE NEGATIVE   Leukocytes,Ua LARGE (A) NEGATIVE   RBC / HPF 0-5 0 - 5 RBC/hpf   WBC, UA >50 0 - 5 WBC/hpf   Bacteria, UA RARE (A) NONE SEEN   Squamous Epithelial / HPF 0-5 0 - 5 /HPF   Mucus PRESENT     Comment: Performed at [facility], [address], [place], [place] [address]  CBC with Differential/Platelet     Status: Abnormal   Collection Time: 11/27/22  2:06 AM  Result Value Ref Range   WBC 7.6 4.0 - 10.5 K/uL   RBC 4.53 3.87 - 5.11 MIL/uL   Hemoglobin 12.9 12.0 - 15.0 g/dL   HCT 01.0 27.2 - 53.6 %   MCV 86.8 80.0 - 100.0 fL   MCH 28.5 26.0 - 34.0 pg   MCHC 32.8 30.0 - 36.0 g/dL   RDW 64.4 03.4 - 74.2 %   Platelets 123 (L) 150 - 400 K/uL    Comment: REPEATED TO VERIFY   nRBC 0.0 0.0 - 0.2 %   Neutrophils Relative % 72 %   Neutro Abs 5.4 1.7 - 7.7 K/uL   Lymphocytes Relative 17 %   Lymphs Abs 1.3 0.7 - 4.0 K/uL   Monocytes Relative 10 %   Monocytes Absolute 0.8 0.1 - 1.0 K/uL   Eosinophils Relative 1 %   Eosinophils Absolute 0.1 0.0 - 0.5 K/uL   Basophils Relative 0 %   Basophils Absolute 0.0 0.0 - 0.1 K/uL   Immature  Granulocytes 0 %   Abs Immature Granulocytes 0.02 0.00 - 0.07 K/uL    Comment: Performed at [facility], [address], [place], [place] [address]  Comprehensive metabolic panel     Status: Abnormal   Collection Time: 11/27/22  2:06 AM  Result Value Ref Range   Sodium 134 (L) 135 - 145 mmol/L   Potassium 3.7 3.5 - 5.1 mmol/L   Chloride 102 98 - 111 mmol/L   CO2 22 22 - 32 mmol/L   Glucose, Bld 111 (H) 70 - 99 mg/dL    Comment: Glucose reference range applies only to samples taken after fasting for at least 8 hours.   BUN 8 8 - 23 mg/dL   Creatinine, Ser 8.75 0.44 - 1.00 mg/dL   Calcium 8.8 (L) 8.9 - 10.3 mg/dL   Total Protein 6.2 (L) 6.5 - 8.1 g/dL   Albumin 3.3 (L) 3.5 - 5.0 g/dL   AST 24 15 - 41 U/L   ALT 21 0 - 44 U/L   Alkaline Phosphatase 45 38 - 126 U/L   Total Bilirubin 0.9 0.3 - 1.2 mg/dL   GFR, Estimated >64 >33 mL/min    Comment: (NOTE) Calculated using the CKD-EPI Creatinine Equation (2021)    Anion gap 10 5 - 15  Comment: Performed at [facility], [address], [place], [place] [address]  Magnesium     Status: Abnormal   Collection Time: 11/27/22  2:06 AM  Result Value Ref Range   Magnesium 1.5 (L) 1.7 - 2.4 mg/dL    Comment: Performed at [facility], [address] [place], [place] [address]   CT HEAD WO CONTRAST ( )  Result Date: 11/27/2022 CLINICAL DATA:  89 year old female status post fall with trace subarachnoid hemorrhage suspected in the right frontal lobe. EXAM: CT HEAD WITHOUT CONTRAST TECHNIQUE: Contiguous axial images were obtained from the base of the skull through the vertex without intravenous contrast. RADIATION DOSE REDUCTION: This exam was performed according to the departmental dose-optimization program which includes automated exposure control, adjustment of the mA and/or kV according to patient size and/or use of iterative reconstruction technique. COMPARISON:  Head CT yesterday. FINDINGS: Brain: No midline shift, mass  effect, or evidence of intracranial mass lesion. No ventriculomegaly. No intraventricular hemorrhage. No convincing extra-axial blood or acute intracranial hemorrhage on these images. Stable gray-white matter differentiation throughout the brain. Small area of chronic appearing encephalomalacia left middle frontal gyrus (coronal image 33). No convincing acute cortically based infarct. Vascular: Calcified atherosclerosis at the skull base. No suspicious intracranial vascular hyperdensity. Skull: TMJ degeneration.  No acute osseous abnormality identified. Sinuses/Orbits: Visualized paranasal sinuses and mastoids are stable and well aerated. Other: Stable orbit and scalp soft tissues. IMPRESSION: 1. No convincing intracranial hemorrhage on this exam. 2. No acute intracranial abnormality. Small area of chronic encephalomalacia in the left middle frontal gyrus. Electronically Signed   By: Odessa Fleming M.D.   On: 11/27/2022 06:48   CT HEAD WO CONTRAST ( )  Result Date: 11/26/2022 CLINICAL DATA:  Fall EXAM: CT HEAD WITHOUT CONTRAST TECHNIQUE: Contiguous axial images were obtained from the base of the skull through the vertex without intravenous contrast. RADIATION DOSE REDUCTION: This exam was performed according to the departmental dose-optimization program which includes automated exposure control, adjustment of the mA and/or kV according to patient size and/or use of iterative reconstruction technique. COMPARISON:  04/27/2017 FINDINGS: Brain: Possible trace subarachnoid hemorrhage in the right frontal lobe (series 3, image 23). No evidence of parenchymal or subdural hemorrhage. No evidence of acute infarct, mass, mass effect, or midline shift. No hydrocephalus. Vascular: No hyperdense vessel. Atherosclerotic calcifications in the intracranial carotid and vertebral arteries. Skull: Normal. Negative for fracture or focal lesion. Sinuses/Orbits: No acute finding. Other: The mastoid air cells are well aerated. IMPRESSION:  Possible trace subarachnoid hemorrhage in the right frontal lobe. No mass effect. These results were called by telephone at the time of interpretation on 11/26/2022 at 11:48 pm to provider [facility], who verbally acknowledged these results. Electronically Signed   By: Wiliam Ke M.D.   On: 11/26/2022 23:48   CT Cervical Spine Wo Contrast  Result Date: 11/26/2022 CLINICAL DATA:  Neck trauma (Age >= 65y) Fall in the shower. EXAM: CT CERVICAL SPINE WITHOUT CONTRAST TECHNIQUE: Multidetector CT imaging of the cervical spine was performed without intravenous contrast. Multiplanar CT image reconstructions were also generated. RADIATION DOSE REDUCTION: This exam was performed according to the departmental dose-optimization program which includes automated exposure control, adjustment of the mA and/or kV according to patient size and/or use of iterative reconstruction technique. COMPARISON:  CT cervical spine 08/28/2018 FINDINGS: Alignment: Normal. Skull base and vertebrae: Anterior fusion C5 through C7 with interbody spacers. Intact hardware. No acute fracture. The dens and skull base are intact. Moderate C1-C2 degenerative  change. Soft tissues and spinal canal: No prevertebral fluid or swelling. No visible canal hematoma. Disc levels: C3-C4 and C4-C5 disc space narrowing and spurring. Posterior spurring causes mass effect on the spinal canal, similar to prior exam. There is also posterior spurring at C6-C7 causing spinal canal narrowing. Moderate multilevel facet hypertrophy. Upper chest: No acute or unexpected findings. Other: Carotid calcifications. IMPRESSION: 1. No acute fracture or subluxation of the cervical spine. 2. Degenerative and postsurgical change. Electronically Signed   By: Narda Rutherford M.D.   On: 11/26/2022 23:22   DG Chest Port 1 View  Result Date: 11/26/2022 CLINICAL DATA:  Fall. EXAM: PORTABLE CHEST 1 VIEW COMPARISON:  12/10/2018 FINDINGS: Shallow inspiration. Heart size and pulmonary  vascularity are normal. Blunting of the left costophrenic angles suggesting a small left pleural effusion. Lungs are clear. Calcification of the aorta. Degenerative changes in the spine. Postoperative changes in the cervical spine. IMPRESSION: Small left pleural effusion.  No focal consolidation. Electronically Signed   By: Burman Nieves M.D.   On: 11/26/2022 23:10   DG Pelvis Portable  Result Date: 11/26/2022 CLINICAL DATA:  Fall yesterday.  Left hip pain. EXAM: PORTABLE PELVIS 1-2 VIEWS COMPARISON:  None Available. FINDINGS: Degenerative changes in the spine and hips. No evidence of acute fracture or dislocation. No focal bone lesion or bone destruction. SI joints and symphysis pubis are not displaced. Soft tissues are unremarkable. IMPRESSION: Degenerative changes in the hips.  No acute bony abnormalities. Electronically Signed   By: Burman Nieves M.D.   On: 11/26/2022 23:09    Pending Labs Unresulted Labs (From admission, onward)     Start     Ordered   11/27/22 0310  Culture, OB Urine  Add-on,   AD        11/27/22 0309            Vitals/Pain Today's Vitals   11/27/22 0440 11/27/22 0448 11/27/22 0600 11/27/22 0609  BP:  128/78 124/63   Pulse:  79 78   Resp:  16 (!) 34   Temp:  98.4 F (36.9 C) 98 F (36.7 C)   TempSrc:  Oral Oral   SpO2: (!) 88% 96% 96%   Weight:      Height:      PainSc:  0-No pain 0-No pain 0-No pain    Isolation Precautions No active isolations  Medications Medications  acetaminophen (TYLENOL) tablet 650 mg (has no administration in time range)    Or  acetaminophen (TYLENOL) suppository 650 mg (has no administration in time range)  melatonin tablet 3 mg (has no administration in time range)  ondansetron (ZOFRAN) injection 4 mg (has no administration in time range)  cefTRIAXone (ROCEPHIN) 1 g in sodium chloride 0.9 % 100 mL IVPB (0 g Intravenous Stopped 11/27/22 0530)  amLODipine (NORVASC) tablet 5 mg (has no administration in time range)   levothyroxine (SYNTHROID) tablet 88 mcg (88 mcg Oral Given 11/27/22 0617)  metoprolol tartrate (LOPRESSOR) tablet 25 mg (has no administration in time range)  pantoprazole (PROTONIX) EC tablet 20 mg (has no administration in time range)  simvastatin (ZOCOR) tablet 20 mg (has no administration in time range)  hydrALAZINE (APRESOLINE) injection 10 mg (has no administration in time range)    Mobility walks     Focused Assessments Musculoskeletal   R Recommendations: See Admitting Provider Note  Report given to:   Additional Notes:

## 2022-11-27 NOTE — H&P (Signed)
History and Physical      Kimberly Bass:[id number] DOB: 1926/05/25 DOA: 11/26/2022; DOS: 11/27/2022  PCP: Merri Brunette, MD  Patient coming from: snf  I have personally briefly reviewed patient's old medical records in [facility]  Chief Complaint: Fall  HPI: Kimberly Bass is a 89 y.o. female with medical history significant for essential pretension, hyperlipidemia, acquired hypothyroidism, who is admitted to [facility] on 11/26/2022 with potential small subarachnoid hemorrhage after ground-level mechanical fall, after presenting from SNF to [facility] ED for evaluation of fall.  The following history is provided by the patient as well as her son, who is present bedside, in addition to my discussions with the EDP and via chart review.  Patient experienced a fall earlier today in which she tripped while attempting to ambulate at her facility resulting in a loss of balance in which she fell forward, striking the right portion of her head on a wall in front of her.  Denies any associated loss of consciousness.  No resultant neck pain or acute arthralgias or myalgias as a result of this fall.  Not associate with any preceding or ensuing chest pain, shortness of breath, palpitations, dizziness, presyncope, nausea, vomiting.  EDP reviewed MAR from facility, noting that the patient is on dual antiplatelet therapy with daily baby aspirin and Plavix.  Not on a formal anticoagulation.   Patient denies any associated acute focal weakness, acute focal numbness, paresthesias, vertigo, facial droop, acute change in vision, dysphagia.      ED Course:  Vital signs in the ED were notable for the following: Afebrile; heart rate 80s to 90s; systolic blood pressures in the 120s to 150s; respiratory rate 14-16, oxygen saturation 94 to 96% on room air.  Labs were notable for the following: Of note, no BMP or CMP resulted at this time; CBC notable for white blood cell count 7700, hemoglobin  13.4.  INR 1.0.  Urinalysis notable for greater than 50 white blood cells, large leukocyte esterase, no squamous epithelial cells.  Imaging and additional notable ED work-up: Plain film of the pelvis showed no evidence of acute bony abnormality, including no evidence of acute fracture or dislocation.  1 view chest x-ray, performed radiology read, shows small left pleural effusion, but otherwise no evidence of acute cardiopulmonary process, including no evidence of infiltrate, edema, or pneumothorax.  CT head showed possible trace subarachnoid hemorrhage in the right frontal lobe, without any evidence of mass effect or cerebral edema, but demonstrated no evidence of acute infarct.  CT cervical spine showed no evidence of acute cervical spine fracture or subluxation injury.  EDP discussed case/imaging with with on-call neurosurgery, Dr. Franky Macho, who recommended repeat CT head in 6 hours, but otherwise no indication for neurosurgical intervention.   While in the ED, the following were administered: None  Subsequently, the patient was admitted observation for potential small subarachnoid hemorrhage stemming from ground-level mechanical fall, with potential underlying urinary tract infection.     Review of Systems: As per HPI otherwise 10 point review of systems negative.   Past Medical History:  Diagnosis Date   Arthritis    "mild; in all my joints" (04/27/2017)   Coronary artery disease    Cystitis with hematuria 04/26/2017   Gait disorder    H/O radioactive iodine thyroid ablation    Hx of transient ischemic attack (TIA)    Hyperlipidemia    Hypertension    "not since MI in 2007" (04/27/2017)   Hyperthyroidism  H/O radioactive iodine thyroid ablation [Z92.3]   Hypothyroidism    Lower extremity myoclonus    Lumbosacral spondylosis    Metabolic encephalopathy    Migraine    "I've had one in my lifetime" (04/27/2017)   Mild obesity    Myocardial infarction (HCC) 12/2005   Myoclonus     Lower extremity involvement   Obesity    Pneumonia    "lots when I was a small child; not at all since" (04/27/2017)   Rhabdomyolysis    Seizures (HCC)    History is questionable   Stroke [facility])    Thyroid disease     Past Surgical History:  Procedure Laterality Date   ABDOMINAL HYSTERECTOMY  1972   for uterine fibroids/notes 11/10/2010   ANTERIOR CERVICAL DECOMP/DISCECTOMY FUSION  03/2004   [name] 11/10/2010   APPENDECTOMY     BACK SURGERY     BREAST BIOPSY Right 1996   nodule resected/notes 11/10/2010   CARDIAC CATHETERIZATION  01/03/2006   CATARACT EXTRACTION W/ INTRAOCULAR LENS  IMPLANT, BILATERAL Bilateral    COLONOSCOPY  11/2003   [name] 11/10/2010   ESOPHAGOGASTRODUODENOSCOPY  09/2006   [name] 11/10/2010   LUMBAR DISC SURGERY  1990s X 1; 05/2003   "a nerve was pinching"; /notes 11/10/2010   OVARY SURGERY Left 1999   benign tumor resected /notes 11/10/2010   TONSILLECTOMY  ~ 1947    Social History:  reports that she has never smoked. She has never used smokeless tobacco. She reports that she does not drink alcohol and does not use drugs.   Allergies  Allergen Reactions   Penicillins Shortness Of Breath, Nausea Only and Rash    Has patient had a PCN reaction causing immediate rash, facial/tongue/throat swelling, SOB or lightheadedness with hypotension: Yes Has patient had a PCN reaction causing severe rash involving mucus membranes or skin necrosis: Yes Has patient had a PCN reaction that required hospitalization: Yes Has patient had a PCN reaction occurring within the last 10 years: >50 years ago If all of the above answers are "NO", then may proceed with Cephalosporin use.    Codeine Nausea Only   Meperidine Hcl Nausea Only   Morphine Nausea Only    Family History  Problem Relation Age of Onset   Heart disease Father    Heart attack Mother    Stroke Sister    Heart attack Sister    Heart attack Brother        Possible   Diabetes Sister     Family  history reviewed and not pertinent    Prior to Admission medications   Medication Sig Start Date End Date Taking? Authorizing Provider  acetaminophen (TYLENOL ARTHRITIS PAIN) 650 MG CR tablet Take 1,300 mg by mouth 2 (two) times a day.     [provider]  amLODipine (NORVASC) 5 MG tablet Take 5 mg by mouth daily. 12/07/18   [provider]  aspirin EC 81 MG tablet Take 81 mg by mouth daily.     [provider]  calcium-vitamin D (CALCIUM 500+D HIGH POTENCY) 500-400 MG-UNIT tablet Take 1 tablet by mouth daily.     [provider]  clopidogrel (PLAVIX) 75 MG tablet TAKE 1 TABLET BY MOUTH  DAILY Patient taking differently: Take 75 mg by mouth daily.  07/04/18   Nahser, Deloris Ping, MD  Cyanocobalamin (VITAMIN B-12) 2500 MCG SUBL Place 2,500 mcg under the tongue daily.    [provider]  Ensure (ENSURE) Take 237 mLs by mouth 2 (two)  times daily as needed (for supplementation).    [provider]  gabapentin (NEURONTIN) 400 MG capsule TAKE 1 CAPSULE (400 MG TOTAL) BY MOUTH 4 (FOUR) TIMES DAILY. 07/10/18   Butch Penny, NP  ipratropium-albuterol (DUONEB) 0.5-2.5 (3) MG/3ML SOLN Take 3 mLs by nebulization. INHALE 1 VIAL VIA NEBULIZER EVERY 6 HOURS AS NEEDED FOR WHEEZING    [provider]  isosorbide mononitrate (IMDUR) 30 MG 24 hr tablet Take 0.5 tablets (15 mg total) by mouth daily. 12/13/18   Burnadette Pop, MD  levothyroxine (SYNTHROID, LEVOTHROID) 100 MCG tablet Take 100 mcg by mouth daily before breakfast.    [provider]  magnesium hydroxide (MILK OF MAGNESIA) 400 MG/5ML suspension Constipation (1 of 4): If no BM in 3 days, give 30 cc Milk of Magnesium p.o. x 1 dose in 24 hours as needed (Do not use standing constipation orders for residents with renal failure CFR less than 30. Contact MD for orders)    [provider]  metoprolol tartrate (LOPRESSOR) 25 MG tablet Take 1 tablet (25 mg total) by mouth 2 (two) times  daily. 12/12/18   Burnadette Pop, MD  mirabegron ER (MYRBETRIQ) 50 MG TB24 tablet Take 50 mg by mouth daily.    [provider]  Multiple Vitamin (MULTIVITAMIN WITH MINERALS) TABS tablet Take 1 tablet by mouth daily.    [provider]  NITROSTAT 0.4 MG SL tablet DISSOLVE 1 TAB UNDER THE  TONGUE EVERY 5 MINUTES AS  NEEDED FOR CHEST PAIN. MAX  3 TOTAL DOSES/15MIN. CALL  911 IF NO RELIEF Patient taking differently: Place 0.4 mg under the tongue every 5 (five) minutes x 3 doses as needed for chest pain (AND CALL 9-1-1-, IF NO RELIEF).  09/11/18   Nahser, Deloris Ping, MD  NON FORMULARY Mighty Shake (chocolate if available) Drink 1 shake by mouth two times a day    [provider]  pantoprazole (PROTONIX) 20 MG tablet Take 20 mg by mouth daily before breakfast. DO NOT CRUSH    [provider]  simvastatin (ZOCOR) 20 MG tablet Take 20 mg by mouth at bedtime.     [provider]     Objective    Physical Exam: Vitals:   11/26/22 2247 11/26/22 2247 11/26/22 2250  BP: 122/64    Pulse: 90    Resp: 16    Temp:  98.4 F (36.9 C)   SpO2: 96%    Weight:   60.3 kg  Height:   5\' 2"  (1.575 m)    General: appears to be stated age; alert, oriented Skin: warm, dry, no rash Head:  AT/[place]:  Oral mucosa membranes appear moist, normal dentition Neck: supple; trachea midline Heart:  RRR; did not appreciate any M/R/G Lungs: CTAB, did not appreciate any wheezes, rales, or rhonchi Abdomen: + BS; soft, ND, NT Vascular: 2+ pedal pulses b/l; 2+ radial pulses b/l Extremities: no peripheral edema, no muscle wasting Neuro: strength and sensation intact in upper and lower extremities b/l; no evidence of dysarthria, facial droop.  Normal muscle tone.  No tremors.    Labs on Admission: I have personally reviewed following labs and imaging studies  CBC: Recent Labs  Lab 11/26/22 2245 11/26/22 2258  WBC 7.7  --   NEUTROABS 5.8  --   HGB 13.4 13.9  HCT 40.8  41.0  MCV 87.0  --   PLT 124*  --    Basic Metabolic Panel: Recent Labs  Lab 11/26/22 2258  NA 135  K 3.8  CL 99  GLUCOSE 117*  BUN 11  CREATININE 0.70   GFR: Estimated Creatinine Clearance: 36.8 mL/min (by C-G formula based on SCr of 0.7 mg/dL). Liver Function Tests: No results for input(s): "AST", "ALT", "ALKPHOS", "BILITOT", "PROT", "ALBUMIN" in the last 168 hours. No results for input(s): "LIPASE", "AMYLASE" in the last 168 hours. No results for input(s): "AMMONIA" in the last 168 hours. Coagulation Profile: Recent Labs  Lab 11/26/22 2245  INR 1.0   Cardiac Enzymes: No results for input(s): "CKTOTAL", "CKMB", "CKMBINDEX", "TROPONINI" in the last 168 hours. BNP (last 3 results) No results for input(s): "PROBNP" in the last 8760 hours. HbA1C: No results for input(s): "HGBA1C" in the last 72 hours. CBG: No results for input(s): "GLUCAP" in the last 168 hours. Lipid Profile: No results for input(s): "CHOL", "HDL", "LDLCALC", "TRIG", "CHOLHDL", "LDLDIRECT" in the last 72 hours. Thyroid Function Tests: No results for input(s): "TSH", "T4TOTAL", "FREET4", "T3FREE", "THYROIDAB" in the last 72 hours. Anemia Panel: No results for input(s): "VITAMINB12", "FOLATE", "FERRITIN", "TIBC", "IRON", "RETICCTPCT" in the last 72 hours. Urine analysis:    Component Value Date/Time   COLORURINE YELLOW 08/01/2018 1438   APPEARANCEUR CLOUDY (A) 08/01/2018 1438   LABSPEC 1.013 08/01/2018 1438   PHURINE 8.0 08/01/2018 1438   GLUCOSEU NEGATIVE 08/01/2018 1438   HGBUR NEGATIVE 08/01/2018 1438   BILIRUBINUR NEGATIVE 08/01/2018 1438   KETONESUR NEGATIVE 08/01/2018 1438   PROTEINUR NEGATIVE 08/01/2018 1438   NITRITE NEGATIVE 08/01/2018 1438   LEUKOCYTESUR TRACE (A) 08/01/2018 1438    Radiological Exams on Admission: CT HEAD WO CONTRAST ( )  Result Date: 11/26/2022 CLINICAL DATA:  Fall EXAM: CT HEAD WITHOUT CONTRAST TECHNIQUE: Contiguous axial images were obtained from the base of  the skull through the vertex without intravenous contrast. RADIATION DOSE REDUCTION: This exam was performed according to the departmental dose-optimization program which includes automated exposure control, adjustment of the mA and/or kV according to patient size and/or use of iterative reconstruction technique. COMPARISON:  04/27/2017 FINDINGS: Brain: Possible trace subarachnoid hemorrhage in the right frontal lobe (series 3, image 23). No evidence of parenchymal or subdural hemorrhage. No evidence of acute infarct, mass, mass effect, or midline shift. No hydrocephalus. Vascular: No hyperdense vessel. Atherosclerotic calcifications in the intracranial carotid and vertebral arteries. Skull: Normal. Negative for fracture or focal lesion. Sinuses/Orbits: No acute finding. Other: The mastoid air cells are well aerated. IMPRESSION: Possible trace subarachnoid hemorrhage in the right frontal lobe. No mass effect. These results were called by telephone at the time of interpretation on 11/26/2022 at 11:48 pm to provider [facility], who verbally acknowledged these results. Electronically Signed   By: Wiliam Ke M.D.   On: 11/26/2022 23:48   CT Cervical Spine Wo Contrast  Result Date: 11/26/2022 CLINICAL DATA:  Neck trauma (Age >= 65y) Fall in the shower. EXAM: CT CERVICAL SPINE WITHOUT CONTRAST TECHNIQUE: Multidetector CT imaging of the cervical spine was performed without intravenous contrast. Multiplanar CT image reconstructions were also generated. RADIATION DOSE REDUCTION: This exam was performed according to the departmental dose-optimization program which includes automated exposure control, adjustment of the mA and/or kV according to patient size and/or use of iterative reconstruction technique. COMPARISON:  CT cervical spine 08/28/2018 FINDINGS: Alignment: Normal. Skull base and vertebrae: Anterior fusion C5 through C7 with interbody spacers. Intact hardware. No acute fracture. The dens and skull base are  intact. Moderate C1-C2 degenerative change. Soft tissues and spinal canal: No prevertebral fluid or swelling. No visible canal hematoma. Disc levels: C3-C4 and  C4-C5 disc space narrowing and spurring. Posterior spurring causes mass effect on the spinal canal, similar to prior exam. There is also posterior spurring at C6-C7 causing spinal canal narrowing. Moderate multilevel facet hypertrophy. Upper chest: No acute or unexpected findings. Other: Carotid calcifications. IMPRESSION: 1. No acute fracture or subluxation of the cervical spine. 2. Degenerative and postsurgical change. Electronically Signed   By: Narda Rutherford M.D.   On: 11/26/2022 23:22   DG Chest Port 1 View  Result Date: 11/26/2022 CLINICAL DATA:  Fall. EXAM: PORTABLE CHEST 1 VIEW COMPARISON:  12/10/2018 FINDINGS: Shallow inspiration. Heart size and pulmonary vascularity are normal. Blunting of the left costophrenic angles suggesting a small left pleural effusion. Lungs are clear. Calcification of the aorta. Degenerative changes in the spine. Postoperative changes in the cervical spine. IMPRESSION: Small left pleural effusion.  No focal consolidation. Electronically Signed   By: Burman Nieves M.D.   On: 11/26/2022 23:10   DG Pelvis Portable  Result Date: 11/26/2022 CLINICAL DATA:  Fall yesterday.  Left hip pain. EXAM: PORTABLE PELVIS 1-2 VIEWS COMPARISON:  None Available. FINDINGS: Degenerative changes in the spine and hips. No evidence of acute fracture or dislocation. No focal bone lesion or bone destruction. SI joints and symphysis pubis are not displaced. Soft tissues are unremarkable. IMPRESSION: Degenerative changes in the hips.  No acute bony abnormalities. Electronically Signed   By: Burman Nieves M.D.   On: 11/26/2022 23:09      Assessment/Plan   Principal Problem:   Subarachnoid bleed (HCC) Active Problems:   UTI (urinary tract infection)   HLD (hyperlipidemia)   Acquired hypothyroidism   Essential hypertension    Fall at home, initial encounter   Urinary incontinence   GERD (gastroesophageal reflux disease)     #) Subarachnoid hemorrhage: In the setting of, mechanical fall in which the patient struck her head, presenting noncontrast CT head shows possible trace subarachnoid hemorrhage in the right frontal lobe, without any evidence of associated mass effect or any evidence of cerebral edema.  Not associate with any overt acute focal neurologic deficits.  She is reported to be on dual antiplatelet therapy with daily baby aspirin as well as Plavix.  Per my brief chart review, there is documentation from June 2020 with the patient being on Plavix, although the indication for such as not entirely clear to me at this time.  Regardless, EDP confirmed that the patient is actively receiving with aspirin and Plavix at her SNF facility.   EDP discussed case/imaging with with on-call neurosurgery, Dr. Franky Macho, who recommended repeat CT head in 6 hours, but otherwise no indication for neurosurgical intervention.   No evidence of worsening hypertension or bradycardia to suggest increasing intracranial pressure at this time.   Plan: Neurosurgery consulted, as above.  Repeat CT scan in 6 hours has been ordered, per neurosurgery recommendations.  Monitor on telemetry.  Serial neurochecks ordered.  Prn IV hydralazine for systolic blood pressure greater than 160.  Repeat CBC in the morning.  Hold next doses of daily baby aspirin and Plavix.  Will attempt additional chart review to ascertain the specific indications for these antiplatelet medications.  Fall precautions.             #) Ground-level mechanical fall: As a result of patient's report that she tripped/lost her balance while attempting to ambulate at facility, in the absence of any associated loss of consciousness.  Will this appears to be mechanical in nature, and evaluating for potential underlying predisposing factors, it appears  that her urinalysis is  associated with significant pyuria.  While she denies any specific urinary complaints at this time, she is at risk for urinary tract infection in the context of pharmacologically induced relative urinary retention as a consequence of outpatient Myrbetriq, and in the setting of her presenting, mechanical fall will treat for uncomplicated acute cystitis, as further detailed below.  No evidence of additional underlying contributory infectious process, including presenting chest x-ray which shows no evidence of infiltrate.  Plan: Fall precautions ordered.  PT/OT consults ordered for the morning.  Further evaluation management of suspected acute cystitis, as further detailed below.              #) Acute cystitis: As described above, will consider her significant pyuria along with increased risks factors for urinary tract infection in tandem with presenting, mechanical fall, to be clinically suspicious for uncomplicated urinary tract infection which may have predisposed her to her presenting ground-level mechanical fall.  Consequently, will initiate 3 to 5-day course of antibiotics for uncomplicated urinary tract infection.  Of note, in the absence of objective fever or leukocytosis, SIRS criteria are not met for sepsis at this time.  In reviewing antibiotic options, it appears that she has an allergy to penicillin, but further chart review reveals that she received Rocephin without complication in February 2020.  Consequently, we will proceed with Rocephin initially for treatment of her underlying urinary tract infection.  Plan: Rocephin, as above.  Add on urine culture.  Repeat CBC in the morning.                #) Hyperlipidemia: documented h/o such. On simvastatin as outpatient.   Plan: continue home statin.                #) Essential Hypertension: documented h/o such, with outpatient antihypertensive regimen including metoprolol tartrate, amlodipine, Imdur.  SBP's  in the ED today: 120s to 150s mmHg. will begin to resume home antihypertensive medications, will noting goal systolic blood pressure of less than 160 in the context of suspected subarachnoid hemorrhage, as above.  Plan: Close monitoring of subsequent BP via routine VS. resume home metoprolol tartrate, amlodipine.  Will hold home Imdur for now, will closely monitor Rensing blood pressure via serial routine BP checks.  As needed IV hydralazine for systolic blood pressure greater than 160.                #) acquired hypothyroidism: documented h/o such, which appears to be as a consequence of preceding thyroid ablation via radioactive iodine in the context of hyperthyroidism.  On Synthroid as outpatient.   Plan: cont home Synthroid.                 #) Urinary incontinence: On Myrbetriq as an outpatient, serving as a potential predisposing factor leading to urinary tract infection by induction of relative urinary retention.   Plan: Will hold next dose of Myrbetriq.  Monitor strict I's and O's and daily weights.  CMP in the morning.               #) GERD: documented h/o such; on Protonix as outpatient.   Plan: continue home PPI.      DVT prophylaxis: SCD's   Code Status: DNR/DNI, consistent with code status documentation from most recent prior hospitalization.  Family Communication: case d/w son, as above.  Disposition Plan: Per Rounding Team Consults called: EDP d/w case/imaging w/ on-call neurosurgery, Dr. Franky Macho, as further detailed above;  Admission status:  Observation     I SPENT GREATER THAN 75  MINUTES IN CLINICAL CARE TIME/MEDICAL DECISION-MAKING IN COMPLETING THIS ADMISSION.      Chaney Born Clenton Esper DO [facility]  From 7PM - 7AM   11/27/2022, 1:37 AM

## 2022-11-27 NOTE — ED Notes (Signed)
Family updated as to patient's status.

## 2022-11-27 NOTE — Progress Notes (Signed)
Orthopedic Tech Progress Note Patient Details:  Kimberly Bass [birth year] [id number]  Patient ID: Kimberly Bass, female   DOB: 10/13/1925, 89 y.o.   MRN: [id number] Level 2 trauma not needed.  Kimiye Strathman L Janae Bonser 11/27/2022, 12:13 AM

## 2022-11-27 NOTE — Progress Notes (Signed)
[facility] PLAN OF CARE NOTE Patient: Kimberly Bass WUJ:[id number]   PCP: Merri Brunette, MD DOB: 11/14/1925   DOA: 11/26/2022   DOS: 11/27/2022    Patient was admitted by my colleague earlier on 11/27/2022. I have reviewed the H&P as well as assessment and plan and agree with the same. Important changes in the plan are listed below.  Plan of care: Principal Problem:   Subarachnoid bleed (HCC) Active Problems:   UTI (urinary tract infection)   HLD (hyperlipidemia)   Acquired hypothyroidism   Essential hypertension   Fall at home, initial encounter   Urinary incontinence   GERD (gastroesophageal reflux disease)   Subarachnoid hemorrhage following injury, no loss of consciousness (HCC) Repeat CT head negative for any SAH.  Hold aspirin and plavix for now but will likely resume tomorrow.  Appears to be shortness of breath and has leg edema. Will check Echocardiogram and give lasix.  Checked x ray ribs and x ray spine which is negative for any fracture.  Meds added for pain control.   Level of care: Telemetry Cardiac  Author: Lynden Oxford, MD [facility] 11/27/2022 5:06 PM   If 7PM-7AM, please contact night-coverage at [email or web address]

## 2022-11-27 NOTE — ED Notes (Signed)
Paged Neurosurgey-Cabbell to [facility] requested by PA Humes-Paged By Sue Lush

## 2022-11-27 NOTE — Progress Notes (Signed)
PT Cancellation Note  Patient Details Name: Kimberly Bass MRN: [id number] DOB: 10/14/1925   Cancelled Treatment:    Reason Eval/Treat Not Completed: Other (comment).  Son is asking to wait to stand the patient, will reattempt at another time.   Ivar Drape 11/27/2022, 10:22 AM  Samul Dada, PT PhD Acute Rehab Dept. Number: [facility] [id number] and [facility] [phone]

## 2022-11-28 ENCOUNTER — Observation Stay (HOSPITAL_COMMUNITY): Payer: Medicare Other

## 2022-11-28 DIAGNOSIS — Z7901 Long term (current) use of anticoagulants: Secondary | ICD-10-CM | POA: Diagnosis not present

## 2022-11-28 DIAGNOSIS — I4891 Unspecified atrial fibrillation: Secondary | ICD-10-CM | POA: Diagnosis present

## 2022-11-28 DIAGNOSIS — Z79890 Hormone replacement therapy (postmenopausal): Secondary | ICD-10-CM | POA: Diagnosis not present

## 2022-11-28 DIAGNOSIS — Z79899 Other long term (current) drug therapy: Secondary | ICD-10-CM | POA: Diagnosis not present

## 2022-11-28 DIAGNOSIS — Z8673 Personal history of transient ischemic attack (TIA), and cerebral infarction without residual deficits: Secondary | ICD-10-CM | POA: Diagnosis not present

## 2022-11-28 DIAGNOSIS — K219 Gastro-esophageal reflux disease without esophagitis: Secondary | ICD-10-CM | POA: Diagnosis present

## 2022-11-28 DIAGNOSIS — E669 Obesity, unspecified: Secondary | ICD-10-CM | POA: Diagnosis present

## 2022-11-28 DIAGNOSIS — Z7982 Long term (current) use of aspirin: Secondary | ICD-10-CM | POA: Diagnosis not present

## 2022-11-28 DIAGNOSIS — W01198A Fall on same level from slipping, tripping and stumbling with subsequent striking against other object, initial encounter: Secondary | ICD-10-CM | POA: Diagnosis present

## 2022-11-28 DIAGNOSIS — Z888 Allergy status to other drugs, medicaments and biological substances status: Secondary | ICD-10-CM | POA: Diagnosis not present

## 2022-11-28 DIAGNOSIS — S066X0A Traumatic subarachnoid hemorrhage without loss of consciousness, initial encounter: Secondary | ICD-10-CM | POA: Diagnosis present

## 2022-11-28 DIAGNOSIS — I609 Nontraumatic subarachnoid hemorrhage, unspecified: Secondary | ICD-10-CM

## 2022-11-28 DIAGNOSIS — Z923 Personal history of irradiation: Secondary | ICD-10-CM | POA: Diagnosis not present

## 2022-11-28 DIAGNOSIS — Y92099 Unspecified place in other non-institutional residence as the place of occurrence of the external cause: Secondary | ICD-10-CM | POA: Diagnosis not present

## 2022-11-28 DIAGNOSIS — I10 Essential (primary) hypertension: Secondary | ICD-10-CM | POA: Diagnosis present

## 2022-11-28 DIAGNOSIS — Z66 Do not resuscitate: Secondary | ICD-10-CM | POA: Diagnosis present

## 2022-11-28 DIAGNOSIS — I252 Old myocardial infarction: Secondary | ICD-10-CM | POA: Diagnosis not present

## 2022-11-28 DIAGNOSIS — E785 Hyperlipidemia, unspecified: Secondary | ICD-10-CM | POA: Diagnosis present

## 2022-11-28 DIAGNOSIS — Z8249 Family history of ischemic heart disease and other diseases of the circulatory system: Secondary | ICD-10-CM | POA: Diagnosis not present

## 2022-11-28 DIAGNOSIS — Z6824 Body mass index (BMI) 24.0-24.9, adult: Secondary | ICD-10-CM | POA: Diagnosis not present

## 2022-11-28 DIAGNOSIS — Z880 Allergy status to penicillin: Secondary | ICD-10-CM | POA: Diagnosis not present

## 2022-11-28 DIAGNOSIS — R531 Weakness: Secondary | ICD-10-CM | POA: Diagnosis present

## 2022-11-28 DIAGNOSIS — Z885 Allergy status to narcotic agent status: Secondary | ICD-10-CM | POA: Diagnosis not present

## 2022-11-28 DIAGNOSIS — E039 Hypothyroidism, unspecified: Secondary | ICD-10-CM | POA: Diagnosis present

## 2022-11-28 DIAGNOSIS — Z7902 Long term (current) use of antithrombotics/antiplatelets: Secondary | ICD-10-CM | POA: Diagnosis not present

## 2022-11-28 DIAGNOSIS — J90 Pleural effusion, not elsewhere classified: Secondary | ICD-10-CM | POA: Diagnosis present

## 2022-11-28 DIAGNOSIS — N3000 Acute cystitis without hematuria: Secondary | ICD-10-CM | POA: Diagnosis present

## 2022-11-28 DIAGNOSIS — I2510 Atherosclerotic heart disease of native coronary artery without angina pectoris: Secondary | ICD-10-CM | POA: Diagnosis present

## 2022-11-28 LAB — BASIC METABOLIC PANEL
Anion gap: 14 (ref 5–15)
BUN: 9 mg/dL (ref 8–23)
CO2: 25 mmol/L (ref 22–32)
Calcium: 8.8 mg/dL — ABNORMAL LOW (ref 8.9–10.3)
Chloride: 96 mmol/L — ABNORMAL LOW (ref 98–111)
Creatinine, Ser: 0.96 mg/dL (ref 0.44–1.00)
GFR, Estimated: 55 mL/min — ABNORMAL LOW (ref >60)
Glucose, Bld: 111 mg/dL — ABNORMAL HIGH (ref 70–99)
Potassium: 3.7 mmol/L (ref 3.5–5.1)
Sodium: 135 mmol/L (ref 135–145)

## 2022-11-28 LAB — MAGNESIUM: Magnesium: 1.7 mg/dL (ref 1.7–2.4)

## 2022-11-28 NOTE — Evaluation (Signed)
Clinical/Bedside Swallow Evaluation Patient Details  Name: Kimberly Bass MRN: [id number] Date of Birth: [birth year]  Today's Date: 11/28/2022 Time: SLP Start Time (ACUTE ONLY): 1320 SLP Stop Time (ACUTE ONLY): 1342 SLP Time Calculation (min) (ACUTE ONLY): 22 min  Past Medical History:  Past Medical History:  Diagnosis Date   Arthritis    "mild; in all my joints" (04/27/2017)   Coronary artery disease    Cystitis with hematuria 04/26/2017   Gait disorder    H/O radioactive iodine thyroid ablation    Hx of transient ischemic attack (TIA)    Hyperlipidemia    Hypertension    "not since MI in 2007" (04/27/2017)   Hyperthyroidism    H/O radioactive iodine thyroid ablation [Z92.3]   Hypothyroidism    Lower extremity myoclonus    Lumbosacral spondylosis    Metabolic encephalopathy    Migraine    "I've had one in my lifetime" (04/27/2017)   Mild obesity    Myocardial infarction (HCC) 12/2005   Myoclonus    Lower extremity involvement   Obesity    Pneumonia    "lots when I was a small child; not at all since" (04/27/2017)   Rhabdomyolysis    Seizures (HCC)    History is questionable   Stroke [facility])    Thyroid disease    Past Surgical History:  Past Surgical History:  Procedure Laterality Date   ABDOMINAL HYSTERECTOMY  1972   for uterine fibroids/notes 11/10/2010   ANTERIOR CERVICAL DECOMP/DISCECTOMY FUSION  03/2004   [name] 11/10/2010   APPENDECTOMY     BACK SURGERY     BREAST BIOPSY Right 1996   nodule resected/notes 11/10/2010   CARDIAC CATHETERIZATION  01/03/2006   CATARACT EXTRACTION W/ INTRAOCULAR LENS  IMPLANT, BILATERAL Bilateral    COLONOSCOPY  11/2003   [name] 11/10/2010   ESOPHAGOGASTRODUODENOSCOPY  09/2006   [name] 11/10/2010   LUMBAR DISC SURGERY  1990s X 1; 05/2003   "a nerve was pinching"; /notes 11/10/2010   OVARY SURGERY Left 1999   benign tumor resected /notes 11/10/2010   TONSILLECTOMY  ~ 1947   HPI:  Kimberly Bass is a 89 y.o. female who  presented to [facility] ED from SNF following ground level fall with potential small subarachnoid hemorrhage. Head CT 5/31: "Possible trace subarachnoid hemorrhage in the right frontal lobe. No mass effect." Head CT 6/1: "No convincing intracranial hemorrhage on this exam." Head CT 6/2 also without acute findings.  Pt with medical history significant for essential pretension, hyperlipidemia, acquired hypothyroidism.    Assessment / Plan / Recommendation  Clinical Impression  Pt presents with grossly normal swallow function as assessed clinically.  Pt tolerated all consistencies trialed with no clinical s/s of aspiration. She exhibited good oral clearance despite partial edentulism. She is upset about the state of her dentition.  She chews on L to compensate.  Offered mechanical soft diet if pt would prefer, but she is content continuing regular diet at appears safe to do so. Pt has no further ST needs at this time. SLP will sign off.    Recommend regular texture diet with thin liquids.  SLP Visit Diagnosis: Dysphagia, unspecified (R13.10)    Aspiration Risk  No limitations    Diet Recommendation Regular;Thin liquid   Liquid Administration via: Cup;Straw Medication Administration: Whole meds with liquid Supervision: Patient able to self feed Compensations: Slow rate;Small sips/bites Postural Changes: Seated upright at 90 degrees    Other  Recommendations Oral Care Recommendations: Oral care BID    Recommendations for  follow up therapy are one component of a multi-disciplinary discharge planning process, led by the attending physician.  Recommendations may be updated based on patient status, additional functional criteria and insurance authorization.  Follow up Recommendations No SLP follow up      Assistance Recommended at Discharge  N/A  Functional Status Assessment Patient has not had a recent decline in their functional status  Frequency and Duration  (N/A)          Prognosis Prognosis  for improved oropharyngeal function:  (N/A)      Swallow Study   General Date of Onset: 11/26/22 HPI: Kimberly Bass is a 89 y.o. female who presented to [facility] ED from SNF following ground level fall with potential small subarachnoid hemorrhage. Head CT 5/31: "Possible trace subarachnoid hemorrhage in the right frontal lobe. No mass effect." Head CT 6/1: "No convincing intracranial hemorrhage on this exam." Head CT 6/2 also without acute findings.  Pt with medical history significant for essential pretension, hyperlipidemia, acquired hypothyroidism. Type of Study: Bedside Swallow Evaluation Previous Swallow Assessment: None Diet Prior to this Study: Regular;Thin liquids (Level 0) Temperature Spikes Noted: No History of Recent Intubation: No Behavior/Cognition: Alert;Cooperative;Pleasant mood Oral Cavity Assessment: Within Functional Limits Oral Care Completed by SLP: No Oral Cavity - Dentition: Poor condition;Missing dentition Vision: Functional for self-feeding Self-Feeding Abilities: Able to feed self Patient Positioning: Upright in bed Baseline Vocal Quality: Normal Volitional Cough: Strong Volitional Swallow: Able to elicit    Oral/Motor/Sensory Function Overall Oral Motor/Sensory Function: Mild impairment Facial ROM: Within Functional Limits Facial Symmetry: Within Functional Limits Lingual ROM: Within Functional Limits Lingual Symmetry: Abnormal symmetry left Lingual Strength: Reduced Velum: Within Functional Limits Mandible: Within Functional Limits   Ice Chips Ice chips: Not tested   Thin Liquid Thin Liquid: Within functional limits Presentation: Straw    Nectar Thick Nectar Thick Liquid: Not tested   Honey Thick Honey Thick Liquid: Not tested   Puree Puree: Within functional limits Presentation: Spoon   Solid     Solid: Within functional limits Presentation: Self Fed      Kerrie Pleasure, MA, CCC-SLP Acute Rehabilitation Services Office: [phone] 11/28/2022,2:49  PM

## 2022-11-28 NOTE — Plan of Care (Signed)

## 2022-11-28 NOTE — TOC CAGE-AID Note (Signed)
Transition of Care [facility]) - CAGE-AID Screening   Patient Details  Name: Kimberly Bass MRN: [id number] Date of Birth: 1925/07/28  Transition of Care [facility]) CM/SW Contact:    Janora Norlander, RN Phone Number: [phone] 11/28/2022, 2:12 PM   Clinical Narrative: Pt denies alcohol or drug use.  Screening complete.   CAGE-AID Screening:    Have You Ever Felt You Ought to Cut Down on Your Drinking or Drug Use?: No Have People Annoyed You By Critizing Your Drinking Or Drug Use?: No Have You Felt Bad Or Guilty About Your Drinking Or Drug Use?: No Have You Ever Had a Drink or Used Drugs First Thing In The Morning to Steady Your Nerves or to Get Rid of a Hangover?: No CAGE-AID Score: 0  Substance Abuse Education Offered: No

## 2022-11-28 NOTE — TOC Initial Note (Signed)
Transition of Care [facility]) - Initial/Assessment Note    Patient Details  Name: Kimberly Bass MRN: [id number] Date of Birth: [birth year]  Transition [facility]) CM/SW Contact:    Carmina Miller, LCSWA Phone Number: 11/28/2022, 10:48 AM  Clinical Narrative:                  CSW received consult to speak with pt's son. CSW spoke with pt's son [name], he states pt is currently residing at [facility] ALF but he feels she needs a higher level of care as she has had three falls in the last eight days. [name] states pt receives SSI which goes towards her bills and he also contributes, shas no other assets. CSW discussed LTC Medicaid with [name], discussed we will have to wait to see what recommendation PT makes for pt and go from there, explained if pt goes to SNF it would be for STR, then possible to transition into a LTC bed with the approval of LTC Medicaid. At this time, PT hasn't assessed pt.      Patient Goals and CMS Choice            Expected Discharge Plan and Services                                              Prior Living Arrangements/Services                       Activities of Daily Living Home Assistive Devices/Equipment: Dan Humphreys (specify type) ADL Screening (condition at time of admission) Is the patient deaf or have difficulty hearing?: No Does the patient have difficulty seeing, even when wearing glasses/contacts?: No Does the patient have difficulty concentrating, remembering, or making decisions?: No Does the patient have difficulty dressing or bathing?: No Does the patient have difficulty walking or climbing stairs?: No  Permission Sought/Granted                  Emotional Assessment              Admission diagnosis:  Pleural effusion [J90] Subarachnoid bleed (HCC) [I60.9] Subarachnoid hemorrhage following injury, no loss of consciousness, initial encounter (HCC) [S06.6X0A] Patient Active Problem List   Diagnosis  Date Noted   Subarachnoid bleed (HCC) 11/27/2022   Fall at home, initial encounter 11/27/2022   Urinary incontinence 11/27/2022   GERD (gastroesophageal reflux disease) 11/27/2022   Subarachnoid hemorrhage following injury, no loss of consciousness (HCC) 11/27/2022   Chest pain, rule out acute myocardial infarction 12/10/2018   Cutaneous vasculitis 09/19/2018   Asthmatic bronchitis 09/04/2018   Syncope 09/01/2018   PAF (paroxysmal atrial fibrillation) (HCC) 09/01/2018   Traumatic hematoma of knee, left, initial encounter 09/01/2018   RLS (restless legs syndrome) 09/01/2018   Bradycardia 08/30/2018   Elevated troponin 08/30/2018   Rhabdomyolysis 08/29/2018   Hypomagnesemia    Pyuria 08/01/2018   AMS (altered mental status) 08/01/2018   Altered mental status 08/01/2018   Acquired hypothyroidism 12/12/2017   Acute encephalopathy 12/12/2017   Essential hypertension 12/12/2017   Hypokalemia 12/12/2017   HLD (hyperlipidemia) 05/05/2017   UTI (urinary tract infection) 04/27/2017   Tremor, essential 11/03/2015   Myoclonus 04/16/2015   Abnormality of gait 08/15/2012   Essential and other specified forms of tremor 08/15/2012   Degeneration of lumbar or lumbosacral intervertebral disc 08/15/2012  Hx of low back pain 08/15/2012   CAD (coronary artery disease) 12/23/2010   Hx of transient ischemic attack (TIA)    CONSTIPATION 02/27/2009   PERSONAL HX COLONIC POLYPS 02/27/2009   PCP:  Merri Brunette, MD Pharmacy:   [facility] - [place], [place] - [phone] [address] [place] [address] Phone: [phone] Fax: [phone]     Social Determinants of Health (SDOH) Social History: SDOH Screenings   Utilities: Not At Risk (11/27/2022)  Tobacco Use: Low Risk  (11/27/2022)   SDOH Interventions:     Readmission Risk Interventions     No data to display

## 2022-11-28 NOTE — Evaluation (Signed)
Occupational Therapy Evaluation Patient Details Name: Kimberly Bass MRN: [id number] DOB: [birth year] Today's Date: 11/28/2022   History of Present Illness Kimberly Bass is a 89 y.o. female who presents from [place] [facility]F after ground-level mechanical fall. CT head showed possible trace subarachnoid hemorrhage in the right frontal lobe. PMHx: essential pretension, hyperlipidemia, acquired hypothyroidism.   Clinical Impression   Pt evaluated s/p above admission list. Pt was receiving assistance with ADLs at [facility], completes functional mobility with rollator and has a significant fall history at baseline. Son present during session and reports concerns with pt returning to ALF as she has been experiencing a decline in function. During session, pt limited secondary to generalized weakness, pain, decreased balance, and cognition with pt requiring simple 1 step commands for redirection throughout. Pt currently requires min A for seated ADLs and mod A for LB ADLs. Pt completed STS transfer from EOB and step pivot transfer from bed>chair with min A and use of RW. Pt would benefit from continued acute OT services to maximize functional independence and facilitate transition to skilled inpatient follow up therapy, <3 hours/day per son's request.      Recommendations for follow up therapy are one component of a multi-disciplinary discharge planning process, led by the attending physician.  Recommendations may be updated based on patient status, additional functional criteria and insurance authorization.   Assistance Recommended at Discharge Frequent or constant Supervision/Assistance  Patient can return home with the following A little help with walking and/or transfers;A lot of help with bathing/dressing/bathroom;Assistance with cooking/housework;Direct supervision/assist for medications management;Direct supervision/assist for financial management;Assist for transportation;Help with stairs  or ramp for entrance    Functional Status Assessment  Patient has had a recent decline in their functional status and demonstrates the ability to make significant improvements in function in a reasonable and predictable amount of time.  Equipment Recommendations  Other (comment) (defer)    Recommendations for Other Services       Precautions / Restrictions Precautions Precautions: Fall Restrictions Weight Bearing Restrictions: No      Mobility Bed Mobility Overal bed mobility: Needs Assistance Bed Mobility: Supine to Sit     Supine to sit: Supervision, HOB elevated     General bed mobility comments: HOB elevated, use of bed rails, cues given for initiation    Transfers Overall transfer level: Needs assistance Equipment used: Rolling walker (2 wheels) Transfers: Sit to/from Stand, Bed to chair/wheelchair/BSC Sit to Stand: Min assist Stand pivot transfers: Min assist         General transfer comment: STS from EOB with min A and use of RW. Stand pivot from bed>chair using RW with min A and max verbal cues for sequencing task      Balance Overall balance assessment: Needs assistance, History of Falls Sitting-balance support: Feet supported, Single extremity supported Sitting balance-Leahy Scale: Poor Sitting balance - Comments: sitting EOB   Standing balance support: Bilateral upper extremity supported, Reliant on assistive device for balance Standing balance-Leahy Scale: Poor Standing balance comment: BUE support on RW for stability                           ADL either performed or assessed with clinical judgement   ADL Overall ADL's : Needs assistance/impaired Eating/Feeding: Set up;Sitting   Grooming: Minimal assistance;Sitting;Cueing for sequencing   Upper Body Bathing: Minimal assistance;Sitting;Cueing for sequencing   Lower Body Bathing: Sit to/from stand;Moderate assistance   Upper Body Dressing : Minimal assistance;Cueing  for  sequencing;Sitting   Lower Body Dressing: Sit to/from stand;Moderate assistance   Toilet Transfer: Minimal assistance;BSC/3in1;Rolling walker (2 wheels);Stand-pivot Statistician Details (indicate cue type and reason): simulated Toileting- Clothing Manipulation and Hygiene: Total assistance (incontinent)       Functional mobility during ADLs: Minimal assistance;Rolling walker (2 wheels) General ADL Comments: Assist levels given as pt requires cues for initiation and sequencing     Vision Baseline Vision/History: 1 Wears glasses Ability to See in Adequate Light: 0 Adequate Vision Assessment?: No apparent visual deficits     Perception Perception Perception Tested?: No   Praxis Praxis Praxis tested?: Not tested    Pertinent Vitals/Pain Pain Assessment Pain Assessment: Faces Faces Pain Scale: Hurts little more Pain Location: LLE Pain Descriptors / Indicators: Discomfort, Grimacing Pain Intervention(s): Limited activity within patient's tolerance, Monitored during session     Hand Dominance Left   Extremity/Trunk Assessment Upper Extremity Assessment Upper Extremity Assessment: Generalized weakness   Lower Extremity Assessment Lower Extremity Assessment: Defer to PT evaluation   Cervical / Trunk Assessment Cervical / Trunk Assessment: Kyphotic   Communication Communication Communication: No difficulties   Cognition Arousal/Alertness: Awake/alert Behavior During Therapy: WFL for tasks assessed/performed Overall Cognitive Status: History of cognitive impairments - at baseline                                 General Comments: Pt with dementia at baseline. Self distracting and requiring max verbal cues for redirection throughout session. Benefits from simple 1-step commands with increased time.     General Comments  VSS on RA, son present throughout session    Exercises     Shoulder Instructions      Home Living Family/patient expects to be  discharged to:: Assisted living                             Home Equipment: Rollator (4 wheels)   Additional Comments: Son requesting pt transition to SNF      Prior Functioning/Environment Prior Level of Function : Needs assist;History of Falls (last six months)             Mobility Comments: uses a rollator at baseline with significant fall history. Son reports pt is unable to ambulate to dining hall recently ADLs Comments: Pt recieves assist for bathing. Son reports pt has lately been wearing the same clothes multiple days in a row.        OT Problem List: Decreased strength;Decreased range of motion;Impaired balance (sitting and/or standing);Decreased activity tolerance;Decreased cognition;Decreased safety awareness;Decreased knowledge of use of DME or AE;Decreased knowledge of precautions;Pain      OT Treatment/Interventions: Self-care/ADL training;Energy conservation;DME and/or AE instruction;Therapeutic activities;Cognitive remediation/compensation;Patient/family education;Balance training    OT Goals(Current goals can be found in the care plan section) Acute Rehab OT Goals Patient Stated Goal: to eat OT Goal Formulation: With patient Time For Goal Achievement: 12/12/22 Potential to Achieve Goals: Good ADL Goals Pt Will Perform Upper Body Dressing: sitting;with set-up Pt Will Perform Lower Body Dressing: sit to/from stand;with supervision Pt Will Transfer to Toilet: stand pivot transfer;bedside commode;with supervision  OT Frequency: Min 2X/week    Co-evaluation              AM-PAC OT "6 Clicks" Daily Activity     Outcome Measure Help from another person eating meals?: A Little Help from another person taking care of personal grooming?:  A Little Help from another person toileting, which includes using toliet, bedpan, or urinal?: A Little Help from another person bathing (including washing, rinsing, drying)?: A Little Help from another person to  put on and taking off regular upper body clothing?: A Little Help from another person to put on and taking off regular lower body clothing?: A Lot 6 Click Score: 17   End of Session Equipment Utilized During Treatment: Gait belt;Rolling walker (2 wheels) Nurse Communication: Mobility status  Activity Tolerance: Patient tolerated treatment well Patient left: in chair;with call bell/phone within reach;with chair alarm set;with family/visitor present  OT Visit Diagnosis: Unsteadiness on feet (R26.81);Other abnormalities of gait and mobility (R26.89);Repeated falls (R29.6);Muscle weakness (generalized) (M62.81);Pain;Other (comment) (cognition)                Time: 1610-9604 OT Time Calculation (min): 23 min Charges:  OT General Charges $OT Visit: 1 Visit OT Evaluation $OT Eval Moderate Complexity: 1 Mod OT Treatments $Therapeutic Activity: 8-22 mins  Sherley Bounds, OTS Acute Rehabilitation Services Office [phone] Secure Chat Communication Preferred   Sherley Bounds 11/28/2022, 11:19 AM

## 2022-11-28 NOTE — Progress Notes (Signed)
PROGRESS NOTE                                                                                                                                                                                                             Patient Demographics:    Kimberly Bass, is a 89 y.o. female, DOB - Apr 26, 1926, UJW:[id number]  Outpatient Primary MD for the patient is Merri Brunette, MD    LOS - 0  Admit date - 11/26/2022    Chief Complaint  Patient presents with   Fall       Brief Narrative (HPI from H&P)   89 y.o. female with medical history significant for essential pretension, hyperlipidemia, acquired hypothyroidism, who is admitted to [facility] on 11/26/2022 with potential small subarachnoid hemorrhage after ground-level mechanical fall, after presenting from SNF to [facility] ED for evaluation of fall.    Subjective:    Kimberly Bass today has, No headache, No chest pain, No abdominal pain - No Nausea, No new weakness tingling or numbness, no SOB.   Assessment  & Plan :    Mechanical fall at assisted living with questionable subarachnoid hemorrhage on initial CT brain on 11/27/2022.  Repeat CT scan on 11/27/2022 about 10 hours later was stable, no headache or focal deficits, since on dual antiplatelet therapy will repeat CT scan on 11/28/2022 to make sure there is no bleed and then commence DAPT with caution.  Weakness, multiple falls at ALF.  Son now wants SNF placement.  PT OT and TOC consulted.  Acute cystitis.  On Rocephin.  Follow cultures, on Mybertriq.  Dyslipidemia.  On statin.  GERD . PPI  Hypothyroidism - on synthroid.  HTN - metoprolol tartrate, amlodipine, Imdur.       Condition - Extremely Guarded  Family Communication  :  son on 11/28/22  Code Status :  DNR  Consults  :  None  PUD Prophylaxis :     Procedures  :     CT - 1. Left ventricular ejection fraction, by estimation, is 70 to 75%. The left  ventricle has hyperdynamic function. The left ventricle has no regional wall motion abnormalities. Left ventricular diastolic parameters are indeterminate.  2. Right ventricular systolic function is normal. The right ventricular size is normal. There is normal pulmonary artery systolic pressure.  3. The mitral valve is normal  in structure. No evidence of mitral valve regurgitation. No evidence of mitral stenosis.  4. The aortic valve is normal in structure. There is mild calcification of the aortic valve. There is mild thickening of the aortic valve. Aortic valve regurgitation is not visualized. Aortic valve sclerosis is present, with no evidence of aortic valve stenosis.  5. The inferior vena cava is normal in size with greater than 50% respiratory variability, suggesting right atrial pressure of 3 mmHg.       Disposition Plan  :    Status is: Observation   DVT Prophylaxis  :    Place and maintain sequential compression device Start: 11/27/22 1317 Place TED hose Start: 11/27/22 1317 SCDs Start: 11/27/22 0132    Lab Results  Component Value Date   PLT 123 (L) 11/27/2022    Diet :  Diet Order             Diet regular Room service appropriate? Yes; Fluid consistency: Thin  Diet effective now                    Inpatient Medications  Scheduled Meds:  acetaminophen  1,000 mg Oral Q8H   amLODipine  5 mg Oral Daily   fesoterodine  4 mg Oral Daily   gabapentin  400 mg Oral QID   isosorbide mononitrate  15 mg Oral Daily   levothyroxine  88 mcg Oral QAC breakfast   metoprolol tartrate  25 mg Oral BID   pantoprazole  20 mg Oral QAC breakfast   triamcinolone cream  1 Application Topical BID   Continuous Infusions:  cefTRIAXone (ROCEPHIN)  IV 1 g (11/28/22 0411)   PRN Meds:.acetaminophen **OR** acetaminophen, hydrALAZINE, melatonin, methocarbamol, ondansetron (ZOFRAN) IV, oxyCODONE, polyethylene glycol, traMADol  Antibiotics  :    Anti-infectives (From admission, onward)     Start     Dose/Rate Route Frequency Ordered Stop   11/27/22 0315  cefTRIAXone (ROCEPHIN) 1 g in sodium chloride 0.9 % 100 mL IVPB       Note to Pharmacy: (It looks like patient received Rocephin in Feb '20, without any adverse rxn).   1 g 200 mL/hr over 30 Minutes Intravenous Every 24 hours 11/27/22 0312           Objective:   Vitals:   11/28/22 0200 11/28/22 0300 11/28/22 0400 11/28/22 0500  BP:   124/68   Pulse:   72   Resp: (!) 24 (!) 24 (!) 24 (!) 25  Temp:   98.3 F (36.8 C)   TempSrc:   Oral   SpO2:   94%   Weight:      Height:        Wt Readings from Last 3 Encounters:  11/26/22 60.3 kg  12/12/18 65.3 kg  10/17/18 67 kg     Intake/Output Summary (Last 24 hours) at 11/28/2022 0958 Last data filed at 11/27/2022 1107 Gross per 24 hour  Intake 41.74 ml  Output --  Net 41.74 ml     Physical Exam  Awake Alert, No new F.N deficits, Normal affect [name],PERRAL Supple Neck, No JVD,   Symmetrical Chest wall movement, Good air movement bilaterally, CTAB RRR,No Gallops,Rubs or new Murmurs,  +ve B.Sounds, Abd Soft, No tenderness,   No Cyanosis, Clubbing or edema        Data Review:    Recent Labs  Lab 11/26/22 2245 11/26/22 2258 11/27/22 0206  WBC 7.7  --  7.6  HGB 13.4 13.9 12.9  HCT 40.8 41.0  39.3  PLT 124*  --  123*  MCV 87.0  --  86.8  MCH 28.6  --  28.5  MCHC 32.8  --  32.8  RDW 14.4  --  14.3  LYMPHSABS 1.0  --  1.3  MONOABS 0.7  --  0.8  EOSABS 0.1  --  0.1  BASOSABS 0.0  --  0.0    Recent Labs  Lab 11/26/22 2245 11/26/22 2258 11/27/22 0206 11/28/22 0642  NA  --  135 134* 135  K  --  3.8 3.7 3.7  CL  --  99 102 96*  CO2  --   --  22 25  ANIONGAP  --   --  10 14  GLUCOSE  --  117* 111* 111*  BUN  --  11 8 9   CREATININE  --  0.70 0.71 0.96  AST  --   --  24  --   ALT  --   --  21  --   ALKPHOS  --   --  45  --   BILITOT  --   --  0.9  --   ALBUMIN  --   --  3.3*  --   INR 1.0  --   --   --   MG  --   --  1.5* 1.7  CALCIUM   --   --  8.8* 8.8*      Recent Labs  Lab 11/26/22 2245 11/27/22 0206 11/28/22 0642  INR 1.0  --   --   MG  --  1.5* 1.7  CALCIUM  --  8.8* 8.8*    Recent Labs  Lab 11/26/22 2245 11/26/22 2258 11/27/22 0206 11/28/22 0642  WBC 7.7  --  7.6  --   PLT 124*  --  123*  --   CREATININE  --  0.70 0.71 0.96    ------------------------------------------------------------------------------------------------------------------ No results found for: "CHOL", "HDL", "LDLCALC", "LDLDIRECT", "TRIG", "CHOLHDL"  No results found for: "HGBA1C"  No results for input(s): "TSH", "T4TOTAL", "T3FREE", "THYROIDAB" in the last 72 hours.  Invalid input(s): "FREET3" ------------------------------------------------------------------------------------------------------------------ Cardiac Enzymes No results for input(s): "CKMB", "TROPONINI", "MYOGLOBIN" in the last 168 hours.  Invalid input(s): "CK"  Micro Results No results found for this or any previous visit (from the past 240 hour(s)).  Radiology Reports ECHOCARDIOGRAM COMPLETE  Result Date: 11/27/2022    ECHOCARDIOGRAM REPORT   Patient Name:   SNEHA HASHIMOTO Date of Exam: 11/27/2022 Medical Rec #:  [id number]            Height:       62.0 in Accession #:    [id number]           Weight:       133.0 lb Date of Birth:  May 24, 1926             BSA:          1.607 m Patient Age:    89 years             BP:           161/90 mmHg Patient Gender: F                    HR:           101 bpm. Exam Location:  Inpatient Procedure: 2D Echo, Cardiac Doppler and Color Doppler Indications:    Congestive Heart Failure I50.9  History:        Patient has prior  history of Echocardiogram examinations, most                 recent 08/30/2018. CAD and Previous Myocardial Infarction, TIA and                 Stroke, Signs/Symptoms:Altered Mental Status and Shortness of                 Breath; Risk Factors:Hypertension and Dyslipidemia.  Sonographer:    Aron Baba Referring  Phys: [id number] [facility] M PATEL  Sonographer Comments: No subcostal window. IMPRESSIONS  1. Left ventricular ejection fraction, by estimation, is 70 to 75%. The left ventricle has hyperdynamic function. The left ventricle has no regional wall motion abnormalities. Left ventricular diastolic parameters are indeterminate.  2. Right ventricular systolic function is normal. The right ventricular size is normal. There is normal pulmonary artery systolic pressure.  3. The mitral valve is normal in structure. No evidence of mitral valve regurgitation. No evidence of mitral stenosis.  4. The aortic valve is normal in structure. There is mild calcification of the aortic valve. There is mild thickening of the aortic valve. Aortic valve regurgitation is not visualized. Aortic valve sclerosis is present, with no evidence of aortic valve stenosis.  5. The inferior vena cava is normal in size with greater than 50% respiratory variability, suggesting right atrial pressure of 3 mmHg. FINDINGS  Left Ventricle: Left ventricular ejection fraction, by estimation, is 70 to 75%. The left ventricle has hyperdynamic function. The left ventricle has no regional wall motion abnormalities. The left ventricular internal cavity size was normal in size. There is no left ventricular hypertrophy. Left ventricular diastolic parameters are indeterminate. Right Ventricle: The right ventricular size is normal. No increase in right ventricular wall thickness. Right ventricular systolic function is normal. There is normal pulmonary artery systolic pressure. The tricuspid regurgitant velocity is 0.84 m/s, and  with an assumed right atrial pressure of 8 mmHg, the estimated right ventricular systolic pressure is 10.8 mmHg. Left Atrium: Left atrial size was normal in size. Right Atrium: Right atrial size was normal in size. Pericardium: There is no evidence of pericardial effusion. Mitral Valve: The mitral valve is normal in structure. There is mild thickening  of the mitral valve leaflet(s). There is mild calcification of the mitral valve leaflet(s). Mild mitral annular calcification. No evidence of mitral valve regurgitation. No evidence of mitral valve stenosis. Tricuspid Valve: The tricuspid valve is normal in structure. Tricuspid valve regurgitation is not demonstrated. No evidence of tricuspid stenosis. Aortic Valve: The aortic valve is normal in structure. There is mild calcification of the aortic valve. There is mild thickening of the aortic valve. Aortic valve regurgitation is not visualized. Aortic valve sclerosis is present, with no evidence of aortic valve stenosis. Pulmonic Valve: The pulmonic valve was normal in structure. Pulmonic valve regurgitation is not visualized. No evidence of pulmonic stenosis. Aorta: The aortic root is normal in size and structure. Venous: The inferior vena cava is normal in size with greater than 50% respiratory variability, suggesting right atrial pressure of 3 mmHg. IAS/Shunts: No atrial level shunt detected by color flow Doppler.  LEFT VENTRICLE PLAX 2D LVIDd:         2.80 cm   Diastology LVIDs:         2.00 cm   LV e' medial:    10.90 cm/s LV PW:         1.60 cm   LV E/e' medial:  9.3 LV IVS:  0.90 cm   LV e' lateral:   9.32 cm/s LVOT diam:     1.70 cm   LV E/e' lateral: 10.8 LV SV:         37 LV SV Index:   23 LVOT Area:     2.27 cm  RIGHT VENTRICLE RV S prime:     12.00 cm/s TAPSE (M-mode): 1.2 cm LEFT ATRIUM           Index        RIGHT ATRIUM           Index LA diam:      3.50 cm 2.18 cm/m   RA Area:     14.30 cm LA Vol (A4C): 27.3 ml 16.98 ml/m  RA Volume:   36.20 ml  22.52 ml/m  AORTIC VALVE LVOT Vmax:   94.00 cm/s LVOT Vmean:  64.800 cm/s LVOT VTI:    0.161 m  AORTA Ao Root diam: 2.70 cm Ao Asc diam:  3.70 cm MITRAL VALVE                TRICUSPID VALVE MV Area (PHT): 8.72 cm     TR Peak grad:   2.8 mmHg MV Decel Time: 87 msec      TR Vmax:        83.60 cm/s MR Peak grad: 4.8 mmHg MR Vmax:      109.00 cm/s    SHUNTS MV E velocity: 101.00 cm/s  Systemic VTI:  0.16 m MV A velocity: 47.10 cm/s   Systemic Diam: 1.70 cm MV E/A ratio:  2.14 Donato Schultz MD Electronically signed by Donato Schultz MD Signature Date/Time: 11/27/2022/3:40:27 PM    Final    DG THORACOLUMABAR SPINE  Result Date: 11/27/2022 CLINICAL DATA:  Fall.  Back pain. EXAM: THORACOLUMBAR SPINE 1V COMPARISON:  Chest radiographs, 07/24/2015. FINDINGS: No fracture.  No bone lesion.  No spondylolisthesis. Mild curvature, convex the right, apex at T8. Skeletal structures are diffusely demineralized. Mild loss of disc height with endplate osteophytes noted throughout the thoracic spine. Soft tissues are unremarkable. IMPRESSION: 1. No fracture or acute finding. Electronically Signed   By: Amie Portland M.D.   On: 11/27/2022 13:22   DG Ribs Unilateral Left  Result Date: 11/27/2022 CLINICAL DATA:  Fall.  Left-sided chest pain. EXAM: LEFT RIBS - 2 VIEW COMPARISON:  11/26/2022. FINDINGS: No fracture or other bone lesions are seen involving the ribs. IMPRESSION: Negative. Electronically Signed   By: Amie Portland M.D.   On: 11/27/2022 13:20   CT HEAD WO CONTRAST ( )  Result Date: 11/27/2022 CLINICAL DATA:  89 year old female status post fall with trace subarachnoid hemorrhage suspected in the right frontal lobe. EXAM: CT HEAD WITHOUT CONTRAST TECHNIQUE: Contiguous axial images were obtained from the base of the skull through the vertex without intravenous contrast. RADIATION DOSE REDUCTION: This exam was performed according to the departmental dose-optimization program which includes automated exposure control, adjustment of the mA and/or kV according to patient size and/or use of iterative reconstruction technique. COMPARISON:  Head CT yesterday. FINDINGS: Brain: No midline shift, mass effect, or evidence of intracranial mass lesion. No ventriculomegaly. No intraventricular hemorrhage. No convincing extra-axial blood or acute intracranial hemorrhage on these images.  Stable gray-white matter differentiation throughout the brain. Small area of chronic appearing encephalomalacia left middle frontal gyrus (coronal image 33). No convincing acute cortically based infarct. Vascular: Calcified atherosclerosis at the skull base. No suspicious intracranial vascular hyperdensity. Skull: TMJ degeneration.  No acute osseous abnormality identified. Sinuses/Orbits:  Visualized paranasal sinuses and mastoids are stable and well aerated. Other: Stable orbit and scalp soft tissues. IMPRESSION: 1. No convincing intracranial hemorrhage on this exam. 2. No acute intracranial abnormality. Small area of chronic encephalomalacia in the left middle frontal gyrus. Electronically Signed   By: Odessa Fleming M.D.   On: 11/27/2022 06:48   CT HEAD WO CONTRAST ( )  Result Date: 11/26/2022 CLINICAL DATA:  Fall EXAM: CT HEAD WITHOUT CONTRAST TECHNIQUE: Contiguous axial images were obtained from the base of the skull through the vertex without intravenous contrast. RADIATION DOSE REDUCTION: This exam was performed according to the departmental dose-optimization program which includes automated exposure control, adjustment of the mA and/or kV according to patient size and/or use of iterative reconstruction technique. COMPARISON:  04/27/2017 FINDINGS: Brain: Possible trace subarachnoid hemorrhage in the right frontal lobe (series 3, image 23). No evidence of parenchymal or subdural hemorrhage. No evidence of acute infarct, mass, mass effect, or midline shift. No hydrocephalus. Vascular: No hyperdense vessel. Atherosclerotic calcifications in the intracranial carotid and vertebral arteries. Skull: Normal. Negative for fracture or focal lesion. Sinuses/Orbits: No acute finding. Other: The mastoid air cells are well aerated. IMPRESSION: Possible trace subarachnoid hemorrhage in the right frontal lobe. No mass effect. These results were called by telephone at the time of interpretation on 11/26/2022 at 11:48 pm to  provider [facility], who verbally acknowledged these results. Electronically Signed   By: Wiliam Ke M.D.   On: 11/26/2022 23:48   CT Cervical Spine Wo Contrast  Result Date: 11/26/2022 CLINICAL DATA:  Neck trauma (Age >= 65y) Fall in the shower. EXAM: CT CERVICAL SPINE WITHOUT CONTRAST TECHNIQUE: Multidetector CT imaging of the cervical spine was performed without intravenous contrast. Multiplanar CT image reconstructions were also generated. RADIATION DOSE REDUCTION: This exam was performed according to the departmental dose-optimization program which includes automated exposure control, adjustment of the mA and/or kV according to patient size and/or use of iterative reconstruction technique. COMPARISON:  CT cervical spine 08/28/2018 FINDINGS: Alignment: Normal. Skull base and vertebrae: Anterior fusion C5 through C7 with interbody spacers. Intact hardware. No acute fracture. The dens and skull base are intact. Moderate C1-C2 degenerative change. Soft tissues and spinal canal: No prevertebral fluid or swelling. No visible canal hematoma. Disc levels: C3-C4 and C4-C5 disc space narrowing and spurring. Posterior spurring causes mass effect on the spinal canal, similar to prior exam. There is also posterior spurring at C6-C7 causing spinal canal narrowing. Moderate multilevel facet hypertrophy. Upper chest: No acute or unexpected findings. Other: Carotid calcifications. IMPRESSION: 1. No acute fracture or subluxation of the cervical spine. 2. Degenerative and postsurgical change. Electronically Signed   By: Narda Rutherford M.D.   On: 11/26/2022 23:22   DG Chest Port 1 View  Result Date: 11/26/2022 CLINICAL DATA:  Fall. EXAM: PORTABLE CHEST 1 VIEW COMPARISON:  12/10/2018 FINDINGS: Shallow inspiration. Heart size and pulmonary vascularity are normal. Blunting of the left costophrenic angles suggesting a small left pleural effusion. Lungs are clear. Calcification of the aorta. Degenerative changes in the  spine. Postoperative changes in the cervical spine. IMPRESSION: Small left pleural effusion.  No focal consolidation. Electronically Signed   By: Burman Nieves M.D.   On: 11/26/2022 23:10   DG Pelvis Portable  Result Date: 11/26/2022 CLINICAL DATA:  Fall yesterday.  Left hip pain. EXAM: PORTABLE PELVIS 1-2 VIEWS COMPARISON:  None Available. FINDINGS: Degenerative changes in the spine and hips. No evidence of acute fracture or dislocation. No focal bone lesion or bone destruction. SI  joints and symphysis pubis are not displaced. Soft tissues are unremarkable. IMPRESSION: Degenerative changes in the hips.  No acute bony abnormalities. Electronically Signed   By: Burman Nieves M.D.   On: 11/26/2022 23:09      Signature  -   Susa Raring M.D on 11/28/2022 at 9:58 AM   -  To page go to [email or web address]

## 2022-11-28 NOTE — Evaluation (Signed)
Physical Therapy Evaluation Patient Details Name: Kimberly Bass MRN: [id number] DOB: 10-14-1925 Today's Date: 11/28/2022  History of Present Illness  Kimberly Bass is a 89 y.o. female who presents from [facility] after ground-level mechanical fall. CT head showed possible trace subarachnoid hemorrhage in the right frontal lobe. PMHx: essential pretension, hyperlipidemia, acquired hypothyroidism.   Clinical Impression  Kimberly Bass is 89 y.o. female admitted with above HPI and diagnosis. Patient is currently limited by functional impairments below (see PT problem list). Patient has been a resident at ALF and is was mobilizing with Rollator however has been declining in strength and mobility recently and has begun falling at facility. Today pt required min assist for transfers and short bout of gait in room with RW. She has poor attention and requires repeated cues to focus on task. Patient will benefit from continued skilled PT interventions to address impairments and progress independence with mobility. Acute PT will follow and progress as able.        Recommendations for follow up therapy are one component of a multi-disciplinary discharge planning process, led by the attending physician.  Recommendations may be updated based on patient status, additional functional criteria and insurance authorization.  Follow Up Recommendations Can patient physically be transported by private vehicle: Yes     Assistance Recommended at Discharge Frequent or constant Supervision/Assistance  Patient can return home with the following  A lot of help with walking and/or transfers;A lot of help with bathing/dressing/bathroom;Assistance with cooking/housework;Direct supervision/assist for medications management;Direct supervision/assist for financial management;Assist for transportation;Help with stairs or ramp for entrance    Equipment Recommendations Other (comment) (tbd at next venue)   Recommendations for Other Services       Functional Status Assessment Patient has had a recent decline in their functional status and demonstrates the ability to make significant improvements in function in a reasonable and predictable amount of time.     Precautions / Restrictions Precautions Precautions: Fall Restrictions Weight Bearing Restrictions: No      Mobility  Bed Mobility Overal bed mobility: Needs Assistance Bed Mobility: Supine to Sit     Supine to sit: Supervision, HOB elevated     General bed mobility comments: HOB elevated, use of bed rails, cues given for initiation and use of rails.    Transfers Overall transfer level: Needs assistance Equipment used: Rolling walker (2 wheels) Transfers: Sit to/from Stand, Bed to chair/wheelchair/BSC Sit to Stand: Min assist Stand pivot transfers: Min assist         General transfer comment: min assist for sit<>stand from EOB and to guide walker and small steps to move bed>chair. pt required cues for hand placement throughout and assist to manage lines.    Ambulation/Gait Ambulation/Gait assistance: Min assist Gait Distance (Feet): 6 Feet Assistive device: Rolling walker (2 wheels) Gait Pattern/deviations: Step-to pattern, Decreased step length - right, Decreased step length - left, Decreased stride length, Shuffle, Trunk flexed Gait velocity: decr     General Gait Details: min assist to guide walker and maintain safe position/proximity as pt tending to advance walker too far forward resulting in further trunk flexion/poor posture.  Stairs            Wheelchair Mobility    Modified Rankin (Stroke Patients Only)       Balance Overall balance assessment: Needs assistance, History of Falls Sitting-balance support: Feet supported, Single extremity supported Sitting balance-Leahy Scale: Poor Sitting balance - Comments: sitting EOB   Standing balance support: Bilateral upper extremity  supported,  Reliant on assistive device for balance Standing balance-Leahy Scale: Poor Standing balance comment: BUE support on RW for stability                             Pertinent Vitals/Pain Pain Assessment Pain Assessment: Faces Faces Pain Scale: Hurts little more Pain Location: Lt LE in hip and knee Pain Descriptors / Indicators: Discomfort, Grimacing Pain Intervention(s): Limited activity within patient's tolerance, Monitored during session, Repositioned    Home Living Family/patient expects to be discharged to:: Assisted living                 Home Equipment: Rollator (4 wheels) Additional Comments: Son requesting pt transition to SNF    Prior Function Prior Level of Function : Needs assist;History of Falls (last six months)             Mobility Comments: uses a rollator at baseline with significant fall history. Son reports pt is unable to ambulate to dining hall recently ADLs Comments: Pt recieves assist for bathing. Son reports pt has lately been wearing the same clothes multiple days in a row. he does not feel the staff at facility are assisting with ADL's as often as they should.     Hand Dominance   Dominant Hand: Left    Extremity/Trunk Assessment   Upper Extremity Assessment Upper Extremity Assessment: Defer to OT evaluation    Lower Extremity Assessment Lower Extremity Assessment: Generalized weakness    Cervical / Trunk Assessment Cervical / Trunk Assessment: Kyphotic  Communication   Communication: No difficulties  Cognition Arousal/Alertness: Awake/alert Behavior During Therapy: WFL for tasks assessed/performed Overall Cognitive Status: History of cognitive impairments - at baseline                                 General Comments: Pt with dementia at baseline. Self distracting and requiring max verbal cues for redirection throughout session. Benefits from simple 1-step commands with increased time.        General  Comments      Exercises     Assessment/Plan    PT Assessment Patient needs continued PT services  PT Problem List Decreased strength;Decreased activity tolerance;Decreased balance;Decreased mobility;Decreased cognition;Decreased knowledge of use of DME;Decreased safety awareness;Decreased knowledge of precautions;Obesity       PT Treatment Interventions DME instruction;Cognitive remediation;Neuromuscular re-education;Balance training;Therapeutic exercise;Therapeutic activities;Functional mobility training;Stair training;Gait training;Patient/family education;Wheelchair mobility training    PT Goals (Current goals can be found in the Care Plan section)  Acute Rehab PT Goals PT Goal Formulation: With patient Time For Goal Achievement: 12/12/22 Potential to Achieve Goals: Good    Frequency Min 3X/week     Co-evaluation               AM-PAC PT "6 Clicks" Mobility  Outcome Measure Help needed turning from your back to your side while in a flat bed without using bedrails?: A Little Help needed moving from lying on your back to sitting on the side of a flat bed without using bedrails?: A Little Help needed moving to and from a bed to a chair (including a wheelchair)?: A Little Help needed standing up from a chair using your arms (e.g., wheelchair or bedside chair)?: A Little Help needed to walk in hospital room?: A Little Help needed climbing 3-5 steps with a railing? : Total 6 Click Score: 16    End  of Session Equipment Utilized During Treatment: Gait belt Activity Tolerance: Patient tolerated treatment well Patient left: in chair;with call bell/phone within reach;with chair alarm set Nurse Communication: Mobility status PT Visit Diagnosis: Unsteadiness on feet (R26.81);Other abnormalities of gait and mobility (R26.89);Muscle weakness (generalized) (M62.81);Difficulty in walking, not elsewhere classified (R26.2);History of falling (Z91.81);Other symptoms and signs involving  the nervous system (R29.898)    Time: 0981-1914 PT Time Calculation (min) (ACUTE ONLY): 23 min   Charges:   PT Evaluation $PT Eval Low Complexity: 1 Low PT Treatments $Therapeutic Activity: 8-22 mins       Wynn Maudlin, DPT Acute Rehabilitation Services Office [phone]  11/28/22 4:32 PM

## 2022-11-29 DIAGNOSIS — I609 Nontraumatic subarachnoid hemorrhage, unspecified: Secondary | ICD-10-CM | POA: Diagnosis not present

## 2022-11-29 MED ORDER — LACTATED RINGERS IV SOLN: INTRAVENOUS | Status: AC

## 2022-11-29 MED ORDER — ISOSORBIDE MONONITRATE ER 30 MG PO TB24
15.0000 mg | ORAL_TABLET | Freq: Every day | ORAL | Status: DC
  Administered 2022-11-30: 15 mg via ORAL
  Filled 2022-11-29: qty 1

## 2022-11-29 NOTE — Progress Notes (Signed)
PROGRESS NOTE                                                                                                                                                                                                             Patient Demographics:    Kimberly Bass, is a 89 y.o. female, DOB - 05/06/1926, WUJ:[id number]  Outpatient Primary MD for the patient is Merri Brunette, MD    LOS - 1  Admit date - 11/26/2022    Chief Complaint  Patient presents with   Fall       Brief Narrative (HPI from H&P)   89 y.o. female with medical history significant for essential pretension, hyperlipidemia, acquired hypothyroidism, who is admitted to [facility] on 11/26/2022 with potential small subarachnoid hemorrhage after ground-level mechanical fall, after presenting from SNF to [facility] ED for evaluation of fall.    Subjective:   Patient in bed, appears comfortable, denies any headache, no fever, no chest pain or pressure, no shortness of breath , no abdominal pain. No new focal weakness.   Assessment  & Plan :    Mechanical fall at assisted living with questionable subarachnoid hemorrhage on initial CT brain on 11/27/2022.  Repeat CT scan on 11/27/2022 about 10 hours later was stable, no headache or focal deficits, since on dual antiplatelet therapy will repeat CT scan on 11/28/2022 to make sure there is no bleed and then commence DAPT with caution.  Weakness, multiple falls at ALF.  Son now wants SNF placement.  PT OT and TOC consulted.  Acute cystitis.  On Rocephin.  Follow cultures, on Mybertriq.  Dyslipidemia.  On statin.  GERD . PPI  Hypothyroidism - on synthroid.  HTN - metoprolol tartrate, amlodipine, Imdur.      Condition - Extremely Guarded  Family Communication  :  son on 11/28/22  Code Status :  DNR  Consults  :  None  PUD Prophylaxis :     Procedures  :     CT - 1. Left ventricular ejection fraction, by  estimation, is 70 to 75%. The left ventricle has hyperdynamic function. The left ventricle has no regional wall motion abnormalities. Left ventricular diastolic parameters are indeterminate.  2. Right ventricular systolic function is normal. The right ventricular size is normal. There is normal pulmonary artery systolic pressure.  3. The mitral valve  is normal in structure. No evidence of mitral valve regurgitation. No evidence of mitral stenosis.  4. The aortic valve is normal in structure. There is mild calcification of the aortic valve. There is mild thickening of the aortic valve. Aortic valve regurgitation is not visualized. Aortic valve sclerosis is present, with no evidence of aortic valve stenosis.  5. The inferior vena cava is normal in size with greater than 50% respiratory variability, suggesting right atrial pressure of 3 mmHg.       Disposition Plan  :    Status is: Observation   DVT Prophylaxis  :    Place and maintain sequential compression device Start: 11/27/22 1317 Place TED hose Start: 11/27/22 1317 SCDs Start: 11/27/22 0132    Lab Results  Component Value Date   PLT 123 (L) 11/27/2022    Diet :  Diet Order             Diet regular Room service appropriate? Yes; Fluid consistency: Thin  Diet effective now                    Inpatient Medications  Scheduled Meds:  acetaminophen  1,000 mg Oral Q8H   fesoterodine  4 mg Oral Daily   gabapentin  400 mg Oral QID   [START ON 11/30/2022] isosorbide mononitrate  15 mg Oral Daily   levothyroxine  88 mcg Oral QAC breakfast   metoprolol tartrate  25 mg Oral BID   pantoprazole  20 mg Oral QAC breakfast   triamcinolone cream  1 Application Topical BID   Continuous Infusions:  cefTRIAXone (ROCEPHIN)  IV 1 g (11/29/22 0306)   lactated ringers 100 mL/hr at 11/29/22 0606   PRN Meds:.acetaminophen **OR** acetaminophen, hydrALAZINE, melatonin, ondansetron (ZOFRAN) IV, polyethylene glycol, traMADol  Antibiotics  :     Anti-infectives (From admission, onward)    Start     Dose/Rate Route Frequency Ordered Stop   11/27/22 0315  cefTRIAXone (ROCEPHIN) 1 g in sodium chloride 0.9 % 100 mL IVPB       Note to Pharmacy: (It looks like patient received Rocephin in Feb '20, without any adverse rxn).   1 g 200 mL/hr over 30 Minutes Intravenous Every 24 hours 11/27/22 0312           Objective:   Vitals:   11/29/22 0400 11/29/22 0800 11/29/22 0840 11/29/22 0927  BP: (!) 116/55 (!) 100/50 130/71   Pulse: 71 63 70   Resp: (!) 25 (!) 24 (!) 22   Temp:  97.9 F (36.6 C)    TempSrc:  Oral    SpO2: 93% 95%  92%  Weight:      Height:        Wt Readings from Last 3 Encounters:  11/26/22 60.3 kg  12/12/18 65.3 kg  10/17/18 67 kg     Intake/Output Summary (Last 24 hours) at 11/29/2022 1000 Last data filed at 11/29/2022 0500 Gross per 24 hour  Intake --  Output 1600 ml  Net -1600 ml     Physical Exam  Awake Alert, No new F.N deficits, Normal affect [place].AT,PERRAL Supple Neck, No JVD,   Symmetrical Chest wall movement, Good air movement bilaterally, CTAB RRR,No Gallops,Rubs or new Murmurs,  +ve B.Sounds, Abd Soft, No tenderness,   No Cyanosis, Clubbing or edema        Data Review:    Recent Labs  Lab 11/26/22 2245 11/26/22 2258 11/27/22 0206  WBC 7.7  --  7.6  HGB 13.4 13.9  12.9  HCT 40.8 41.0 39.3  PLT 124*  --  123*  MCV 87.0  --  86.8  MCH 28.6  --  28.5  MCHC 32.8  --  32.8  RDW 14.4  --  14.3  LYMPHSABS 1.0  --  1.3  MONOABS 0.7  --  0.8  EOSABS 0.1  --  0.1  BASOSABS 0.0  --  0.0    Recent Labs  Lab 11/26/22 2245 11/26/22 2258 11/27/22 0206 11/28/22 0642  NA  --  135 134* 135  K  --  3.8 3.7 3.7  CL  --  99 102 96*  CO2  --   --  22 25  ANIONGAP  --   --  10 14  GLUCOSE  --  117* 111* 111*  BUN  --  11 8 9   CREATININE  --  0.70 0.71 0.96  AST  --   --  24  --   ALT  --   --  21  --   ALKPHOS  --   --  45  --   BILITOT  --   --  0.9  --   ALBUMIN  --   --   3.3*  --   INR 1.0  --   --   --   MG  --   --  1.5* 1.7  CALCIUM  --   --  8.8* 8.8*      Recent Labs  Lab 11/26/22 2245 11/27/22 0206 11/28/22 0642  INR 1.0  --   --   MG  --  1.5* 1.7  CALCIUM  --  8.8* 8.8*    Recent Labs  Lab 11/26/22 2245 11/26/22 2258 11/27/22 0206 11/28/22 0642  WBC 7.7  --  7.6  --   PLT 124*  --  123*  --   CREATININE  --  0.70 0.71 0.96    ------------------------------------------------------------------------------------------------------------------ No results found for: "CHOL", "HDL", "LDLCALC", "LDLDIRECT", "TRIG", "CHOLHDL"  No results found for: "HGBA1C"  No results for input(s): "TSH", "T4TOTAL", "T3FREE", "THYROIDAB" in the last 72 hours.  Invalid input(s): "FREET3" ------------------------------------------------------------------------------------------------------------------ Cardiac Enzymes No results for input(s): "CKMB", "TROPONINI", "MYOGLOBIN" in the last 168 hours.  Invalid input(s): "CK"  Micro Results No results found for this or any previous visit (from the past 240 hour(s)).  Radiology Reports CT HEAD WO CONTRAST ( )  Result Date: 11/28/2022 CLINICAL DATA:  89 year old female status post fall with possible trace subarachnoid hemorrhage on initial head CT. EXAM: CT HEAD WITHOUT CONTRAST TECHNIQUE: Contiguous axial images were obtained from the base of the skull through the vertex without intravenous contrast. RADIATION DOSE REDUCTION: This exam was performed according to the departmental dose-optimization program which includes automated exposure control, adjustment of the mA and/or kV according to patient size and/or use of iterative reconstruction technique. COMPARISON:  Repeat head CT 11/27/2022 and earlier. FINDINGS: Brain: Stable non contrast CT appearance of the brain. No midline shift, mass effect, or evidence of intracranial mass lesion. No ventriculomegaly. No IVH. No intracranial hemorrhage identified. No  cortically based acute infarct identified. Small area of chronic left middle frontal gyrus encephalomalacia was present on a 2007 MRI (coronal image 32). Stable gray-white matter differentiation throughout the brain. Vascular: Calcified atherosclerosis at the skull base. Skull: Hyperostosis and TMJ degeneration. No acute osseous abnormality identified. Sinuses/Orbits: Visualized paranasal sinuses and mastoids are stable and well aerated. Other: No acute orbit or scalp soft tissue finding. IMPRESSION: No acute intracranial hemorrhage or acute intracranial  abnormality identified. Electronically Signed   By: Odessa Fleming M.D.   On: 11/28/2022 12:47   ECHOCARDIOGRAM COMPLETE  Result Date: 11/27/2022    ECHOCARDIOGRAM REPORT   Patient Name:   AHMINA ZOLL Date of Exam: 11/27/2022 Medical Rec #:  [id number]            Height:       62.0 in Accession #:    [id number]           Weight:       133.0 lb Date of Birth:  Apr 12, 1926             BSA:          1.607 m Patient Age:    89 years             BP:           161/90 mmHg Patient Gender: F                    HR:           101 bpm. Exam Location:  Inpatient Procedure: 2D Echo, Cardiac Doppler and Color Doppler Indications:    Congestive Heart Failure I50.9  History:        Patient has prior history of Echocardiogram examinations, most                 recent 08/30/2018. CAD and Previous Myocardial Infarction, TIA and                 Stroke, Signs/Symptoms:Altered Mental Status and Shortness of                 Breath; Risk Factors:Hypertension and Dyslipidemia.  Sonographer:    Aron Baba Referring Phys: [id number] [facility] M PATEL  Sonographer Comments: No subcostal window. IMPRESSIONS  1. Left ventricular ejection fraction, by estimation, is 70 to 75%. The left ventricle has hyperdynamic function. The left ventricle has no regional wall motion abnormalities. Left ventricular diastolic parameters are indeterminate.  2. Right ventricular systolic function is normal. The right  ventricular size is normal. There is normal pulmonary artery systolic pressure.  3. The mitral valve is normal in structure. No evidence of mitral valve regurgitation. No evidence of mitral stenosis.  4. The aortic valve is normal in structure. There is mild calcification of the aortic valve. There is mild thickening of the aortic valve. Aortic valve regurgitation is not visualized. Aortic valve sclerosis is present, with no evidence of aortic valve stenosis.  5. The inferior vena cava is normal in size with greater than 50% respiratory variability, suggesting right atrial pressure of 3 mmHg. FINDINGS  Left Ventricle: Left ventricular ejection fraction, by estimation, is 70 to 75%. The left ventricle has hyperdynamic function. The left ventricle has no regional wall motion abnormalities. The left ventricular internal cavity size was normal in size. There is no left ventricular hypertrophy. Left ventricular diastolic parameters are indeterminate. Right Ventricle: The right ventricular size is normal. No increase in right ventricular wall thickness. Right ventricular systolic function is normal. There is normal pulmonary artery systolic pressure. The tricuspid regurgitant velocity is 0.84 m/s, and  with an assumed right atrial pressure of 8 mmHg, the estimated right ventricular systolic pressure is 10.8 mmHg. Left Atrium: Left atrial size was normal in size. Right Atrium: Right atrial size was normal in size. Pericardium: There is no evidence of pericardial effusion. Mitral Valve: The mitral valve is normal in structure. There is mild thickening  of the mitral valve leaflet(s). There is mild calcification of the mitral valve leaflet(s). Mild mitral annular calcification. No evidence of mitral valve regurgitation. No evidence of mitral valve stenosis. Tricuspid Valve: The tricuspid valve is normal in structure. Tricuspid valve regurgitation is not demonstrated. No evidence of tricuspid stenosis. Aortic Valve: The aortic  valve is normal in structure. There is mild calcification of the aortic valve. There is mild thickening of the aortic valve. Aortic valve regurgitation is not visualized. Aortic valve sclerosis is present, with no evidence of aortic valve stenosis. Pulmonic Valve: The pulmonic valve was normal in structure. Pulmonic valve regurgitation is not visualized. No evidence of pulmonic stenosis. Aorta: The aortic root is normal in size and structure. Venous: The inferior vena cava is normal in size with greater than 50% respiratory variability, suggesting right atrial pressure of 3 mmHg. IAS/Shunts: No atrial level shunt detected by color flow Doppler.  LEFT VENTRICLE PLAX 2D LVIDd:         2.80 cm   Diastology LVIDs:         2.00 cm   LV e' medial:    10.90 cm/s LV PW:         1.60 cm   LV E/e' medial:  9.3 LV IVS:        0.90 cm   LV e' lateral:   9.32 cm/s LVOT diam:     1.70 cm   LV E/e' lateral: 10.8 LV SV:         37 LV SV Index:   23 LVOT Area:     2.27 cm  RIGHT VENTRICLE RV S prime:     12.00 cm/s TAPSE (M-mode): 1.2 cm LEFT ATRIUM           Index        RIGHT ATRIUM           Index LA diam:      3.50 cm 2.18 cm/m   RA Area:     14.30 cm LA Vol (A4C): 27.3 ml 16.98 ml/m  RA Volume:   36.20 ml  22.52 ml/m  AORTIC VALVE LVOT Vmax:   94.00 cm/s LVOT Vmean:  64.800 cm/s LVOT VTI:    0.161 m  AORTA Ao Root diam: 2.70 cm Ao Asc diam:  3.70 cm MITRAL VALVE                TRICUSPID VALVE MV Area (PHT): 8.72 cm     TR Peak grad:   2.8 mmHg MV Decel Time: 87 msec      TR Vmax:        83.60 cm/s MR Peak grad: 4.8 mmHg MR Vmax:      109.00 cm/s   SHUNTS MV E velocity: 101.00 cm/s  Systemic VTI:  0.16 m MV A velocity: 47.10 cm/s   Systemic Diam: 1.70 cm MV E/A ratio:  2.14 Donato Schultz MD Electronically signed by Donato Schultz MD Signature Date/Time: 11/27/2022/3:40:27 PM    Final    DG THORACOLUMABAR SPINE  Result Date: 11/27/2022 CLINICAL DATA:  Fall.  Back pain. EXAM: THORACOLUMBAR SPINE 1V COMPARISON:  Chest  radiographs, 07/24/2015. FINDINGS: No fracture.  No bone lesion.  No spondylolisthesis. Mild curvature, convex the right, apex at T8. Skeletal structures are diffusely demineralized. Mild loss of disc height with endplate osteophytes noted throughout the thoracic spine. Soft tissues are unremarkable. IMPRESSION: 1. No fracture or acute finding. Electronically Signed   By: Amie Portland M.D.   On: 11/27/2022 13:22  DG Ribs Unilateral Left  Result Date: 11/27/2022 CLINICAL DATA:  Fall.  Left-sided chest pain. EXAM: LEFT RIBS - 2 VIEW COMPARISON:  11/26/2022. FINDINGS: No fracture or other bone lesions are seen involving the ribs. IMPRESSION: Negative. Electronically Signed   By: Amie Portland M.D.   On: 11/27/2022 13:20   CT HEAD WO CONTRAST ( )  Result Date: 11/27/2022 CLINICAL DATA:  89 year old female status post fall with trace subarachnoid hemorrhage suspected in the right frontal lobe. EXAM: CT HEAD WITHOUT CONTRAST TECHNIQUE: Contiguous axial images were obtained from the base of the skull through the vertex without intravenous contrast. RADIATION DOSE REDUCTION: This exam was performed according to the departmental dose-optimization program which includes automated exposure control, adjustment of the mA and/or kV according to patient size and/or use of iterative reconstruction technique. COMPARISON:  Head CT yesterday. FINDINGS: Brain: No midline shift, mass effect, or evidence of intracranial mass lesion. No ventriculomegaly. No intraventricular hemorrhage. No convincing extra-axial blood or acute intracranial hemorrhage on these images. Stable gray-white matter differentiation throughout the brain. Small area of chronic appearing encephalomalacia left middle frontal gyrus (coronal image 33). No convincing acute cortically based infarct. Vascular: Calcified atherosclerosis at the skull base. No suspicious intracranial vascular hyperdensity. Skull: TMJ degeneration.  No acute osseous abnormality  identified. Sinuses/Orbits: Visualized paranasal sinuses and mastoids are stable and well aerated. Other: Stable orbit and scalp soft tissues. IMPRESSION: 1. No convincing intracranial hemorrhage on this exam. 2. No acute intracranial abnormality. Small area of chronic encephalomalacia in the left middle frontal gyrus. Electronically Signed   By: Odessa Fleming M.D.   On: 11/27/2022 06:48   CT HEAD WO CONTRAST ( )  Result Date: 11/26/2022 CLINICAL DATA:  Fall EXAM: CT HEAD WITHOUT CONTRAST TECHNIQUE: Contiguous axial images were obtained from the base of the skull through the vertex without intravenous contrast. RADIATION DOSE REDUCTION: This exam was performed according to the departmental dose-optimization program which includes automated exposure control, adjustment of the mA and/or kV according to patient size and/or use of iterative reconstruction technique. COMPARISON:  04/27/2017 FINDINGS: Brain: Possible trace subarachnoid hemorrhage in the right frontal lobe (series 3, image 23). No evidence of parenchymal or subdural hemorrhage. No evidence of acute infarct, mass, mass effect, or midline shift. No hydrocephalus. Vascular: No hyperdense vessel. Atherosclerotic calcifications in the intracranial carotid and vertebral arteries. Skull: Normal. Negative for fracture or focal lesion. Sinuses/Orbits: No acute finding. Other: The mastoid air cells are well aerated. IMPRESSION: Possible trace subarachnoid hemorrhage in the right frontal lobe. No mass effect. These results were called by telephone at the time of interpretation on 11/26/2022 at 11:48 pm to provider [facility], who verbally acknowledged these results. Electronically Signed   By: Wiliam Ke M.D.   On: 11/26/2022 23:48   CT Cervical Spine Wo Contrast  Result Date: 11/26/2022 CLINICAL DATA:  Neck trauma (Age >= 65y) Fall in the shower. EXAM: CT CERVICAL SPINE WITHOUT CONTRAST TECHNIQUE: Multidetector CT imaging of the cervical spine was performed  without intravenous contrast. Multiplanar CT image reconstructions were also generated. RADIATION DOSE REDUCTION: This exam was performed according to the departmental dose-optimization program which includes automated exposure control, adjustment of the mA and/or kV according to patient size and/or use of iterative reconstruction technique. COMPARISON:  CT cervical spine 08/28/2018 FINDINGS: Alignment: Normal. Skull base and vertebrae: Anterior fusion C5 through C7 with interbody spacers. Intact hardware. No acute fracture. The dens and skull base are intact. Moderate C1-C2 degenerative change. Soft tissues and spinal canal: No prevertebral  fluid or swelling. No visible canal hematoma. Disc levels: C3-C4 and C4-C5 disc space narrowing and spurring. Posterior spurring causes mass effect on the spinal canal, similar to prior exam. There is also posterior spurring at C6-C7 causing spinal canal narrowing. Moderate multilevel facet hypertrophy. Upper chest: No acute or unexpected findings. Other: Carotid calcifications. IMPRESSION: 1. No acute fracture or subluxation of the cervical spine. 2. Degenerative and postsurgical change. Electronically Signed   By: Narda Rutherford M.D.   On: 11/26/2022 23:22   DG Chest Port 1 View  Result Date: 11/26/2022 CLINICAL DATA:  Fall. EXAM: PORTABLE CHEST 1 VIEW COMPARISON:  12/10/2018 FINDINGS: Shallow inspiration. Heart size and pulmonary vascularity are normal. Blunting of the left costophrenic angles suggesting a small left pleural effusion. Lungs are clear. Calcification of the aorta. Degenerative changes in the spine. Postoperative changes in the cervical spine. IMPRESSION: Small left pleural effusion.  No focal consolidation. Electronically Signed   By: Burman Nieves M.D.   On: 11/26/2022 23:10   DG Pelvis Portable  Result Date: 11/26/2022 CLINICAL DATA:  Fall yesterday.  Left hip pain. EXAM: PORTABLE PELVIS 1-2 VIEWS COMPARISON:  None Available. FINDINGS:  Degenerative changes in the spine and hips. No evidence of acute fracture or dislocation. No focal bone lesion or bone destruction. SI joints and symphysis pubis are not displaced. Soft tissues are unremarkable. IMPRESSION: Degenerative changes in the hips.  No acute bony abnormalities. Electronically Signed   By: Burman Nieves M.D.   On: 11/26/2022 23:09      Signature  -   Susa Raring M.D on 11/29/2022 at 10:00 AM   -  To page go to [email or web address]

## 2022-11-29 NOTE — [place] FL2 (Signed)
[place] MEDICAID FL2 LEVEL OF CARE FORM     IDENTIFICATION  Patient Name: Kimberly Bass Birthdate: Feb 01, 1926 Sex: female Admission Date (Current Location): 11/26/2022  [facility] and [place] Number:  Producer, television/film/video and Address:  [facility], [address], [place], [place] [address]      Provider Number: [id number]  Attending Physician Name and Address:  Leroy Sea, MD  Relative Name and Phone Number:       Current Level of Care: Hospital Recommended Level of Care: Skilled Nursing Facility Prior Approval Number:    Date Approved/Denied:   PASRR Number: [id number]  Discharge Plan: SNF    Current Diagnoses: Patient Active Problem List   Diagnosis Date Noted   Subarachnoid bleed (HCC) 11/27/2022   Fall at home, initial encounter 11/27/2022   Urinary incontinence 11/27/2022   GERD (gastroesophageal reflux disease) 11/27/2022   Subarachnoid hemorrhage following injury, no loss of consciousness (HCC) 11/27/2022   Chest pain, rule out acute myocardial infarction 12/10/2018   Cutaneous vasculitis 09/19/2018   Asthmatic bronchitis 09/04/2018   Syncope 09/01/2018   PAF (paroxysmal atrial fibrillation) (HCC) 09/01/2018   Traumatic hematoma of knee, left, initial encounter 09/01/2018   RLS (restless legs syndrome) 09/01/2018   Bradycardia 08/30/2018   Elevated troponin 08/30/2018   Rhabdomyolysis 08/29/2018   Hypomagnesemia    Pyuria 08/01/2018   AMS (altered mental status) 08/01/2018   Altered mental status 08/01/2018   Acquired hypothyroidism 12/12/2017   Acute encephalopathy 12/12/2017   Essential hypertension 12/12/2017   Hypokalemia 12/12/2017   HLD (hyperlipidemia) 05/05/2017   UTI (urinary tract infection) 04/27/2017   Tremor, essential 11/03/2015   Myoclonus 04/16/2015   Abnormality of gait 08/15/2012   Essential and other specified forms of tremor 08/15/2012   Degeneration of lumbar or lumbosacral intervertebral  disc 08/15/2012   Hx of low back pain 08/15/2012   CAD (coronary artery disease) 12/23/2010   Hx of transient ischemic attack (TIA)    CONSTIPATION 02/27/2009   PERSONAL HX COLONIC POLYPS 02/27/2009    Orientation RESPIRATION BLADDER Height & Weight     Self, Time, Situation, Place  O2 (2L nasal cannula) Incontinent, External catheter Weight: 133 lb (60.3 kg) Height:  5\' 2"  (157.5 cm)  BEHAVIORAL SYMPTOMS/MOOD NEUROLOGICAL BOWEL NUTRITION STATUS      Continent Diet (See dc summary)  AMBULATORY STATUS COMMUNICATION OF NEEDS Skin   Limited Assist Verbally Normal                       Personal Care Assistance Level of Assistance  Bathing, Feeding, Dressing Bathing Assistance: Limited assistance Feeding assistance: Limited assistance Dressing Assistance: Limited assistance     Functional Limitations Info             SPECIAL CARE FACTORS FREQUENCY  PT (By licensed PT), OT (By licensed OT)     PT Frequency: 5x/week OT Frequency: 5x/week            Contractures Contractures Info: Not present    Additional Factors Info  Code Status, Allergies Code Status Info: DNR Allergies Info: Allergies: Penicillins, Codeine, Meperidine Hcl, Morphine           Current Medications (11/29/2022):  This is the current hospital active medication list Current Facility-Administered Medications  Medication Dose Route Frequency Provider Last Rate Last Admin   acetaminophen (TYLENOL) tablet 650 mg  650 mg Oral Q6H PRN Howerter, Justin B, DO       Or  acetaminophen (TYLENOL) suppository 650 mg  650 mg Rectal Q6H PRN Howerter, Justin B, DO       acetaminophen (TYLENOL) tablet 1,000 mg  1,000 mg Oral Q8H Rolly Salter, MD   1,000 mg at 11/29/22 0606   cefTRIAXone (ROCEPHIN) 1 g in sodium chloride 0.9 % 100 mL IVPB  1 g Intravenous Q24H Howerter, Justin B, DO 200 mL/hr at 11/29/22 0306 1 g at 11/29/22 0306   fesoterodine (TOVIAZ) tablet 4 mg  4 mg Oral Daily Rolly Salter, MD   4 mg  at 11/28/22 0959   gabapentin (NEURONTIN) capsule 400 mg  400 mg Oral QID Rolly Salter, MD   400 mg at 11/28/22 2155   hydrALAZINE (APRESOLINE) injection 10 mg  10 mg Intravenous Q4H PRN Howerter, Justin B, DO       [START ON 11/30/2022] isosorbide mononitrate (IMDUR) 24 hr tablet 15 mg  15 mg Oral Daily Leroy Sea, MD       lactated ringers infusion   Intravenous Continuous Leroy Sea, MD 100 mL/hr at 11/29/22 0606 New Bag at 11/29/22 0606   levothyroxine (SYNTHROID) tablet 88 mcg  88 mcg Oral QAC breakfast Howerter, Justin B, DO   88 mcg at 11/29/22 0606   melatonin tablet 3 mg  3 mg Oral QHS PRN Howerter, Justin B, DO   3 mg at 11/27/22 2121   metoprolol tartrate (LOPRESSOR) tablet 25 mg  25 mg Oral BID Howerter, Justin B, DO   25 mg at 11/28/22 2155   ondansetron (ZOFRAN) injection 4 mg  4 mg Intravenous Q6H PRN Howerter, Justin B, DO       pantoprazole (PROTONIX) EC tablet 20 mg  20 mg Oral QAC breakfast Howerter, Justin B, DO   20 mg at 11/28/22 0958   polyethylene glycol (MIRALAX / GLYCOLAX) packet 17 g  17 g Oral Daily PRN Rolly Salter, MD       traMADol Janean Sark) tablet 25 mg  25 mg Oral Q6H PRN Rolly Salter, MD       triamcinolone cream (KENALOG) 0.5 % 1 Application  1 Application Topical BID Rolly Salter, MD   1 Application at 11/28/22 2155     Discharge Medications: Please see discharge summary for a list of discharge medications.  Relevant Imaging Results:  Relevant Lab Results:   Additional Information SSN: [id number]  Mearl Latin, LCSW

## 2022-11-29 NOTE — TOC Progression Note (Addendum)
Transition of Care [facility]) - Progression Note    Patient Details  Name: Kimberly Bass MRN: [id number] Date of Birth: 1925-07-07  Transition of [facility]) CM/SW Contact  Mearl Latin, LCSW Phone Number: 11/29/2022, 1:31 PM  Clinical Narrative:    12pm-CSW met with patient son at bedside (patient soundly sleeping). CSW provided SNF bed offers and Medicare ratings list.   1:30pm-CSW received call from son that they would like 1-[facility] ; 2-[facility]. CSW reached out to [facility] for review.  5:15pm-CSW spoke with patient's son and made him aware that [facility] has a semi private room on their long term side that patient can get therapies in and transition to ltc if needed. He would like to move forward with [facility]. CSW submitted for insurance authorization and received approval, Ref# [id number], effective 11/30/2022-12/02/2022.   Expected Discharge Plan: Skilled Nursing Facility Barriers to Discharge: Continued Medical Work up, English as a second language teacher  Expected Discharge Plan and Services In-house Referral: Clinical Social Work   Post Acute Care Choice: Skilled Nursing Facility Living arrangements for the past 2 months: Assisted Living Facility                                       Social Determinants of Health (SDOH) Interventions SDOH Screenings   Utilities: Not At Risk (11/27/2022)  Tobacco Use: Low Risk  (11/27/2022)    Readmission Risk Interventions     No data to display

## 2022-11-30 DIAGNOSIS — I609 Nontraumatic subarachnoid hemorrhage, unspecified: Secondary | ICD-10-CM | POA: Diagnosis not present

## 2022-11-30 MED ORDER — TRAMADOL HCL 50 MG PO TABS: 25.0000 mg | ORAL_TABLET | Freq: Two times a day (BID) | ORAL | 0 refills | Status: AC | PRN

## 2022-11-30 MED ORDER — AMLODIPINE BESYLATE 10 MG PO TABS: 10.0000 mg | ORAL_TABLET | Freq: Every day | ORAL | Status: AC

## 2022-11-30 MED ORDER — AMLODIPINE BESYLATE 10 MG PO TABS
10.0000 mg | ORAL_TABLET | Freq: Every day | ORAL | Status: DC
  Administered 2022-11-30: 10 mg via ORAL
  Filled 2022-11-30: qty 1

## 2022-11-30 MED ORDER — AMLODIPINE BESYLATE 10 MG PO TABS: 5.0000 mg | ORAL_TABLET | Freq: Every day | ORAL | Status: DC

## 2022-11-30 NOTE — Discharge Instructions (Signed)
Follow with Primary MD Merri Brunette, MD in 7 days   Get CBC, CMP  -  checked next visit with your primary MD or SNF MD    Activity: As tolerated with Full fall precautions use walker/cane & assistance as needed  Disposition SNF  Diet: Heart Healthy    Special Instructions: If you have smoked or chewed Tobacco  in the last 2 yrs please stop smoking, stop any regular Alcohol  and or any Recreational drug use.  On your next visit with your primary care physician please Get Medicines reviewed and adjusted.  Please request your Prim.MD to go over all Hospital Tests and Procedure/Radiological results at the follow up, please get all Hospital records sent to your Prim MD by signing hospital release before you go home.  If you experience worsening of your admission symptoms, develop shortness of breath, life threatening emergency, suicidal or homicidal thoughts you must seek medical attention immediately by calling 911 or calling your MD immediately  if symptoms less severe.  You Must read complete instructions/literature along with all the possible adverse reactions/side effects for all the Medicines you take and that have been prescribed to you. Take any new Medicines after you have completely understood and accpet all the possible adverse reactions/side effects.

## 2022-11-30 NOTE — TOC Transition Note (Signed)
Transition of Care [facility]) - CM/SW Discharge Note   Patient Details  Name: Kimberly Bass MRN: [id number] Date of Birth: [birth year]  Transition of Care [facility] CM/SW Contact:  Mearl Latin, LCSW Phone Number: 11/30/2022, 12:49 PM   Clinical Narrative:    Patient will DC to: [facility] Anticipated DC date: 11/30/22 Family notified: Son Transport by: Sharin Mons   Per MD patient ready for DC to [facility]. RN to call report prior to discharge [phone]). RN, patient, patient's family, and facility notified of DC. Discharge Summary and FL2 sent to facility. DC packet on chart including signed DNR and script. Ambulance transport requested for patient.   CSW will sign off for now as social work intervention is no longer needed. Please consult [place] again if new needs arise.     Final next level of care: Skilled Nursing Facility Barriers to Discharge: Barriers Resolved   Patient Goals and CMS Choice CMS Medicare.gov Compare Post Acute Care list provided to:: Patient Represenative (must comment) Choice offered to / list presented to : Adult Children  Discharge Placement     Existing PASRR number confirmed : 11/30/22          Patient chooses bed at: [facility] [place] Patient to be transferred to facility by: [facility] Name of family member notified: Son Patient and family notified of of transfer: 11/30/22  Discharge Plan and Services Additional resources added to the After Visit Summary for   In-house Referral: Clinical Social Work   Post Acute Care Choice: Skilled Nursing Facility                               Social Determinants of Health (SDOH) Interventions SDOH Screenings   Utilities: Not At Risk (11/27/2022)  Tobacco Use: Low Risk  (11/27/2022)     Readmission Risk Interventions     No data to display

## 2022-11-30 NOTE — Progress Notes (Signed)
Physical Therapy Treatment Patient Details Name: Kimberly Bass MRN: [id number] DOB: 1926/02/27 Today's Date: 11/30/2022   History of Present Illness Kimberly Bass is a 89 y.o. female who presents from [facility] after ground-level mechanical fall. CT head showed possible trace subarachnoid hemorrhage in the right frontal lobe. PMHx: essential pretension, hyperlipidemia, acquired hypothyroidism.    PT Comments    Pt tolerated today's session well, focused on BLE strengthening. Pt reports her legs were feeling weak, perform seated BLE exercises to warm up legs prior to standing trials, pt tolerating exercises well with verbal and visual cues for technique, and rest breaks between exercises. Pt able to perform 4 standing trials from recliner with a rest break between each trial, minA needed for balance and cueing for proper technique. Pt ambulated ~3 feet forwards/backwards but declined further mobility due to fatigue. Acute PT will continue to follow up with pt to progress mobility and strength, discharge recommendations remain appropriate.     Recommendations for follow up therapy are one component of a multi-disciplinary discharge planning process, led by the attending physician.  Recommendations may be updated based on patient status, additional functional criteria and insurance authorization.  Follow Up Recommendations  Can patient physically be transported by private vehicle: Yes    Assistance Recommended at Discharge Frequent or constant Supervision/Assistance  Patient can return home with the following A lot of help with walking and/or transfers;A lot of help with bathing/dressing/bathroom;Assistance with cooking/housework;Direct supervision/assist for medications management;Direct supervision/assist for financial management;Assist for transportation;Help with stairs or ramp for entrance   Equipment Recommendations  Other (comment) (tbd at next venue)    Recommendations  for Other Services       Precautions / Restrictions Precautions Precautions: Fall Restrictions Weight Bearing Restrictions: No     Mobility  Bed Mobility               General bed mobility comments: pt seated in recliner upon arrival, ended session in recliner    Transfers Overall transfer level: Needs assistance Equipment used: Rolling walker (2 wheels) Transfers: Sit to/from Stand Sit to Stand: Min assist           General transfer comment: minA to stand from chair x4 trials, cueing for proper hand placement, assist for steady with initial rise    Ambulation/Gait Ambulation/Gait assistance: Min assist Gait Distance (Feet): 3 Feet (forwards/backwards) Assistive device: Rolling walker (2 wheels) Gait Pattern/deviations: Step-to pattern, Decreased step length - right, Decreased step length - left, Decreased stride length, Shuffle, Trunk flexed Gait velocity: decreased     General Gait Details: minA for balance and strength, cueing for technique and mild assist for RW management, pt declining further attempts at ambulation due to fatigue   Stairs             Wheelchair Mobility    Modified Rankin (Stroke Patients Only)       Balance Overall balance assessment: Needs assistance, History of Falls Sitting-balance support: Feet supported, Bilateral upper extremity supported Sitting balance-Leahy Scale: Fair Sitting balance - Comments: BUE support on chair rest   Standing balance support: Bilateral upper extremity supported, Reliant on assistive device for balance, During functional activity Standing balance-Leahy Scale: Poor Standing balance comment: reliant on RW and external support                            Cognition Arousal/Alertness: Awake/alert Behavior During Therapy: WFL for tasks assessed/performed Overall Cognitive Status: History of  cognitive impairments - at baseline                                 General  Comments: Pt with dementia at baseline. Self distracting and requiring cues for task. Pt with short term deficits, standing one trial but later saying she can't complete with second trial attempt        Exercises General Exercises - Lower Extremity Long Arc Quad: AROM, Both, 10 reps, Seated Hip Flexion/Marching: AROM, Both, 10 reps, Seated Toe Raises: AROM, Both, 10 reps, Seated Heel Raises: AROM, Both, 10 reps, Seated    General Comments General comments (skin integrity, edema, etc.): VSS on room air, no family at bedside, Pt reporting swimmyheaded feeling prior to standing attempts, BP 150/79      Pertinent Vitals/Pain Pain Assessment Pain Assessment: Faces Faces Pain Scale: No hurt    Home Living                          Prior Function            PT Goals (current goals can now be found in the care plan section) Acute Rehab PT Goals Patient Stated Goal: get better PT Goal Formulation: With patient Time For Goal Achievement: 12/12/22 Potential to Achieve Goals: Good Progress towards PT goals: Progressing toward goals    Frequency    Min 3X/week      PT Plan Current plan remains appropriate    Co-evaluation              AM-PAC PT "6 Clicks" Mobility   Outcome Measure  Help needed turning from your back to your side while in a flat bed without using bedrails?: A Little Help needed moving from lying on your back to sitting on the side of a flat bed without using bedrails?: A Little Help needed moving to and from a bed to a chair (including a wheelchair)?: A Little Help needed standing up from a chair using your arms (e.g., wheelchair or bedside chair)?: A Little Help needed to walk in hospital room?: Total Help needed climbing 3-5 steps with a railing? : Total 6 Click Score: 14    End of Session Equipment Utilized During Treatment: Gait belt Activity Tolerance: Patient tolerated treatment well Patient left: in chair;with call bell/phone  within reach;with chair alarm set Nurse Communication: Mobility status PT Visit Diagnosis: Unsteadiness on feet (R26.81);Other abnormalities of gait and mobility (R26.89);Muscle weakness (generalized) (M62.81);Difficulty in walking, not elsewhere classified (R26.2);History of falling (Z91.81);Other symptoms and signs involving the nervous system (R29.898)     Time: 2130-8657 PT Time Calculation (min) (ACUTE ONLY): 27 min  Charges:  $Therapeutic Exercise: 8-22 mins $Therapeutic Activity: 8-22 mins                     Lindalou Hose, PT DPT Acute Rehabilitation Services Office [phone]    Leonie Man 11/30/2022, 1:03 PM

## 2022-11-30 NOTE — Discharge Summary (Addendum)
Kimberly Bass XWR:[id number] DOB: 1925/11/09 DOA: 11/26/2022  PCP: Merri Brunette, MD  Admit date: 11/26/2022  Discharge date: 11/30/2022  Admitted From: Home   Disposition:  SNF   Recommendations for Outpatient Follow-up:   Follow up with PCP in 1-2 weeks  PCP Please obtain BMP/CBC, 2 view CXR in 1week,  (see Discharge instructions)   PCP Please follow up on the following pending results:    Home Health: None   Equipment/Devices: None  Consultations: None  Discharge Condition: Stable    CODE STATUS: DNR Diet Recommendation: Heart Healthy   Chief Complaint  Patient presents with   Fall     Brief history of present illness from the day of admission and additional interim summary    89 y.o. female with medical history significant for essential pretension, hyperlipidemia, acquired hypothyroidism, who is admitted to [facility] on 11/26/2022 with potential small subarachnoid hemorrhage after ground-level mechanical fall, after presenting from SNF to [facility] ED for evaluation of fall.                                                                  Hospital Course   Mechanical fall at assisted living with questionable subarachnoid hemorrhage on initial CT brain on 11/27/2022.  Repeat CT scan on 11/27/2022 about 10 hours later was stable, no headache or focal deficits, CT scan was repeated again on 11/28/2022 which remained unremarkable without any signs of bleed, DAPT resumed, continue PT OT at SNF.  Post discharge follow-up with PCP in 7 to 10 days.   Weakness, multiple falls at ALF.  Son now wants SNF placement.  PT OT and TOC consulted.   Acute cystitis.  Treated with Rocephin finished treatment, no acute issues now.   Dyslipidemia.  On statin.   GERD . PPI   Hypothyroidism - on synthroid.   HTN -  metoprolol tartrate, amlodipine, Imdur.    Discharge diagnosis     Principal Problem:   Subarachnoid bleed (HCC) Active Problems:   UTI (urinary tract infection)   HLD (hyperlipidemia)   Acquired hypothyroidism   Essential hypertension   Fall at home, initial encounter   Urinary incontinence   GERD (gastroesophageal reflux disease)   Subarachnoid hemorrhage following injury, no loss of consciousness (HCC)    Discharge instructions    Discharge Instructions     Diet - low sodium heart healthy   Complete by: As directed    Discharge instructions   Complete by: As directed    Follow with Primary MD Merri Brunette, MD in 7 days   Get CBC, CMP  -  checked next visit with your primary MD or SNF MD    Activity: As tolerated with Full fall precautions use walker/cane & assistance as needed  Disposition SNF  Diet: Heart Healthy  Special Instructions: If you have smoked or chewed Tobacco  in the last 2 yrs please stop smoking, stop any regular Alcohol  and or any Recreational drug use.  On your next visit with your primary care physician please Get Medicines reviewed and adjusted.  Please request your Prim.MD to go over all Hospital Tests and Procedure/Radiological results at the follow up, please get all Hospital records sent to your Prim MD by signing hospital release before you go home.  If you experience worsening of your admission symptoms, develop shortness of breath, life threatening emergency, suicidal or homicidal thoughts you must seek medical attention immediately by calling 911 or calling your MD immediately  if symptoms less severe.  You Must read complete instructions/literature along with all the possible adverse reactions/side effects for all the Medicines you take and that have been prescribed to you. Take any new Medicines after you have completely understood and accpet all the possible adverse reactions/side effects.   Increase activity slowly   Complete by:  As directed        Discharge Medications   Allergies as of 11/30/2022       Reactions   Penicillins Shortness Of Breath, Nausea Only, Rash   Codeine Nausea Only   Meperidine Hcl Nausea Only   Morphine Nausea Only        Medication List     TAKE these medications    amLODipine 10 MG tablet Commonly known as: NORVASC Take 1 tablet (10 mg total) by mouth daily. What changed:  medication strength how much to take   aspirin EC 81 MG tablet Take 81 mg by mouth at bedtime.   Calcium 500+D High Potency 500-400 MG-UNIT tablet Generic drug: calcium-vitamin D Take 1 tablet by mouth 2 (two) times daily.   clopidogrel 75 MG tablet Commonly known as: PLAVIX TAKE 1 TABLET BY MOUTH  DAILY   estradiol 0.1 MG/GM vaginal cream Commonly known as: ESTRACE Place 0.5 g vaginally every other day.   gabapentin 400 MG capsule Commonly known as: NEURONTIN TAKE 1 CAPSULE (400 MG TOTAL) BY MOUTH 4 (FOUR) TIMES DAILY.   isosorbide mononitrate 30 MG 24 hr tablet Commonly known as: IMDUR Take 0.5 tablets (15 mg total) by mouth daily.   levothyroxine 88 MCG tablet Commonly known as: SYNTHROID Take 88 mcg by mouth daily before breakfast.   magnesium hydroxide 400 MG/5ML suspension Commonly known as: MILK OF MAGNESIA Take 30 mLs by mouth daily as needed for mild constipation or moderate constipation. If no BM in 3 days, give 30 cc Milk of Magnesium p.o. x 1 dose in 24 hours as needed   metoprolol tartrate 25 MG tablet Commonly known as: LOPRESSOR Take 1 tablet (25 mg total) by mouth 2 (two) times daily.   multivitamin with minerals Tabs tablet Take 1 tablet by mouth daily.   Nitrostat 0.4 MG SL tablet Generic drug: nitroGLYCERIN DISSOLVE 1 TAB UNDER THE  TONGUE EVERY 5 MINUTES AS  NEEDED FOR CHEST PAIN. MAX  3 TOTAL DOSES/15MIN. CALL  911 IF NO RELIEF What changed:  how much to take how to take this when to take this reasons to take this additional instructions   nystatin  powder Commonly known as: MYCOSTATIN/NYSTOP Apply 1 Application topically every 12 (twelve) hours as needed (Rash).   pantoprazole 20 MG tablet Commonly known as: PROTONIX Take 20 mg by mouth daily before breakfast. DO NOT CRUSH   polyethylene glycol 17 g packet Commonly known as: MIRALAX / GLYCOLAX Take 17 g  by mouth daily as needed for mild constipation.   potassium chloride SA 20 MEQ tablet Commonly known as: KLOR-CON M Take 20 mEq by mouth 2 (two) times daily.   PSYLLIUM PO Take 1 capsule by mouth at bedtime.   sennosides-docusate sodium 8.6-50 MG tablet Commonly known as: SENOKOT-S Take 1 tablet by mouth at bedtime.   simvastatin 20 MG tablet Commonly known as: ZOCOR Take 20 mg by mouth at bedtime.   traMADol 50 MG tablet Commonly known as: ULTRAM Take 0.5 tablets (25 mg total) by mouth every 12 (twelve) hours as needed for moderate pain or severe pain.   triamcinolone cream 0.5 % Commonly known as: KENALOG Apply 1 Application topically every 12 (twelve) hours as needed (Lower left leg for rash).   trospium 20 MG tablet Commonly known as: SANCTURA Take 20 mg by mouth 2 (two) times daily.   Tylenol Arthritis Pain 650 MG CR tablet Generic drug: acetaminophen Take 1,300 mg by mouth 2 (two) times a day.         Contact information for follow-up providers     Merri Brunette, MD. Schedule an appointment as soon as possible for a visit in 1 week(s).   Specialty: Internal Medicine Contact information: [address] [place] [address] [place] [address] [phone]              Contact information for after-discharge care     Destination     [facility] [place] Preferred SNF .   Service: Skilled Nursing Contact information: [address] [place] [address] [phone]                     Major procedures and Radiology Reports - PLEASE review detailed and final reports thoroughly  -       CT HEAD  WO CONTRAST ( )  Result Date: 11/28/2022 CLINICAL DATA:  89 year old female status post fall with possible trace subarachnoid hemorrhage on initial head CT. EXAM: CT HEAD WITHOUT CONTRAST TECHNIQUE: Contiguous axial images were obtained from the base of the skull through the vertex without intravenous contrast. RADIATION DOSE REDUCTION: This exam was performed according to the departmental dose-optimization program which includes automated exposure control, adjustment of the mA and/or kV according to patient size and/or use of iterative reconstruction technique. COMPARISON:  Repeat head CT 11/27/2022 and earlier. FINDINGS: Brain: Stable non contrast CT appearance of the brain. No midline shift, mass effect, or evidence of intracranial mass lesion. No ventriculomegaly. No IVH. No intracranial hemorrhage identified. No cortically based acute infarct identified. Small area of chronic left middle frontal gyrus encephalomalacia was present on a 2007 MRI (coronal image 32). Stable gray-white matter differentiation throughout the brain. Vascular: Calcified atherosclerosis at the skull base. Skull: Hyperostosis and TMJ degeneration. No acute osseous abnormality identified. Sinuses/Orbits: Visualized paranasal sinuses and mastoids are stable and well aerated. Other: No acute orbit or scalp soft tissue finding. IMPRESSION: No acute intracranial hemorrhage or acute intracranial abnormality identified. Electronically Signed   By: Odessa Fleming M.D.   On: 11/28/2022 12:47   ECHOCARDIOGRAM COMPLETE  Result Date: 11/27/2022    ECHOCARDIOGRAM REPORT   Patient Name:   DEVI KUJAWA Date of Exam: 11/27/2022 Medical Rec #:  [id number]            Height:       62.0 in Accession #:    [id number]           Weight:       133.0  lb Date of Birth:  07-26-1925             BSA:          1.607 m Patient Age:    89 years             BP:           161/90 mmHg Patient Gender: F                    HR:           101 bpm. Exam Location:  Inpatient  Procedure: 2D Echo, Cardiac Doppler and Color Doppler Indications:    Congestive Heart Failure I50.9  History:        Patient has prior history of Echocardiogram examinations, most                 recent 08/30/2018. CAD and Previous Myocardial Infarction, TIA and                 Stroke, Signs/Symptoms:Altered Mental Status and Shortness of                 Breath; Risk Factors:Hypertension and Dyslipidemia.  Sonographer:    Aron Baba Referring Phys: [id number] [facility] M PATEL  Sonographer Comments: No subcostal window. IMPRESSIONS  1. Left ventricular ejection fraction, by estimation, is 70 to 75%. The left ventricle has hyperdynamic function. The left ventricle has no regional wall motion abnormalities. Left ventricular diastolic parameters are indeterminate.  2. Right ventricular systolic function is normal. The right ventricular size is normal. There is normal pulmonary artery systolic pressure.  3. The mitral valve is normal in structure. No evidence of mitral valve regurgitation. No evidence of mitral stenosis.  4. The aortic valve is normal in structure. There is mild calcification of the aortic valve. There is mild thickening of the aortic valve. Aortic valve regurgitation is not visualized. Aortic valve sclerosis is present, with no evidence of aortic valve stenosis.  5. The inferior vena cava is normal in size with greater than 50% respiratory variability, suggesting right atrial pressure of 3 mmHg. FINDINGS  Left Ventricle: Left ventricular ejection fraction, by estimation, is 70 to 75%. The left ventricle has hyperdynamic function. The left ventricle has no regional wall motion abnormalities. The left ventricular internal cavity size was normal in size. There is no left ventricular hypertrophy. Left ventricular diastolic parameters are indeterminate. Right Ventricle: The right ventricular size is normal. No increase in right ventricular wall thickness. Right ventricular systolic function is normal. There is  normal pulmonary artery systolic pressure. The tricuspid regurgitant velocity is 0.84 m/s, and  with an assumed right atrial pressure of 8 mmHg, the estimated right ventricular systolic pressure is 10.8 mmHg. Left Atrium: Left atrial size was normal in size. Right Atrium: Right atrial size was normal in size. Pericardium: There is no evidence of pericardial effusion. Mitral Valve: The mitral valve is normal in structure. There is mild thickening of the mitral valve leaflet(s). There is mild calcification of the mitral valve leaflet(s). Mild mitral annular calcification. No evidence of mitral valve regurgitation. No evidence of mitral valve stenosis. Tricuspid Valve: The tricuspid valve is normal in structure. Tricuspid valve regurgitation is not demonstrated. No evidence of tricuspid stenosis. Aortic Valve: The aortic valve is normal in structure. There is mild calcification of the aortic valve. There is mild thickening of the aortic valve. Aortic valve regurgitation is not visualized. Aortic valve sclerosis is present, with no evidence of  aortic valve stenosis. Pulmonic Valve: The pulmonic valve was normal in structure. Pulmonic valve regurgitation is not visualized. No evidence of pulmonic stenosis. Aorta: The aortic root is normal in size and structure. Venous: The inferior vena cava is normal in size with greater than 50% respiratory variability, suggesting right atrial pressure of 3 mmHg. IAS/Shunts: No atrial level shunt detected by color flow Doppler.  LEFT VENTRICLE PLAX 2D LVIDd:         2.80 cm   Diastology LVIDs:         2.00 cm   LV e' medial:    10.90 cm/s LV PW:         1.60 cm   LV E/e' medial:  9.3 LV IVS:        0.90 cm   LV e' lateral:   9.32 cm/s LVOT diam:     1.70 cm   LV E/e' lateral: 10.8 LV SV:         37 LV SV Index:   23 LVOT Area:     2.27 cm  RIGHT VENTRICLE RV S prime:     12.00 cm/s TAPSE (M-mode): 1.2 cm LEFT ATRIUM           Index        RIGHT ATRIUM           Index LA diam:       3.50 cm 2.18 cm/m   RA Area:     14.30 cm LA Vol (A4C): 27.3 ml 16.98 ml/m  RA Volume:   36.20 ml  22.52 ml/m  AORTIC VALVE LVOT Vmax:   94.00 cm/s LVOT Vmean:  64.800 cm/s LVOT VTI:    0.161 m  AORTA Ao Root diam: 2.70 cm Ao Asc diam:  3.70 cm MITRAL VALVE                TRICUSPID VALVE MV Area (PHT): 8.72 cm     TR Peak grad:   2.8 mmHg MV Decel Time: 87 msec      TR Vmax:        83.60 cm/s MR Peak grad: 4.8 mmHg MR Vmax:      109.00 cm/s   SHUNTS MV E velocity: 101.00 cm/s  Systemic VTI:  0.16 m MV A velocity: 47.10 cm/s   Systemic Diam: 1.70 cm MV E/A ratio:  2.14 Donato Schultz MD Electronically signed by Donato Schultz MD Signature Date/Time: 11/27/2022/3:40:27 PM    Final    DG THORACOLUMABAR SPINE  Result Date: 11/27/2022 CLINICAL DATA:  Fall.  Back pain. EXAM: THORACOLUMBAR SPINE 1V COMPARISON:  Chest radiographs, 07/24/2015. FINDINGS: No fracture.  No bone lesion.  No spondylolisthesis. Mild curvature, convex the right, apex at T8. Skeletal structures are diffusely demineralized. Mild loss of disc height with endplate osteophytes noted throughout the thoracic spine. Soft tissues are unremarkable. IMPRESSION: 1. No fracture or acute finding. Electronically Signed   By: Amie Portland M.D.   On: 11/27/2022 13:22   DG Ribs Unilateral Left  Result Date: 11/27/2022 CLINICAL DATA:  Fall.  Left-sided chest pain. EXAM: LEFT RIBS - 2 VIEW COMPARISON:  11/26/2022. FINDINGS: No fracture or other bone lesions are seen involving the ribs. IMPRESSION: Negative. Electronically Signed   By: Amie Portland M.D.   On: 11/27/2022 13:20   CT HEAD WO CONTRAST ( )  Result Date: 11/27/2022 CLINICAL DATA:  89 year old female status post fall with trace subarachnoid hemorrhage suspected in the right frontal lobe. EXAM: CT HEAD WITHOUT CONTRAST TECHNIQUE: Contiguous  axial images were obtained from the base of the skull through the vertex without intravenous contrast. RADIATION DOSE REDUCTION: This exam was performed according  to the departmental dose-optimization program which includes automated exposure control, adjustment of the mA and/or kV according to patient size and/or use of iterative reconstruction technique. COMPARISON:  Head CT yesterday. FINDINGS: Brain: No midline shift, mass effect, or evidence of intracranial mass lesion. No ventriculomegaly. No intraventricular hemorrhage. No convincing extra-axial blood or acute intracranial hemorrhage on these images. Stable gray-white matter differentiation throughout the brain. Small area of chronic appearing encephalomalacia left middle frontal gyrus (coronal image 33). No convincing acute cortically based infarct. Vascular: Calcified atherosclerosis at the skull base. No suspicious intracranial vascular hyperdensity. Skull: TMJ degeneration.  No acute osseous abnormality identified. Sinuses/Orbits: Visualized paranasal sinuses and mastoids are stable and well aerated. Other: Stable orbit and scalp soft tissues. IMPRESSION: 1. No convincing intracranial hemorrhage on this exam. 2. No acute intracranial abnormality. Small area of chronic encephalomalacia in the left middle frontal gyrus. Electronically Signed   By: Odessa Fleming M.D.   On: 11/27/2022 06:48   CT HEAD WO CONTRAST ( )  Result Date: 11/26/2022 CLINICAL DATA:  Fall EXAM: CT HEAD WITHOUT CONTRAST TECHNIQUE: Contiguous axial images were obtained from the base of the skull through the vertex without intravenous contrast. RADIATION DOSE REDUCTION: This exam was performed according to the departmental dose-optimization program which includes automated exposure control, adjustment of the mA and/or kV according to patient size and/or use of iterative reconstruction technique. COMPARISON:  04/27/2017 FINDINGS: Brain: Possible trace subarachnoid hemorrhage in the right frontal lobe (series 3, image 23). No evidence of parenchymal or subdural hemorrhage. No evidence of acute infarct, mass, mass effect, or midline shift. No  hydrocephalus. Vascular: No hyperdense vessel. Atherosclerotic calcifications in the intracranial carotid and vertebral arteries. Skull: Normal. Negative for fracture or focal lesion. Sinuses/Orbits: No acute finding. Other: The mastoid air cells are well aerated. IMPRESSION: Possible trace subarachnoid hemorrhage in the right frontal lobe. No mass effect. These results were called by telephone at the time of interpretation on 11/26/2022 at 11:48 pm to provider [facility], who verbally acknowledged these results. Electronically Signed   By: Wiliam Ke M.D.   On: 11/26/2022 23:48   CT Cervical Spine Wo Contrast  Result Date: 11/26/2022 CLINICAL DATA:  Neck trauma (Age >= 65y) Fall in the shower. EXAM: CT CERVICAL SPINE WITHOUT CONTRAST TECHNIQUE: Multidetector CT imaging of the cervical spine was performed without intravenous contrast. Multiplanar CT image reconstructions were also generated. RADIATION DOSE REDUCTION: This exam was performed according to the departmental dose-optimization program which includes automated exposure control, adjustment of the mA and/or kV according to patient size and/or use of iterative reconstruction technique. COMPARISON:  CT cervical spine 08/28/2018 FINDINGS: Alignment: Normal. Skull base and vertebrae: Anterior fusion C5 through C7 with interbody spacers. Intact hardware. No acute fracture. The dens and skull base are intact. Moderate C1-C2 degenerative change. Soft tissues and spinal canal: No prevertebral fluid or swelling. No visible canal hematoma. Disc levels: C3-C4 and C4-C5 disc space narrowing and spurring. Posterior spurring causes mass effect on the spinal canal, similar to prior exam. There is also posterior spurring at C6-C7 causing spinal canal narrowing. Moderate multilevel facet hypertrophy. Upper chest: No acute or unexpected findings. Other: Carotid calcifications. IMPRESSION: 1. No acute fracture or subluxation of the cervical spine. 2. Degenerative and  postsurgical change. Electronically Signed   By: Narda Rutherford M.D.   On: 11/26/2022 23:22   DG  Chest Port 1 View  Result Date: 11/26/2022 CLINICAL DATA:  Fall. EXAM: PORTABLE CHEST 1 VIEW COMPARISON:  12/10/2018 FINDINGS: Shallow inspiration. Heart size and pulmonary vascularity are normal. Blunting of the left costophrenic angles suggesting a small left pleural effusion. Lungs are clear. Calcification of the aorta. Degenerative changes in the spine. Postoperative changes in the cervical spine. IMPRESSION: Small left pleural effusion.  No focal consolidation. Electronically Signed   By: Burman Nieves M.D.   On: 11/26/2022 23:10   DG Pelvis Portable  Result Date: 11/26/2022 CLINICAL DATA:  Fall yesterday.  Left hip pain. EXAM: PORTABLE PELVIS 1-2 VIEWS COMPARISON:  None Available. FINDINGS: Degenerative changes in the spine and hips. No evidence of acute fracture or dislocation. No focal bone lesion or bone destruction. SI joints and symphysis pubis are not displaced. Soft tissues are unremarkable. IMPRESSION: Degenerative changes in the hips.  No acute bony abnormalities. Electronically Signed   By: Burman Nieves M.D.   On: 11/26/2022 23:09    Micro Results    No results found for this or any previous visit (from the past 240 hour(s)).  Today   Subjective    Kimberly Bass today has no headache,no chest abdominal pain,no new weakness tingling or numbness, feels much better wants to go home today.    Objective   Blood pressure (!) 153/69, pulse 75, temperature 98.1 F (36.7 C), temperature source Oral, resp. rate 18, height 5\' 2"  (1.575 m), weight 60.3 kg, SpO2 92 %.   Intake/Output Summary (Last 24 hours) at 11/30/2022 1612 Last data filed at 11/30/2022 1200 Gross per 24 hour  Intake 240 ml  Output 900 ml  Net -660 ml    Exam  Awake Alert, No new F.N deficits,    .AT,PERRAL Supple Neck,   Symmetrical Chest wall movement, Good air movement bilaterally, CTAB RRR,No  Gallops,   +ve B.Sounds, Abd Soft, Non tender,  No Cyanosis, Clubbing or edema    Data Review   Recent Labs  Lab 11/26/22 2245 11/26/22 2258 11/27/22 0206  WBC 7.7  --  7.6  HGB 13.4 13.9 12.9  HCT 40.8 41.0 39.3  PLT 124*  --  123*  MCV 87.0  --  86.8  MCH 28.6  --  28.5  MCHC 32.8  --  32.8  RDW 14.4  --  14.3  LYMPHSABS 1.0  --  1.3  MONOABS 0.7  --  0.8  EOSABS 0.1  --  0.1  BASOSABS 0.0  --  0.0    Recent Labs  Lab 11/26/22 2245 11/26/22 2258 11/27/22 0206 11/28/22 0642  NA  --  135 134* 135  K  --  3.8 3.7 3.7  CL  --  99 102 96*  CO2  --   --  22 25  ANIONGAP  --   --  10 14  GLUCOSE  --  117* 111* 111*  BUN  --  11 8 9   CREATININE  --  0.70 0.71 0.96  AST  --   --  24  --   ALT  --   --  21  --   ALKPHOS  --   --  45  --   BILITOT  --   --  0.9  --   ALBUMIN  --   --  3.3*  --   INR 1.0  --   --   --   MG  --   --  1.5* 1.7  CALCIUM  --   --  8.8* 8.8*    Total Time in preparing paper work, data evaluation and todays exam - 35 minutes  Signature  -    Susa Raring M.D on 11/30/2022 at 4:12 PM   -  To page go to [email or web address]
